# Patient Record
Sex: Female | Born: 1950 | Race: White | Hispanic: No | Marital: Single | Smoking: Former smoker
Health system: Southern US, Community
[De-identification: ages and names within clinical notes are randomized; demographics above are authoritative.]

## PROBLEM LIST (undated history)

## (undated) DIAGNOSIS — R112 Nausea with vomiting, unspecified: Secondary | ICD-10-CM

## (undated) DIAGNOSIS — K219 Gastro-esophageal reflux disease without esophagitis: Secondary | ICD-10-CM

## (undated) DIAGNOSIS — I671 Cerebral aneurysm, nonruptured: Secondary | ICD-10-CM

## (undated) DIAGNOSIS — M310 Hypersensitivity angiitis: Secondary | ICD-10-CM

## (undated) DIAGNOSIS — G510 Bell's palsy: Secondary | ICD-10-CM

## (undated) DIAGNOSIS — N186 End stage renal disease: Secondary | ICD-10-CM

## (undated) DIAGNOSIS — I10 Essential (primary) hypertension: Secondary | ICD-10-CM

## (undated) DIAGNOSIS — E039 Hypothyroidism, unspecified: Secondary | ICD-10-CM

## (undated) DIAGNOSIS — E079 Disorder of thyroid, unspecified: Secondary | ICD-10-CM

## (undated) DIAGNOSIS — M1990 Unspecified osteoarthritis, unspecified site: Secondary | ICD-10-CM

## (undated) DIAGNOSIS — S52502A Unspecified fracture of the lower end of left radius, initial encounter for closed fracture: Secondary | ICD-10-CM

## (undated) DIAGNOSIS — Z9889 Other specified postprocedural states: Secondary | ICD-10-CM

## (undated) HISTORY — PX: EYE SURGERY: SHX253

## (undated) HISTORY — PX: FOOT FRACTURE SURGERY: SHX645

## (undated) HISTORY — PX: BILATERAL CARPAL TUNNEL RELEASE: SHX6508

## (undated) HISTORY — PX: WISDOM TOOTH EXTRACTION: SHX21

## (undated) HISTORY — PX: TONSILLECTOMY: SUR1361

## (undated) HISTORY — DX: Essential (primary) hypertension: I10

## (undated) HISTORY — PX: BUNIONECTOMY: SHX129

## (undated) HISTORY — DX: Disorder of thyroid, unspecified: E07.9

## (undated) HISTORY — PX: BREAST SURGERY: SHX581

## (undated) HISTORY — PX: THYROIDECTOMY: SHX17

## (undated) NOTE — *Deleted (*Deleted)
  NEUROSURGERY PROGRESS NOTE   Patient is currently in HD. MRI/MRA reviewed with Dr Kathyrn Sheriff. MRI without evidence of intracranial hemorrhage. 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 Given lack of changes on MRA, do not believe her unruptured aneurysms

---

## 1997-09-10 ENCOUNTER — Ambulatory Visit (HOSPITAL_COMMUNITY): Admission: RE | Admit: 1997-09-10 | Discharge: 1997-09-10 | Payer: Self-pay | Admitting: *Deleted

## 2002-03-12 ENCOUNTER — Ambulatory Visit (HOSPITAL_BASED_OUTPATIENT_CLINIC_OR_DEPARTMENT_OTHER): Admission: RE | Admit: 2002-03-12 | Discharge: 2002-03-12 | Payer: Self-pay | Admitting: *Deleted

## 2003-03-08 ENCOUNTER — Other Ambulatory Visit: Admission: RE | Admit: 2003-03-08 | Discharge: 2003-03-08 | Payer: Self-pay | Admitting: Geriatric Medicine

## 2003-03-11 ENCOUNTER — Encounter: Admission: RE | Admit: 2003-03-11 | Discharge: 2003-03-11 | Payer: Self-pay | Admitting: Otolaryngology

## 2005-05-26 ENCOUNTER — Other Ambulatory Visit: Admission: RE | Admit: 2005-05-26 | Discharge: 2005-05-26 | Payer: Self-pay | Admitting: Geriatric Medicine

## 2006-02-10 ENCOUNTER — Encounter: Admission: RE | Admit: 2006-02-10 | Discharge: 2006-02-10 | Payer: Self-pay | Admitting: Geriatric Medicine

## 2009-06-26 ENCOUNTER — Other Ambulatory Visit: Admission: RE | Admit: 2009-06-26 | Discharge: 2009-06-26 | Payer: Self-pay | Admitting: Geriatric Medicine

## 2010-01-24 ENCOUNTER — Encounter: Payer: Self-pay | Admitting: Otolaryngology

## 2010-05-20 ENCOUNTER — Other Ambulatory Visit: Payer: Self-pay | Admitting: Geriatric Medicine

## 2010-05-20 ENCOUNTER — Ambulatory Visit
Admission: RE | Admit: 2010-05-20 | Discharge: 2010-05-20 | Disposition: A | Discharge status: Home / Self Care | Payer: BC Managed Care – PPO | Source: Ambulatory Visit | Attending: Geriatric Medicine | Admitting: Geriatric Medicine

## 2010-05-20 DIAGNOSIS — M549 Dorsalgia, unspecified: Secondary | ICD-10-CM

## 2010-05-22 NOTE — Op Note (Signed)
   NAME:  Cristina Wilson, Cristina Wilson                        ACCOUNT NO.:  [id number]   MEDICAL RECORD NO.:  [id number]                   PATIENT TYPE:  AMB   LOCATION:  [place]                                  FACILITY:  [facility]   PHYSICIAN:  Marcello Moores B. March Rummage, M.D.               DATE OF BIRTH:  05/14/1950   DATE OF PROCEDURE:  03/12/2002  DATE OF DISCHARGE:                                 OPERATIVE REPORT   PREOPERATIVE DIAGNOSIS:  Skin lesion, right upper arm.   POSTOPERATIVE DIAGNOSIS:  Skin lesion, right upper arm.   PROCEDURE:  Excision of skin lesion, right upper arm.   SURGEON:  Marcello Moores B. March Rummage, M.D.   ANESTHESIA:  1% Xylocaine local.   PROCEDURE IN DETAIL:  The patient was taken to the minor surgery room where  the right upper arm was prepped and draped in a sterile field.  The patient  had a skin lesion which was nonhealing and seems to be enlarging.  An  elliptical incision was outlined with a skin marker.  Then the 1% Xylocaine  was used to anesthetize the area.  The elliptical incision was made going  down through the full thickness of the skin and removing the underlying fat.  The defect was 1.2 x 2.2 cm.  The wound was then closed with interrupted  sutures of 3-0 Vicryl as subcuticular sutures, and the skin was closed with  interrupted vertical mattress sutures of 4-0 nylon.  A dry sterile dressing  was applied.  The patient seemed to tolerate the procedure well.  The  specimen was sent for routine pathologic results.                                               Thomas B. March Rummage, M.D.    TBP/MEDQ  D:  03/12/2002  T:  03/12/2002  Job:  [id number]

## 2011-08-08 ENCOUNTER — Ambulatory Visit (INDEPENDENT_AMBULATORY_CARE_PROVIDER_SITE_OTHER): Payer: BC Managed Care – PPO | Admitting: Family Medicine

## 2011-08-08 ENCOUNTER — Ambulatory Visit: Payer: BC Managed Care – PPO

## 2011-08-08 VITALS — BP 130/72 | HR 66 | Temp 97.4°F | Resp 12 | Ht 64.25 in | Wt 177.0 lb

## 2011-08-08 DIAGNOSIS — M79609 Pain in unspecified limb: Secondary | ICD-10-CM

## 2011-08-08 DIAGNOSIS — M79673 Pain in unspecified foot: Secondary | ICD-10-CM

## 2011-08-08 NOTE — Progress Notes (Signed)
[facility] [address] [place] [address] [phone]  Date:  08/08/2011   Name:  Cristina Wilson   DOB:  08-29-1950   MRN:  [id number]  PCP:  Mathews Argyle, MD    Chief Complaint: Foot Pain   History of Present Illness:  Cristina Wilson is a 61 y.o. very pleasant female patient who presents with the following:  She was going down some stairs and missed a step- this occurred about 10 days ago.  She rolled over the toes of her right foot, and it turned black and blue and swelled up. She was not able to bear weight at all for a couple of days. She applied ice and took aleve.   She still has pain, and wanted to be sure there is no break.  She did try a post- op shoe that she had at home- it felt good to her foot. She did have bunion surgery several years ago.   Otherwise she is not hurt and has not other complaints today  There is no problem list on file for this patient.   No past medical history on file.  No past surgical history on file.  History  Substance Use Topics  . Smoking status: Former Smoker -- .5 years    Types: Cigarettes    Quit date: 08/08/1991  . Smokeless tobacco: Never Used  . Alcohol Use: Not on file    No family history on file.  Allergies  Allergen Reactions  . Sulfa Antibiotics Rash    Medication list has been reviewed and updated.  Current Outpatient Prescriptions on File Prior to Visit  Medication Sig Dispense Refill  . hydrochlorothiazide (MICROZIDE) 12.5 MG capsule Take 12.5 mg by mouth daily.      Marland Kitchen levothyroxine (SYNTHROID, LEVOTHROID) 112 MCG tablet Take 112 mcg by mouth daily.      Marland Kitchen lisinopril (PRINIVIL,ZESTRIL) 5 MG tablet Take 5 mg by mouth daily.        Review of Systems:  As per HPI- otherwise negative.   Physical Examination: Filed Vitals:   08/08/11 1049  BP: 130/72  Pulse: 66  Temp: 97.4 F (36.3 C)  Resp: 12   Filed Vitals:   08/08/11 1049  Height: 5' 4.25" (1.632 m)  Weight:  177 lb (80.287 kg)   Body mass index is 30.15 kg/(m^2). Ideal Body Weight: Weight in (lb) to have BMI = 25: 146.5    GEN: WDWN, NAD, Non-toxic, Alert & Oriented x 3 HEENT: Atraumatic, Normocephalic.  Ears and Nose: No external deformity. EXTR: No clubbing/cyanosis/edema NEURO: slight limp PSYCH: Normally interactive. Conversant. Not depressed or anxious appearing.  Calm demeanor.  Right foot and ankle:  There is tenderness at distal 4th and 3rd MT near her toes, and inferior to her lateral malleolus.  No bruise or swelling, no heat or redness.    [facility] reading (PRIMARY) by  Dr. Lorelei Pont. Right foot:  Evidence of old internal fixation- otherwise normal Right ankle: negative for fracture  RIGHT ANKLE - COMPLETE 3+ VIEW  Comparison: No priors.  Findings: Three views of the right ankle demonstrate no acute fracture, subluxation, dislocation, joint or soft tissue abnormality. Orthopedic fixation hardware in the distal first and fifth metatarsals are incidentally noted.  IMPRESSION: 1. No acute radiographic abnormality of the right ankle to account for the patient's symptoms.  RIGHT FOOT COMPLETE - 3+ VIEW  Comparison: No priors.  Findings: Postoperative changes of ORIF for noted in the distal first and fifth  metatarsals. No acute displaced fracture, subluxation, dislocation, joint or soft tissue abnormality is appreciated. No lucency around the K-wire in the distal first metatarsal. There is a small amount of lucency adjacent to the head of the screw in the fifth metatarsal.  IMPRESSION: 1. No definite acute radiographic abnormality of the right foot to account for the patient's symptoms. 2. However, there is a small amount of lucency around the head of the screw in the distal fifth metatarsal. Correlation with any prior outside radiographs is recommended. If the patient has point tenderness in this region, this could conceivably represent evidence of infection. Clinical  correlation is recommended.  Assessment and Plan: 1. Pain in foot  DG Ankle Complete Right, DG Foot Complete Right   No apparent fracture- Wear post- op shoe for comfort.  Let me know if not better within a week or so- Sooner if worse.   Received over- read report and called her to discuss item #2 in foot report above.  Her bunion surgery was done by her podiatrist at the [facility].  It is unlikely that she has any acute infection as her symptoms are explained by her fall.  However, I did ask her to have her podiatrist compare her new x-rays with their old films- I do not have access to her old films.  Make her a CD of her films and a print- out of her reports.  She will pick these up and have her podiatrist compare her x-rays this week,      Horice Carrero, MD

## 2012-12-08 ENCOUNTER — Ambulatory Visit: Payer: BC Managed Care – PPO

## 2012-12-26 ENCOUNTER — Ambulatory Visit (INDEPENDENT_AMBULATORY_CARE_PROVIDER_SITE_OTHER): Payer: BC Managed Care – PPO

## 2012-12-26 VITALS — BP 102/61 | HR 59 | Resp 16 | Ht 64.0 in | Wt 180.0 lb

## 2012-12-26 DIAGNOSIS — M79609 Pain in unspecified limb: Secondary | ICD-10-CM

## 2012-12-26 DIAGNOSIS — M79673 Pain in unspecified foot: Secondary | ICD-10-CM

## 2012-12-26 DIAGNOSIS — M722 Plantar fascial fibromatosis: Secondary | ICD-10-CM

## 2012-12-26 MED ORDER — MELOXICAM 15 MG PO TABS: 15.0000 mg | ORAL_TABLET | Freq: Every day | ORAL | Status: DC

## 2012-12-26 NOTE — Patient Instructions (Signed)

## 2012-12-26 NOTE — Progress Notes (Signed)
   Subjective:    Patient ID: Cristina Wilson, female    DOB: Oct 12, 1950, 62 y.o.   MRN: [id number]  "My heels have been bothering me since July, more so the right one than the left."  Foot Pain This is a new (Heel Pain B/L Painful) problem. Episode onset:  July. The problem occurs intermittently. Progression since onset: eased up a little bit. Associated symptoms include myalgias. The symptoms are aggravated by walking (barefoot). She has tried heat, ice and NSAIDs (TENS Unit, stopped walking, Naprosyn, massage therapy,) for the symptoms. The treatment provided mild relief.      Review of Systems  Musculoskeletal: Positive for gait problem and myalgias.  All other systems reviewed and are negative.       Objective:   Physical Exam Neurovascular status is intact. Pedal pulses palpable. Epicritic and proprioceptive sensations intact and symmetric bilateral. Orthopedic biomechanical exam reveals pain on palpation Magan plantar fascia medial trochanter tubercle right heel more significant than left. There is pain on for step in the morning or getting up and after a period of rest. Cannot walk barefoot. Patient currently wears and Finn comfort insoles and uses [employer]. She has had custom orthoses in the past has been doing physical therapy ice stretching massage therapy and prior treatments with temporary relief. Dermatologically findings unremarkable x-rays reveal old fixations from bunion and hammertoe repair right foot and  Jones fracture repair left foot.       Assessment & Plan:  Assessment this time is recalcitrant or recurrent plantar fasciitis/heel spur syndrome right foot more so than left. Plantar fascial strapping applied to both feet at this time also prescription for Mobic 15 mg daily as alternative to naproxen. Patient will followup in 2-3 weeks for reevaluation maintain orthotics recommended crocs for around the house also maintain ice pack as needed may be  candidate for prescription orthoses in the future. Followup visit patient is to bring her orthotics in for evaluation to  Harriet Masson DP

## 2013-01-16 ENCOUNTER — Ambulatory Visit: Payer: BC Managed Care – PPO

## 2013-12-10 ENCOUNTER — Other Ambulatory Visit: Payer: Self-pay | Admitting: Orthopedic Surgery

## 2013-12-19 ENCOUNTER — Other Ambulatory Visit: Payer: Self-pay | Admitting: Dermatology

## 2014-01-10 ENCOUNTER — Encounter: Payer: Self-pay | Admitting: Podiatry

## 2014-01-10 ENCOUNTER — Ambulatory Visit (INDEPENDENT_AMBULATORY_CARE_PROVIDER_SITE_OTHER): Payer: BC Managed Care – PPO | Admitting: Podiatry

## 2014-01-10 VITALS — BP 155/86 | HR 63 | Resp 16

## 2014-01-10 DIAGNOSIS — M722 Plantar fascial fibromatosis: Secondary | ICD-10-CM

## 2014-01-10 MED ORDER — TRIAMCINOLONE ACETONIDE 10 MG/ML IJ SUSP
10.0000 mg | Freq: Once | INTRAMUSCULAR | Status: AC
  Administered 2014-01-10: 10 mg

## 2014-01-11 NOTE — Progress Notes (Signed)
Subjective:     Patient ID: Cristina Wilson, female   DOB: March 31, 1950, 64 y.o.   MRN: [id number]  HPI patient states she continues to have pain in both heels with the left one bothering her more and it has been going on for a while   Review of Systems     Objective:   Physical Exam  Vascular status intact with muscle strength adequate range of motion within normal limits with discomfort in the plantar heels which is quite sore at the medial band insertion left over right    Assessment:     Plantar fasciitis left over right heel    Plan:     Advised on physical therapy supportive shoe gear and today injected the plantar heel bilateral 3 mg Kenalog 5 mg Xylocaine and applied fascially brace to the left heel with instructions is on usage

## 2014-01-17 ENCOUNTER — Ambulatory Visit (INDEPENDENT_AMBULATORY_CARE_PROVIDER_SITE_OTHER): Payer: BC Managed Care – PPO | Admitting: Podiatry

## 2014-01-17 ENCOUNTER — Encounter: Payer: Self-pay | Admitting: Podiatry

## 2014-01-17 VITALS — BP 119/66 | HR 61 | Resp 16

## 2014-01-17 DIAGNOSIS — M722 Plantar fascial fibromatosis: Secondary | ICD-10-CM

## 2014-01-17 NOTE — Progress Notes (Signed)
Subjective:     Patient ID: Cristina Wilson, female   DOB: April 01, 1950, 64 y.o.   MRN: [id number]  HPI patient states that my heels are feeling much better with the injections but I'm still having pain right over left and it has been going on a long time   Review of Systems     Objective:   Physical Exam Neurovascular status intact with muscle strength adequate and range of motion within normal limits. Continues to have moderate discomfort when the plantar heel right is pressed with mild discomfort on the left and does have mechanical dysfunction function with moderate depression of the arch upon weightbearing    Assessment:     Chronic plantar fasciitis still noted with depression of the arch is part of the pathological process    Plan:     Advised on physical therapy and at this time casted and scanned for custom orthotics to reduce the long-term stress on the heels. Discussed shoe gear modifications and reappoint when orthotics are returned

## 2014-02-11 ENCOUNTER — Other Ambulatory Visit: Payer: BC Managed Care – PPO

## 2014-02-14 ENCOUNTER — Ambulatory Visit (INDEPENDENT_AMBULATORY_CARE_PROVIDER_SITE_OTHER): Payer: BC Managed Care – PPO | Admitting: *Deleted

## 2014-02-14 DIAGNOSIS — M722 Plantar fascial fibromatosis: Secondary | ICD-10-CM

## 2014-02-14 NOTE — Patient Instructions (Signed)

## 2014-02-14 NOTE — Progress Notes (Signed)
PICKING UP MY INSERTS

## 2014-04-05 ENCOUNTER — Other Ambulatory Visit (HOSPITAL_COMMUNITY)
Admission: RE | Admit: 2014-04-05 | Discharge: 2014-04-05 | Disposition: A | Discharge status: Home / Self Care | Payer: BC Managed Care – PPO | Source: Ambulatory Visit | Attending: Geriatric Medicine | Admitting: Geriatric Medicine

## 2014-04-05 ENCOUNTER — Other Ambulatory Visit: Payer: Self-pay | Admitting: Geriatric Medicine

## 2014-04-05 DIAGNOSIS — Z1151 Encounter for screening for human papillomavirus (HPV): Secondary | ICD-10-CM | POA: Diagnosis present

## 2014-04-05 DIAGNOSIS — Z01419 Encounter for gynecological examination (general) (routine) without abnormal findings: Secondary | ICD-10-CM | POA: Diagnosis not present

## 2014-04-09 LAB — CYTOLOGY - PAP

## 2014-08-05 DIAGNOSIS — I671 Cerebral aneurysm, nonruptured: Secondary | ICD-10-CM

## 2014-08-05 HISTORY — DX: Cerebral aneurysm, nonruptured: I67.1

## 2014-08-07 ENCOUNTER — Encounter (HOSPITAL_COMMUNITY): Payer: BC Managed Care – PPO

## 2014-08-07 ENCOUNTER — Other Ambulatory Visit (HOSPITAL_COMMUNITY): Payer: Self-pay | Admitting: Geriatric Medicine

## 2014-08-07 ENCOUNTER — Other Ambulatory Visit (HOSPITAL_COMMUNITY): Payer: Self-pay | Admitting: Interventional Radiology

## 2014-08-07 DIAGNOSIS — R51 Headache: Principal | ICD-10-CM

## 2014-08-07 DIAGNOSIS — I671 Cerebral aneurysm, nonruptured: Secondary | ICD-10-CM

## 2014-08-07 DIAGNOSIS — R519 Headache, unspecified: Secondary | ICD-10-CM

## 2014-08-07 DIAGNOSIS — I639 Cerebral infarction, unspecified: Secondary | ICD-10-CM

## 2014-08-09 ENCOUNTER — Ambulatory Visit (HOSPITAL_COMMUNITY)
Admission: RE | Admit: 2014-08-09 | Discharge: 2014-08-09 | Disposition: A | Discharge status: Home / Self Care | Payer: BC Managed Care – PPO | Source: Ambulatory Visit | Attending: Interventional Radiology | Admitting: Interventional Radiology

## 2014-08-09 DIAGNOSIS — I639 Cerebral infarction, unspecified: Secondary | ICD-10-CM

## 2014-08-09 DIAGNOSIS — I671 Cerebral aneurysm, nonruptured: Secondary | ICD-10-CM

## 2014-08-12 ENCOUNTER — Other Ambulatory Visit (HOSPITAL_COMMUNITY): Payer: Self-pay | Admitting: Interventional Radiology

## 2014-08-12 ENCOUNTER — Telehealth (HOSPITAL_COMMUNITY): Payer: Self-pay | Admitting: Interventional Radiology

## 2014-08-12 DIAGNOSIS — I671 Cerebral aneurysm, nonruptured: Secondary | ICD-10-CM

## 2014-08-12 NOTE — Telephone Encounter (Signed)
Spoke to pt this morning concerning her prescription for her Plavix 75mg  given to her by Dr. Estanislado Pandy at her consult on Friday 08/09/14. She stated that he did NOT write the medication name on the prescription and the pharmacy was trying to reach our office to have this corrected. The patient was crying and yelling hysterically over this. I spoke with her at great length and assured her that we would contact her pharmacy and fix this error ASAP. The pharmacy was called and I spoke directly to the pharmacist and asked him what the problem was with the patient's prescription. He stated that the prescription that was brought in did not have a medication name on it at all but only said take PO b.i.d. That too was incorrect information. I explained to him that the patient's dictation from her consultation visit and the doctor's hand written notes from her visit states that she is to start Plavix 75mg  1 tablet daily x7 days prior to her scheduled procedure. I asked the pharmacist to fax me a copy of the written script so that I could speak to Deveshwar about it, which he did fax over. I called that patient back after talking w/ the pharmacist and let her know that her medication confusion had been cleared up and she would be able to pick her medication up today. She stated understanding and agreement with this. She will come back in for another consultation (per her request) on 09/02/14. JM

## 2014-08-22 ENCOUNTER — Ambulatory Visit: Payer: Self-pay

## 2014-08-22 ENCOUNTER — Ambulatory Visit (INDEPENDENT_AMBULATORY_CARE_PROVIDER_SITE_OTHER): Payer: BC Managed Care – PPO | Admitting: Podiatry

## 2014-08-22 ENCOUNTER — Encounter: Payer: Self-pay | Admitting: Podiatry

## 2014-08-22 VITALS — BP 124/67 | HR 58 | Resp 16

## 2014-08-22 DIAGNOSIS — M79671 Pain in right foot: Secondary | ICD-10-CM | POA: Diagnosis not present

## 2014-08-22 DIAGNOSIS — M722 Plantar fascial fibromatosis: Secondary | ICD-10-CM

## 2014-08-22 DIAGNOSIS — Z0189 Encounter for other specified special examinations: Secondary | ICD-10-CM

## 2014-08-22 NOTE — Progress Notes (Signed)
Subjective:     Patient ID: Cristina Wilson, female   DOB: 26-Jul-1950, 64 y.o.   MRN: [id number]  HPI patient states that I have had a reoccurrence of my heel pain and I'm getting some numbness on my foot the top right over left. 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 Ellene Route to discuss that is due to have it done at the radiologist office   Review of Systems     Objective:   Physical Exam Neurovascular status intact with no change in health history and noted to have discomfort in the plantar aspect of the right over left heel with inflammation and mild numbness into the arch right over left and dorsum of the foot right over left with no specific pattern    Assessment:     Reoccurrence of significant plantar fascial symptomatology right along with possible issues with her back which may be causing numbing-like sensations    Plan:     X-rays of both feet were reviewed discussing the symptoms she is experiencing and I went ahead and discussed long-term shockwave for the right heel which I think would be of benefit. Patient wants to do this but needs to hold off and I dispensed a air fracture walker to use now and also to use for after shockwave. Gave instructions on how to use it and also discussed that she talk to Dr. Ellene Route concerning her numbness

## 2014-09-02 ENCOUNTER — Telehealth (HOSPITAL_COMMUNITY): Payer: Self-pay | Admitting: Interventional Radiology

## 2014-09-02 ENCOUNTER — Other Ambulatory Visit (HOSPITAL_COMMUNITY): Payer: BC Managed Care – PPO

## 2014-09-02 NOTE — Telephone Encounter (Signed)
Called pt, left VM asking her to call me back. I told her that I would like to discuss, if she did not mind, the reason she called and canceled her upcoming intervention. Told her that we certainly hated that she was feeling the way she stated that she was feeling. JM

## 2014-09-11 ENCOUNTER — Ambulatory Visit (HOSPITAL_COMMUNITY): Admission: RE | Admit: 2014-09-11 | Payer: BC Managed Care – PPO | Source: Ambulatory Visit

## 2014-09-13 ENCOUNTER — Encounter: Payer: Self-pay | Admitting: Podiatry

## 2014-09-13 ENCOUNTER — Ambulatory Visit (INDEPENDENT_AMBULATORY_CARE_PROVIDER_SITE_OTHER): Payer: BC Managed Care – PPO | Admitting: Podiatry

## 2014-09-13 VITALS — BP 124/70 | HR 60 | Resp 16

## 2014-09-13 DIAGNOSIS — M79671 Pain in right foot: Secondary | ICD-10-CM

## 2014-09-13 DIAGNOSIS — M722 Plantar fascial fibromatosis: Secondary | ICD-10-CM

## 2014-09-16 NOTE — Progress Notes (Signed)
Subjective:     Patient ID: Cristina Wilson, female   DOB: 05/07/1950, 64 y.o.   MRN: [id number]  HPI patient presents stating I'm still having this problem with my heel on my right foot and it's been bothering me now for a long time and does not seem to respond to the injections   Review of Systems     Objective:   Physical Exam Neurovascular status intact muscle strength adequate range of motion within normal limits and patient noted to have continued discomfort plantar fascia right at the insertional point tendon into the calcaneus with fluid buildup still noted    Assessment:     Chronic plantar fasciitis right    Plan:     Discussed condition and recommended at this time utilization of shockwave. Reviewed shockwave therapy and discussed the positive element of it and patient wants to have this type of procedure done and she was initiated today at 3000 shocks 17 frequency 3.2 on intensity. Reappoint one week

## 2014-09-20 ENCOUNTER — Encounter: Payer: Self-pay | Admitting: Podiatry

## 2014-09-20 ENCOUNTER — Ambulatory Visit (INDEPENDENT_AMBULATORY_CARE_PROVIDER_SITE_OTHER): Payer: BC Managed Care – PPO | Admitting: Podiatry

## 2014-09-20 DIAGNOSIS — M722 Plantar fascial fibromatosis: Secondary | ICD-10-CM

## 2014-09-20 DIAGNOSIS — M79673 Pain in unspecified foot: Secondary | ICD-10-CM

## 2014-09-22 NOTE — Progress Notes (Signed)
Subjective:     Patient ID: Cristina Wilson, female   DOB: 25-Nov-1950, 64 y.o.   MRN: [id number]  HPI patient states my right heel is still sore   Review of Systems     Objective:   Physical Exam Neurovascular status unchanged with pain in the right heel moderately better than previous    Assessment:     Plantar fasciitis slightly improved present    Plan:     Shockwave administered approximate 3.8 on intensity 3000 shocks 17 frequency

## 2014-09-27 ENCOUNTER — Ambulatory Visit (INDEPENDENT_AMBULATORY_CARE_PROVIDER_SITE_OTHER): Payer: BC Managed Care – PPO | Admitting: Podiatry

## 2014-09-27 ENCOUNTER — Encounter: Payer: Self-pay | Admitting: Podiatry

## 2014-09-27 DIAGNOSIS — M722 Plantar fascial fibromatosis: Secondary | ICD-10-CM

## 2014-09-29 NOTE — Progress Notes (Signed)
Subjective:     Patient ID: Cristina Wilson, female   DOB: [birth year], 64 y.o.   MRN: [id number]  HPI patient presents with pain in the right heel still present with patient being frustrated at this time   Review of Systems     Objective:   Physical Exam Neurovascular status intact with continued discomfort plantar fascial right    Assessment:     Resistant plantar fasciitis right    Plan:     Advised on physical therapy for this and shockwave administered 3000 shocks 4.2 intensity 17 frequency

## 2014-10-02 ENCOUNTER — Ambulatory Visit: Payer: BC Managed Care – PPO | Admitting: Podiatry

## 2014-10-02 ENCOUNTER — Telehealth: Payer: Self-pay | Admitting: *Deleted

## 2014-10-02 NOTE — Telephone Encounter (Addendum)
Pt called yesterday complaining of sudden sharp pain in the heel of the foot being treated by the EPAT process, pt states the pain was not there before, pt asks is if the pain is normal.  Dr. Mellody Drown orders called to pt, explained it may be a flare up and the ice and boot should help.  Pt states understanding and will see [place] in November.

## 2014-10-02 NOTE — Telephone Encounter (Signed)
Hard to say. Should use ice and try to wear boot as much as possible

## 2014-10-30 ENCOUNTER — Other Ambulatory Visit: Payer: Self-pay

## 2014-10-30 ENCOUNTER — Encounter: Payer: Self-pay | Admitting: Vascular Surgery

## 2014-10-30 ENCOUNTER — Encounter (HOSPITAL_COMMUNITY): Payer: Self-pay | Admitting: *Deleted

## 2014-10-30 ENCOUNTER — Ambulatory Visit (INDEPENDENT_AMBULATORY_CARE_PROVIDER_SITE_OTHER): Payer: BC Managed Care – PPO | Admitting: Vascular Surgery

## 2014-10-30 VITALS — BP 136/85 | HR 69 | Temp 97.4°F | Resp 14 | Ht 64.0 in | Wt 177.0 lb

## 2014-10-30 DIAGNOSIS — H5711 Ocular pain, right eye: Secondary | ICD-10-CM | POA: Diagnosis not present

## 2014-10-30 DIAGNOSIS — G4489 Other headache syndrome: Secondary | ICD-10-CM | POA: Diagnosis not present

## 2014-10-30 DIAGNOSIS — R519 Headache, unspecified: Secondary | ICD-10-CM | POA: Insufficient documentation

## 2014-10-30 DIAGNOSIS — R51 Headache: Secondary | ICD-10-CM

## 2014-10-30 DIAGNOSIS — H5710 Ocular pain, unspecified eye: Secondary | ICD-10-CM | POA: Insufficient documentation

## 2014-10-30 HISTORY — DX: Headache, unspecified: R51.9

## 2014-10-30 MED ORDER — CEFUROXIME SODIUM 1.5 G IJ SOLR
1.5000 g | INTRAMUSCULAR | Status: AC
  Administered 2014-10-31: 1.5 g via INTRAVENOUS
  Filled 2014-10-30: qty 1.5

## 2014-10-30 MED ORDER — CHLORHEXIDINE GLUCONATE CLOTH 2 % EX PADS: 6.0000 | MEDICATED_PAD | Freq: Once | CUTANEOUS | Status: DC

## 2014-10-30 MED ORDER — SODIUM CHLORIDE 0.9 % IV SOLN: INTRAVENOUS | Status: DC

## 2014-10-30 NOTE — Progress Notes (Signed)
Pt denies cardiac history, chest pain or sob. Dr. Lajean Manes is her PCP.  Pt states she does get very nauseated with vomiting after surgeries.

## 2014-10-30 NOTE — Progress Notes (Signed)
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 ([birth year]) female who presents with chief complaint: right occipital headache with pain R eye.  Patient has had onset of R posterior headache with dizziness and Bell's palsy since August.  She notes some dizziness and R eye blurriness with eyelid "spasming" related to these headaches.  She was recently seen by Opthalmology which started her on steroid with thought of temporal arteritis.  She is referred her for a temporal artery biopsy.  Past Medical History  Diagnosis Date  . Hypertension   . Thyroid disease   Intracranial aneurysms  Past Surgical History  Procedure Laterality Date  . Thyroidectomy    . Wisdom tooth extraction      Social History   Social History  . Marital Status: Single    Spouse Name: N/A  . Number of Children: N/A  . Years of Education: N/A   Occupational History  . Not on file.   Social History Main Topics  . Smoking status: Former Smoker -- .5 years    Types: Cigarettes    Quit date: 08/07/1989  . Smokeless tobacco: Never Used  . Alcohol Use: 0.0 oz/week    0 Standard drinks or equivalent per week     Comment: a glass a night, some nights more  . Drug Use: No  . Sexual Activity: Not on file   Other Topics Concern  . Not on file   Social History Narrative    Family History  Problem Relation Age of Onset  . COPD Mother   . Kidney disease Mother   . Heart disease Mother   . Cancer Father     Current Outpatient Prescriptions  Medication Sig Dispense Refill  . aspirin 81 MG tablet Take 81 mg by mouth daily.    . hydrochlorothiazide (MICROZIDE) 12.5 MG capsule Take 12.5 mg by mouth daily.    Marland Kitchen levothyroxine (SYNTHROID, LEVOTHROID) 112 MCG tablet Take 112 mcg by mouth daily.    Marland Kitchen lisinopril  (PRINIVIL,ZESTRIL) 5 MG tablet Take 5 mg by mouth daily.    Marland Kitchen loratadine (CLARITIN) 10 MG tablet Take 10 mg by mouth daily.    . traZODone (DESYREL) 50 MG tablet Take 50 mg by mouth at bedtime.    . clopidogrel (PLAVIX) 75 MG tablet     . famotidine (PEPCID) 20 MG tablet     . levothyroxine (SYNTHROID, LEVOTHROID) 125 MCG tablet     . predniSONE (DELTASONE) 20 MG tablet     . RESTASIS 0.05 % ophthalmic emulsion      No current facility-administered medications for this visit.     Allergies  Allergen Reactions  . Sulfa Antibiotics Rash     REVIEW OF SYSTEMS:  (Positives checked otherwise negative)  CARDIOVASCULAR:   '[ ]'  chest pain,  '[ ]'  chest pressure,  '[ ]'  palpitations,  '[ ]'  shortness of breath when laying flat,  '[ ]'  shortness of breath with exertion,   '[ ]'  pain in feet when walking,  '[ ]'  pain in feet when laying flat, '[ ]'  history of blood clot in veins (DVT),  '[ ]'  history of phlebitis,  '[ ]'  swelling in legs,  '[ ]'  varicose veins  PULMONARY:   '[ ]'  productive cough,  '[ ]'  asthma,  '[ ]'  wheezing  NEUROLOGIC:   '[ ]'  weakness in arms or legs,  '[ ]'  numbness in arms or legs,  '[ ]'  difficulty speaking or slurred speech,  '[ ]'  temporary loss of vision in one eye,  '[x]'  dizziness '[x]'  R eye pain '[x]'  Headache  HEMATOLOGIC:   '[ ]'  bleeding problems,  '[ ]'  problems with blood clotting too easily  MUSCULOSKEL:   '[ ]'  joint pain, '[ ]'  joint swelling  GASTROINTEST:   '[ ]'  vomiting blood,  '[ ]'  blood in stool     GENITOURINARY:   '[ ]'  burning with urination,  '[ ]'  blood in urine  PSYCHIATRIC:   '[ ]'  history of major depression  INTEGUMENTARY:   '[ ]'  rashes,  '[ ]'  ulcers  CONSTITUTIONAL:   '[ ]'  fever,  '[ ]'  chills   Physical Examination  Filed Vitals:   10/30/14 0911 10/30/14 0912  BP: 129/76 136/85  Pulse: 69 69  Temp: 97.4 F (36.3 C)   Resp: 14   Height: '5\' 4"'  (1.626 m)   Weight: 177 lb (80.287 kg)   SpO2: 100%    Body mass index is 30.37 kg/(m^2).  General: A&O  x 3, WDWN  Head: [place]/AT, mild TTP R posterior temporal, no prominent temporal pulse  Ear/Nose/Throat: Hearing grossly intact, nares w/o erythema or drainage, oropharynx w/o Erythema/Exudate, Mallampati score: 4  Eyes: PERRLA, EOMI  Neck: Supple, no nuchal rigidity, no palpable LAD  Pulmonary: Sym exp, good air movt, CTAB, no rales, rhonchi, & wheezing  Cardiac: RRR, Nl S1, S2, no Murmurs, rubs or gallops  Vascular: Vessel Right Left  Radial Palpable Palpable  Brachial Palpable Palpable   Gastrointestinal: soft, NTND, no G/R, no HSM, no masses, no CVAT B  Musculoskeletal: M/S 5/5 throughout , Extremities without ischemic changes   Neurologic: CN 2-12 intact except mild R mouth angle droop, Pain and light touch intact in extremities, Motor exam as listed above  Psychiatric: Judgment intact, Mood & affect appropriate for pt's clinical situation, anxious  Outside Studies/Documentation 3 pages of outside documents were reviewed including: outside labs and Opth note.  Labs ESR 30 CRP 3.0 CBC WNL except PLT 475   Medical Decision Making  Cristina Wilson is a 64 y.o. female who presents with: Headache, R eye pain.   I think her sx are atypical for temporal arteritis but her Opthalmologist was concerned enough to start her on steroids.  Her labs are also non-diagnostic as usual.  Will arrange for temporal artery biopsy as requested by Opthalmology though my pre-test suspicion is low.  A positive biopsy will greatly impact the intensity of steroid therapy, so I think the test is still indicated.  The patient is aware that risk include but are not limited to: bleeding, infection, and nerve damage.    Dr. Kellie Simmering will do the procedure for her tomorrow.  Thank you for allowing [place] to participate in this patient's care.   Adele Barthel, MD [facility] Office: [phone] Pager: [phone]  10/30/2014, 10:59 AM

## 2014-10-31 ENCOUNTER — Ambulatory Visit (HOSPITAL_COMMUNITY)
Admission: RE | Admit: 2014-10-31 | Discharge: 2014-10-31 | Disposition: A | Discharge status: Home / Self Care | Payer: BC Managed Care – PPO | Source: Ambulatory Visit | Attending: Vascular Surgery | Admitting: Vascular Surgery

## 2014-10-31 ENCOUNTER — Encounter (HOSPITAL_COMMUNITY)
Admission: RE | Disposition: A | Discharge status: Home / Self Care | Payer: Self-pay | Source: Ambulatory Visit | Attending: Vascular Surgery

## 2014-10-31 ENCOUNTER — Ambulatory Visit (HOSPITAL_COMMUNITY): Payer: BC Managed Care – PPO | Admitting: Certified Registered"

## 2014-10-31 ENCOUNTER — Encounter (HOSPITAL_COMMUNITY): Payer: Self-pay | Admitting: *Deleted

## 2014-10-31 DIAGNOSIS — Z79899 Other long term (current) drug therapy: Secondary | ICD-10-CM | POA: Insufficient documentation

## 2014-10-31 DIAGNOSIS — I739 Peripheral vascular disease, unspecified: Secondary | ICD-10-CM | POA: Insufficient documentation

## 2014-10-31 DIAGNOSIS — E039 Hypothyroidism, unspecified: Secondary | ICD-10-CM | POA: Insufficient documentation

## 2014-10-31 DIAGNOSIS — Z7982 Long term (current) use of aspirin: Secondary | ICD-10-CM | POA: Insufficient documentation

## 2014-10-31 DIAGNOSIS — I10 Essential (primary) hypertension: Secondary | ICD-10-CM | POA: Insufficient documentation

## 2014-10-31 DIAGNOSIS — K219 Gastro-esophageal reflux disease without esophagitis: Secondary | ICD-10-CM | POA: Insufficient documentation

## 2014-10-31 DIAGNOSIS — Z87891 Personal history of nicotine dependence: Secondary | ICD-10-CM | POA: Insufficient documentation

## 2014-10-31 DIAGNOSIS — Z419 Encounter for procedure for purposes other than remedying health state, unspecified: Secondary | ICD-10-CM

## 2014-10-31 DIAGNOSIS — R51 Headache: Secondary | ICD-10-CM | POA: Diagnosis not present

## 2014-10-31 HISTORY — DX: Gastro-esophageal reflux disease without esophagitis: K21.9

## 2014-10-31 HISTORY — DX: Nausea with vomiting, unspecified: R11.2

## 2014-10-31 HISTORY — DX: Bell's palsy: G51.0

## 2014-10-31 HISTORY — PX: ARTERY BIOPSY: SHX891

## 2014-10-31 HISTORY — DX: Cerebral aneurysm, nonruptured: I67.1

## 2014-10-31 HISTORY — DX: Other specified postprocedural states: Z98.890

## 2014-10-31 LAB — POCT I-STAT 4, (NA,K, GLUC, HGB,HCT)
Glucose, Bld: 110 mg/dL — ABNORMAL HIGH (ref 65–99)
HCT: 40 % (ref 36.0–46.0)
Hemoglobin: 13.6 g/dL (ref 12.0–15.0)
Potassium: 3.3 mmol/L — ABNORMAL LOW (ref 3.5–5.1)
Sodium: 139 mmol/L (ref 135–145)

## 2014-10-31 SURGERY — BIOPSY TEMPORAL ARTERY
Anesthesia: Monitor Anesthesia Care | Site: Head | Laterality: Right

## 2014-10-31 MED ORDER — SUCCINYLCHOLINE CHLORIDE 20 MG/ML IJ SOLN
INTRAMUSCULAR | Status: AC
  Filled 2014-10-31: qty 1

## 2014-10-31 MED ORDER — ACETAMINOPHEN 160 MG/5ML PO SOLN
325.0000 mg | ORAL | Status: DC | PRN
  Filled 2014-10-31: qty 20.3

## 2014-10-31 MED ORDER — PROPOFOL 10 MG/ML IV BOLUS
INTRAVENOUS | Status: AC
  Filled 2014-10-31: qty 20

## 2014-10-31 MED ORDER — LIDOCAINE HCL (CARDIAC) 20 MG/ML IV SOLN
INTRAVENOUS | Status: AC
  Filled 2014-10-31: qty 5

## 2014-10-31 MED ORDER — LIDOCAINE HCL (PF) 1 % IJ SOLN
INTRAMUSCULAR | Status: DC | PRN
  Administered 2014-10-31: 3 mL

## 2014-10-31 MED ORDER — ACETAMINOPHEN 325 MG PO TABS: 325.0000 mg | ORAL_TABLET | ORAL | Status: DC | PRN

## 2014-10-31 MED ORDER — PHENYLEPHRINE HCL 10 MG/ML IJ SOLN
INTRAMUSCULAR | Status: AC
  Filled 2014-10-31: qty 1

## 2014-10-31 MED ORDER — DEXAMETHASONE SODIUM PHOSPHATE 10 MG/ML IJ SOLN
INTRAMUSCULAR | Status: AC
  Filled 2014-10-31: qty 1

## 2014-10-31 MED ORDER — KETAMINE HCL 10 MG/ML IJ SOLN
INTRAMUSCULAR | Status: DC | PRN
  Administered 2014-10-31 (×2): 25 mg via INTRAVENOUS

## 2014-10-31 MED ORDER — OXYCODONE-ACETAMINOPHEN 5-325 MG PO TABS: 1.0000 | ORAL_TABLET | Freq: Four times a day (QID) | ORAL | Status: DC | PRN

## 2014-10-31 MED ORDER — ONDANSETRON HCL 4 MG/2ML IJ SOLN
INTRAMUSCULAR | Status: AC
  Administered 2014-10-31: 4 mg via INTRAVENOUS
  Filled 2014-10-31: qty 4

## 2014-10-31 MED ORDER — KETAMINE HCL 100 MG/ML IJ SOLN
INTRAMUSCULAR | Status: AC
  Filled 2014-10-31: qty 1

## 2014-10-31 MED ORDER — FENTANYL CITRATE (PF) 100 MCG/2ML IJ SOLN: 25.0000 ug | INTRAMUSCULAR | Status: DC | PRN

## 2014-10-31 MED ORDER — 0.9 % SODIUM CHLORIDE (POUR BTL) OPTIME
TOPICAL | Status: DC | PRN
Start: 2014-10-31 — End: 2014-10-31
  Administered 2014-10-31: 1000 mL

## 2014-10-31 MED ORDER — LIDOCAINE HCL (CARDIAC) 20 MG/ML IV SOLN
INTRAVENOUS | Status: DC | PRN
  Administered 2014-10-31: 50 mg via INTRAVENOUS

## 2014-10-31 MED ORDER — LACTATED RINGERS IV SOLN
INTRAVENOUS | Status: DC | PRN
  Administered 2014-10-31: 07:00:00 via INTRAVENOUS

## 2014-10-31 MED ORDER — DEXAMETHASONE SODIUM PHOSPHATE 4 MG/ML IJ SOLN
INTRAMUSCULAR | Status: DC | PRN
  Administered 2014-10-31: 10 mg via INTRAVENOUS

## 2014-10-31 MED ORDER — PROPOFOL 10 MG/ML IV BOLUS
INTRAVENOUS | Status: DC | PRN
  Administered 2014-10-31: 20 mg via INTRAVENOUS
  Administered 2014-10-31: 30 mg via INTRAVENOUS
  Administered 2014-10-31: 50 mg via INTRAVENOUS

## 2014-10-31 MED ORDER — LIDOCAINE HCL (PF) 1 % IJ SOLN
INTRAMUSCULAR | Status: AC
  Filled 2014-10-31: qty 30

## 2014-10-31 MED ORDER — LIDOCAINE HCL (CARDIAC) 20 MG/ML IV SOLN: INTRAVENOUS | Status: DC | PRN

## 2014-10-31 MED ORDER — MIDAZOLAM HCL 2 MG/2ML IJ SOLN
INTRAMUSCULAR | Status: AC
  Filled 2014-10-31: qty 4

## 2014-10-31 SURGICAL SUPPLY — 27 items
CANISTER SUCTION 2500CC (MISCELLANEOUS) ×2 IMPLANT
CONT SPEC 4OZ CLIKSEAL STRL BL (MISCELLANEOUS) ×2 IMPLANT
COTTONBALL LRG STERILE PKG (GAUZE/BANDAGES/DRESSINGS) ×2 IMPLANT
DECANTER SPIKE VIAL GLASS SM (MISCELLANEOUS) ×2 IMPLANT
DRAPE LAPAROTOMY T 102X78X121 (DRAPES) ×2 IMPLANT
ELECT REM PT RETURN 9FT ADLT (ELECTROSURGICAL) ×2
ELECTRODE REM PT RTRN 9FT ADLT (ELECTROSURGICAL) ×1 IMPLANT
GEL ULTRASOUND 20GR AQUASONIC (MISCELLANEOUS) ×2 IMPLANT
GLOVE SS BIOGEL STRL SZ 7 (GLOVE) ×1 IMPLANT
GLOVE SUPERSENSE BIOGEL SZ 7 (GLOVE) ×1
GOWN STRL REUS W/ TWL LRG LVL3 (GOWN DISPOSABLE) ×3 IMPLANT
GOWN STRL REUS W/TWL LRG LVL3 (GOWN DISPOSABLE) ×3
KIT BASIN OR (CUSTOM PROCEDURE TRAY) ×2 IMPLANT
KIT ROOM TURNOVER OR (KITS) ×2 IMPLANT
LIQUID BAND (GAUZE/BANDAGES/DRESSINGS) ×2 IMPLANT
NEEDLE HYPO 25GX1X1/2 BEV (NEEDLE) ×2 IMPLANT
NS IRRIG 1000ML POUR BTL (IV SOLUTION) ×2 IMPLANT
PACK GENERAL/GYN (CUSTOM PROCEDURE TRAY) ×2 IMPLANT
PAD ARMBOARD 7.5X6 YLW CONV (MISCELLANEOUS) ×4 IMPLANT
SPONGE LAP 4X18 X RAY DECT (DISPOSABLE) ×2 IMPLANT
SUCTION FRAZIER TIP 10 FR DISP (SUCTIONS) ×2 IMPLANT
SUT PROLENE 6 0 BV (SUTURE) IMPLANT
SUT SILK 3 0 (SUTURE) ×1
SUT SILK 3-0 18XBRD TIE 12 (SUTURE) ×1 IMPLANT
SUT VICRYL 4-0 PS2 18IN ABS (SUTURE) ×2 IMPLANT
SYR CONTROL 10ML LL (SYRINGE) ×2 IMPLANT
WATER STERILE IRR 1000ML POUR (IV SOLUTION) ×2 IMPLANT

## 2014-10-31 NOTE — Anesthesia Preprocedure Evaluation (Addendum)
Anesthesia Evaluation  Patient identified by MRN, date of birth, ID band  Reviewed: Allergy & Precautions, NPO status , Patient's Chart, lab work & pertinent test results  History of Anesthesia Complications (+) PONV and history of anesthetic complications  Airway Mallampati: II  TM Distance: >3 FB Neck ROM: Full    Dental  (+) Teeth Intact   Pulmonary neg shortness of breath, neg sleep apnea, neg COPD, neg recent URI, former smoker,    breath sounds clear to auscultation       Cardiovascular hypertension, Pt. on medications (-) angina+ Peripheral Vascular Disease  (-) Past MI and (-) CHF  Rhythm:Regular     Neuro/Psych  Headaches, neg Seizures  Neuromuscular disease negative psych ROS   GI/Hepatic Neg liver ROS, GERD  Medicated and Controlled,  Endo/Other  Hypothyroidism   Renal/GU negative Renal ROS     Musculoskeletal   Abdominal   Peds  Hematology negative hematology ROS (+)   Anesthesia Other Findings   Reproductive/Obstetrics                            Anesthesia Physical Anesthesia Plan  ASA: II  Anesthesia Plan: MAC   Post-op Pain Management:    Induction: Intravenous  Airway Management Planned: Nasal Cannula  Additional Equipment: None  Intra-op Plan:   Post-operative Plan:   Informed Consent: I have reviewed the patients History and Physical, chart, labs and discussed the procedure including the risks, benefits and alternatives for the proposed anesthesia with the patient or authorized representative who has indicated his/her understanding and acceptance.   Dental advisory given  Plan Discussed with: CRNA and Surgeon  Anesthesia Plan Comments:         Anesthesia Quick Evaluation

## 2014-10-31 NOTE — Anesthesia Postprocedure Evaluation (Signed)
  Anesthesia Post-op Note  Patient: Cristina Wilson  Procedure(s) Performed: Procedure(s): BIOPSY TEMPORAL ARTERY-RIGHT (Right)  Patient Location: PACU  Anesthesia Type:MAC  Level of Consciousness: awake  Airway and Oxygen Therapy: Patient Spontanous Breathing  Post-op Pain: none  Post-op Assessment: Post-op Vital signs reviewed, Patient's Cardiovascular Status Stable, Respiratory Function Stable, Patent Airway, No signs of Nausea or vomiting and Pain level controlled              Post-op Vital Signs: Reviewed and stable  Last Vitals:  Filed Vitals:   10/31/14 0834  BP:   Pulse: 71  Temp: 36.6 C  Resp: 15    Complications: No apparent anesthesia complications

## 2014-10-31 NOTE — Transfer of Care (Signed)
Immediate Anesthesia Transfer of Care Note  Patient: Cristina Wilson  Procedure(s) Performed: Procedure(s): BIOPSY TEMPORAL ARTERY-RIGHT (Right)  Patient Location: PACU  Anesthesia Type:MAC  Level of Consciousness: awake, alert , oriented and patient cooperative  Airway & Oxygen Therapy: Patient Spontanous Breathing  Post-op Assessment: Report given to RN, Post -op Vital signs reviewed and stable and Patient moving all extremities  Post vital signs: Reviewed and stable  Last Vitals:  Filed Vitals:   10/31/14 0551  BP: 118/71  Pulse: 66  Temp: 35.9 C  Resp: 16    Complications: No apparent anesthesia complications

## 2014-10-31 NOTE — Op Note (Signed)
OPERATIVE REPORT  Date of Surgery: 10/31/2014  Surgeon: Tinnie Gens, MD  Assistant: Nurse  Pre-op Diagnosis: Headaches R51-rule out temporal arteritis  Post-op Diagnosis: Headaches R51  Procedure: Procedure(s): BIOPSY TEMPORAL ARTERY-RIGHT  Anesthesia: Mac  EBL: Minimal  Complications: None  Procedure Details: The patient was taken operating room placed in supine position at which time the right temporal area was exposed to his prepped with Betadine scrub and solution draped in routine sterile manner. The superficial temporal artery was then localized with the Doppler after infiltration with 1% Xylocaine With Epinephrine short longitudinal incision was made anterior to the ear and carried down subcutaneous tissue. The superficial temporal artery was a small vessel. It was identified and a 1 cm segment removed ligated proximally and distally with 4-0 silk tie. It did not appear thickened grossly. There was no Doppler flow following removal of this segment. Adequate hemostasis was achieved wound closed in layers with Vicryl in subcuticular fashion with Dermabond patient taken to recovery in satisfactory condition   Tinnie Gens, MD 10/31/2014 8:30 AM

## 2014-11-01 ENCOUNTER — Encounter (HOSPITAL_COMMUNITY): Payer: Self-pay | Admitting: Vascular Surgery

## 2014-11-01 ENCOUNTER — Encounter: Payer: Self-pay | Admitting: Optometry

## 2014-11-11 ENCOUNTER — Ambulatory Visit (INDEPENDENT_AMBULATORY_CARE_PROVIDER_SITE_OTHER): Payer: BC Managed Care – PPO | Admitting: Podiatry

## 2014-11-11 ENCOUNTER — Ambulatory Visit: Payer: BC Managed Care – PPO | Admitting: Podiatry

## 2014-11-11 DIAGNOSIS — M722 Plantar fascial fibromatosis: Secondary | ICD-10-CM | POA: Diagnosis not present

## 2014-11-12 NOTE — Progress Notes (Signed)
Subjective:     Patient ID: Cristina Wilson, female   DOB: [birth year], 64 y.o.   MRN: [id number]  HPI patient states I'm still having some pain and I like to do more of the shockwaves if it continues   Review of Systems     Objective:   Physical Exam Neurovascular status intact with discomfort in the plantar heel right that's moderate in intensity and improved from previous visit    Assessment:     Plantar fasciitis present but improved    Plan:     Shockwave administered 4.4 intensity 17 frequency 3000 shocks tolerated well and reevaluate in a several months when she returns from the beach

## 2015-01-05 DIAGNOSIS — G510 Bell's palsy: Secondary | ICD-10-CM

## 2015-01-05 HISTORY — DX: Bell's palsy: G51.0

## 2015-01-13 ENCOUNTER — Ambulatory Visit: Payer: BC Managed Care – PPO | Admitting: Podiatry

## 2015-01-20 ENCOUNTER — Encounter: Payer: Self-pay | Admitting: Podiatry

## 2015-01-20 ENCOUNTER — Ambulatory Visit (INDEPENDENT_AMBULATORY_CARE_PROVIDER_SITE_OTHER): Payer: BC Managed Care – PPO | Admitting: Podiatry

## 2015-01-20 VITALS — BP 108/63 | HR 66 | Resp 16

## 2015-01-20 DIAGNOSIS — M722 Plantar fascial fibromatosis: Secondary | ICD-10-CM

## 2015-01-20 NOTE — Progress Notes (Signed)
Subjective:     Patient ID: Cristina Wilson, female   DOB: [birth year], 65 y.o.   MRN: [id number]  HPI patient states doing well with mild discomfort   Review of Systems     Objective:   Physical Exam Neurovascular status intact with significant diminishment of discomfort    Assessment:     Plantar fasciitis improved    Plan:     Do not recommend aggressive treatment now but do recommend supportive shoes stretching exercises and ice therapy. Reappoint as needed

## 2015-01-20 NOTE — Addendum Note (Signed)
Addended by: Wallene Huh on: 01/20/2015 01:17 PM   Modules accepted: Level of Service

## 2015-01-23 ENCOUNTER — Other Ambulatory Visit: Payer: Self-pay | Admitting: Orthopedic Surgery

## 2015-01-23 DIAGNOSIS — M25522 Pain in left elbow: Secondary | ICD-10-CM

## 2015-01-29 ENCOUNTER — Other Ambulatory Visit: Payer: BC Managed Care – PPO

## 2015-02-03 ENCOUNTER — Ambulatory Visit
Admission: RE | Admit: 2015-02-03 | Discharge: 2015-02-03 | Disposition: A | Discharge status: Home / Self Care | Payer: BC Managed Care – PPO | Source: Ambulatory Visit | Attending: Orthopedic Surgery | Admitting: Orthopedic Surgery

## 2015-02-03 ENCOUNTER — Other Ambulatory Visit: Payer: Self-pay | Admitting: Neurological Surgery

## 2015-02-03 DIAGNOSIS — M5416 Radiculopathy, lumbar region: Secondary | ICD-10-CM

## 2015-02-03 DIAGNOSIS — M25522 Pain in left elbow: Secondary | ICD-10-CM

## 2015-02-08 ENCOUNTER — Ambulatory Visit
Admission: RE | Admit: 2015-02-08 | Discharge: 2015-02-08 | Disposition: A | Discharge status: Home / Self Care | Payer: BC Managed Care – PPO | Source: Ambulatory Visit | Attending: Neurological Surgery | Admitting: Neurological Surgery

## 2015-02-08 DIAGNOSIS — M5416 Radiculopathy, lumbar region: Secondary | ICD-10-CM

## 2015-05-23 ENCOUNTER — Other Ambulatory Visit: Payer: Self-pay | Admitting: Neurological Surgery

## 2015-05-23 DIAGNOSIS — M4714 Other spondylosis with myelopathy, thoracic region: Secondary | ICD-10-CM

## 2015-05-26 ENCOUNTER — Ambulatory Visit
Admission: RE | Admit: 2015-05-26 | Discharge: 2015-05-26 | Disposition: A | Discharge status: Home / Self Care | Payer: BC Managed Care – PPO | Source: Ambulatory Visit | Attending: Neurological Surgery | Admitting: Neurological Surgery

## 2015-05-26 DIAGNOSIS — M4714 Other spondylosis with myelopathy, thoracic region: Secondary | ICD-10-CM

## 2015-08-06 ENCOUNTER — Other Ambulatory Visit: Payer: Self-pay | Admitting: Geriatric Medicine

## 2015-08-06 ENCOUNTER — Ambulatory Visit
Admission: RE | Admit: 2015-08-06 | Discharge: 2015-08-06 | Disposition: A | Discharge status: Home / Self Care | Payer: BC Managed Care – PPO | Source: Ambulatory Visit | Attending: Geriatric Medicine | Admitting: Geriatric Medicine

## 2015-08-06 DIAGNOSIS — M25551 Pain in right hip: Secondary | ICD-10-CM

## 2015-08-08 ENCOUNTER — Other Ambulatory Visit: Payer: Self-pay | Admitting: Neurosurgery

## 2015-08-08 DIAGNOSIS — I671 Cerebral aneurysm, nonruptured: Secondary | ICD-10-CM

## 2015-08-17 ENCOUNTER — Ambulatory Visit
Admission: RE | Admit: 2015-08-17 | Discharge: 2015-08-17 | Disposition: A | Discharge status: Home / Self Care | Payer: BC Managed Care – PPO | Source: Ambulatory Visit | Attending: Neurosurgery | Admitting: Neurosurgery

## 2015-08-17 DIAGNOSIS — I671 Cerebral aneurysm, nonruptured: Secondary | ICD-10-CM

## 2015-09-29 ENCOUNTER — Other Ambulatory Visit: Payer: Self-pay | Admitting: Geriatric Medicine

## 2015-09-29 DIAGNOSIS — R1031 Right lower quadrant pain: Secondary | ICD-10-CM

## 2015-10-02 ENCOUNTER — Ambulatory Visit
Admission: RE | Admit: 2015-10-02 | Discharge: 2015-10-02 | Disposition: A | Discharge status: Home / Self Care | Payer: BC Managed Care – PPO | Source: Ambulatory Visit | Attending: Geriatric Medicine | Admitting: Geriatric Medicine

## 2015-10-02 DIAGNOSIS — R1031 Right lower quadrant pain: Secondary | ICD-10-CM

## 2015-10-02 MED ORDER — IOPAMIDOL (ISOVUE-300) INJECTION 61%
100.0000 mL | Freq: Once | INTRAVENOUS | Status: AC | PRN
  Administered 2015-10-02: 100 mL via INTRAVENOUS

## 2015-10-03 ENCOUNTER — Other Ambulatory Visit: Payer: Self-pay | Admitting: Geriatric Medicine

## 2015-10-03 DIAGNOSIS — K8020 Calculus of gallbladder without cholecystitis without obstruction: Secondary | ICD-10-CM

## 2015-10-07 ENCOUNTER — Ambulatory Visit
Admission: RE | Admit: 2015-10-07 | Discharge: 2015-10-07 | Disposition: A | Discharge status: Home / Self Care | Payer: BC Managed Care – PPO | Source: Ambulatory Visit | Attending: Geriatric Medicine | Admitting: Geriatric Medicine

## 2015-10-07 DIAGNOSIS — K8020 Calculus of gallbladder without cholecystitis without obstruction: Secondary | ICD-10-CM

## 2015-10-13 ENCOUNTER — Other Ambulatory Visit: Payer: BC Managed Care – PPO

## 2016-01-29 ENCOUNTER — Other Ambulatory Visit: Payer: Self-pay | Admitting: Orthopedic Surgery

## 2016-01-29 DIAGNOSIS — M533 Sacrococcygeal disorders, not elsewhere classified: Secondary | ICD-10-CM

## 2016-02-04 ENCOUNTER — Other Ambulatory Visit: Payer: Self-pay | Admitting: Specialist

## 2016-02-04 ENCOUNTER — Ambulatory Visit
Admission: RE | Admit: 2016-02-04 | Discharge: 2016-02-04 | Disposition: A | Discharge status: Home / Self Care | Payer: BC Managed Care – PPO | Source: Ambulatory Visit | Attending: Orthopedic Surgery | Admitting: Orthopedic Surgery

## 2016-02-04 DIAGNOSIS — M533 Sacrococcygeal disorders, not elsewhere classified: Secondary | ICD-10-CM

## 2016-02-04 DIAGNOSIS — M25551 Pain in right hip: Secondary | ICD-10-CM

## 2016-02-04 NOTE — Discharge Instructions (Signed)
Joint Injection Discharge Instructions  1. After your joint injection, use ice to the affected area for the next 24 hours as a temporary increase in pain is not uncommon for a day or two after your procedure.  2. Resume all medications unless otherwise instructed.  3. Follow up with the ordering physician for post care.  4. If you have any of the following please call [phone]:      Temperature greater than 101     Pain, redness or swelling at the injection site

## 2016-02-12 ENCOUNTER — Ambulatory Visit
Admission: RE | Admit: 2016-02-12 | Discharge: 2016-02-12 | Disposition: A | Discharge status: Home / Self Care | Payer: BC Managed Care – PPO | Source: Ambulatory Visit | Attending: Specialist | Admitting: Specialist

## 2016-02-12 DIAGNOSIS — M25551 Pain in right hip: Secondary | ICD-10-CM

## 2016-05-25 DIAGNOSIS — M533 Sacrococcygeal disorders, not elsewhere classified: Secondary | ICD-10-CM

## 2016-05-25 HISTORY — DX: Sacrococcygeal disorders, not elsewhere classified: M53.3

## 2016-06-04 ENCOUNTER — Encounter: Payer: BC Managed Care – PPO | Admitting: Neurology

## 2016-08-17 ENCOUNTER — Other Ambulatory Visit: Payer: Self-pay | Admitting: Neurosurgery

## 2016-08-17 DIAGNOSIS — I671 Cerebral aneurysm, nonruptured: Secondary | ICD-10-CM

## 2016-08-18 ENCOUNTER — Ambulatory Visit
Admission: RE | Admit: 2016-08-18 | Discharge: 2016-08-18 | Disposition: A | Discharge status: Home / Self Care | Payer: BC Managed Care – PPO | Source: Ambulatory Visit | Attending: Neurosurgery | Admitting: Neurosurgery

## 2016-08-18 DIAGNOSIS — I671 Cerebral aneurysm, nonruptured: Secondary | ICD-10-CM

## 2016-09-14 ENCOUNTER — Ambulatory Visit
Admission: RE | Admit: 2016-09-14 | Discharge: 2016-09-14 | Disposition: A | Discharge status: Home / Self Care | Payer: BC Managed Care – PPO | Source: Ambulatory Visit | Attending: Geriatric Medicine | Admitting: Geriatric Medicine

## 2016-09-14 ENCOUNTER — Other Ambulatory Visit: Payer: Self-pay | Admitting: Geriatric Medicine

## 2016-09-14 DIAGNOSIS — R0789 Other chest pain: Secondary | ICD-10-CM

## 2016-09-29 ENCOUNTER — Other Ambulatory Visit: Payer: Self-pay | Admitting: Geriatric Medicine

## 2016-09-29 DIAGNOSIS — K824 Cholesterolosis of gallbladder: Secondary | ICD-10-CM

## 2016-09-30 ENCOUNTER — Other Ambulatory Visit: Payer: Self-pay | Admitting: Geriatric Medicine

## 2016-09-30 DIAGNOSIS — R911 Solitary pulmonary nodule: Secondary | ICD-10-CM

## 2016-10-01 ENCOUNTER — Ambulatory Visit
Admission: RE | Admit: 2016-10-01 | Discharge: 2016-10-01 | Disposition: A | Discharge status: Home / Self Care | Payer: BC Managed Care – PPO | Source: Ambulatory Visit | Attending: Geriatric Medicine | Admitting: Geriatric Medicine

## 2016-10-01 ENCOUNTER — Other Ambulatory Visit: Payer: BC Managed Care – PPO

## 2016-10-01 DIAGNOSIS — R911 Solitary pulmonary nodule: Secondary | ICD-10-CM

## 2016-10-07 ENCOUNTER — Ambulatory Visit
Admission: RE | Admit: 2016-10-07 | Discharge: 2016-10-07 | Disposition: A | Discharge status: Home / Self Care | Payer: BC Managed Care – PPO | Source: Ambulatory Visit | Attending: Geriatric Medicine | Admitting: Geriatric Medicine

## 2016-10-07 DIAGNOSIS — K824 Cholesterolosis of gallbladder: Secondary | ICD-10-CM

## 2016-10-08 ENCOUNTER — Ambulatory Visit: Payer: Self-pay | Admitting: Surgery

## 2016-10-08 NOTE — H&P (Signed)
History of Present Illness Cristina Wilson. Cristina Shropshire MD; 10/08/2016 12:23 PM) The patient is a 66 year old female who presents for evaluation of gall stones. Referred by Dr. Lajean Manes  This is a 66 year old female who initially presented to Dr. Felipa Eth in September 2017 with some right upper quadrant abdominal pain. At that time he ordered a CT scan of the abdomen and pelvis that raised the suspicion of some possible gallbladder polyps versus stones. Ultrasound showed for small gallbladder wall polyps the largest measuring 5 mm. She had a one-year follow-up ultrasound yesterday that showed that these polyps were larger with the largest now measuring 7 mm. Liver function tests are normal. The patient's right upper quadrant abdominal pain has resolved. Last year she did not really experience any nausea vomiting or diarrhea with her abdominal pain. However, due to the size of the polyps and the fact that they are enlarging, she is now referred for cholecystectomy for diagnosis.  CLINICAL DATA: Follow-up gallbladder polyp  EXAM: ULTRASOUND ABDOMEN LIMITED RIGHT UPPER QUADRANT  COMPARISON: Ultrasound 10/07/2015, CT 10/02/2015  FINDINGS: Gallbladder:  Re- demonstrated multiple, at least 4 non mobile nonshadowing foci in the gallbladder, measuring up to 7 mm. No wall thickening. Negative sonographic Murphy.  Common bile duct:  Diameter: Normal at 4.3 mm  Liver:  Cyst in the left lobe measuring 1.4 x 1.4 x 1.5 cm. Tiny cyst in the right lobe measuring 7 x 4 x 5 mm. Within normal limits in parenchymal echogenicity. Portal vein is patent on color Doppler imaging with normal direction of blood flow towards the liver.  IMPRESSION: 1. Re- demonstrated multiple non mobile, nonshadowing foci in the gallbladder suspected to represent small polyps, measuring up to 7 mm. 2. Small cysts in the liver   Electronically Signed By: Donavan Foil M.D. On: 10/07/2016 14:02  CLINICAL DATA:  Stones versus polyp by CT within the gallbladder  EXAM: US ABDOMEN LIMITED - RIGHT UPPER QUADRANT  COMPARISON: 10/02/2015  FINDINGS: Gallbladder:  Four non mobile polyps are noted along the gallbladder wall, the largest 5 mm. No wall thickening. No visible stones.  Common bile duct:  Diameter: Normal caliber, 3 mm  Liver:  1.5 cm simple appearing cyst in the dome of the liver. No biliary ductal dilatation. Normal echotexture.  IMPRESSION: Four small gallbladder wall polyps, the largest 5 mm. No stones.   Electronically Signed By: Rolm Baptise M.D. On: 10/07/2015 12:33  CLINICAL DATA: Intermittent right abdominal pain for 4 weeks. Constipation.  EXAM: CT ABDOMEN AND PELVIS WITH CONTRAST  TECHNIQUE: Multidetector CT imaging of the abdomen and pelvis was performed using the standard protocol following bolus administration of intravenous contrast.  CONTRAST: 190mL ISOVUE-300 IOPAMIDOL (ISOVUE-300) INJECTION 61%  COMPARISON: 02/10/2006  FINDINGS: Lower chest: Within the right middle lobe, there is a partially imaged nodule measuring 4 mm. No suspicious pulmonary nodules, pleural effusions, or infiltrates are identified. Heart size is normal. No imaged pericardial effusion or significant coronary artery calcifications.  Hepatobiliary: Small low-attenuation lesions are identified within the liver which are consistent with. Many of these are stable compared with the prior study. No suspicious liver lesions are identified. Non dependent nodules are identified in the gallbladder, consistent with stones or small polyps. Consider further evaluation with ultrasound.  Pancreas: Normal in appearance.  Spleen: Normal in appearance. Normal adrenal glands.  Adrenals/Urinary Tract: Parapelvic cysts are noted in the kidneys bilaterally. No hydronephrosis or suspicious renal mass.  Stomach/Bowel: Stomach is normal in appearance. Small bowel loops appear normal.  The  appendix is well seen and has a normal appearance. Colonic loops are normal in appearance. No evidence for bowel obstruction or abscess.  Vascular/Lymphatic: No retroperitoneal or mesenteric adenopathy. Minimal atherosclerotic calcification of the abdominal aorta.  Reproductive: Uterus is present. No adnexal mass identified. No free pelvic fluid.  Other: None  Musculoskeletal: Remote superior endplate fracture of L3. No suspicious lytic or blastic lesions are identified.  IMPRESSION: 1. Suspect small polyps within the gallbladder. Less likely, these could be small stones. Consider further evaluation with ultrasound. 2. 4 mm nodule within the right middle lobe. No follow-up needed if patient is low-risk. Non-contrast chest CT can be considered in 12 months if patient is high-risk. This recommendation follows the consensus statement: Guidelines for Management of Incidental Pulmonary Nodules Detected on CT Images: From the Fleischner Society 2017; Radiology 2017; 284:228-243. 3. Renal cysts. 4. Normal appendix. 5. Remote superior endplate fracture of L3. 6. Aortic atherosclerosis.   Electronically Signed By: Nolon Nations M.D. On: 10/02/2015 15:33    Past Surgical History Malachy Moan, RMA; 10/08/2016 11:26 AM) Breast Biopsy Left. Foot Surgery Left. Tonsillectomy  Diagnostic Studies History Malachy Moan, [place]; 10/08/2016 11:26 AM) Colonoscopy 5-10 years ago Mammogram within last year Pap Smear 1-5 years ago  Allergies Malachy Moan, RMA; 10/08/2016 11:31 AM) Allergies Reconciled Sulfa Antibiotics  Medication History Malachy Moan, RMA; 10/08/2016 11:29 AM) Celecoxib (200MG  Capsule, Oral) Active. Cyclobenzaprine HCl (5MG  Tablet, Oral) Active. Levothyroxine Sodium (125MCG Tablet, Oral) Active. Lisinopril (5MG  Tablet, Oral) Active. TraZODone HCl (50MG  Tablet, Oral) Active. Famotidine (20MG  Tablet, Oral)  Active. HydroCHLOROthiazide (12.5MG  Capsule, Oral) Active. Restasis (0.05% Emulsion, Ophthalmic) Active. Medications Reconciled  Social History Malachy Moan, [place]; 10/08/2016 11:26 AM) Alcohol use Occasional alcohol use. Caffeine use Coffee. No drug use Tobacco use Former smoker.  Family History Malachy Moan, [place]; 10/08/2016 11:26 AM) Alcohol Abuse Mother. Heart Disease Mother. Kidney Disease Mother.  Pregnancy / Birth History Malachy Moan, [place]; 10/08/2016 11:26 AM) Age at menarche 36 years. Age of menopause <46 Gravida 0 Para 0  Other Problems Malachy Moan, [place]; 10/08/2016 11:26 AM) Back Pain High blood pressure Lump In Breast     Review of Systems Malachy Moan RMA; 10/08/2016 11:26 AM) General Not Present- Appetite Loss, Chills, Fatigue, Fever, Night Sweats, Weight Gain and Weight Loss. Skin Not Present- Change in Wart/Mole, Dryness, Hives, Jaundice, New Lesions, Non-Healing Wounds, Rash and Ulcer. HEENT Not Present- Earache, Hearing Loss, Hoarseness, Nose Bleed, Oral Ulcers, Ringing in the Ears, Seasonal Allergies, Sinus Pain, Sore Throat, Visual Disturbances, Wears glasses/contact lenses and Yellow Eyes. Respiratory Not Present- Bloody sputum, Chronic Cough, Difficulty Breathing, Snoring and Wheezing. Breast Not Present- Breast Mass, Breast Pain, Nipple Discharge and Skin Changes. Gastrointestinal Present- Constipation. Not Present- Abdominal Pain, Bloating, Bloody Stool, Change in Bowel Habits, Chronic diarrhea, Difficulty Swallowing, Excessive gas, Gets full quickly at meals, Hemorrhoids, Indigestion, Nausea, Rectal Pain and Vomiting. Female Genitourinary Not Present- Frequency, Nocturia, Painful Urination, Pelvic Pain and Urgency. Musculoskeletal Present- Back Pain. Not Present- Joint Pain, Joint Stiffness, Muscle Pain, Muscle Weakness and Swelling of Extremities. Neurological Not Present- Decreased Memory, Fainting, Headaches,  Numbness, Seizures, Tingling, Tremor, Trouble walking and Weakness. Psychiatric Not Present- Anxiety, Bipolar, Change in Sleep Pattern, Depression, Fearful and Frequent crying. Endocrine Not Present- Cold Intolerance, Excessive Hunger, Hair Changes, Heat Intolerance, Hot flashes and New Diabetes. Hematology Not Present- Blood Thinners, Easy Bruising, Excessive bleeding, Gland problems, HIV and Persistent Infections.  Vitals Malachy Moan RMA; 10/08/2016 11:29 AM) 10/08/2016 11:29 AM Weight: 157 lb Height: 65in Body Surface  Area: 1.78 m Body Mass Index: 26.13 kg/m  Temp.: 98.35F  Pulse: 64 (Regular)  BP: 135/82 (Sitting, Left Arm, Standard)      Physical Exam Rodman Key K. Simon Llamas MD; 10/08/2016 12:23 PM)  The physical exam findings are as follows: Note:WDWN in NAD The patient has chronic low back pain and therefore does not like to sit down. Eyes: Pupils equal, round; sclera anicteric HENT: Oral mucosa moist; good dentition Neck: No masses palpated, no thyromegaly Lungs: CTA bilaterally; normal respiratory effort CV: Regular rate and rhythm; no murmurs; extremities well-perfused with no edema Abd: +bowel sounds, soft, non-tender, no palpable organomegaly; no palpable hernias Skin: Warm, dry; no sign of jaundice Psychiatric - alert and oriented x 4; calm mood and affect    Assessment & Plan Rodman Key K. Kharson Rasmusson MD; 10/08/2016 12:06 PM)  GALLBLADDER POLYP (K82.4)  Current Plans Schedule for Surgery - Laparoscopic cholecystectomy with intraoperative cholangiogram. The surgical procedure has been discussed with the patient. Potential risks, benefits, alternative treatments, and expected outcomes have been explained. All of the patient's questions at this time have been answered. The likelihood of reaching the patient's treatment goal is good. The patient understand the proposed surgical procedure and wishes to proceed. Pt Education - Pamphlet Given - Laparoscopic  Gallbladder Surgery: discussed with patient and provided information.  Cristina Wilson. Georgette Dover, MD, [facility]  General/ Trauma Surgery  10/08/2016 12:24 PM

## 2016-10-11 ENCOUNTER — Encounter (HOSPITAL_COMMUNITY): Payer: Self-pay | Admitting: *Deleted

## 2016-10-12 ENCOUNTER — Encounter (HOSPITAL_COMMUNITY)
Admission: RE | Admit: 2016-10-12 | Discharge: 2016-10-12 | Disposition: A | Discharge status: Home / Self Care | Payer: BC Managed Care – PPO | Source: Ambulatory Visit | Attending: Surgery | Admitting: Surgery

## 2016-10-12 ENCOUNTER — Encounter (HOSPITAL_COMMUNITY): Payer: Self-pay

## 2016-10-12 DIAGNOSIS — E039 Hypothyroidism, unspecified: Secondary | ICD-10-CM | POA: Diagnosis not present

## 2016-10-12 DIAGNOSIS — Z841 Family history of disorders of kidney and ureter: Secondary | ICD-10-CM | POA: Diagnosis not present

## 2016-10-12 DIAGNOSIS — K811 Chronic cholecystitis: Secondary | ICD-10-CM | POA: Diagnosis not present

## 2016-10-12 DIAGNOSIS — Z811 Family history of alcohol abuse and dependence: Secondary | ICD-10-CM | POA: Diagnosis not present

## 2016-10-12 DIAGNOSIS — Z8249 Family history of ischemic heart disease and other diseases of the circulatory system: Secondary | ICD-10-CM | POA: Diagnosis not present

## 2016-10-12 DIAGNOSIS — Z79899 Other long term (current) drug therapy: Secondary | ICD-10-CM | POA: Diagnosis not present

## 2016-10-12 DIAGNOSIS — Z882 Allergy status to sulfonamides status: Secondary | ICD-10-CM | POA: Diagnosis not present

## 2016-10-12 DIAGNOSIS — K8020 Calculus of gallbladder without cholecystitis without obstruction: Secondary | ICD-10-CM | POA: Diagnosis present

## 2016-10-12 DIAGNOSIS — I10 Essential (primary) hypertension: Secondary | ICD-10-CM | POA: Diagnosis not present

## 2016-10-12 DIAGNOSIS — M549 Dorsalgia, unspecified: Secondary | ICD-10-CM | POA: Diagnosis not present

## 2016-10-12 DIAGNOSIS — Z87891 Personal history of nicotine dependence: Secondary | ICD-10-CM | POA: Diagnosis not present

## 2016-10-12 HISTORY — DX: Unspecified osteoarthritis, unspecified site: M19.90

## 2016-10-12 LAB — BASIC METABOLIC PANEL
Anion gap: 10 (ref 5–15)
BUN: 13 mg/dL (ref 6–20)
CO2: 25 mmol/L (ref 22–32)
Calcium: 9.2 mg/dL (ref 8.9–10.3)
Chloride: 102 mmol/L (ref 101–111)
Creatinine, Ser: 0.62 mg/dL (ref 0.44–1.00)
GFR calc Af Amer: >60 mL/min (ref >60)
GFR calc non Af Amer: >60 mL/min (ref >60)
Glucose, Bld: 102 mg/dL — ABNORMAL HIGH (ref 65–99)
Potassium: 4.4 mmol/L (ref 3.5–5.1)
Sodium: 137 mmol/L (ref 135–145)

## 2016-10-12 LAB — CBC
HCT: 44.8 % (ref 36.0–46.0)
Hemoglobin: 14.6 g/dL (ref 12.0–15.0)
MCH: 27.2 pg (ref 26.0–34.0)
MCHC: 32.6 g/dL (ref 30.0–36.0)
MCV: 83.4 fL (ref 78.0–100.0)
Platelets: 354 10*3/uL (ref 150–400)
RBC: 5.37 MIL/uL — ABNORMAL HIGH (ref 3.87–5.11)
RDW: 12.6 % (ref 11.5–15.5)
WBC: 6.6 10*3/uL (ref 4.0–10.5)

## 2016-10-12 MED ORDER — CEFAZOLIN SODIUM-DEXTROSE 2-4 GM/100ML-% IV SOLN
2.0000 g | INTRAVENOUS | Status: AC
  Administered 2016-10-13: 2 g via INTRAVENOUS
  Filled 2016-10-12: qty 100

## 2016-10-12 NOTE — Progress Notes (Signed)
PCP  Dr. Lajean Manes   Never seen cardiologist  Denies ever having any cardiac testing done

## 2016-10-12 NOTE — Pre-Procedure Instructions (Signed)
Cristina Wilson  10/12/2016      [facility] [address] [place] [address] [place] [place] [address] Phone: [phone] Fax: [phone]    Your procedure is scheduled on 10-13-2016  Wednesday .  Report to [facility] Admitting at 12:30 PM  Call this number if you have problems the morning of surgery:  [phone]   Remember:  Do not eat food or drink liquids after midnight.   Take these medicines the morning of surgery with A SIP OF WATER levothyroxine(Synthroid),  STOP ASPIRIN,ANTIINFLAMATORIES (IBUPROFEN,ALEVE,MOTRIN,ADVIL,GOODY'S POWDERS),HERBAL SUPPLEMENTS,FISH OIL,AND VITAMINS 5-7 DAYS PRIOR TO SURGERY   Do not wear jewelry, make-up or nail polish.  Do not wear lotions, powders, or perfumes, or deoderant.  Do not shave 48 hours prior to surgery.  Men may shave face and neck.   Do not bring valuables to the hospital.  [facility] is not responsible for any belongings or valuables.  Contacts, dentures or bridgework may not be worn into surgery.  Leave your suitcase in the car.  After surgery it may be brought to your room.  For patients admitted to the hospital, discharge time will be determined by your treatment team.  Patients discharged the day of surgery will not be allowed to drive home.    Special Instructions: [place] - Preparing for Surgery  Before surgery, you can play an important role.  Because skin is not sterile, your skin needs to be as free of germs as possible.  You can reduce the number of germs on you skin by washing with CHG (chlorahexidine gluconate) soap before surgery.  CHG is an antiseptic cleaner which kills germs and bonds with the skin to continue killing germs even after washing.  Please DO NOT use if you have an allergy to CHG or antibacterial soaps.  If your skin becomes reddened/irritated stop using the CHG and inform your nurse when you arrive at Short Stay.  Do not shave  (including legs and underarms) for at least 48 hours prior to the first CHG shower.  You may shave your face.  Please follow these instructions carefully:   1.  Shower with CHG Soap the night before surgery and the   morning of Surgery.  2.  If you choose to wash your hair, wash your hair first as usual with your normal shampoo.  3.  After you shampoo, rinse your hair and body thoroughly to remove the  Shampoo.  4.  Use CHG as you would any other liquid soap.  You can apply chg directly  to the skin and wash gently with scrungie or a clean washcloth.  5.  Apply the CHG Soap to your body ONLY FROM THE NECK DOWN.   Do not use on open wounds or open sores.  Avoid contact with your eyes,  ears, mouth and genitals (private parts).  Wash genitals (private parts) with your normal soap.  6.  Wash thoroughly, paying special attention to the area where your surgery will be performed.  7.  Thoroughly rinse your body with warm water from the neck down.  8.  DO NOT shower/wash with your normal soap after using and rinsing o  the CHG Soap.  9.  Pat yourself dry with a clean towel.            10.  Wear clean pajamas.            11.  Place clean sheets on your bed the night  of your first shower and do not sleep with pets.  Day of Surgery  Do not apply any lotions/deodorants the morning of surgery.  Please wear clean clothes to the hospital/surgery center.   Please read over the following fact sheets that you were given. Coughing and Deep Breathing and Surgical Site Infection Prevention

## 2016-10-13 ENCOUNTER — Ambulatory Visit (HOSPITAL_COMMUNITY): Payer: BC Managed Care – PPO

## 2016-10-13 ENCOUNTER — Ambulatory Visit (HOSPITAL_COMMUNITY): Payer: BC Managed Care – PPO | Admitting: Anesthesiology

## 2016-10-13 ENCOUNTER — Ambulatory Visit (HOSPITAL_COMMUNITY)
Admission: RE | Admit: 2016-10-13 | Discharge: 2016-10-13 | Disposition: A | Discharge status: Home / Self Care | Payer: BC Managed Care – PPO | Source: Ambulatory Visit | Attending: Surgery | Admitting: Surgery

## 2016-10-13 ENCOUNTER — Encounter (HOSPITAL_COMMUNITY): Payer: Self-pay | Admitting: Urology

## 2016-10-13 ENCOUNTER — Encounter (HOSPITAL_COMMUNITY)
Admission: RE | Disposition: A | Discharge status: Home / Self Care | Payer: Self-pay | Source: Ambulatory Visit | Attending: Surgery

## 2016-10-13 DIAGNOSIS — Z87891 Personal history of nicotine dependence: Secondary | ICD-10-CM | POA: Insufficient documentation

## 2016-10-13 DIAGNOSIS — E039 Hypothyroidism, unspecified: Secondary | ICD-10-CM | POA: Insufficient documentation

## 2016-10-13 DIAGNOSIS — K811 Chronic cholecystitis: Secondary | ICD-10-CM | POA: Diagnosis not present

## 2016-10-13 DIAGNOSIS — Z79899 Other long term (current) drug therapy: Secondary | ICD-10-CM | POA: Insufficient documentation

## 2016-10-13 DIAGNOSIS — Z8249 Family history of ischemic heart disease and other diseases of the circulatory system: Secondary | ICD-10-CM | POA: Insufficient documentation

## 2016-10-13 DIAGNOSIS — I10 Essential (primary) hypertension: Secondary | ICD-10-CM | POA: Insufficient documentation

## 2016-10-13 DIAGNOSIS — Z419 Encounter for procedure for purposes other than remedying health state, unspecified: Secondary | ICD-10-CM

## 2016-10-13 DIAGNOSIS — Z841 Family history of disorders of kidney and ureter: Secondary | ICD-10-CM | POA: Insufficient documentation

## 2016-10-13 DIAGNOSIS — Z882 Allergy status to sulfonamides status: Secondary | ICD-10-CM | POA: Insufficient documentation

## 2016-10-13 DIAGNOSIS — Z811 Family history of alcohol abuse and dependence: Secondary | ICD-10-CM | POA: Insufficient documentation

## 2016-10-13 DIAGNOSIS — M549 Dorsalgia, unspecified: Secondary | ICD-10-CM | POA: Insufficient documentation

## 2016-10-13 HISTORY — PX: CHOLECYSTECTOMY: SHX55

## 2016-10-13 SURGERY — LAPAROSCOPIC CHOLECYSTECTOMY WITH INTRAOPERATIVE CHOLANGIOGRAM
Anesthesia: General

## 2016-10-13 MED ORDER — ACETAMINOPHEN 10 MG/ML IV SOLN
INTRAVENOUS | Status: DC | PRN
  Administered 2016-10-13: 1000 mg via INTRAVENOUS

## 2016-10-13 MED ORDER — KETOROLAC TROMETHAMINE 30 MG/ML IJ SOLN
30.0000 mg | Freq: Once | INTRAMUSCULAR | Status: DC | PRN
Start: 2016-10-13 — End: 2016-10-13
  Administered 2016-10-13: 30 mg via INTRAVENOUS

## 2016-10-13 MED ORDER — OXYCODONE HCL 5 MG PO TABS
ORAL_TABLET | ORAL | Status: AC
  Filled 2016-10-13: qty 1

## 2016-10-13 MED ORDER — HYDROCODONE-ACETAMINOPHEN 5-325 MG PO TABS: 1.0000 | ORAL_TABLET | ORAL | 0 refills | Status: DC | PRN

## 2016-10-13 MED ORDER — KETOROLAC TROMETHAMINE 30 MG/ML IJ SOLN
INTRAMUSCULAR | Status: AC
  Filled 2016-10-13: qty 1

## 2016-10-13 MED ORDER — FENTANYL CITRATE (PF) 100 MCG/2ML IJ SOLN
INTRAMUSCULAR | Status: AC
  Administered 2016-10-13: 50 ug via INTRAVENOUS
  Filled 2016-10-13: qty 2

## 2016-10-13 MED ORDER — EPHEDRINE 5 MG/ML INJ
INTRAVENOUS | Status: AC
  Filled 2016-10-13: qty 20

## 2016-10-13 MED ORDER — ACETAMINOPHEN 160 MG/5ML PO SOLN: 325.0000 mg | ORAL | Status: DC | PRN

## 2016-10-13 MED ORDER — OXYCODONE HCL 5 MG/5ML PO SOLN: 5.0000 mg | Freq: Once | ORAL | Status: AC | PRN

## 2016-10-13 MED ORDER — HYDROMORPHONE HCL 1 MG/ML IJ SOLN
INTRAMUSCULAR | Status: AC
  Filled 2016-10-13: qty 1

## 2016-10-13 MED ORDER — HYDROMORPHONE HCL 1 MG/ML IJ SOLN
INTRAMUSCULAR | Status: AC
  Administered 2016-10-13: 0.5 mg via INTRAVENOUS
  Filled 2016-10-13: qty 1

## 2016-10-13 MED ORDER — ACETAMINOPHEN 10 MG/ML IV SOLN
INTRAVENOUS | Status: AC
  Filled 2016-10-13: qty 100

## 2016-10-13 MED ORDER — PROPOFOL 10 MG/ML IV BOLUS
INTRAVENOUS | Status: DC | PRN
  Administered 2016-10-13: 140 mg via INTRAVENOUS

## 2016-10-13 MED ORDER — DEXAMETHASONE SODIUM PHOSPHATE 10 MG/ML IJ SOLN
INTRAMUSCULAR | Status: AC
  Filled 2016-10-13: qty 1

## 2016-10-13 MED ORDER — ONDANSETRON HCL 4 MG/2ML IJ SOLN
INTRAMUSCULAR | Status: DC | PRN
  Administered 2016-10-13: 4 mg via INTRAVENOUS

## 2016-10-13 MED ORDER — ACETAMINOPHEN 325 MG PO TABS: 325.0000 mg | ORAL_TABLET | ORAL | Status: DC | PRN

## 2016-10-13 MED ORDER — ONDANSETRON HCL 4 MG/2ML IJ SOLN: 4.0000 mg | Freq: Once | INTRAMUSCULAR | Status: DC | PRN

## 2016-10-13 MED ORDER — FENTANYL CITRATE (PF) 100 MCG/2ML IJ SOLN
INTRAMUSCULAR | Status: DC | PRN
  Administered 2016-10-13 (×2): 100 ug via INTRAVENOUS

## 2016-10-13 MED ORDER — HYDROMORPHONE HCL 1 MG/ML IJ SOLN
INTRAMUSCULAR | Status: DC | PRN
  Administered 2016-10-13: 1 mg via INTRAVENOUS

## 2016-10-13 MED ORDER — CHLORHEXIDINE GLUCONATE CLOTH 2 % EX PADS: 6.0000 | MEDICATED_PAD | Freq: Once | CUTANEOUS | Status: DC

## 2016-10-13 MED ORDER — SODIUM CHLORIDE 0.9 % IR SOLN
Status: DC | PRN
  Administered 2016-10-13: 1000 mL

## 2016-10-13 MED ORDER — SCOPOLAMINE 1 MG/3DAYS TD PT72
MEDICATED_PATCH | TRANSDERMAL | Status: DC | PRN
  Administered 2016-10-13: 1 via TRANSDERMAL

## 2016-10-13 MED ORDER — LIDOCAINE 2% (20 MG/ML) 5 ML SYRINGE
INTRAMUSCULAR | Status: AC
  Filled 2016-10-13: qty 5

## 2016-10-13 MED ORDER — FENTANYL CITRATE (PF) 100 MCG/2ML IJ SOLN
25.0000 ug | INTRAMUSCULAR | Status: DC | PRN
  Administered 2016-10-13 (×3): 50 ug via INTRAVENOUS

## 2016-10-13 MED ORDER — LIDOCAINE HCL (CARDIAC) 20 MG/ML IV SOLN
INTRAVENOUS | Status: DC | PRN
  Administered 2016-10-13: 100 mg via INTRAVENOUS

## 2016-10-13 MED ORDER — LACTATED RINGERS IV SOLN
INTRAVENOUS | Status: DC
  Administered 2016-10-13 (×3): via INTRAVENOUS

## 2016-10-13 MED ORDER — 0.9 % SODIUM CHLORIDE (POUR BTL) OPTIME
TOPICAL | Status: DC | PRN
  Administered 2016-10-13: 1000 mL

## 2016-10-13 MED ORDER — OXYCODONE HCL 5 MG PO TABS
5.0000 mg | ORAL_TABLET | Freq: Once | ORAL | Status: AC | PRN
  Administered 2016-10-13: 5 mg via ORAL

## 2016-10-13 MED ORDER — FENTANYL CITRATE (PF) 250 MCG/5ML IJ SOLN
INTRAMUSCULAR | Status: AC
  Filled 2016-10-13: qty 5

## 2016-10-13 MED ORDER — PHENYLEPHRINE 40 MCG/ML (10ML) SYRINGE FOR IV PUSH (FOR BLOOD PRESSURE SUPPORT)
PREFILLED_SYRINGE | INTRAVENOUS | Status: AC
  Filled 2016-10-13: qty 10

## 2016-10-13 MED ORDER — MEPERIDINE HCL 25 MG/ML IJ SOLN: 6.2500 mg | INTRAMUSCULAR | Status: DC | PRN

## 2016-10-13 MED ORDER — FENTANYL CITRATE (PF) 100 MCG/2ML IJ SOLN
INTRAMUSCULAR | Status: AC
  Filled 2016-10-13: qty 2

## 2016-10-13 MED ORDER — BUPIVACAINE-EPINEPHRINE (PF) 0.25% -1:200000 IJ SOLN
INTRAMUSCULAR | Status: AC
  Filled 2016-10-13: qty 30

## 2016-10-13 MED ORDER — ONDANSETRON HCL 4 MG/2ML IJ SOLN
INTRAMUSCULAR | Status: AC
  Filled 2016-10-13: qty 4

## 2016-10-13 MED ORDER — BUPIVACAINE-EPINEPHRINE 0.25% -1:200000 IJ SOLN
INTRAMUSCULAR | Status: DC | PRN
  Administered 2016-10-13: 10 mL

## 2016-10-13 MED ORDER — HYDROMORPHONE HCL 1 MG/ML IJ SOLN
0.2500 mg | INTRAMUSCULAR | Status: DC | PRN
  Administered 2016-10-13 (×2): 0.5 mg via INTRAVENOUS

## 2016-10-13 MED ORDER — SCOPOLAMINE 1 MG/3DAYS TD PT72
MEDICATED_PATCH | TRANSDERMAL | Status: AC
  Filled 2016-10-13: qty 1

## 2016-10-13 MED ORDER — MIDAZOLAM HCL 5 MG/5ML IJ SOLN
INTRAMUSCULAR | Status: DC | PRN
  Administered 2016-10-13: 2 mg via INTRAVENOUS

## 2016-10-13 MED ORDER — ROCURONIUM BROMIDE 100 MG/10ML IV SOLN
INTRAVENOUS | Status: DC | PRN
  Administered 2016-10-13: 50 mg via INTRAVENOUS

## 2016-10-13 MED ORDER — SODIUM CHLORIDE 0.9 % IV SOLN
INTRAVENOUS | Status: DC | PRN
  Administered 2016-10-13: 5 mL

## 2016-10-13 MED ORDER — DEXAMETHASONE SODIUM PHOSPHATE 10 MG/ML IJ SOLN
INTRAMUSCULAR | Status: DC | PRN
  Administered 2016-10-13: 10 mg via INTRAVENOUS

## 2016-10-13 MED ORDER — MIDAZOLAM HCL 2 MG/2ML IJ SOLN
INTRAMUSCULAR | Status: AC
  Filled 2016-10-13: qty 2

## 2016-10-13 MED ORDER — PROPOFOL 10 MG/ML IV BOLUS
INTRAVENOUS | Status: AC
  Filled 2016-10-13: qty 20

## 2016-10-13 MED ORDER — SUGAMMADEX SODIUM 200 MG/2ML IV SOLN
INTRAVENOUS | Status: AC
  Filled 2016-10-13: qty 2

## 2016-10-13 MED ORDER — ONDANSETRON HCL 4 MG/2ML IJ SOLN
INTRAMUSCULAR | Status: AC
  Filled 2016-10-13: qty 2

## 2016-10-13 MED ORDER — SUGAMMADEX SODIUM 200 MG/2ML IV SOLN
INTRAVENOUS | Status: DC | PRN
  Administered 2016-10-13: 150 mg via INTRAVENOUS

## 2016-10-13 MED ORDER — IOPAMIDOL (ISOVUE-300) INJECTION 61%
INTRAVENOUS | Status: AC
  Filled 2016-10-13: qty 50

## 2016-10-13 SURGICAL SUPPLY — 40 items
APPLIER CLIP ROT 10 11.4 M/L (STAPLE) ×2
BENZOIN TINCTURE PRP APPL 2/3 (GAUZE/BANDAGES/DRESSINGS) ×2 IMPLANT
BLADE CLIPPER SURG (BLADE) IMPLANT
CANISTER SUCT 3000ML PPV (MISCELLANEOUS) ×2 IMPLANT
CHLORAPREP W/TINT 26ML (MISCELLANEOUS) ×2 IMPLANT
CLIP APPLIE ROT 10 11.4 M/L (STAPLE) ×1 IMPLANT
COVER MAYO STAND STRL (DRAPES) ×2 IMPLANT
COVER SURGICAL LIGHT HANDLE (MISCELLANEOUS) ×2 IMPLANT
DRAPE C-ARM 42X72 X-RAY (DRAPES) ×2 IMPLANT
DRSG TEGADERM 2-3/8X2-3/4 SM (GAUZE/BANDAGES/DRESSINGS) ×6 IMPLANT
DRSG TEGADERM 4X4.75 (GAUZE/BANDAGES/DRESSINGS) ×2 IMPLANT
ELECT REM PT RETURN 9FT ADLT (ELECTROSURGICAL) ×2
ELECTRODE REM PT RTRN 9FT ADLT (ELECTROSURGICAL) ×1 IMPLANT
FILTER SMOKE EVAC LAPAROSHD (FILTER) ×2 IMPLANT
GAUZE SPONGE 2X2 8PLY STRL LF (GAUZE/BANDAGES/DRESSINGS) ×1 IMPLANT
GLOVE BIO SURGEON STRL SZ7 (GLOVE) ×2 IMPLANT
GLOVE BIOGEL PI IND STRL 7.5 (GLOVE) ×1 IMPLANT
GLOVE BIOGEL PI INDICATOR 7.5 (GLOVE) ×1
GOWN STRL REUS W/ TWL LRG LVL3 (GOWN DISPOSABLE) ×3 IMPLANT
GOWN STRL REUS W/TWL LRG LVL3 (GOWN DISPOSABLE) ×3
KIT BASIN OR (CUSTOM PROCEDURE TRAY) ×2 IMPLANT
KIT ROOM TURNOVER OR (KITS) ×2 IMPLANT
NS IRRIG 1000ML POUR BTL (IV SOLUTION) ×2 IMPLANT
PAD ARMBOARD 7.5X6 YLW CONV (MISCELLANEOUS) ×2 IMPLANT
POUCH SPECIMEN RETRIEVAL 10MM (ENDOMECHANICALS) ×2 IMPLANT
SCISSORS LAP 5X35 DISP (ENDOMECHANICALS) ×2 IMPLANT
SET CHOLANGIOGRAPH 5 50 .035 (SET/KITS/TRAYS/PACK) ×2 IMPLANT
SET IRRIG TUBING LAPAROSCOPIC (IRRIGATION / IRRIGATOR) ×2 IMPLANT
SLEEVE ENDOPATH XCEL 5M (ENDOMECHANICALS) ×2 IMPLANT
SPECIMEN JAR SMALL (MISCELLANEOUS) ×2 IMPLANT
SPONGE GAUZE 2X2 STER 10/PKG (GAUZE/BANDAGES/DRESSINGS) ×1
STRIP CLOSURE SKIN 1/2X4 (GAUZE/BANDAGES/DRESSINGS) ×2 IMPLANT
SUT MNCRL AB 4-0 PS2 18 (SUTURE) ×2 IMPLANT
TOWEL OR 17X24 6PK STRL BLUE (TOWEL DISPOSABLE) ×2 IMPLANT
TOWEL OR 17X26 10 PK STRL BLUE (TOWEL DISPOSABLE) ×2 IMPLANT
TRAY LAPAROSCOPIC MC (CUSTOM PROCEDURE TRAY) ×2 IMPLANT
TROCAR XCEL BLUNT TIP 100MML (ENDOMECHANICALS) ×2 IMPLANT
TROCAR XCEL NON-BLD 11X100MML (ENDOMECHANICALS) ×2 IMPLANT
TROCAR XCEL NON-BLD 5MMX100MML (ENDOMECHANICALS) ×2 IMPLANT
TUBING INSUFFLATION (TUBING) ×2 IMPLANT

## 2016-10-13 NOTE — H&P (View-Only) (Signed)
History of Present Illness Cristina Wilson. Cristina Eagles MD; 10/08/2016 12:23 PM) The patient is a 66 year old female who presents for evaluation of gall stones. Referred by Dr. Lajean Manes  This is a 66 year old female who initially presented to Dr. Felipa Eth in September 2017 with some right upper quadrant abdominal pain. At that time he ordered a CT scan of the abdomen and pelvis that raised the suspicion of some possible gallbladder polyps versus stones. Ultrasound showed for small gallbladder wall polyps the largest measuring 5 mm. She had a one-year follow-up ultrasound yesterday that showed that these polyps were larger with the largest now measuring 7 mm. Liver function tests are normal. The patient's right upper quadrant abdominal pain has resolved. Last year she did not really experience any nausea vomiting or diarrhea with her abdominal pain. However, due to the size of the polyps and the fact that they are enlarging, she is now referred for cholecystectomy for diagnosis.  CLINICAL DATA: Follow-up gallbladder polyp  EXAM: ULTRASOUND ABDOMEN LIMITED RIGHT UPPER QUADRANT  COMPARISON: Ultrasound 10/07/2015, CT 10/02/2015  FINDINGS: Gallbladder:  Re- demonstrated multiple, at least 4 non mobile nonshadowing foci in the gallbladder, measuring up to 7 mm. No wall thickening. Negative sonographic Murphy.  Common bile duct:  Diameter: Normal at 4.3 mm  Liver:  Cyst in the left lobe measuring 1.4 x 1.4 x 1.5 cm. Tiny cyst in the right lobe measuring 7 x 4 x 5 mm. Within normal limits in parenchymal echogenicity. Portal vein is patent on color Doppler imaging with normal direction of blood flow towards the liver.  IMPRESSION: 1. Re- demonstrated multiple non mobile, nonshadowing foci in the gallbladder suspected to represent small polyps, measuring up to 7 mm. 2. Small cysts in the liver   Electronically Signed By: Donavan Foil M.D. On: 10/07/2016 14:02  CLINICAL DATA:  Stones versus polyp by CT within the gallbladder  EXAM: US ABDOMEN LIMITED - RIGHT UPPER QUADRANT  COMPARISON: 10/02/2015  FINDINGS: Gallbladder:  Four non mobile polyps are noted along the gallbladder wall, the largest 5 mm. No wall thickening. No visible stones.  Common bile duct:  Diameter: Normal caliber, 3 mm  Liver:  1.5 cm simple appearing cyst in the dome of the liver. No biliary ductal dilatation. Normal echotexture.  IMPRESSION: Four small gallbladder wall polyps, the largest 5 mm. No stones.   Electronically Signed By: Rolm Baptise M.D. On: 10/07/2015 12:33  CLINICAL DATA: Intermittent right abdominal pain for 4 weeks. Constipation.  EXAM: CT ABDOMEN AND PELVIS WITH CONTRAST  TECHNIQUE: Multidetector CT imaging of the abdomen and pelvis was performed using the standard protocol following bolus administration of intravenous contrast.  CONTRAST: 127mL ISOVUE-300 IOPAMIDOL (ISOVUE-300) INJECTION 61%  COMPARISON: 02/10/2006  FINDINGS: Lower chest: Within the right middle lobe, there is a partially imaged nodule measuring 4 mm. No suspicious pulmonary nodules, pleural effusions, or infiltrates are identified. Heart size is normal. No imaged pericardial effusion or significant coronary artery calcifications.  Hepatobiliary: Small low-attenuation lesions are identified within the liver which are consistent with. Many of these are stable compared with the prior study. No suspicious liver lesions are identified. Non dependent nodules are identified in the gallbladder, consistent with stones or small polyps. Consider further evaluation with ultrasound.  Pancreas: Normal in appearance.  Spleen: Normal in appearance. Normal adrenal glands.  Adrenals/Urinary Tract: Parapelvic cysts are noted in the kidneys bilaterally. No hydronephrosis or suspicious renal mass.  Stomach/Bowel: Stomach is normal in appearance. Small bowel loops appear normal.  The  appendix is well seen and has a normal appearance. Colonic loops are normal in appearance. No evidence for bowel obstruction or abscess.  Vascular/Lymphatic: No retroperitoneal or mesenteric adenopathy. Minimal atherosclerotic calcification of the abdominal aorta.  Reproductive: Uterus is present. No adnexal mass identified. No free pelvic fluid.  Other: None  Musculoskeletal: Remote superior endplate fracture of L3. No suspicious lytic or blastic lesions are identified.  IMPRESSION: 1. Suspect small polyps within the gallbladder. Less likely, these could be small stones. Consider further evaluation with ultrasound. 2. 4 mm nodule within the right middle lobe. No follow-up needed if patient is low-risk. Non-contrast chest CT can be considered in 12 months if patient is high-risk. This recommendation follows the consensus statement: Guidelines for Management of Incidental Pulmonary Nodules Detected on CT Images: From the Fleischner Society 2017; Radiology 2017; 284:228-243. 3. Renal cysts. 4. Normal appendix. 5. Remote superior endplate fracture of L3. 6. Aortic atherosclerosis.   Electronically Signed By: Nolon Nations M.D. On: 10/02/2015 15:33    Past Surgical History Malachy Moan, RMA; 10/08/2016 11:26 AM) Breast Biopsy Left. Foot Surgery Left. Tonsillectomy  Diagnostic Studies History Malachy Moan, [place]; 10/08/2016 11:26 AM) Colonoscopy 5-10 years ago Mammogram within last year Pap Smear 1-5 years ago  Allergies Malachy Moan, RMA; 10/08/2016 11:31 AM) Allergies Reconciled Sulfa Antibiotics  Medication History Malachy Moan, RMA; 10/08/2016 11:29 AM) Celecoxib (200MG  Capsule, Oral) Active. Cyclobenzaprine HCl (5MG  Tablet, Oral) Active. Levothyroxine Sodium (125MCG Tablet, Oral) Active. Lisinopril (5MG  Tablet, Oral) Active. TraZODone HCl (50MG  Tablet, Oral) Active. Famotidine (20MG  Tablet, Oral)  Active. HydroCHLOROthiazide (12.5MG  Capsule, Oral) Active. Restasis (0.05% Emulsion, Ophthalmic) Active. Medications Reconciled  Social History Malachy Moan, [place]; 10/08/2016 11:26 AM) Alcohol use Occasional alcohol use. Caffeine use Coffee. No drug use Tobacco use Former smoker.  Family History Malachy Moan, [place]; 10/08/2016 11:26 AM) Alcohol Abuse Mother. Heart Disease Mother. Kidney Disease Mother.  Pregnancy / Birth History Malachy Moan, [place]; 10/08/2016 11:26 AM) Age at menarche 46 years. Age of menopause <46 Gravida 0 Para 0  Other Problems Malachy Moan, [place]; 10/08/2016 11:26 AM) Back Pain High blood pressure Lump In Breast     Review of Systems Malachy Moan RMA; 10/08/2016 11:26 AM) General Not Present- Appetite Loss, Chills, Fatigue, Fever, Night Sweats, Weight Gain and Weight Loss. Skin Not Present- Change in Wart/Mole, Dryness, Hives, Jaundice, New Lesions, Non-Healing Wounds, Rash and Ulcer. HEENT Not Present- Earache, Hearing Loss, Hoarseness, Nose Bleed, Oral Ulcers, Ringing in the Ears, Seasonal Allergies, Sinus Pain, Sore Throat, Visual Disturbances, Wears glasses/contact lenses and Yellow Eyes. Respiratory Not Present- Bloody sputum, Chronic Cough, Difficulty Breathing, Snoring and Wheezing. Breast Not Present- Breast Mass, Breast Pain, Nipple Discharge and Skin Changes. Gastrointestinal Present- Constipation. Not Present- Abdominal Pain, Bloating, Bloody Stool, Change in Bowel Habits, Chronic diarrhea, Difficulty Swallowing, Excessive gas, Gets full quickly at meals, Hemorrhoids, Indigestion, Nausea, Rectal Pain and Vomiting. Female Genitourinary Not Present- Frequency, Nocturia, Painful Urination, Pelvic Pain and Urgency. Musculoskeletal Present- Back Pain. Not Present- Joint Pain, Joint Stiffness, Muscle Pain, Muscle Weakness and Swelling of Extremities. Neurological Not Present- Decreased Memory, Fainting, Headaches,  Numbness, Seizures, Tingling, Tremor, Trouble walking and Weakness. Psychiatric Not Present- Anxiety, Bipolar, Change in Sleep Pattern, Depression, Fearful and Frequent crying. Endocrine Not Present- Cold Intolerance, Excessive Hunger, Hair Changes, Heat Intolerance, Hot flashes and New Diabetes. Hematology Not Present- Blood Thinners, Easy Bruising, Excessive bleeding, Gland problems, HIV and Persistent Infections.  Vitals Malachy Moan RMA; 10/08/2016 11:29 AM) 10/08/2016 11:29 AM Weight: 157 lb Height: 65in Body Surface  Area: 1.78 m Body Mass Index: 26.13 kg/m  Temp.: 98.43F  Pulse: 64 (Regular)  BP: 135/82 (Sitting, Left Arm, Standard)      Physical Exam Rodman Key K. Tawonna Esquer MD; 10/08/2016 12:23 PM)  The physical exam findings are as follows: Note:WDWN in NAD The patient has chronic low back pain and therefore does not like to sit down. Eyes: Pupils equal, round; sclera anicteric HENT: Oral mucosa moist; good dentition Neck: No masses palpated, no thyromegaly Lungs: CTA bilaterally; normal respiratory effort CV: Regular rate and rhythm; no murmurs; extremities well-perfused with no edema Abd: +bowel sounds, soft, non-tender, no palpable organomegaly; no palpable hernias Skin: Warm, dry; no sign of jaundice Psychiatric - alert and oriented x 4; calm mood and affect    Assessment & Plan Rodman Key K. Keevon Henney MD; 10/08/2016 12:06 PM)  GALLBLADDER POLYP (K82.4)  Current Plans Schedule for Surgery - Laparoscopic cholecystectomy with intraoperative cholangiogram. The surgical procedure has been discussed with the patient. Potential risks, benefits, alternative treatments, and expected outcomes have been explained. All of the patient's questions at this time have been answered. The likelihood of reaching the patient's treatment goal is good. The patient understand the proposed surgical procedure and wishes to proceed. Pt Education - Pamphlet Given - Laparoscopic  Gallbladder Surgery: discussed with patient and provided information.  Cristina Wilson. Georgette Dover, MD, [facility]  General/ Trauma Surgery  10/08/2016 12:24 PM

## 2016-10-13 NOTE — Op Note (Signed)
Laparoscopic Cholecystectomy with IOC Procedure Note  Indications: This is a 66 year old female who initially presented to Dr. Felipa Eth in September 2017 with some right upper quadrant abdominal pain. At that time he ordered a CT scan of the abdomen and pelvis that raised the suspicion of some possible gallbladder polyps versus stones. Ultrasound showed for small gallbladder wall polyps the largest measuring 5 mm. She had a one-year follow-up ultrasound yesterday that showed that these polyps were larger with the largest now measuring 7 mm. Liver function tests are normal. The patient's right upper quadrant abdominal pain has resolved. Last year she did not really experience any nausea vomiting or diarrhea with her abdominal pain. However, due to the size of the polyps and the fact that they are enlarging, she is now referred for cholecystectomy for diagnosis.  Pre-operative Diagnosis: Gallbladder polyps  Post-operative Diagnosis: Chronic cholecystitis  Surgeon: Dainel Arcidiacono K.   Assistants: none  Anesthesia: General endotracheal anesthesia  ASA Class: 2  Procedure Details  The patient was seen again in the Holding Room. The risks, benefits, complications, treatment options, and expected outcomes were discussed with the patient. The possibilities of reaction to medication, pulmonary aspiration, perforation of viscus, bleeding, recurrent infection, finding a normal gallbladder, the need for additional procedures, failure to diagnose a condition, the possible need to convert to an open procedure, and creating a complication requiring transfusion or operation were discussed with the patient. The likelihood of improving the patient's symptoms with return to their baseline status is good.  The patient and/or family concurred with the proposed plan, giving informed consent. The site of surgery properly noted. The patient was taken to Operating Room, identified as [name] and the  procedure verified as Laparoscopic Cholecystectomy with Intraoperative Cholangiogram. A Time Out was held and the above information confirmed.  Prior to the induction of general anesthesia, antibiotic prophylaxis was administered. General endotracheal anesthesia was then administered and tolerated well. After the induction, the abdomen was prepped with Chloraprep and draped in the sterile fashion. The patient was positioned in the supine position.  Local anesthetic agent was injected into the skin near the umbilicus and an incision made. We dissected down to the abdominal fascia with blunt dissection.  The fascia was incised vertically and we entered the peritoneal cavity bluntly.  A pursestring suture of 0-Vicryl was placed around the fascial opening.  The Hasson cannula was inserted and secured with the stay suture.  Pneumoperitoneum was then created with CO2 and tolerated well without any adverse changes in the patient's vital signs. An 11-mm port was placed in the subxiphoid position.  Two 5-mm ports were placed in the right upper quadrant. All skin incisions were infiltrated with a local anesthetic agent before making the incision and placing the trocars.   We positioned the patient in reverse Trendelenburg, tilted slightly to the patient's left.  The gallbladder was identified, the fundus grasped and retracted cephalad. There are significant omental adhesions to the surface of the gallbladder.  Adhesions were lysed bluntly and with the electrocautery where indicated, taking care not to injure any adjacent organs or viscus. The infundibulum was grasped and retracted laterally, exposing the peritoneum overlying the triangle of Calot. This was then divided and exposed in a blunt fashion. A critical view of the cystic duct and cystic artery was obtained.  The cystic duct was clearly identified and bluntly dissected circumferentially. The cystic duct was ligated with a clip distally.   An incision was made in  the cystic duct and  the [facility] cholangiogram catheter introduced. The catheter was secured using a clip. A cholangiogram was then obtained which showed good visualization of the distal and proximal biliary tree with no sign of filling defects or obstruction.  Contrast flowed easily into the duodenum. The catheter was then removed.   The cystic duct was then ligated with clips and divided. The cystic artery was identified, dissected free, ligated with clips and divided as well.   The gallbladder was dissected from the liver bed in retrograde fashion with the electrocautery. The gallbladder was removed and placed in an Endocatch sac. The liver bed was irrigated and inspected. Hemostasis was achieved with the electrocautery. Copious irrigation was utilized and was repeatedly aspirated until clear.  The gallbladder and Endocatch sac were then removed through the umbilical port site.  The pursestring suture was used to close the umbilical fascia.    We again inspected the right upper quadrant for hemostasis.  Pneumoperitoneum was released as we removed the trocars.  4-0 Monocryl was used to close the skin.   Benzoin, steri-strips, and clean dressings were applied. The patient was then extubated and brought to the recovery room in stable condition. Instrument, sponge, and needle counts were correct at closure and at the conclusion of the case.   Findings: Cholecystitis without Cholelithiasis  Estimated Blood Loss: less than 50 mL         Drains: none         Specimens: Gallbladder           Complications: None; patient tolerated the procedure well.         Disposition: PACU - hemodynamically stable.         Condition: stable  Cristina Wilson. Cristina Dover, MD, [facility]  General/ Trauma Surgery  10/13/2016 4:06 PM

## 2016-10-13 NOTE — Transfer of Care (Signed)
Immediate Anesthesia Transfer of Care Note  Patient: Cristina Wilson  Procedure(s) Performed: LAPAROSCOPIC CHOLECYSTECTOMY WITH INTRAOPERATIVE CHOLANGIOGRAM (N/A )  Patient Location: PACU  Anesthesia Type:General  Level of Consciousness: awake, alert , oriented and patient cooperative  Airway & Oxygen Therapy: Patient Spontanous Breathing  Post-op Assessment: Report given to RN, Post -op Vital signs reviewed and stable and Patient moving all extremities  Post vital signs: Reviewed and stable  Last Vitals:  Vitals:   10/13/16 1244  BP: (!) 175/65  Pulse: 69  Resp: 18  Temp: 36.9 C  SpO2: 99%    Last Pain:  Vitals:   10/13/16 1244  TempSrc: Oral  PainSc:          Complications: No apparent anesthesia complications

## 2016-10-13 NOTE — Anesthesia Procedure Notes (Signed)
Procedure Name: Intubation Date/Time: 10/13/2016 3:00 PM Performed by: Greggory Stallion, Franciscojavier Wronski L Pre-anesthesia Checklist: Patient identified, Emergency Drugs available, Suction available and Patient being monitored Patient Re-evaluated:Patient Re-evaluated prior to induction Oxygen Delivery Method: Circle System Utilized Preoxygenation: Pre-oxygenation with 100% oxygen Induction Type: IV induction Ventilation: Mask ventilation without difficulty Laryngoscope Size: Mac and 3 Grade View: Grade I Tube type: Oral Tube size: 7.0 mm Number of attempts: 1 Airway Equipment and Method: Stylet and Oral airway Placement Confirmation: ETT inserted through vocal cords under direct vision,  positive ETCO2 and breath sounds checked- equal and bilateral Secured at: 21 cm Tube secured with: Tape Dental Injury: Teeth and Oropharynx as per pre-operative assessment

## 2016-10-13 NOTE — Interval H&P Note (Signed)
History and Physical Interval Note:  10/13/2016 1:35 PM  Cristina Wilson  has presented today for surgery, with the diagnosis of GALLBLADDER POLYPS   The various methods of treatment have been discussed with the patient and family. After consideration of risks, benefits and other options for treatment, the patient has consented to  Procedure(s): LAPAROSCOPIC CHOLECYSTECTOMY WITH INTRAOPERATIVE CHOLANGIOGRAM (N/A) as a surgical intervention .  The patient's history has been reviewed, patient examined, no change in status, stable for surgery.  I have reviewed the patient's chart and labs.  Questions were answered to the patient's satisfaction.     Jarl Sellitto K.

## 2016-10-13 NOTE — Anesthesia Preprocedure Evaluation (Signed)
Anesthesia Evaluation  Patient identified by MRN, date of birth, ID band Patient awake    History of Anesthesia Complications (+) PONV and history of anesthetic complications  Airway Mallampati: I       Dental no notable dental hx. (+) Teeth Intact   Pulmonary former smoker,    Pulmonary exam normal breath sounds clear to auscultation       Cardiovascular hypertension, Pt. on medications Normal cardiovascular exam Rhythm:Regular Rate:Normal     Neuro/Psych negative psych ROS   GI/Hepatic Neg liver ROS,   Endo/Other  Hypothyroidism   Renal/GU      Musculoskeletal   Abdominal Normal abdominal exam  (+)   Peds  Hematology negative hematology ROS (+)   Anesthesia Other Findings   Reproductive/Obstetrics                             Anesthesia Physical Anesthesia Plan  ASA: II  Anesthesia Plan: General   Post-op Pain Management:    Induction: Intravenous  PONV Risk Score and Plan: 4 or greater and Ondansetron, Dexamethasone, Midazolam, Scopolamine patch - Pre-op and Propofol infusion  Airway Management Planned: Oral ETT  Additional Equipment:   Intra-op Plan:   Post-operative Plan: Extubation in OR  Informed Consent: I have reviewed the patients History and Physical, chart, labs and discussed the procedure including the risks, benefits and alternatives for the proposed anesthesia with the patient or authorized representative who has indicated his/her understanding and acceptance.   Dental advisory given  Plan Discussed with: CRNA and Surgeon  Anesthesia Plan Comments:         Anesthesia Quick Evaluation

## 2016-10-13 NOTE — Discharge Instructions (Signed)
[facility] ______[facility] °LAPAROSCOPIC SURGERY: POST OP INSTRUCTIONS °Always review your discharge instruction sheet given to you by the facility where your surgery was performed. °IF YOU HAVE DISABILITY OR FAMILY LEAVE FORMS, YOU MUST BRING THEM TO THE OFFICE FOR PROCESSING.   °DO NOT GIVE THEM TO YOUR DOCTOR. ° °1. A prescription for pain medication may be given to you upon discharge.  Take your pain medication as prescribed, if needed.  If narcotic pain medicine is not needed, then you may take acetaminophen (Tylenol) or ibuprofen (Advil) as needed. °2. Take your usually prescribed medications unless otherwise directed. °3. If you need a refill on your pain medication, please contact your pharmacy.  They will contact our office to request authorization. Prescriptions will not be filled after 5pm or on week-ends. °4. You should follow a light diet the first few days after arrival home, such as soup and crackers, etc.  Be sure to include lots of fluids daily. °5. Most patients will experience some swelling and bruising in the area of the incisions.  Ice packs will help.  Swelling and bruising can take several days to resolve.  °6. It is common to experience some constipation if taking pain medication after surgery.  Increasing fluid intake and taking a stool softener (such as Colace) will usually help or prevent this problem from occurring.  A mild laxative (Milk of Magnesia or Miralax) should be taken according to package instructions if there are no bowel movements after 48 hours. °7. Unless discharge instructions indicate otherwise, you may remove your bandages 24-48 hours after surgery, and you may shower at that time.  You may have steri-strips (small skin tapes) in place directly over the incision.  These strips should be left on the skin for 7-10 days.  If your surgeon used skin glue on the incision, you may shower in 24 hours.  The glue will flake off over the next 2-3 weeks.  Any sutures or  staples will be removed at the office during your follow-up visit. °8. ACTIVITIES:  You may resume regular (light) daily activities beginning the next day--such as daily self-care, walking, climbing stairs--gradually increasing activities as tolerated.  You may have sexual intercourse when it is comfortable.  Refrain from any heavy lifting or straining until approved by your doctor. °a. You may drive when you are no longer taking prescription pain medication, you can comfortably wear a seatbelt, and you can safely maneuver your car and apply brakes. °b. RETURN TO WORK:  __________________________________________________________ °9. You should see your doctor in the office for a follow-up appointment approximately 2-3 weeks after your surgery.  Make sure that you call for this appointment within a day or two after you arrive home to insure a convenient appointment time. °10. OTHER INSTRUCTIONS: __________________________________________________________________________________________________________________________ __________________________________________________________________________________________________________________________ °WHEN TO CALL YOUR DOCTOR: °1. Fever over 101.0 °2. Inability to urinate °3. Continued bleeding from incision. °4. Increased pain, redness, or drainage from the incision. °5. Increasing abdominal pain ° °The clinic staff is available to answer your questions during regular business hours.  Please don’t hesitate to call and ask to speak to one of the nurses for clinical concerns.  If you have a medical emergency, go to the nearest emergency room or call 911.  A surgeon from [facility] is always on call at the hospital. °[address], [address], [place], [place]  [address] ? [address] [address], [place], [place]   [address] °[phone] ? [phone] ? FAX [phone] °Web site:   [email or web address] °

## 2016-10-14 ENCOUNTER — Encounter (HOSPITAL_COMMUNITY): Payer: Self-pay | Admitting: Surgery

## 2016-10-15 NOTE — Anesthesia Postprocedure Evaluation (Signed)
Anesthesia Post Note  Patient: Cristina Wilson  Procedure(s) Performed: LAPAROSCOPIC CHOLECYSTECTOMY WITH INTRAOPERATIVE CHOLANGIOGRAM (N/A )     Patient location during evaluation: PACU Anesthesia Type: General Level of consciousness: awake and alert Pain management: pain level controlled Vital Signs Assessment: post-procedure vital signs reviewed and stable Respiratory status: spontaneous breathing, nonlabored ventilation, respiratory function stable and patient connected to nasal cannula oxygen Cardiovascular status: blood pressure returned to baseline and stable Postop Assessment: no apparent nausea or vomiting Anesthetic complications: no    Last Vitals:  Vitals:   10/13/16 1745 10/13/16 1800  BP:  (!) 171/80  Pulse: 84 83  Resp: 14 12  Temp:    SpO2: 97% 96%    Last Pain:  Vitals:   10/13/16 1730  TempSrc:   PainSc: 5                  Britainy Kozub EDWARD

## 2016-12-20 DIAGNOSIS — M545 Low back pain: Secondary | ICD-10-CM | POA: Diagnosis not present

## 2016-12-20 DIAGNOSIS — M5136 Other intervertebral disc degeneration, lumbar region: Secondary | ICD-10-CM | POA: Diagnosis not present

## 2016-12-20 DIAGNOSIS — M533 Sacrococcygeal disorders, not elsewhere classified: Secondary | ICD-10-CM | POA: Diagnosis not present

## 2016-12-21 DIAGNOSIS — M545 Low back pain: Secondary | ICD-10-CM | POA: Diagnosis not present

## 2016-12-21 DIAGNOSIS — M5136 Other intervertebral disc degeneration, lumbar region: Secondary | ICD-10-CM | POA: Diagnosis not present

## 2016-12-24 DIAGNOSIS — I10 Essential (primary) hypertension: Secondary | ICD-10-CM | POA: Diagnosis not present

## 2016-12-24 DIAGNOSIS — E039 Hypothyroidism, unspecified: Secondary | ICD-10-CM | POA: Diagnosis not present

## 2016-12-24 DIAGNOSIS — M5430 Sciatica, unspecified side: Secondary | ICD-10-CM | POA: Diagnosis not present

## 2016-12-24 DIAGNOSIS — M5431 Sciatica, right side: Secondary | ICD-10-CM | POA: Diagnosis not present

## 2016-12-24 DIAGNOSIS — Z79899 Other long term (current) drug therapy: Secondary | ICD-10-CM | POA: Diagnosis not present

## 2016-12-24 DIAGNOSIS — E7800 Pure hypercholesterolemia, unspecified: Secondary | ICD-10-CM | POA: Diagnosis not present

## 2017-01-06 DIAGNOSIS — Z6827 Body mass index (BMI) 27.0-27.9, adult: Secondary | ICD-10-CM | POA: Diagnosis not present

## 2017-01-06 DIAGNOSIS — M533 Sacrococcygeal disorders, not elsewhere classified: Secondary | ICD-10-CM | POA: Diagnosis not present

## 2017-01-10 ENCOUNTER — Other Ambulatory Visit: Payer: Self-pay | Admitting: Neurological Surgery

## 2017-01-10 DIAGNOSIS — M533 Sacrococcygeal disorders, not elsewhere classified: Secondary | ICD-10-CM

## 2017-01-11 DIAGNOSIS — Z1231 Encounter for screening mammogram for malignant neoplasm of breast: Secondary | ICD-10-CM | POA: Diagnosis not present

## 2017-01-17 DIAGNOSIS — F4542 Pain disorder with related psychological factors: Secondary | ICD-10-CM | POA: Diagnosis not present

## 2017-01-17 DIAGNOSIS — M5136 Other intervertebral disc degeneration, lumbar region: Secondary | ICD-10-CM | POA: Diagnosis not present

## 2017-01-17 DIAGNOSIS — R69 Illness, unspecified: Secondary | ICD-10-CM | POA: Diagnosis not present

## 2017-01-17 DIAGNOSIS — M533 Sacrococcygeal disorders, not elsewhere classified: Secondary | ICD-10-CM | POA: Diagnosis not present

## 2017-01-19 ENCOUNTER — Ambulatory Visit
Admission: RE | Admit: 2017-01-19 | Discharge: 2017-01-19 | Disposition: A | Discharge status: Home / Self Care | Payer: BC Managed Care – PPO | Source: Ambulatory Visit | Attending: Neurological Surgery | Admitting: Neurological Surgery

## 2017-01-19 DIAGNOSIS — M533 Sacrococcygeal disorders, not elsewhere classified: Secondary | ICD-10-CM | POA: Diagnosis not present

## 2017-02-02 DIAGNOSIS — M533 Sacrococcygeal disorders, not elsewhere classified: Secondary | ICD-10-CM | POA: Diagnosis not present

## 2017-02-23 DIAGNOSIS — R69 Illness, unspecified: Secondary | ICD-10-CM | POA: Diagnosis not present

## 2017-02-24 DIAGNOSIS — G894 Chronic pain syndrome: Secondary | ICD-10-CM | POA: Diagnosis not present

## 2017-03-01 DIAGNOSIS — M5136 Other intervertebral disc degeneration, lumbar region: Secondary | ICD-10-CM | POA: Diagnosis not present

## 2017-03-01 DIAGNOSIS — M419 Scoliosis, unspecified: Secondary | ICD-10-CM

## 2017-03-01 DIAGNOSIS — M51369 Other intervertebral disc degeneration, lumbar region without mention of lumbar back pain or lower extremity pain: Secondary | ICD-10-CM

## 2017-03-01 DIAGNOSIS — M4186 Other forms of scoliosis, lumbar region: Secondary | ICD-10-CM | POA: Diagnosis not present

## 2017-03-01 HISTORY — DX: Scoliosis, unspecified: M41.9

## 2017-03-01 HISTORY — DX: Other intervertebral disc degeneration, lumbar region without mention of lumbar back pain or lower extremity pain: M51.369

## 2017-03-01 HISTORY — DX: Other intervertebral disc degeneration, lumbar region: M51.36

## 2017-03-02 DIAGNOSIS — M5136 Other intervertebral disc degeneration, lumbar region: Secondary | ICD-10-CM | POA: Diagnosis not present

## 2017-03-07 DIAGNOSIS — M5136 Other intervertebral disc degeneration, lumbar region: Secondary | ICD-10-CM | POA: Diagnosis not present

## 2017-03-07 DIAGNOSIS — M48061 Spinal stenosis, lumbar region without neurogenic claudication: Secondary | ICD-10-CM | POA: Diagnosis not present

## 2017-03-11 ENCOUNTER — Other Ambulatory Visit: Payer: Self-pay | Admitting: Orthopedic Surgery

## 2017-03-11 DIAGNOSIS — M48061 Spinal stenosis, lumbar region without neurogenic claudication: Secondary | ICD-10-CM

## 2017-03-12 DIAGNOSIS — R300 Dysuria: Secondary | ICD-10-CM | POA: Diagnosis not present

## 2017-03-12 DIAGNOSIS — N390 Urinary tract infection, site not specified: Secondary | ICD-10-CM | POA: Diagnosis not present

## 2017-03-12 DIAGNOSIS — R319 Hematuria, unspecified: Secondary | ICD-10-CM | POA: Diagnosis not present

## 2017-03-15 ENCOUNTER — Other Ambulatory Visit: Payer: Self-pay | Admitting: Geriatric Medicine

## 2017-03-15 ENCOUNTER — Other Ambulatory Visit (HOSPITAL_COMMUNITY)
Admission: RE | Admit: 2017-03-15 | Discharge: 2017-03-15 | Disposition: A | Discharge status: Home / Self Care | Payer: Medicare HMO | Source: Ambulatory Visit | Attending: Geriatric Medicine | Admitting: Geriatric Medicine

## 2017-03-15 DIAGNOSIS — Z124 Encounter for screening for malignant neoplasm of cervix: Secondary | ICD-10-CM | POA: Diagnosis not present

## 2017-03-15 DIAGNOSIS — G8929 Other chronic pain: Secondary | ICD-10-CM | POA: Diagnosis not present

## 2017-03-15 DIAGNOSIS — E039 Hypothyroidism, unspecified: Secondary | ICD-10-CM | POA: Diagnosis not present

## 2017-03-15 DIAGNOSIS — Z23 Encounter for immunization: Secondary | ICD-10-CM | POA: Diagnosis not present

## 2017-03-15 DIAGNOSIS — E7800 Pure hypercholesterolemia, unspecified: Secondary | ICD-10-CM | POA: Diagnosis not present

## 2017-03-15 DIAGNOSIS — M8580 Other specified disorders of bone density and structure, unspecified site: Secondary | ICD-10-CM | POA: Diagnosis not present

## 2017-03-15 DIAGNOSIS — I700 Atherosclerosis of aorta: Secondary | ICD-10-CM | POA: Diagnosis not present

## 2017-03-15 DIAGNOSIS — Z1389 Encounter for screening for other disorder: Secondary | ICD-10-CM | POA: Diagnosis not present

## 2017-03-15 DIAGNOSIS — M5441 Lumbago with sciatica, right side: Secondary | ICD-10-CM | POA: Diagnosis not present

## 2017-03-15 DIAGNOSIS — Z0000 Encounter for general adult medical examination without abnormal findings: Secondary | ICD-10-CM | POA: Diagnosis not present

## 2017-03-15 DIAGNOSIS — Z79899 Other long term (current) drug therapy: Secondary | ICD-10-CM | POA: Diagnosis not present

## 2017-03-15 DIAGNOSIS — R69 Illness, unspecified: Secondary | ICD-10-CM | POA: Diagnosis not present

## 2017-03-15 DIAGNOSIS — I10 Essential (primary) hypertension: Secondary | ICD-10-CM | POA: Diagnosis not present

## 2017-03-17 DIAGNOSIS — S52502A Unspecified fracture of the lower end of left radius, initial encounter for closed fracture: Secondary | ICD-10-CM | POA: Diagnosis not present

## 2017-03-17 DIAGNOSIS — S52121A Displaced fracture of head of right radius, initial encounter for closed fracture: Secondary | ICD-10-CM | POA: Diagnosis not present

## 2017-03-17 DIAGNOSIS — M25532 Pain in left wrist: Secondary | ICD-10-CM | POA: Diagnosis not present

## 2017-03-17 DIAGNOSIS — S52512A Displaced fracture of left radial styloid process, initial encounter for closed fracture: Secondary | ICD-10-CM | POA: Diagnosis not present

## 2017-03-17 DIAGNOSIS — S52124A Nondisplaced fracture of head of right radius, initial encounter for closed fracture: Secondary | ICD-10-CM | POA: Diagnosis not present

## 2017-03-17 DIAGNOSIS — M25521 Pain in right elbow: Secondary | ICD-10-CM | POA: Diagnosis not present

## 2017-03-18 ENCOUNTER — Other Ambulatory Visit: Payer: Self-pay | Admitting: Orthopaedic Surgery

## 2017-03-18 DIAGNOSIS — S52124D Nondisplaced fracture of head of right radius, subsequent encounter for closed fracture with routine healing: Secondary | ICD-10-CM | POA: Diagnosis not present

## 2017-03-18 DIAGNOSIS — M25532 Pain in left wrist: Secondary | ICD-10-CM

## 2017-03-18 LAB — CYTOLOGY - PAP
Diagnosis: NEGATIVE
HPV 16/18/45 genotyping: NEGATIVE
HPV: DETECTED — AB

## 2017-03-22 ENCOUNTER — Ambulatory Visit
Admission: RE | Admit: 2017-03-22 | Discharge: 2017-03-22 | Disposition: A | Discharge status: Home / Self Care | Payer: BC Managed Care – PPO | Source: Ambulatory Visit | Attending: Orthopaedic Surgery | Admitting: Orthopaedic Surgery

## 2017-03-22 DIAGNOSIS — M25532 Pain in left wrist: Secondary | ICD-10-CM

## 2017-03-22 DIAGNOSIS — R937 Abnormal findings on diagnostic imaging of other parts of musculoskeletal system: Secondary | ICD-10-CM | POA: Diagnosis not present

## 2017-03-22 DIAGNOSIS — S52592A Other fractures of lower end of left radius, initial encounter for closed fracture: Secondary | ICD-10-CM | POA: Diagnosis not present

## 2017-03-24 DIAGNOSIS — S52124D Nondisplaced fracture of head of right radius, subsequent encounter for closed fracture with routine healing: Secondary | ICD-10-CM | POA: Diagnosis not present

## 2017-03-28 ENCOUNTER — Other Ambulatory Visit: Payer: Self-pay | Admitting: Orthopedic Surgery

## 2017-03-28 ENCOUNTER — Encounter (HOSPITAL_BASED_OUTPATIENT_CLINIC_OR_DEPARTMENT_OTHER): Payer: Self-pay | Admitting: *Deleted

## 2017-03-28 ENCOUNTER — Other Ambulatory Visit: Payer: Self-pay

## 2017-03-28 ENCOUNTER — Inpatient Hospital Stay
Admission: RE | Admit: 2017-03-28 | Discharge: 2017-03-28 | Disposition: A | Discharge status: Home / Self Care | Payer: BC Managed Care – PPO | Source: Ambulatory Visit | Attending: Orthopedic Surgery | Admitting: Orthopedic Surgery

## 2017-03-28 ENCOUNTER — Other Ambulatory Visit: Payer: BC Managed Care – PPO

## 2017-03-28 DIAGNOSIS — S52572A Other intraarticular fracture of lower end of left radius, initial encounter for closed fracture: Secondary | ICD-10-CM | POA: Diagnosis not present

## 2017-03-29 ENCOUNTER — Encounter (HOSPITAL_BASED_OUTPATIENT_CLINIC_OR_DEPARTMENT_OTHER)
Admission: RE | Disposition: A | Discharge status: Home / Self Care | Payer: Self-pay | Source: Ambulatory Visit | Attending: Orthopedic Surgery

## 2017-03-29 ENCOUNTER — Encounter (HOSPITAL_BASED_OUTPATIENT_CLINIC_OR_DEPARTMENT_OTHER): Payer: Self-pay | Admitting: Certified Registered"

## 2017-03-29 ENCOUNTER — Ambulatory Visit (HOSPITAL_BASED_OUTPATIENT_CLINIC_OR_DEPARTMENT_OTHER)
Admission: RE | Admit: 2017-03-29 | Discharge: 2017-03-29 | Disposition: A | Discharge status: Home / Self Care | Payer: Medicare HMO | Source: Ambulatory Visit | Attending: Orthopedic Surgery | Admitting: Orthopedic Surgery

## 2017-03-29 ENCOUNTER — Ambulatory Visit (HOSPITAL_BASED_OUTPATIENT_CLINIC_OR_DEPARTMENT_OTHER): Payer: Medicare HMO | Admitting: Anesthesiology

## 2017-03-29 DIAGNOSIS — Y929 Unspecified place or not applicable: Secondary | ICD-10-CM | POA: Diagnosis not present

## 2017-03-29 DIAGNOSIS — K219 Gastro-esophageal reflux disease without esophagitis: Secondary | ICD-10-CM | POA: Insufficient documentation

## 2017-03-29 DIAGNOSIS — S52572A Other intraarticular fracture of lower end of left radius, initial encounter for closed fracture: Secondary | ICD-10-CM | POA: Diagnosis not present

## 2017-03-29 DIAGNOSIS — G8918 Other acute postprocedural pain: Secondary | ICD-10-CM | POA: Diagnosis not present

## 2017-03-29 DIAGNOSIS — Z7982 Long term (current) use of aspirin: Secondary | ICD-10-CM | POA: Diagnosis not present

## 2017-03-29 DIAGNOSIS — I10 Essential (primary) hypertension: Secondary | ICD-10-CM | POA: Diagnosis not present

## 2017-03-29 DIAGNOSIS — Z87891 Personal history of nicotine dependence: Secondary | ICD-10-CM | POA: Insufficient documentation

## 2017-03-29 DIAGNOSIS — Z882 Allergy status to sulfonamides status: Secondary | ICD-10-CM | POA: Diagnosis not present

## 2017-03-29 DIAGNOSIS — Z79899 Other long term (current) drug therapy: Secondary | ICD-10-CM | POA: Insufficient documentation

## 2017-03-29 DIAGNOSIS — E039 Hypothyroidism, unspecified: Secondary | ICD-10-CM | POA: Insufficient documentation

## 2017-03-29 DIAGNOSIS — W1830XA Fall on same level, unspecified, initial encounter: Secondary | ICD-10-CM | POA: Insufficient documentation

## 2017-03-29 HISTORY — DX: Unspecified fracture of the lower end of left radius, initial encounter for closed fracture: S52.502A

## 2017-03-29 HISTORY — DX: Hypothyroidism, unspecified: E03.9

## 2017-03-29 HISTORY — PX: OPEN REDUCTION INTERNAL FIXATION (ORIF) DISTAL RADIAL FRACTURE: SHX5989

## 2017-03-29 SURGERY — OPEN REDUCTION INTERNAL FIXATION (ORIF) DISTAL RADIUS FRACTURE
Anesthesia: Regional | Site: Wrist | Laterality: Left

## 2017-03-29 MED ORDER — OXYCODONE-ACETAMINOPHEN 5-325 MG PO TABS: ORAL_TABLET | ORAL | 0 refills | Status: DC

## 2017-03-29 MED ORDER — ONDANSETRON HCL 4 MG/2ML IJ SOLN
4.0000 mg | Freq: Once | INTRAMUSCULAR | Status: AC | PRN
  Administered 2017-03-29: 4 mg via INTRAVENOUS

## 2017-03-29 MED ORDER — MIDAZOLAM HCL 2 MG/2ML IJ SOLN
INTRAMUSCULAR | Status: AC
  Filled 2017-03-29: qty 2

## 2017-03-29 MED ORDER — ONDANSETRON HCL 4 MG/2ML IJ SOLN
INTRAMUSCULAR | Status: DC | PRN
  Administered 2017-03-29: 4 mg via INTRAVENOUS

## 2017-03-29 MED ORDER — MIDAZOLAM HCL 2 MG/2ML IJ SOLN: 1.0000 mg | INTRAMUSCULAR | Status: DC | PRN

## 2017-03-29 MED ORDER — FENTANYL CITRATE (PF) 100 MCG/2ML IJ SOLN
INTRAMUSCULAR | Status: AC
  Filled 2017-03-29: qty 2

## 2017-03-29 MED ORDER — FENTANYL CITRATE (PF) 100 MCG/2ML IJ SOLN
50.0000 ug | INTRAMUSCULAR | Status: AC | PRN
  Administered 2017-03-29 (×4): 25 ug via INTRAVENOUS

## 2017-03-29 MED ORDER — ROPIVACAINE HCL 7.5 MG/ML IJ SOLN
INTRAMUSCULAR | Status: DC | PRN
  Administered 2017-03-29: 20 mL via PERINEURAL

## 2017-03-29 MED ORDER — CEFAZOLIN SODIUM-DEXTROSE 2-4 GM/100ML-% IV SOLN
INTRAVENOUS | Status: AC
  Filled 2017-03-29: qty 100

## 2017-03-29 MED ORDER — PROPOFOL 500 MG/50ML IV EMUL
INTRAVENOUS | Status: DC | PRN
  Administered 2017-03-29: 50 ug/kg/min via INTRAVENOUS

## 2017-03-29 MED ORDER — ONDANSETRON HCL 4 MG/2ML IJ SOLN
INTRAMUSCULAR | Status: AC
  Filled 2017-03-29: qty 2

## 2017-03-29 MED ORDER — NEOSTIGMINE METHYLSULFATE 5 MG/5ML IV SOSY
PREFILLED_SYRINGE | INTRAVENOUS | Status: AC
  Filled 2017-03-29: qty 5

## 2017-03-29 MED ORDER — CHLORHEXIDINE GLUCONATE 4 % EX LIQD: 60.0000 mL | Freq: Once | CUTANEOUS | Status: DC

## 2017-03-29 MED ORDER — SCOPOLAMINE 1 MG/3DAYS TD PT72: 1.0000 | MEDICATED_PATCH | Freq: Once | TRANSDERMAL | Status: DC | PRN

## 2017-03-29 MED ORDER — FENTANYL CITRATE (PF) 100 MCG/2ML IJ SOLN
25.0000 ug | INTRAMUSCULAR | Status: DC | PRN
  Administered 2017-03-29: 50 ug via INTRAVENOUS
  Administered 2017-03-29 (×2): 25 ug via INTRAVENOUS

## 2017-03-29 MED ORDER — BUPIVACAINE HCL (PF) 0.25 % IJ SOLN
INTRAMUSCULAR | Status: DC | PRN
  Administered 2017-03-29: 6 mL

## 2017-03-29 MED ORDER — LIDOCAINE HCL (CARDIAC) 20 MG/ML IV SOLN
INTRAVENOUS | Status: DC | PRN
  Administered 2017-03-29: 30 mg via INTRAVENOUS

## 2017-03-29 MED ORDER — LACTATED RINGERS IV SOLN
INTRAVENOUS | Status: DC
  Administered 2017-03-29 (×3): via INTRAVENOUS

## 2017-03-29 MED ORDER — PROPOFOL 500 MG/50ML IV EMUL
INTRAVENOUS | Status: AC
  Filled 2017-03-29: qty 50

## 2017-03-29 MED ORDER — CEFAZOLIN SODIUM-DEXTROSE 2-4 GM/100ML-% IV SOLN
2.0000 g | INTRAVENOUS | Status: AC
  Administered 2017-03-29: 2 g via INTRAVENOUS

## 2017-03-29 SURGICAL SUPPLY — 59 items
BANDAGE ACE 3X5.8 VEL STRL LF (GAUZE/BANDAGES/DRESSINGS) ×3 IMPLANT
BIT DRILL 2.0 LNG QUCK RELEASE (BIT) ×1 IMPLANT
BIT DRILL 2.8X5 QR DISP (BIT) ×3 IMPLANT
BLADE SURG 15 STRL LF DISP TIS (BLADE) ×2 IMPLANT
BLADE SURG 15 STRL SS (BLADE) ×4
BNDG ESMARK 4X9 LF (GAUZE/BANDAGES/DRESSINGS) ×3 IMPLANT
BNDG GAUZE ELAST 4 BULKY (GAUZE/BANDAGES/DRESSINGS) ×3 IMPLANT
BNDG PLASTER X FAST 3X3 WHT LF (CAST SUPPLIES) ×30 IMPLANT
BONE CHIP PRESERV 5CC PCAN5 (Bone Implant) ×3 IMPLANT
CHLORAPREP W/TINT 26ML (MISCELLANEOUS) ×3 IMPLANT
CORD BIPOLAR FORCEPS 12FT (ELECTRODE) ×3 IMPLANT
COVER BACK TABLE 60X90IN (DRAPES) ×3 IMPLANT
COVER MAYO STAND STRL (DRAPES) ×3 IMPLANT
CUFF TOURNIQUET SINGLE 18IN (TOURNIQUET CUFF) ×3 IMPLANT
CUFF TOURNIQUET SINGLE 24IN (TOURNIQUET CUFF) IMPLANT
DRAPE EXTREMITY T 121X128X90 (DRAPE) ×3 IMPLANT
DRAPE OEC MINIVIEW 54X84 (DRAPES) ×3 IMPLANT
DRAPE SURG 17X23 STRL (DRAPES) ×3 IMPLANT
DRILL 2.0 LNG QUICK RELEASE (BIT) ×3
GAUZE SPONGE 4X4 12PLY STRL (GAUZE/BANDAGES/DRESSINGS) ×3 IMPLANT
GAUZE XEROFORM 1X8 LF (GAUZE/BANDAGES/DRESSINGS) ×3 IMPLANT
GLOVE BIO SURGEON STRL SZ7.5 (GLOVE) ×3 IMPLANT
GLOVE BIOGEL PI IND STRL 7.0 (GLOVE) ×2 IMPLANT
GLOVE BIOGEL PI IND STRL 8 (GLOVE) ×1 IMPLANT
GLOVE BIOGEL PI IND STRL 8.5 (GLOVE) ×1 IMPLANT
GLOVE BIOGEL PI INDICATOR 7.0 (GLOVE) ×4
GLOVE BIOGEL PI INDICATOR 8 (GLOVE) ×2
GLOVE BIOGEL PI INDICATOR 8.5 (GLOVE) ×2
GLOVE ECLIPSE 6.5 STRL STRAW (GLOVE) ×6 IMPLANT
GLOVE SURG ORTHO 8.0 STRL STRW (GLOVE) ×3 IMPLANT
GOWN STRL REUS W/ TWL LRG LVL3 (GOWN DISPOSABLE) ×1 IMPLANT
GOWN STRL REUS W/TWL LRG LVL3 (GOWN DISPOSABLE) ×2
GOWN STRL REUS W/TWL XL LVL3 (GOWN DISPOSABLE) ×3 IMPLANT
GUIDEWIRE ORTHO 0.054X6 (WIRE) ×9 IMPLANT
NEEDLE HYPO 25X1 1.5 SAFETY (NEEDLE) IMPLANT
NS IRRIG 1000ML POUR BTL (IV SOLUTION) ×3 IMPLANT
PACK BASIN DAY SURGERY FS (CUSTOM PROCEDURE TRAY) ×3 IMPLANT
PAD CAST 3X4 CTTN HI CHSV (CAST SUPPLIES) ×1 IMPLANT
PADDING CAST COTTON 3X4 STRL (CAST SUPPLIES) ×2
PLATE ACU LOC PROX STD LEFT (Plate) ×3 IMPLANT
SCREW CORT FT 18X2.3XLCK HEX (Screw) ×2 IMPLANT
SCREW CORTICAL LOCKING 2.3X18M (Screw) ×6 IMPLANT
SCREW CORTICAL LOCKING 2.3X20M (Screw) ×2 IMPLANT
SCREW CORTICAL LOCKING 2.3X22M (Screw) ×4 IMPLANT
SCREW FX18X2.3XSMTH LCK NS CRT (Screw) ×1 IMPLANT
SCREW FX20X2.3XSMTH LCK NS CRT (Screw) ×1 IMPLANT
SCREW FX22X2.3XLCK SMTH NS CRT (Screw) ×2 IMPLANT
SCREW NLCKG 13 3.5X13 HEXA (Screw) ×1 IMPLANT
SCREW NON-LOCK 3.5X13 (Screw) ×3 IMPLANT
SCREW NONLOCK HEX 3.5X12 (Screw) ×6 IMPLANT
SLEEVE SCD COMPRESS KNEE MED (MISCELLANEOUS) ×3 IMPLANT
STOCKINETTE 4X48 STRL (DRAPES) ×3 IMPLANT
SUT ETHILON 4 0 PS 2 18 (SUTURE) ×3 IMPLANT
SUT VICRYL 4-0 PS2 18IN ABS (SUTURE) ×3 IMPLANT
SYR BULB 3OZ (MISCELLANEOUS) ×3 IMPLANT
SYR CONTROL 10ML LL (SYRINGE) IMPLANT
TOWEL OR 17X24 6PK STRL BLUE (TOWEL DISPOSABLE) ×6 IMPLANT
TOWEL OR NON WOVEN STRL DISP B (DISPOSABLE) ×3 IMPLANT
UNDERPAD 30X30 (UNDERPADS AND DIAPERS) ×3 IMPLANT

## 2017-03-29 NOTE — Progress Notes (Signed)
Assisted Dr. Turk with left, ultrasound guided, supraclavicular block. Side rails up, monitors on throughout procedure. See vital signs in flow sheet. Tolerated Procedure well. 

## 2017-03-29 NOTE — H&P (Signed)
Cristina Wilson is an 67 y.o. female.   Chief Complaint: left distal radius fracture HPI: 67 yo female states she fell from a standing height injuring left wrist on 03/17/17.  Seen by Dr. Griffin Basil and splinted.  XR/CT show comminuted intraarticular distal radius fracture.  She wishes to proceed with operative fixation.  Allergies:  Allergies  Allergen Reactions  . Sulfa Antibiotics Rash    Past Medical History:  Diagnosis Date  . Arthritis   . Bell's palsy    "mild case"  . Cerebral aneurysm 08/2014   pt. states she has 2 aneurysms  . Closed fracture of left distal radius   . GERD (gastroesophageal reflux disease)   . Hypertension   . Hypothyroidism   . PONV (postoperative nausea and vomiting)    violent vomiting  . Thyroid disease     Past Surgical History:  Procedure Laterality Date  . ARTERY BIOPSY Right 10/31/2014   Procedure: BIOPSY TEMPORAL ARTERY-RIGHT;  Surgeon: Mal Misty, MD;  Location: [place];  Service: Vascular;  Laterality: Right;  . BILATERAL CARPAL TUNNEL RELEASE    . BREAST SURGERY Left    lumpectomy  . BUNIONECTOMY Right   . CHOLECYSTECTOMY N/A 10/13/2016   Procedure: LAPAROSCOPIC CHOLECYSTECTOMY WITH INTRAOPERATIVE CHOLANGIOGRAM;  Surgeon: Donnie Mesa, MD;  Location: [place];  Service: General;  Laterality: N/A;  . EYE SURGERY     surgery for cross eye  . FOOT FRACTURE SURGERY    . THYROIDECTOMY    . TONSILLECTOMY    . WISDOM TOOTH EXTRACTION      Family History: Family History  Problem Relation Age of Onset  . COPD Mother   . Kidney disease Mother   . Heart disease Mother   . Cancer Father     Social History:   reports that she quit smoking about 27 years ago. Her smoking use included cigarettes. She quit after 0.50 years of use. She has never used smokeless tobacco. She reports that she drinks about 4.2 oz of alcohol per week. She reports that she has current or past drug history. Drug: Marijuana.  Medications: Medications Prior to  Admission  Medication Sig Dispense Refill  . aspirin 81 MG tablet Take 81 mg by mouth at bedtime.     . cholecalciferol (VITAMIN D) 1000 units tablet Take 1,000 Units by mouth daily.    Marland Kitchen levothyroxine (SYNTHROID, LEVOTHROID) 125 MCG tablet Take 125 mcg by mouth daily before breakfast.     . lisinopril (PRINIVIL,ZESTRIL) 5 MG tablet Take 5 mg by mouth at bedtime.     Marland Kitchen loratadine (CLARITIN) 10 MG tablet Take 10 mg by mouth at bedtime.     . Magnesium Oxide (MAG-OX 400 PO) Take 1 tablet by mouth daily.    . meloxicam (MOBIC) 15 MG tablet Take 15 mg by mouth daily.    . Multiple Vitamins-Minerals (MULTIVITAMIN WITH MINERALS) tablet Take 1 tablet by mouth daily.    . Omega-3 Fatty Acids (FISH OIL) 1000 MG CAPS Take 1 capsule by mouth daily.    . Turmeric 500 MG CAPS Take 1 capsule by mouth daily.      No results found for this or any previous visit (from the past 48 hour(s)).  No results found.   A comprehensive review of systems was negative.  Blood pressure 138/88, pulse 61, temperature 98 F (36.7 C), temperature source Oral, resp. rate (!) 9, height 5\' 5"  (1.651 m), weight 71.7 kg (158 lb), SpO2 100 %.  General appearance: alert, cooperative  and appears stated age Head: Normocephalic, without obvious abnormality, atraumatic Neck: supple, symmetrical, trachea midline Cardio: regular rate and rhythm Resp: clear to auscultation bilaterally Extremities: Intact sensation and capillary refill all digits.  +epl/fpl/io.  No wounds.  Pulses: 2+ and symmetric Skin: Skin color, texture, turgor normal. No rashes or lesions Neurologic: Grossly normal Incision/Wound: none  Assessment/Plan Left distal radius fracture.  Non operative and operative treatment options were discussed with the patient and patient wishes to proceed with operative treatment. Risks, benefits, and alternatives of surgery were discussed and the patient agrees with the plan of care.   Tabias Swayze R 03/29/2017, 2:35  PM

## 2017-03-29 NOTE — Transfer of Care (Signed)
Immediate Anesthesia Transfer of Care Note  Patient: Cristina Wilson  Procedure(s) Performed: OPEN REDUCTION INTERNAL FIXATION (ORIF)LEFT  DISTAL RADIAL FRACTURE (Left Wrist)  Patient Location: PACU  Anesthesia Type:GA combined with regional for post-op pain  Level of Consciousness: awake, alert , oriented and patient cooperative  Airway & Oxygen Therapy: Patient Spontanous Breathing and Patient connected to face mask oxygen  Post-op Assessment: Report given to RN and Post -op Vital signs reviewed and stable  Post vital signs: Reviewed and stable  Last Vitals:  Vitals Value Taken Time  BP    Temp    Pulse    Resp    SpO2      Last Pain:  Vitals:   03/29/17 1402  TempSrc: Oral  PainSc: 3          Complications: No apparent anesthesia complications

## 2017-03-29 NOTE — Op Note (Signed)
I assisted Surgeon(s) and Role:    * Leanora Cover, MD - Primary    Daryll Brod, MD - Assisting on the Procedure(s): OPEN REDUCTION INTERNAL FIXATION (ORIF)LEFT  DISTAL RADIAL FRACTURE on 03/29/2017.  I provided assistance on this case as follows: setup approach, debridement, reduction bone grafting stabilization, fixation, closure and application of the dressings and splint.  Electronically signed by: Wynonia Sours, MD Date: 03/29/2017 Time: 4:29 PM

## 2017-03-29 NOTE — Anesthesia Procedure Notes (Signed)
Procedure Name: LMA Insertion Date/Time: 03/29/2017 3:25 PM Performed by: Signe Colt, CRNA Pre-anesthesia Checklist: Patient identified, Emergency Drugs available, Suction available and Patient being monitored Patient Re-evaluated:Patient Re-evaluated prior to induction Oxygen Delivery Method: Circle system utilized Preoxygenation: Pre-oxygenation with 100% oxygen Induction Type: IV induction Ventilation: Mask ventilation without difficulty LMA: LMA inserted LMA Size: 4.0 Number of attempts: 1 Airway Equipment and Method: Bite block Placement Confirmation: positive ETCO2 Tube secured with: Tape Dental Injury: Teeth and Oropharynx as per pre-operative assessment

## 2017-03-29 NOTE — Anesthesia Procedure Notes (Signed)
Procedure Name: MAC Date/Time: 03/29/2017 3:17 PM Performed by: Signe Colt, CRNA Pre-anesthesia Checklist: Patient identified, Suction available, Emergency Drugs available, Patient being monitored and Timeout performed Patient Re-evaluated:Patient Re-evaluated prior to induction Oxygen Delivery Method: Simple face mask

## 2017-03-29 NOTE — Brief Op Note (Signed)
03/29/2017  4:29 PM  PATIENT:  Mayo Ao  67 y.o. female  PRE-OPERATIVE DIAGNOSIS:  LEFT DISTAL RADIUS FRACTURE  POST-OPERATIVE DIAGNOSIS:  LEFT DISTAL RADIUS FRACTURE  PROCEDURE:  Procedure(s): OPEN REDUCTION INTERNAL FIXATION (ORIF)LEFT  DISTAL RADIAL FRACTURE (Left)  SURGEON:  Surgeon(s) and Role:    * Leanora Cover, MD - Primary    * Daryll Brod, MD - Assisting  PHYSICIAN ASSISTANT:   ASSISTANTS: Daryll Brod, MD   ANESTHESIA:   regional and general  EBL:  Minimal   BLOOD ADMINISTERED:none  DRAINS: none   LOCAL MEDICATIONS USED:  MARCAINE     SPECIMEN:  No Specimen  DISPOSITION OF SPECIMEN:  N/A  COUNTS:  YES  TOURNIQUET:   Total Tourniquet Time Documented: Upper Arm (Left) - 57 minutes Total: Upper Arm (Left) - 57 minutes   DICTATION: .Note written in EPIC  PLAN OF CARE: Discharge to home after PACU  PATIENT DISPOSITION:  PACU - hemodynamically stable.

## 2017-03-29 NOTE — Anesthesia Preprocedure Evaluation (Addendum)
Anesthesia Evaluation  Patient identified by MRN, date of birth, ID band Patient awake    Reviewed: Allergy & Precautions, NPO status , Patient's Chart, lab work & pertinent test results  History of Anesthesia Complications (+) PONV and history of anesthetic complications  Airway Mallampati: II  TM Distance: >3 FB Neck ROM: Full    Dental  (+) Teeth Intact, Dental Advisory Given   Pulmonary former smoker,    Pulmonary exam normal breath sounds clear to auscultation       Cardiovascular Exercise Tolerance: Good hypertension, Normal cardiovascular exam Rhythm:Regular Rate:Normal     Neuro/Psych  Headaches, Aneurysm x2  Neuromuscular disease negative psych ROS   GI/Hepatic Neg liver ROS, GERD  ,  Endo/Other  Hypothyroidism   Renal/GU negative Renal ROS     Musculoskeletal  (+) Arthritis , Osteoarthritis,    Abdominal   Peds  Hematology negative hematology ROS (+)   Anesthesia Other Findings Day of surgery medications reviewed with the patient.  Reproductive/Obstetrics                             Anesthesia Physical Anesthesia Plan  ASA: II  Anesthesia Plan: Regional   Post-op Pain Management:    Induction:   PONV Risk Score and Plan: 3 and Treatment may vary due to age or medical condition, TIVA, Dexamethasone and Ondansetron  Airway Management Planned: LMA  Additional Equipment:   Intra-op Plan:   Post-operative Plan:   Informed Consent: I have reviewed the patients History and Physical, chart, labs and discussed the procedure including the risks, benefits and alternatives for the proposed anesthesia with the patient or authorized representative who has indicated his/her understanding and acceptance.   Dental advisory given  Plan Discussed with:   Anesthesia Plan Comments:       Anesthesia Quick Evaluation

## 2017-03-29 NOTE — Anesthesia Procedure Notes (Signed)
Anesthesia Regional Block: Supraclavicular block   Pre-Anesthetic Checklist: ,, timeout performed, Correct Patient, Correct Site, Correct Laterality, Correct Procedure, Correct Position, site marked, Risks and benefits discussed,  Surgical consent,  Pre-op evaluation,  At surgeon's request and post-op pain management  Laterality: Left  Prep: chloraprep       Needles:  Injection technique: Single-shot  Needle Type: Echogenic Needle     Needle Length: 9cm  Needle Gauge: 21     Additional Needles:   Procedures:,,,, ultrasound used (permanent image in chart),,,,  Narrative:  Start time: 03/29/2017 2:18 PM End time: 03/29/2017 2:23 PM Injection made incrementally with aspirations every 5 mL.  Performed by: Personally  Anesthesiologist: Catalina Gravel, MD  Additional Notes: No pain on injection. No increased resistance to injection. Injection made in 5cc increments.  Good needle visualization.  Patient tolerated procedure well.

## 2017-03-29 NOTE — Op Note (Signed)
03/29/2017 [facility]  Operative Note  Pre Op Diagnosis: Left comminuted intraarticular distal radius fracture  Post Op Diagnosis: Left comminuted intraarticular distal radius fracture  Procedure: ORIF Left comminuted intraarticular distal radius fracture, 4 intraarticular fragments  Surgeon: Leanora Cover, MD  Assistant: Daryll Brod, MD  Anesthesia: General and Regional  Fluids: Per anesthesia flow sheet  EBL: minimal  Complications: None  Specimen: None  Tourniquet Time:  Total Tourniquet Time Documented: Upper Arm (Left) - 57 minutes Total: Upper Arm (Left) - 57 minutes   Disposition: Stable to PACU  INDICATIONS:  Cristina Wilson is a 67 y.o. female states she fell 03/17/17 injuring left wrist.  Seen by Dr. Griffin Basil and XR and CT show comminuted intraarticular distal radius fracture. Splinted and followed up in office.  We discussed nonoperative and operative treatment options.  She wished to proceed with operative fixation.  Risks, benefits, and alternatives of surgery were discussed including the risk of blood loss; infection; damage to nerves, vessels, tendons, ligaments, bone; failure of surgery; need for additional surgery; complications with wound healing; continued pain; nonunion; malunion; stiffness.  We also discussed the possible need for bone graft and the benefits and risks including the possibility of disease transmission.  She voiced understanding of these risks and elected to proceed.   OPERATIVE COURSE:  After being identified preoperatively by myself, the patient and I agreed upon the procedure and site of procedure.  Surgical site was marked.  The risks, benefits and alternatives of the surgery were reviewed and she wished to proceed.  Surgical consent had been signed.  She was given IV Ancef as preoperative antibiotic prophylaxis.  She was transferred to the operating room and placed on the operating room table in supine position with the Left  upper extremity on an armboard. Regional anesthesia was induced by the anesthesiologist.  General anesthesia was then induced due to incomplete regional block.  The Left upper extremity was prepped and draped in normal sterile orthopedic fashion.  A surgical pause was performed between the surgeons, anesthesia and operating room staff, and all were in agreement as to the patient, procedure and site of procedure.  Tourniquet at the proximal aspect of the extremity was inflated to 250 mmHg after exsanguination of the limb with an Esmarch bandage.  Standard volar Mallie Mussel approach was used.  The bipolar electrocautery was used to obtain hemostasis.  The superficial and deep portions of the FCR tendon sheath were incised, and the FCR and FPL were swept ulnarly to protect the palmar cutaneous branch of the median nerve.  The brachioradialis was released at the radial side of the radius.  The pronator quadratus was released and elevated with the periosteal elevator.  The fracture site was identified and cleared of soft tissue interposition and hematoma.  It was reduced under direct visualization.  The central column was intact.  The fracture lines of the radial and ulnar fragments were opened.  The c arm was used to aid in reduction.  There was compaction of the cancellous bone.  Cancellous bone chips were then packed into the void.  An AcuMed volar distal radial locking plate was selected.  It was secured to the bone with the guidepins.  C-arm was used in AP and lateral projections to ensure appropriate reduction and position of the hardware and adjustments made as necessary.  Standard AO drilling and measuring technique was used.  A single screw was placed in the slotted hole in the shaft of the plate.  The  distal holes were filled with locking pegs with the exception of the styloid holes, which were filled with locking screws.  The remaining holes in the shaft of the plate were filled with nonlocking screws.  Good  purchase was obtained.  C-arm was used in AP, lateral and oblique projections to ensure appropriate reduction and position of hardware, which was the case.  There was no intra-articular penetration of hardware.  The wound was copiously irrigated with sterile saline.  Pronator quadratus was repaired back over top of the plate using 4-0 Vicryl suture.  Vicryl suture was placed in the subcutaneous tissues in an inverted interrupted fashion and the skin was closed with 4-0 nylon in a horizontal mattress fashion.  There was good pronation and supination of the wrist without crepitance.  The wound was then dressed with sterile Xeroform, 4x4s, and wrapped with a Kerlix bandage.  A volar splint was placed and wrapped with Kerlix and Ace bandage.  Tourniquet was deflated at 56 minutes.  Fingertips were pink with brisk capillary refill after deflation of the tourniquet.  Operative drapes were broken down.  The patient was awoken from anesthesia safely.  She was transferred back to the stretcher and taken to the PACU in stable condition.  I will see her back in the office in one week for postoperative followup.  I will give her a prescription for percocet 5/325 1-2 tabs PO q6 hours prn pain, dispense #30.    Tennis Must, MD Electronically signed, 03/29/17

## 2017-03-29 NOTE — Discharge Instructions (Addendum)

## 2017-03-30 ENCOUNTER — Encounter (HOSPITAL_BASED_OUTPATIENT_CLINIC_OR_DEPARTMENT_OTHER): Payer: Self-pay | Admitting: Orthopedic Surgery

## 2017-03-30 NOTE — Anesthesia Postprocedure Evaluation (Signed)
Anesthesia Post Note  Patient: Taleisha Kaczynski  Procedure(s) Performed: OPEN REDUCTION INTERNAL FIXATION (ORIF)LEFT  DISTAL RADIAL FRACTURE (Left Wrist)     Patient location during evaluation: PACU Anesthesia Type: Regional and General Level of consciousness: awake and alert Pain management: pain level controlled Vital Signs Assessment: post-procedure vital signs reviewed and stable Respiratory status: spontaneous breathing, nonlabored ventilation and respiratory function stable Cardiovascular status: blood pressure returned to baseline and stable Postop Assessment: no apparent nausea or vomiting Anesthetic complications: no    Last Vitals:  Vitals:   03/29/17 1710 03/29/17 1712  BP:  (!) 147/87  Pulse:  71  Resp:  17  Temp:  36.8 C  SpO2: 96% 96%    Last Pain:  Vitals:   03/29/17 1712  TempSrc:   PainSc: 0-No pain                 Deardra Hinkley,W. EDMOND

## 2017-04-05 DIAGNOSIS — S52572A Other intraarticular fracture of lower end of left radius, initial encounter for closed fracture: Secondary | ICD-10-CM | POA: Diagnosis not present

## 2017-04-05 DIAGNOSIS — M25432 Effusion, left wrist: Secondary | ICD-10-CM | POA: Diagnosis not present

## 2017-04-05 DIAGNOSIS — M25632 Stiffness of left wrist, not elsewhere classified: Secondary | ICD-10-CM | POA: Diagnosis not present

## 2017-04-05 DIAGNOSIS — M25532 Pain in left wrist: Secondary | ICD-10-CM | POA: Diagnosis not present

## 2017-04-07 ENCOUNTER — Ambulatory Visit
Admission: RE | Admit: 2017-04-07 | Discharge: 2017-04-07 | Disposition: A | Discharge status: Home / Self Care | Payer: BC Managed Care – PPO | Source: Ambulatory Visit | Attending: Orthopedic Surgery | Admitting: Orthopedic Surgery

## 2017-04-07 ENCOUNTER — Other Ambulatory Visit: Payer: Self-pay | Admitting: Orthopedic Surgery

## 2017-04-07 DIAGNOSIS — M48061 Spinal stenosis, lumbar region without neurogenic claudication: Secondary | ICD-10-CM

## 2017-04-07 MED ORDER — IOPAMIDOL (ISOVUE-M 200) INJECTION 41%
5.5000 mL | Freq: Once | INTRAMUSCULAR | Status: AC
  Administered 2017-04-07: 5.5 mL

## 2017-04-07 MED ORDER — CEFAZOLIN SODIUM-DEXTROSE 2-4 GM/100ML-% IV SOLN
2.0000 g | INTRAVENOUS | Status: AC
  Administered 2017-04-07: 2 g via INTRAVENOUS

## 2017-04-07 MED ORDER — SODIUM CHLORIDE 0.9 % IV SOLN
Freq: Once | INTRAVENOUS | Status: AC
  Administered 2017-04-07: 08:00:00 via INTRAVENOUS

## 2017-04-07 MED ORDER — MIDAZOLAM HCL 2 MG/2ML IJ SOLN: 1.0000 mg | INTRAMUSCULAR | Status: DC | PRN

## 2017-04-07 MED ORDER — ONDANSETRON HCL 4 MG/2ML IJ SOLN: 4.0000 mg | Freq: Four times a day (QID) | INTRAMUSCULAR | Status: DC | PRN

## 2017-04-07 MED ORDER — KETOROLAC TROMETHAMINE 30 MG/ML IJ SOLN: 30.0000 mg | Freq: Once | INTRAMUSCULAR | Status: DC

## 2017-04-07 MED ORDER — FENTANYL CITRATE (PF) 100 MCG/2ML IJ SOLN: 25.0000 ug | INTRAMUSCULAR | Status: DC | PRN

## 2017-04-07 NOTE — Progress Notes (Signed)
Pt tolerated procedure well. Pt did not require sedation, only 50mg  of fentanyl for pain control. Pt currently awake, alert and speaking in complete sentences. Pt reports pain is better at this time.

## 2017-04-07 NOTE — Discharge Instructions (Signed)
Discogram Post Procedure Discharge Instructions ° °1. May resume a regular diet and any medications that you routinely take (including pain medications). °2. No driving day of procedure. °3. Upon discharge go home and rest for at least 4 hours.  May use an ice pack as needed to injection sites on back.  Ice to back 30 minutes on and 30 minutes off, all day. °4. May remove bandades later, today. °5. It is not unusual to be sore for several days after this procedure. ° ° ° °Please contact our office at [phone] for the following symptoms: ° °· Fever greater than 100 degrees °· Increased swelling, pain, or redness at injection site. ° ° °Thank you for visiting [facility]. ° ° °

## 2017-04-08 DIAGNOSIS — M545 Low back pain: Secondary | ICD-10-CM | POA: Diagnosis not present

## 2017-04-12 DIAGNOSIS — G8929 Other chronic pain: Secondary | ICD-10-CM | POA: Insufficient documentation

## 2017-04-12 DIAGNOSIS — G894 Chronic pain syndrome: Secondary | ICD-10-CM | POA: Insufficient documentation

## 2017-04-12 DIAGNOSIS — M545 Low back pain: Secondary | ICD-10-CM | POA: Diagnosis not present

## 2017-04-12 DIAGNOSIS — M5136 Other intervertebral disc degeneration, lumbar region: Secondary | ICD-10-CM | POA: Diagnosis not present

## 2017-04-12 HISTORY — DX: Other chronic pain: G89.29

## 2017-04-12 HISTORY — DX: Chronic pain syndrome: G89.4

## 2017-04-15 DIAGNOSIS — S52124D Nondisplaced fracture of head of right radius, subsequent encounter for closed fracture with routine healing: Secondary | ICD-10-CM | POA: Diagnosis not present

## 2017-05-11 DIAGNOSIS — S52572D Other intraarticular fracture of lower end of left radius, subsequent encounter for closed fracture with routine healing: Secondary | ICD-10-CM | POA: Diagnosis not present

## 2017-05-13 DIAGNOSIS — S52124D Nondisplaced fracture of head of right radius, subsequent encounter for closed fracture with routine healing: Secondary | ICD-10-CM | POA: Diagnosis not present

## 2017-05-17 DIAGNOSIS — M7071 Other bursitis of hip, right hip: Secondary | ICD-10-CM | POA: Diagnosis not present

## 2017-06-09 DIAGNOSIS — G894 Chronic pain syndrome: Secondary | ICD-10-CM | POA: Diagnosis not present

## 2017-06-09 DIAGNOSIS — M7071 Other bursitis of hip, right hip: Secondary | ICD-10-CM | POA: Diagnosis not present

## 2017-06-21 DIAGNOSIS — S52124D Nondisplaced fracture of head of right radius, subsequent encounter for closed fracture with routine healing: Secondary | ICD-10-CM | POA: Diagnosis not present

## 2017-06-24 DIAGNOSIS — S52572D Other intraarticular fracture of lower end of left radius, subsequent encounter for closed fracture with routine healing: Secondary | ICD-10-CM | POA: Diagnosis not present

## 2017-07-12 DIAGNOSIS — M7071 Other bursitis of hip, right hip: Secondary | ICD-10-CM | POA: Diagnosis not present

## 2017-07-12 DIAGNOSIS — M7061 Trochanteric bursitis, right hip: Secondary | ICD-10-CM | POA: Diagnosis not present

## 2017-07-12 DIAGNOSIS — S5001XA Contusion of right elbow, initial encounter: Secondary | ICD-10-CM | POA: Diagnosis not present

## 2017-07-13 DIAGNOSIS — H5711 Ocular pain, right eye: Secondary | ICD-10-CM | POA: Diagnosis not present

## 2017-07-13 DIAGNOSIS — S0181XA Laceration without foreign body of other part of head, initial encounter: Secondary | ICD-10-CM | POA: Diagnosis not present

## 2017-07-20 DIAGNOSIS — M79604 Pain in right leg: Secondary | ICD-10-CM | POA: Diagnosis not present

## 2017-07-20 DIAGNOSIS — R202 Paresthesia of skin: Secondary | ICD-10-CM | POA: Diagnosis not present

## 2017-07-22 DIAGNOSIS — M79604 Pain in right leg: Secondary | ICD-10-CM | POA: Diagnosis not present

## 2017-07-22 DIAGNOSIS — R202 Paresthesia of skin: Secondary | ICD-10-CM | POA: Diagnosis not present

## 2017-07-25 DIAGNOSIS — R202 Paresthesia of skin: Secondary | ICD-10-CM | POA: Diagnosis not present

## 2017-07-25 DIAGNOSIS — M79604 Pain in right leg: Secondary | ICD-10-CM | POA: Diagnosis not present

## 2017-07-27 DIAGNOSIS — R202 Paresthesia of skin: Secondary | ICD-10-CM | POA: Diagnosis not present

## 2017-07-27 DIAGNOSIS — M79604 Pain in right leg: Secondary | ICD-10-CM | POA: Diagnosis not present

## 2017-07-29 DIAGNOSIS — M79604 Pain in right leg: Secondary | ICD-10-CM | POA: Diagnosis not present

## 2017-07-29 DIAGNOSIS — R202 Paresthesia of skin: Secondary | ICD-10-CM | POA: Diagnosis not present

## 2017-08-01 DIAGNOSIS — M79604 Pain in right leg: Secondary | ICD-10-CM | POA: Diagnosis not present

## 2017-08-01 DIAGNOSIS — R202 Paresthesia of skin: Secondary | ICD-10-CM | POA: Diagnosis not present

## 2017-08-03 DIAGNOSIS — M79604 Pain in right leg: Secondary | ICD-10-CM | POA: Diagnosis not present

## 2017-08-03 DIAGNOSIS — R202 Paresthesia of skin: Secondary | ICD-10-CM | POA: Diagnosis not present

## 2017-08-05 DIAGNOSIS — M79604 Pain in right leg: Secondary | ICD-10-CM | POA: Diagnosis not present

## 2017-08-05 DIAGNOSIS — R202 Paresthesia of skin: Secondary | ICD-10-CM | POA: Diagnosis not present

## 2017-08-08 DIAGNOSIS — M79604 Pain in right leg: Secondary | ICD-10-CM | POA: Diagnosis not present

## 2017-08-08 DIAGNOSIS — R202 Paresthesia of skin: Secondary | ICD-10-CM | POA: Diagnosis not present

## 2017-08-10 DIAGNOSIS — M79604 Pain in right leg: Secondary | ICD-10-CM | POA: Diagnosis not present

## 2017-08-10 DIAGNOSIS — R202 Paresthesia of skin: Secondary | ICD-10-CM | POA: Diagnosis not present

## 2017-08-12 DIAGNOSIS — H2513 Age-related nuclear cataract, bilateral: Secondary | ICD-10-CM | POA: Diagnosis not present

## 2017-08-12 DIAGNOSIS — M79604 Pain in right leg: Secondary | ICD-10-CM | POA: Diagnosis not present

## 2017-08-12 DIAGNOSIS — R202 Paresthesia of skin: Secondary | ICD-10-CM | POA: Diagnosis not present

## 2017-08-12 DIAGNOSIS — G510 Bell's palsy: Secondary | ICD-10-CM | POA: Diagnosis not present

## 2017-08-12 DIAGNOSIS — H16223 Keratoconjunctivitis sicca, not specified as Sjogren's, bilateral: Secondary | ICD-10-CM | POA: Diagnosis not present

## 2017-08-12 DIAGNOSIS — D3131 Benign neoplasm of right choroid: Secondary | ICD-10-CM | POA: Diagnosis not present

## 2017-08-13 ENCOUNTER — Other Ambulatory Visit: Payer: Self-pay | Admitting: Neurosurgery

## 2017-08-13 DIAGNOSIS — I671 Cerebral aneurysm, nonruptured: Secondary | ICD-10-CM

## 2017-08-17 ENCOUNTER — Ambulatory Visit
Admission: RE | Admit: 2017-08-17 | Discharge: 2017-08-17 | Disposition: A | Discharge status: Home / Self Care | Payer: BC Managed Care – PPO | Source: Ambulatory Visit | Attending: Neurosurgery | Admitting: Neurosurgery

## 2017-08-17 DIAGNOSIS — I671 Cerebral aneurysm, nonruptured: Secondary | ICD-10-CM

## 2017-08-17 DIAGNOSIS — I720 Aneurysm of carotid artery: Secondary | ICD-10-CM | POA: Diagnosis not present

## 2017-08-22 DIAGNOSIS — I10 Essential (primary) hypertension: Secondary | ICD-10-CM | POA: Diagnosis not present

## 2017-08-22 DIAGNOSIS — Z6827 Body mass index (BMI) 27.0-27.9, adult: Secondary | ICD-10-CM | POA: Diagnosis not present

## 2017-08-22 DIAGNOSIS — I671 Cerebral aneurysm, nonruptured: Secondary | ICD-10-CM | POA: Diagnosis not present

## 2017-10-07 ENCOUNTER — Other Ambulatory Visit: Payer: Self-pay | Admitting: Geriatric Medicine

## 2017-10-07 DIAGNOSIS — R911 Solitary pulmonary nodule: Secondary | ICD-10-CM

## 2017-10-12 ENCOUNTER — Other Ambulatory Visit: Payer: BC Managed Care – PPO

## 2017-11-03 DIAGNOSIS — Z23 Encounter for immunization: Secondary | ICD-10-CM | POA: Diagnosis not present

## 2017-11-07 ENCOUNTER — Ambulatory Visit
Admission: RE | Admit: 2017-11-07 | Discharge: 2017-11-07 | Disposition: A | Discharge status: Home / Self Care | Payer: Medicare HMO | Source: Ambulatory Visit | Attending: Geriatric Medicine | Admitting: Geriatric Medicine

## 2017-11-07 DIAGNOSIS — R918 Other nonspecific abnormal finding of lung field: Secondary | ICD-10-CM | POA: Diagnosis not present

## 2017-11-07 DIAGNOSIS — R911 Solitary pulmonary nodule: Secondary | ICD-10-CM

## 2017-11-11 ENCOUNTER — Other Ambulatory Visit: Payer: Self-pay | Admitting: Geriatric Medicine

## 2017-11-11 DIAGNOSIS — J984 Other disorders of lung: Secondary | ICD-10-CM

## 2018-01-20 DIAGNOSIS — Z1231 Encounter for screening mammogram for malignant neoplasm of breast: Secondary | ICD-10-CM | POA: Diagnosis not present

## 2018-02-08 ENCOUNTER — Other Ambulatory Visit: Payer: Medicare HMO

## 2018-02-10 DIAGNOSIS — S52572D Other intraarticular fracture of lower end of left radius, subsequent encounter for closed fracture with routine healing: Secondary | ICD-10-CM | POA: Diagnosis not present

## 2018-02-10 DIAGNOSIS — M65312 Trigger thumb, left thumb: Secondary | ICD-10-CM

## 2018-02-10 DIAGNOSIS — M25532 Pain in left wrist: Secondary | ICD-10-CM | POA: Diagnosis not present

## 2018-02-10 DIAGNOSIS — M654 Radial styloid tenosynovitis [de Quervain]: Secondary | ICD-10-CM | POA: Diagnosis not present

## 2018-02-10 HISTORY — DX: Trigger thumb, left thumb: M65.312

## 2018-02-10 HISTORY — DX: Radial styloid tenosynovitis (de quervain): M65.4

## 2018-02-16 DIAGNOSIS — Z23 Encounter for immunization: Secondary | ICD-10-CM | POA: Diagnosis not present

## 2018-02-16 DIAGNOSIS — L710 Perioral dermatitis: Secondary | ICD-10-CM | POA: Diagnosis not present

## 2018-03-10 DIAGNOSIS — Z6828 Body mass index (BMI) 28.0-28.9, adult: Secondary | ICD-10-CM | POA: Diagnosis not present

## 2018-03-10 DIAGNOSIS — M533 Sacrococcygeal disorders, not elsewhere classified: Secondary | ICD-10-CM | POA: Diagnosis not present

## 2018-03-15 ENCOUNTER — Encounter (HOSPITAL_BASED_OUTPATIENT_CLINIC_OR_DEPARTMENT_OTHER): Payer: Self-pay

## 2018-03-15 ENCOUNTER — Other Ambulatory Visit: Payer: Self-pay | Admitting: Orthopedic Surgery

## 2018-03-15 ENCOUNTER — Other Ambulatory Visit: Payer: Self-pay

## 2018-03-15 DIAGNOSIS — M65312 Trigger thumb, left thumb: Secondary | ICD-10-CM | POA: Diagnosis not present

## 2018-03-16 NOTE — Progress Notes (Signed)
Chart reviewed with Dr Christella Hartigan. Continue with surgery as scheduled as long as pt's blood pressure is well controlled on DOS.

## 2018-03-17 DIAGNOSIS — M8589 Other specified disorders of bone density and structure, multiple sites: Secondary | ICD-10-CM | POA: Diagnosis not present

## 2018-03-23 ENCOUNTER — Ambulatory Visit (HOSPITAL_BASED_OUTPATIENT_CLINIC_OR_DEPARTMENT_OTHER): Admission: RE | Admit: 2018-03-23 | Payer: Medicare HMO | Source: Home / Self Care | Admitting: Orthopedic Surgery

## 2018-03-23 SURGERY — RELEASE, A1 PULLEY, FOR TRIGGER FINGER
Anesthesia: Choice | Laterality: Left

## 2018-03-30 DIAGNOSIS — I10 Essential (primary) hypertension: Secondary | ICD-10-CM | POA: Diagnosis not present

## 2018-03-30 DIAGNOSIS — J301 Allergic rhinitis due to pollen: Secondary | ICD-10-CM | POA: Diagnosis not present

## 2018-03-30 DIAGNOSIS — M810 Age-related osteoporosis without current pathological fracture: Secondary | ICD-10-CM | POA: Diagnosis not present

## 2018-04-13 ENCOUNTER — Ambulatory Visit: Payer: BC Managed Care – PPO | Admitting: Podiatry

## 2018-04-17 ENCOUNTER — Ambulatory Visit (INDEPENDENT_AMBULATORY_CARE_PROVIDER_SITE_OTHER): Payer: Medicare HMO | Admitting: Podiatry

## 2018-04-17 ENCOUNTER — Encounter: Payer: Self-pay | Admitting: Podiatry

## 2018-04-17 ENCOUNTER — Other Ambulatory Visit: Payer: Self-pay

## 2018-04-17 DIAGNOSIS — E785 Hyperlipidemia, unspecified: Secondary | ICD-10-CM

## 2018-04-17 DIAGNOSIS — E079 Disorder of thyroid, unspecified: Secondary | ICD-10-CM | POA: Insufficient documentation

## 2018-04-17 DIAGNOSIS — L600 Ingrowing nail: Secondary | ICD-10-CM

## 2018-04-17 DIAGNOSIS — I10 Essential (primary) hypertension: Secondary | ICD-10-CM | POA: Insufficient documentation

## 2018-04-17 HISTORY — DX: Hyperlipidemia, unspecified: E78.5

## 2018-04-17 HISTORY — DX: Disorder of thyroid, unspecified: E07.9

## 2018-04-17 NOTE — Patient Instructions (Signed)

## 2018-04-17 NOTE — Progress Notes (Signed)
Subjective:   Patient ID: Cristina Wilson, female   DOB: 68 y.o.   MRN: [id number]   HPI Patient presents with pain fill ingrown toenail deformity left that she is tried to trim and soak herself without relief.  Patient states that it is gotten worse over the last couple months and patient does not smoke and likes to be active   Review of Systems  All other systems reviewed and are negative.       Objective:  Physical Exam Vitals signs and nursing note reviewed.  Constitutional:      Appearance: She is well-developed.  Pulmonary:     Effort: Pulmonary effort is normal.  Musculoskeletal: Normal range of motion.  Skin:    General: Skin is warm.  Neurological:     Mental Status: She is alert.     Neurovascular status intact muscle strength is adequate range of motion within normal limits with patient found to have incurvated left hallux medial border that is painful when pressed with localized irritation of the tissue no active drainage or redness noted.  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  I explained procedure risk and patient wants surgery and today I infiltrated the left hallux 60 mg like Marcaine mixture sterile prep applied and using sterile instrumentation I remove the medial border and applied phenol 3 applications 30 seconds to matrix followed by alcohol prep and then applied sterile dressing with instructions to leave on 24 hours but to take it off earlier if it should start throbbing and wrote prescription for drops and encouraged to call with questions

## 2018-05-10 DIAGNOSIS — M533 Sacrococcygeal disorders, not elsewhere classified: Secondary | ICD-10-CM | POA: Diagnosis not present

## 2018-05-12 ENCOUNTER — Other Ambulatory Visit: Payer: Self-pay

## 2018-05-12 ENCOUNTER — Ambulatory Visit
Admission: RE | Admit: 2018-05-12 | Discharge: 2018-05-12 | Disposition: A | Discharge status: Home / Self Care | Payer: Medicare HMO | Source: Ambulatory Visit | Attending: Geriatric Medicine | Admitting: Geriatric Medicine

## 2018-05-12 DIAGNOSIS — J984 Other disorders of lung: Secondary | ICD-10-CM

## 2018-05-15 DIAGNOSIS — R911 Solitary pulmonary nodule: Secondary | ICD-10-CM | POA: Diagnosis not present

## 2018-05-15 DIAGNOSIS — I10 Essential (primary) hypertension: Secondary | ICD-10-CM | POA: Diagnosis not present

## 2018-05-15 DIAGNOSIS — M810 Age-related osteoporosis without current pathological fracture: Secondary | ICD-10-CM | POA: Diagnosis not present

## 2018-05-15 DIAGNOSIS — E039 Hypothyroidism, unspecified: Secondary | ICD-10-CM | POA: Diagnosis not present

## 2018-05-17 ENCOUNTER — Other Ambulatory Visit: Payer: Self-pay | Admitting: Orthopedic Surgery

## 2018-06-03 DIAGNOSIS — Z20828 Contact with and (suspected) exposure to other viral communicable diseases: Secondary | ICD-10-CM | POA: Diagnosis not present

## 2018-06-03 DIAGNOSIS — E039 Hypothyroidism, unspecified: Secondary | ICD-10-CM | POA: Diagnosis not present

## 2018-06-03 DIAGNOSIS — Z79899 Other long term (current) drug therapy: Secondary | ICD-10-CM | POA: Diagnosis not present

## 2018-06-03 DIAGNOSIS — E785 Hyperlipidemia, unspecified: Secondary | ICD-10-CM | POA: Diagnosis not present

## 2018-06-03 DIAGNOSIS — I10 Essential (primary) hypertension: Secondary | ICD-10-CM | POA: Diagnosis not present

## 2018-06-03 DIAGNOSIS — R070 Pain in throat: Secondary | ICD-10-CM | POA: Diagnosis not present

## 2018-06-03 DIAGNOSIS — R05 Cough: Secondary | ICD-10-CM | POA: Diagnosis not present

## 2018-06-03 DIAGNOSIS — R509 Fever, unspecified: Secondary | ICD-10-CM | POA: Diagnosis not present

## 2018-06-03 DIAGNOSIS — Z87891 Personal history of nicotine dependence: Secondary | ICD-10-CM | POA: Diagnosis not present

## 2018-06-03 DIAGNOSIS — Z882 Allergy status to sulfonamides status: Secondary | ICD-10-CM | POA: Diagnosis not present

## 2018-06-06 DIAGNOSIS — R109 Unspecified abdominal pain: Secondary | ICD-10-CM | POA: Insufficient documentation

## 2018-06-06 DIAGNOSIS — N136 Pyonephrosis: Secondary | ICD-10-CM | POA: Diagnosis not present

## 2018-06-06 DIAGNOSIS — R110 Nausea: Secondary | ICD-10-CM | POA: Diagnosis not present

## 2018-06-06 DIAGNOSIS — R069 Unspecified abnormalities of breathing: Secondary | ICD-10-CM | POA: Diagnosis not present

## 2018-06-06 DIAGNOSIS — N132 Hydronephrosis with renal and ureteral calculous obstruction: Secondary | ICD-10-CM | POA: Insufficient documentation

## 2018-06-06 DIAGNOSIS — R531 Weakness: Secondary | ICD-10-CM | POA: Diagnosis not present

## 2018-06-06 DIAGNOSIS — R1084 Generalized abdominal pain: Secondary | ICD-10-CM | POA: Diagnosis not present

## 2018-06-06 DIAGNOSIS — D72829 Elevated white blood cell count, unspecified: Secondary | ICD-10-CM | POA: Diagnosis not present

## 2018-06-06 DIAGNOSIS — D649 Anemia, unspecified: Secondary | ICD-10-CM | POA: Diagnosis not present

## 2018-06-06 DIAGNOSIS — M310 Hypersensitivity angiitis: Secondary | ICD-10-CM | POA: Diagnosis not present

## 2018-06-06 DIAGNOSIS — R1310 Dysphagia, unspecified: Secondary | ICD-10-CM | POA: Diagnosis not present

## 2018-06-06 DIAGNOSIS — E876 Hypokalemia: Secondary | ICD-10-CM | POA: Diagnosis not present

## 2018-06-06 DIAGNOSIS — N170 Acute kidney failure with tubular necrosis: Secondary | ICD-10-CM | POA: Diagnosis not present

## 2018-06-06 DIAGNOSIS — R918 Other nonspecific abnormal finding of lung field: Secondary | ICD-10-CM | POA: Diagnosis not present

## 2018-06-06 DIAGNOSIS — N131 Hydronephrosis with ureteral stricture, not elsewhere classified: Secondary | ICD-10-CM | POA: Insufficient documentation

## 2018-06-06 DIAGNOSIS — I509 Heart failure, unspecified: Secondary | ICD-10-CM | POA: Diagnosis not present

## 2018-06-06 DIAGNOSIS — R34 Anuria and oliguria: Secondary | ICD-10-CM | POA: Diagnosis not present

## 2018-06-06 DIAGNOSIS — E871 Hypo-osmolality and hyponatremia: Secondary | ICD-10-CM | POA: Diagnosis not present

## 2018-06-06 DIAGNOSIS — R69 Illness, unspecified: Secondary | ICD-10-CM | POA: Diagnosis not present

## 2018-06-06 DIAGNOSIS — K208 Other esophagitis: Secondary | ICD-10-CM | POA: Diagnosis not present

## 2018-06-06 DIAGNOSIS — K279 Peptic ulcer, site unspecified, unspecified as acute or chronic, without hemorrhage or perforation: Secondary | ICD-10-CM | POA: Diagnosis not present

## 2018-06-06 DIAGNOSIS — R601 Generalized edema: Secondary | ICD-10-CM | POA: Diagnosis not present

## 2018-06-06 DIAGNOSIS — J90 Pleural effusion, not elsewhere classified: Secondary | ICD-10-CM | POA: Diagnosis not present

## 2018-06-06 DIAGNOSIS — N3090 Cystitis, unspecified without hematuria: Secondary | ICD-10-CM | POA: Diagnosis not present

## 2018-06-06 DIAGNOSIS — N200 Calculus of kidney: Secondary | ICD-10-CM | POA: Diagnosis not present

## 2018-06-06 DIAGNOSIS — E039 Hypothyroidism, unspecified: Secondary | ICD-10-CM | POA: Diagnosis not present

## 2018-06-06 DIAGNOSIS — E872 Acidosis: Secondary | ICD-10-CM | POA: Diagnosis not present

## 2018-06-06 DIAGNOSIS — I959 Hypotension, unspecified: Secondary | ICD-10-CM | POA: Diagnosis not present

## 2018-06-06 DIAGNOSIS — N201 Calculus of ureter: Secondary | ICD-10-CM | POA: Diagnosis not present

## 2018-06-06 DIAGNOSIS — R9341 Abnormal radiologic findings on diagnostic imaging of renal pelvis, ureter, or bladder: Secondary | ICD-10-CM | POA: Diagnosis not present

## 2018-06-06 DIAGNOSIS — I10 Essential (primary) hypertension: Secondary | ICD-10-CM | POA: Diagnosis not present

## 2018-06-06 DIAGNOSIS — Z48816 Encounter for surgical aftercare following surgery on the genitourinary system: Secondary | ICD-10-CM | POA: Diagnosis not present

## 2018-06-06 DIAGNOSIS — K259 Gastric ulcer, unspecified as acute or chronic, without hemorrhage or perforation: Secondary | ICD-10-CM | POA: Diagnosis not present

## 2018-06-06 DIAGNOSIS — R112 Nausea with vomiting, unspecified: Secondary | ICD-10-CM | POA: Diagnosis not present

## 2018-06-06 DIAGNOSIS — Z452 Encounter for adjustment and management of vascular access device: Secondary | ICD-10-CM | POA: Diagnosis not present

## 2018-06-06 DIAGNOSIS — I213 ST elevation (STEMI) myocardial infarction of unspecified site: Secondary | ICD-10-CM | POA: Diagnosis not present

## 2018-06-06 DIAGNOSIS — K7689 Other specified diseases of liver: Secondary | ICD-10-CM | POA: Diagnosis not present

## 2018-06-06 DIAGNOSIS — R140 Abdominal distension (gaseous): Secondary | ICD-10-CM | POA: Diagnosis not present

## 2018-06-06 DIAGNOSIS — N057 Unspecified nephritic syndrome with diffuse crescentic glomerulonephritis: Secondary | ICD-10-CM | POA: Diagnosis not present

## 2018-06-06 DIAGNOSIS — N130 Hydronephrosis with ureteropelvic junction obstruction: Secondary | ICD-10-CM | POA: Diagnosis not present

## 2018-06-06 DIAGNOSIS — R52 Pain, unspecified: Secondary | ICD-10-CM | POA: Diagnosis not present

## 2018-06-06 DIAGNOSIS — R0689 Other abnormalities of breathing: Secondary | ICD-10-CM | POA: Diagnosis not present

## 2018-06-06 DIAGNOSIS — E860 Dehydration: Secondary | ICD-10-CM | POA: Diagnosis not present

## 2018-06-06 DIAGNOSIS — R0902 Hypoxemia: Secondary | ICD-10-CM | POA: Diagnosis not present

## 2018-06-06 DIAGNOSIS — I120 Hypertensive chronic kidney disease with stage 5 chronic kidney disease or end stage renal disease: Secondary | ICD-10-CM | POA: Diagnosis not present

## 2018-06-06 DIAGNOSIS — M6250 Muscle wasting and atrophy, not elsewhere classified, unspecified site: Secondary | ICD-10-CM | POA: Diagnosis not present

## 2018-06-06 DIAGNOSIS — K5790 Diverticulosis of intestine, part unspecified, without perforation or abscess without bleeding: Secondary | ICD-10-CM | POA: Diagnosis not present

## 2018-06-06 DIAGNOSIS — N390 Urinary tract infection, site not specified: Secondary | ICD-10-CM | POA: Diagnosis not present

## 2018-06-06 DIAGNOSIS — R609 Edema, unspecified: Secondary | ICD-10-CM | POA: Diagnosis not present

## 2018-06-06 DIAGNOSIS — E875 Hyperkalemia: Secondary | ICD-10-CM | POA: Diagnosis not present

## 2018-06-06 DIAGNOSIS — N1330 Unspecified hydronephrosis: Secondary | ICD-10-CM | POA: Diagnosis not present

## 2018-06-06 DIAGNOSIS — K219 Gastro-esophageal reflux disease without esophagitis: Secondary | ICD-10-CM | POA: Diagnosis not present

## 2018-06-06 DIAGNOSIS — N179 Acute kidney failure, unspecified: Secondary | ICD-10-CM | POA: Diagnosis not present

## 2018-06-06 HISTORY — DX: Unspecified abdominal pain: R10.9

## 2018-06-22 ENCOUNTER — Ambulatory Visit (HOSPITAL_BASED_OUTPATIENT_CLINIC_OR_DEPARTMENT_OTHER): Admit: 2018-06-22 | Payer: Medicare HMO | Admitting: Orthopedic Surgery

## 2018-06-22 ENCOUNTER — Encounter (HOSPITAL_BASED_OUTPATIENT_CLINIC_OR_DEPARTMENT_OTHER): Payer: Self-pay

## 2018-06-22 DIAGNOSIS — M310 Hypersensitivity angiitis: Secondary | ICD-10-CM | POA: Diagnosis not present

## 2018-06-22 DIAGNOSIS — N179 Acute kidney failure, unspecified: Secondary | ICD-10-CM | POA: Insufficient documentation

## 2018-06-22 DIAGNOSIS — I10 Essential (primary) hypertension: Secondary | ICD-10-CM | POA: Diagnosis not present

## 2018-06-22 DIAGNOSIS — R69 Illness, unspecified: Secondary | ICD-10-CM | POA: Diagnosis not present

## 2018-06-22 DIAGNOSIS — Z1159 Encounter for screening for other viral diseases: Secondary | ICD-10-CM | POA: Diagnosis not present

## 2018-06-22 DIAGNOSIS — Z87891 Personal history of nicotine dependence: Secondary | ICD-10-CM | POA: Diagnosis not present

## 2018-06-22 DIAGNOSIS — N200 Calculus of kidney: Secondary | ICD-10-CM | POA: Diagnosis not present

## 2018-06-22 DIAGNOSIS — Z6835 Body mass index (BMI) 35.0-35.9, adult: Secondary | ICD-10-CM | POA: Diagnosis not present

## 2018-06-22 DIAGNOSIS — E0590 Thyrotoxicosis, unspecified without thyrotoxic crisis or storm: Secondary | ICD-10-CM | POA: Diagnosis not present

## 2018-06-22 DIAGNOSIS — D75A Glucose-6-phosphate dehydrogenase (G6PD) deficiency without anemia: Secondary | ICD-10-CM | POA: Diagnosis not present

## 2018-06-22 DIAGNOSIS — E039 Hypothyroidism, unspecified: Secondary | ICD-10-CM | POA: Diagnosis not present

## 2018-06-22 DIAGNOSIS — D649 Anemia, unspecified: Secondary | ICD-10-CM | POA: Diagnosis not present

## 2018-06-22 DIAGNOSIS — Z992 Dependence on renal dialysis: Secondary | ICD-10-CM | POA: Diagnosis not present

## 2018-06-22 DIAGNOSIS — Z4901 Encounter for fitting and adjustment of extracorporeal dialysis catheter: Secondary | ICD-10-CM | POA: Diagnosis not present

## 2018-06-22 DIAGNOSIS — J90 Pleural effusion, not elsewhere classified: Secondary | ICD-10-CM | POA: Diagnosis not present

## 2018-06-22 DIAGNOSIS — K259 Gastric ulcer, unspecified as acute or chronic, without hemorrhage or perforation: Secondary | ICD-10-CM | POA: Diagnosis not present

## 2018-06-22 DIAGNOSIS — R918 Other nonspecific abnormal finding of lung field: Secondary | ICD-10-CM | POA: Diagnosis not present

## 2018-06-22 HISTORY — DX: Acute kidney failure, unspecified: N17.9

## 2018-06-22 HISTORY — DX: Hypersensitivity angiitis: M31.0

## 2018-06-22 SURGERY — RELEASE, A1 PULLEY, FOR TRIGGER FINGER
Anesthesia: Choice | Laterality: Left

## 2018-07-05 DIAGNOSIS — F419 Anxiety disorder, unspecified: Secondary | ICD-10-CM

## 2018-07-05 DIAGNOSIS — D688 Other specified coagulation defects: Secondary | ICD-10-CM

## 2018-07-05 DIAGNOSIS — D649 Anemia, unspecified: Secondary | ICD-10-CM

## 2018-07-05 HISTORY — DX: Other specified coagulation defects: D68.8

## 2018-07-05 HISTORY — DX: Anemia, unspecified: D64.9

## 2018-07-05 HISTORY — DX: Anxiety disorder, unspecified: F41.9

## 2018-07-06 DIAGNOSIS — R0602 Shortness of breath: Secondary | ICD-10-CM | POA: Insufficient documentation

## 2018-07-06 DIAGNOSIS — D688 Other specified coagulation defects: Secondary | ICD-10-CM | POA: Diagnosis not present

## 2018-07-06 DIAGNOSIS — Z111 Encounter for screening for respiratory tuberculosis: Secondary | ICD-10-CM | POA: Diagnosis not present

## 2018-07-06 DIAGNOSIS — D509 Iron deficiency anemia, unspecified: Secondary | ICD-10-CM | POA: Diagnosis not present

## 2018-07-06 DIAGNOSIS — N179 Acute kidney failure, unspecified: Secondary | ICD-10-CM | POA: Diagnosis not present

## 2018-07-06 HISTORY — DX: Shortness of breath: R06.02

## 2018-07-07 DIAGNOSIS — Z992 Dependence on renal dialysis: Secondary | ICD-10-CM | POA: Diagnosis not present

## 2018-07-07 DIAGNOSIS — N186 End stage renal disease: Secondary | ICD-10-CM | POA: Diagnosis not present

## 2018-07-07 DIAGNOSIS — I120 Hypertensive chronic kidney disease with stage 5 chronic kidney disease or end stage renal disease: Secondary | ICD-10-CM | POA: Diagnosis not present

## 2018-07-07 DIAGNOSIS — N08 Glomerular disorders in diseases classified elsewhere: Secondary | ICD-10-CM | POA: Diagnosis not present

## 2018-07-07 DIAGNOSIS — E039 Hypothyroidism, unspecified: Secondary | ICD-10-CM | POA: Diagnosis not present

## 2018-07-07 DIAGNOSIS — K259 Gastric ulcer, unspecified as acute or chronic, without hemorrhage or perforation: Secondary | ICD-10-CM | POA: Diagnosis not present

## 2018-07-07 DIAGNOSIS — N179 Acute kidney failure, unspecified: Secondary | ICD-10-CM | POA: Diagnosis not present

## 2018-07-07 DIAGNOSIS — R69 Illness, unspecified: Secondary | ICD-10-CM | POA: Diagnosis not present

## 2018-07-07 DIAGNOSIS — I4891 Unspecified atrial fibrillation: Secondary | ICD-10-CM | POA: Diagnosis not present

## 2018-07-10 DIAGNOSIS — Z992 Dependence on renal dialysis: Secondary | ICD-10-CM | POA: Diagnosis not present

## 2018-07-10 DIAGNOSIS — R69 Illness, unspecified: Secondary | ICD-10-CM | POA: Diagnosis not present

## 2018-07-10 DIAGNOSIS — I120 Hypertensive chronic kidney disease with stage 5 chronic kidney disease or end stage renal disease: Secondary | ICD-10-CM | POA: Diagnosis not present

## 2018-07-10 DIAGNOSIS — N08 Glomerular disorders in diseases classified elsewhere: Secondary | ICD-10-CM | POA: Diagnosis not present

## 2018-07-10 DIAGNOSIS — K259 Gastric ulcer, unspecified as acute or chronic, without hemorrhage or perforation: Secondary | ICD-10-CM | POA: Diagnosis not present

## 2018-07-10 DIAGNOSIS — N186 End stage renal disease: Secondary | ICD-10-CM | POA: Diagnosis not present

## 2018-07-10 DIAGNOSIS — E039 Hypothyroidism, unspecified: Secondary | ICD-10-CM | POA: Diagnosis not present

## 2018-07-10 DIAGNOSIS — N179 Acute kidney failure, unspecified: Secondary | ICD-10-CM | POA: Diagnosis not present

## 2018-07-10 DIAGNOSIS — I4891 Unspecified atrial fibrillation: Secondary | ICD-10-CM | POA: Diagnosis not present

## 2018-07-11 DIAGNOSIS — N178 Other acute kidney failure: Secondary | ICD-10-CM | POA: Diagnosis not present

## 2018-07-11 DIAGNOSIS — R69 Illness, unspecified: Secondary | ICD-10-CM | POA: Diagnosis not present

## 2018-07-11 DIAGNOSIS — Z23 Encounter for immunization: Secondary | ICD-10-CM | POA: Diagnosis not present

## 2018-07-11 DIAGNOSIS — I129 Hypertensive chronic kidney disease with stage 1 through stage 4 chronic kidney disease, or unspecified chronic kidney disease: Secondary | ICD-10-CM | POA: Diagnosis not present

## 2018-07-12 DIAGNOSIS — D509 Iron deficiency anemia, unspecified: Secondary | ICD-10-CM

## 2018-07-12 DIAGNOSIS — N201 Calculus of ureter: Secondary | ICD-10-CM | POA: Diagnosis not present

## 2018-07-12 HISTORY — DX: Iron deficiency anemia, unspecified: D50.9

## 2018-07-13 DIAGNOSIS — N179 Acute kidney failure, unspecified: Secondary | ICD-10-CM | POA: Diagnosis not present

## 2018-07-17 DIAGNOSIS — Z992 Dependence on renal dialysis: Secondary | ICD-10-CM | POA: Diagnosis not present

## 2018-07-17 DIAGNOSIS — N08 Glomerular disorders in diseases classified elsewhere: Secondary | ICD-10-CM | POA: Diagnosis not present

## 2018-07-17 DIAGNOSIS — R69 Illness, unspecified: Secondary | ICD-10-CM | POA: Diagnosis not present

## 2018-07-17 DIAGNOSIS — I4891 Unspecified atrial fibrillation: Secondary | ICD-10-CM | POA: Diagnosis not present

## 2018-07-17 DIAGNOSIS — K259 Gastric ulcer, unspecified as acute or chronic, without hemorrhage or perforation: Secondary | ICD-10-CM | POA: Diagnosis not present

## 2018-07-17 DIAGNOSIS — N179 Acute kidney failure, unspecified: Secondary | ICD-10-CM | POA: Diagnosis not present

## 2018-07-17 DIAGNOSIS — N186 End stage renal disease: Secondary | ICD-10-CM | POA: Diagnosis not present

## 2018-07-17 DIAGNOSIS — I120 Hypertensive chronic kidney disease with stage 5 chronic kidney disease or end stage renal disease: Secondary | ICD-10-CM | POA: Diagnosis not present

## 2018-07-17 DIAGNOSIS — E039 Hypothyroidism, unspecified: Secondary | ICD-10-CM | POA: Diagnosis not present

## 2018-07-20 ENCOUNTER — Ambulatory Visit (HOSPITAL_COMMUNITY)
Admission: RE | Admit: 2018-07-20 | Discharge: 2018-07-20 | Disposition: A | Discharge status: Home / Self Care | Payer: Medicare HMO | Source: Ambulatory Visit | Attending: Nephrology | Admitting: Nephrology

## 2018-07-20 ENCOUNTER — Other Ambulatory Visit: Payer: Self-pay

## 2018-07-20 DIAGNOSIS — D509 Iron deficiency anemia, unspecified: Secondary | ICD-10-CM | POA: Diagnosis not present

## 2018-07-20 DIAGNOSIS — D649 Anemia, unspecified: Secondary | ICD-10-CM | POA: Diagnosis not present

## 2018-07-20 DIAGNOSIS — N179 Acute kidney failure, unspecified: Secondary | ICD-10-CM | POA: Diagnosis not present

## 2018-07-20 DIAGNOSIS — D688 Other specified coagulation defects: Secondary | ICD-10-CM | POA: Diagnosis not present

## 2018-07-20 DIAGNOSIS — Z111 Encounter for screening for respiratory tuberculosis: Secondary | ICD-10-CM | POA: Diagnosis not present

## 2018-07-20 LAB — ABO/RH: ABO/RH(D): A POS

## 2018-07-20 LAB — PREPARE RBC (CROSSMATCH)

## 2018-07-20 MED ORDER — SODIUM CHLORIDE 0.9% IV SOLUTION: Freq: Once | INTRAVENOUS | Status: DC

## 2018-07-20 NOTE — Discharge Instructions (Signed)
Blood Transfusion, Adult, Care After This sheet gives you information about how to care for yourself after your procedure. Your doctor may also give you more specific instructions. If you have problems or questions, contact your doctor. Follow these instructions at home:   Take over-the-counter and prescription medicines only as told by your doctor.  Go back to your normal activities as told by your doctor.  Follow instructions from your doctor about how to take care of the area where an IV tube was put into your vein (insertion site). Make sure you: ? Wash your hands with soap and water before you change your bandage (dressing). If there is no soap and water, use hand sanitizer. ? Change your bandage as told by your doctor.  Check your IV insertion site every day for signs of infection. Check for: ? More redness, swelling, or pain. ? More fluid or blood. ? Warmth. ? Pus or a bad smell. Contact a doctor if:  You have more redness, swelling, or pain around the IV insertion site.  You have more fluid or blood coming from the IV insertion site.  Your IV insertion site feels warm to the touch.  You have pus or a bad smell coming from the IV insertion site.  Your pee (urine) turns pink, red, or brown.  You feel weak after doing your normal activities. Get help right away if:  You have signs of a serious allergic or body defense (immune) system reaction, including: ? Itchiness. ? Hives. ? Trouble breathing. ? Anxiety. ? Pain in your chest or lower back. ? Fever, flushing, and chills. ? Fast pulse. ? Rash. ? Watery poop (diarrhea). ? Throwing up (vomiting). ? Dark pee. ? Serious headache. ? Dizziness. ? Stiff neck. ? Yellow color in your face or the white parts of your eyes (jaundice). Summary  After a blood transfusion, return to your normal activities as told by your doctor.  Every day, check for signs of infection where the IV tube was put into your vein.  Some  signs of infection are warm skin, more redness and pain, more fluid or blood, and pus or a bad smell where the needle went in.  Contact your doctor if you feel weak or have any unusual symptoms. This information is not intended to replace advice given to you by your health care provider. Make sure you discuss any questions you have with your health care provider. Document Released: 01/11/2014 Document Revised: 04/27/2017 Document Reviewed: 08/15/2015 Elsevier Patient Education  2020 [employer]  

## 2018-07-20 NOTE — Progress Notes (Signed)
PATIENT CARE CENTER NOTE  Diagnosis: Anemia    Provider: Mauricia Area, MD   Procedure: 1 unit PRBC's    Note: Patient received 1 unit of blood via PIV. Tolerated well with no adverse reaction. Vital signs stable. Discharge instructions given. Patient alert, oriented and ambulatory at discharge.

## 2018-07-21 LAB — BPAM RBC
Blood Product Expiration Date: 202008102359
ISSUE DATE / TIME: 202007161012
Unit Type and Rh: 6200

## 2018-07-21 LAB — TYPE AND SCREEN
ABO/RH(D): A POS
Antibody Screen: NEGATIVE
Unit division: 0

## 2018-07-24 DIAGNOSIS — N08 Glomerular disorders in diseases classified elsewhere: Secondary | ICD-10-CM | POA: Diagnosis not present

## 2018-07-24 DIAGNOSIS — K259 Gastric ulcer, unspecified as acute or chronic, without hemorrhage or perforation: Secondary | ICD-10-CM | POA: Diagnosis not present

## 2018-07-24 DIAGNOSIS — N186 End stage renal disease: Secondary | ICD-10-CM | POA: Diagnosis not present

## 2018-07-24 DIAGNOSIS — I120 Hypertensive chronic kidney disease with stage 5 chronic kidney disease or end stage renal disease: Secondary | ICD-10-CM | POA: Diagnosis not present

## 2018-07-24 DIAGNOSIS — R69 Illness, unspecified: Secondary | ICD-10-CM | POA: Diagnosis not present

## 2018-07-24 DIAGNOSIS — I4891 Unspecified atrial fibrillation: Secondary | ICD-10-CM | POA: Diagnosis not present

## 2018-07-24 DIAGNOSIS — Z992 Dependence on renal dialysis: Secondary | ICD-10-CM | POA: Diagnosis not present

## 2018-07-24 DIAGNOSIS — E039 Hypothyroidism, unspecified: Secondary | ICD-10-CM | POA: Diagnosis not present

## 2018-07-24 DIAGNOSIS — N179 Acute kidney failure, unspecified: Secondary | ICD-10-CM | POA: Diagnosis not present

## 2018-07-26 DIAGNOSIS — N201 Calculus of ureter: Secondary | ICD-10-CM | POA: Diagnosis not present

## 2018-07-28 DIAGNOSIS — L03315 Cellulitis of perineum: Secondary | ICD-10-CM | POA: Diagnosis not present

## 2018-08-02 ENCOUNTER — Other Ambulatory Visit: Payer: Self-pay | Admitting: Neurosurgery

## 2018-08-02 DIAGNOSIS — K259 Gastric ulcer, unspecified as acute or chronic, without hemorrhage or perforation: Secondary | ICD-10-CM | POA: Diagnosis not present

## 2018-08-02 DIAGNOSIS — N08 Glomerular disorders in diseases classified elsewhere: Secondary | ICD-10-CM | POA: Diagnosis not present

## 2018-08-02 DIAGNOSIS — R69 Illness, unspecified: Secondary | ICD-10-CM | POA: Diagnosis not present

## 2018-08-02 DIAGNOSIS — N186 End stage renal disease: Secondary | ICD-10-CM | POA: Diagnosis not present

## 2018-08-02 DIAGNOSIS — I4891 Unspecified atrial fibrillation: Secondary | ICD-10-CM | POA: Diagnosis not present

## 2018-08-02 DIAGNOSIS — N179 Acute kidney failure, unspecified: Secondary | ICD-10-CM | POA: Diagnosis not present

## 2018-08-02 DIAGNOSIS — Z992 Dependence on renal dialysis: Secondary | ICD-10-CM | POA: Diagnosis not present

## 2018-08-02 DIAGNOSIS — I120 Hypertensive chronic kidney disease with stage 5 chronic kidney disease or end stage renal disease: Secondary | ICD-10-CM | POA: Diagnosis not present

## 2018-08-02 DIAGNOSIS — E039 Hypothyroidism, unspecified: Secondary | ICD-10-CM | POA: Diagnosis not present

## 2018-08-02 DIAGNOSIS — I671 Cerebral aneurysm, nonruptured: Secondary | ICD-10-CM

## 2018-08-03 DIAGNOSIS — N179 Acute kidney failure, unspecified: Secondary | ICD-10-CM | POA: Diagnosis not present

## 2018-08-04 DIAGNOSIS — L03315 Cellulitis of perineum: Secondary | ICD-10-CM | POA: Diagnosis not present

## 2018-08-05 DIAGNOSIS — N2581 Secondary hyperparathyroidism of renal origin: Secondary | ICD-10-CM | POA: Diagnosis not present

## 2018-08-05 DIAGNOSIS — Z992 Dependence on renal dialysis: Secondary | ICD-10-CM | POA: Diagnosis not present

## 2018-08-05 DIAGNOSIS — M310 Hypersensitivity angiitis: Secondary | ICD-10-CM | POA: Diagnosis not present

## 2018-08-05 DIAGNOSIS — D688 Other specified coagulation defects: Secondary | ICD-10-CM | POA: Diagnosis not present

## 2018-08-05 DIAGNOSIS — N179 Acute kidney failure, unspecified: Secondary | ICD-10-CM | POA: Diagnosis not present

## 2018-08-11 DIAGNOSIS — K259 Gastric ulcer, unspecified as acute or chronic, without hemorrhage or perforation: Secondary | ICD-10-CM | POA: Diagnosis not present

## 2018-08-11 DIAGNOSIS — N08 Glomerular disorders in diseases classified elsewhere: Secondary | ICD-10-CM | POA: Diagnosis not present

## 2018-08-11 DIAGNOSIS — R69 Illness, unspecified: Secondary | ICD-10-CM | POA: Diagnosis not present

## 2018-08-11 DIAGNOSIS — E039 Hypothyroidism, unspecified: Secondary | ICD-10-CM | POA: Diagnosis not present

## 2018-08-11 DIAGNOSIS — N186 End stage renal disease: Secondary | ICD-10-CM | POA: Diagnosis not present

## 2018-08-11 DIAGNOSIS — Z992 Dependence on renal dialysis: Secondary | ICD-10-CM | POA: Diagnosis not present

## 2018-08-11 DIAGNOSIS — I120 Hypertensive chronic kidney disease with stage 5 chronic kidney disease or end stage renal disease: Secondary | ICD-10-CM | POA: Diagnosis not present

## 2018-08-11 DIAGNOSIS — I4891 Unspecified atrial fibrillation: Secondary | ICD-10-CM | POA: Diagnosis not present

## 2018-08-11 DIAGNOSIS — N179 Acute kidney failure, unspecified: Secondary | ICD-10-CM | POA: Diagnosis not present

## 2018-08-16 DIAGNOSIS — E039 Hypothyroidism, unspecified: Secondary | ICD-10-CM | POA: Diagnosis not present

## 2018-08-16 DIAGNOSIS — I120 Hypertensive chronic kidney disease with stage 5 chronic kidney disease or end stage renal disease: Secondary | ICD-10-CM | POA: Diagnosis not present

## 2018-08-16 DIAGNOSIS — H9202 Otalgia, left ear: Secondary | ICD-10-CM | POA: Diagnosis not present

## 2018-08-16 DIAGNOSIS — K259 Gastric ulcer, unspecified as acute or chronic, without hemorrhage or perforation: Secondary | ICD-10-CM | POA: Diagnosis not present

## 2018-08-16 DIAGNOSIS — M2669 Other specified disorders of temporomandibular joint: Secondary | ICD-10-CM

## 2018-08-16 DIAGNOSIS — N179 Acute kidney failure, unspecified: Secondary | ICD-10-CM | POA: Diagnosis not present

## 2018-08-16 DIAGNOSIS — Z992 Dependence on renal dialysis: Secondary | ICD-10-CM | POA: Diagnosis not present

## 2018-08-16 DIAGNOSIS — N186 End stage renal disease: Secondary | ICD-10-CM | POA: Diagnosis not present

## 2018-08-16 DIAGNOSIS — R69 Illness, unspecified: Secondary | ICD-10-CM | POA: Diagnosis not present

## 2018-08-16 DIAGNOSIS — N08 Glomerular disorders in diseases classified elsewhere: Secondary | ICD-10-CM | POA: Diagnosis not present

## 2018-08-16 DIAGNOSIS — I4891 Unspecified atrial fibrillation: Secondary | ICD-10-CM | POA: Diagnosis not present

## 2018-08-16 HISTORY — DX: Other specified disorders of temporomandibular joint: M26.69

## 2018-08-16 HISTORY — DX: Otalgia, left ear: H92.02

## 2018-08-18 DIAGNOSIS — L03315 Cellulitis of perineum: Secondary | ICD-10-CM | POA: Diagnosis not present

## 2018-08-21 DIAGNOSIS — H2513 Age-related nuclear cataract, bilateral: Secondary | ICD-10-CM | POA: Diagnosis not present

## 2018-08-21 DIAGNOSIS — H35372 Puckering of macula, left eye: Secondary | ICD-10-CM | POA: Diagnosis not present

## 2018-08-21 DIAGNOSIS — H16223 Keratoconjunctivitis sicca, not specified as Sjogren's, bilateral: Secondary | ICD-10-CM | POA: Diagnosis not present

## 2018-08-21 DIAGNOSIS — H43811 Vitreous degeneration, right eye: Secondary | ICD-10-CM | POA: Diagnosis not present

## 2018-08-21 DIAGNOSIS — H40023 Open angle with borderline findings, high risk, bilateral: Secondary | ICD-10-CM | POA: Diagnosis not present

## 2018-08-21 DIAGNOSIS — D3131 Benign neoplasm of right choroid: Secondary | ICD-10-CM | POA: Diagnosis not present

## 2018-08-21 DIAGNOSIS — G510 Bell's palsy: Secondary | ICD-10-CM | POA: Diagnosis not present

## 2018-08-22 DIAGNOSIS — N2581 Secondary hyperparathyroidism of renal origin: Secondary | ICD-10-CM | POA: Insufficient documentation

## 2018-08-22 HISTORY — DX: Secondary hyperparathyroidism of renal origin: N25.81

## 2018-08-24 DIAGNOSIS — Z992 Dependence on renal dialysis: Secondary | ICD-10-CM | POA: Diagnosis not present

## 2018-08-24 DIAGNOSIS — N179 Acute kidney failure, unspecified: Secondary | ICD-10-CM | POA: Diagnosis not present

## 2018-08-24 DIAGNOSIS — N2581 Secondary hyperparathyroidism of renal origin: Secondary | ICD-10-CM | POA: Diagnosis not present

## 2018-08-24 DIAGNOSIS — M310 Hypersensitivity angiitis: Secondary | ICD-10-CM | POA: Diagnosis not present

## 2018-08-24 DIAGNOSIS — D688 Other specified coagulation defects: Secondary | ICD-10-CM | POA: Diagnosis not present

## 2018-08-25 DIAGNOSIS — N189 Chronic kidney disease, unspecified: Secondary | ICD-10-CM | POA: Insufficient documentation

## 2018-08-25 DIAGNOSIS — N186 End stage renal disease: Secondary | ICD-10-CM

## 2018-08-25 DIAGNOSIS — D631 Anemia in chronic kidney disease: Secondary | ICD-10-CM

## 2018-08-25 HISTORY — DX: Anemia in chronic kidney disease: D63.1

## 2018-08-25 HISTORY — DX: Chronic kidney disease, unspecified: N18.9

## 2018-08-25 HISTORY — DX: End stage renal disease: N18.6

## 2018-08-26 DIAGNOSIS — N2581 Secondary hyperparathyroidism of renal origin: Secondary | ICD-10-CM | POA: Diagnosis not present

## 2018-08-26 DIAGNOSIS — N186 End stage renal disease: Secondary | ICD-10-CM | POA: Diagnosis not present

## 2018-08-26 DIAGNOSIS — D509 Iron deficiency anemia, unspecified: Secondary | ICD-10-CM | POA: Diagnosis not present

## 2018-08-26 DIAGNOSIS — D688 Other specified coagulation defects: Secondary | ICD-10-CM | POA: Diagnosis not present

## 2018-08-26 DIAGNOSIS — Z992 Dependence on renal dialysis: Secondary | ICD-10-CM | POA: Diagnosis not present

## 2018-08-26 DIAGNOSIS — D631 Anemia in chronic kidney disease: Secondary | ICD-10-CM | POA: Diagnosis not present

## 2018-09-01 ENCOUNTER — Ambulatory Visit
Admission: RE | Admit: 2018-09-01 | Discharge: 2018-09-01 | Disposition: A | Discharge status: Home / Self Care | Payer: Medicare HMO | Source: Ambulatory Visit | Attending: Neurosurgery | Admitting: Neurosurgery

## 2018-09-01 ENCOUNTER — Other Ambulatory Visit: Payer: Self-pay

## 2018-09-01 ENCOUNTER — Other Ambulatory Visit: Payer: Medicare HMO

## 2018-09-01 DIAGNOSIS — I671 Cerebral aneurysm, nonruptured: Secondary | ICD-10-CM

## 2018-09-04 DIAGNOSIS — Z992 Dependence on renal dialysis: Secondary | ICD-10-CM | POA: Diagnosis not present

## 2018-09-04 DIAGNOSIS — N019 Rapidly progressive nephritic syndrome with unspecified morphologic changes: Secondary | ICD-10-CM | POA: Diagnosis not present

## 2018-09-04 DIAGNOSIS — N186 End stage renal disease: Secondary | ICD-10-CM | POA: Diagnosis not present

## 2018-09-05 DIAGNOSIS — M310 Hypersensitivity angiitis: Secondary | ICD-10-CM | POA: Diagnosis not present

## 2018-09-05 DIAGNOSIS — D688 Other specified coagulation defects: Secondary | ICD-10-CM | POA: Diagnosis not present

## 2018-09-05 DIAGNOSIS — N186 End stage renal disease: Secondary | ICD-10-CM | POA: Diagnosis not present

## 2018-09-05 DIAGNOSIS — D631 Anemia in chronic kidney disease: Secondary | ICD-10-CM | POA: Diagnosis not present

## 2018-09-05 DIAGNOSIS — N2581 Secondary hyperparathyroidism of renal origin: Secondary | ICD-10-CM | POA: Diagnosis not present

## 2018-09-05 DIAGNOSIS — D509 Iron deficiency anemia, unspecified: Secondary | ICD-10-CM | POA: Diagnosis not present

## 2018-09-05 DIAGNOSIS — Z23 Encounter for immunization: Secondary | ICD-10-CM | POA: Diagnosis not present

## 2018-09-05 DIAGNOSIS — Z992 Dependence on renal dialysis: Secondary | ICD-10-CM | POA: Diagnosis not present

## 2018-09-12 ENCOUNTER — Other Ambulatory Visit: Payer: Self-pay

## 2018-09-12 DIAGNOSIS — N179 Acute kidney failure, unspecified: Secondary | ICD-10-CM

## 2018-09-15 ENCOUNTER — Ambulatory Visit (INDEPENDENT_AMBULATORY_CARE_PROVIDER_SITE_OTHER): Payer: Medicare HMO | Admitting: Vascular Surgery

## 2018-09-15 ENCOUNTER — Encounter: Payer: Self-pay | Admitting: Vascular Surgery

## 2018-09-15 ENCOUNTER — Ambulatory Visit (HOSPITAL_COMMUNITY)
Admission: RE | Admit: 2018-09-15 | Discharge: 2018-09-15 | Disposition: A | Discharge status: Home / Self Care | Payer: Medicare HMO | Source: Ambulatory Visit | Attending: Vascular Surgery | Admitting: Vascular Surgery

## 2018-09-15 ENCOUNTER — Encounter: Payer: Self-pay | Admitting: *Deleted

## 2018-09-15 ENCOUNTER — Other Ambulatory Visit: Payer: Self-pay | Admitting: *Deleted

## 2018-09-15 ENCOUNTER — Ambulatory Visit (INDEPENDENT_AMBULATORY_CARE_PROVIDER_SITE_OTHER)
Admission: RE | Admit: 2018-09-15 | Discharge: 2018-09-15 | Disposition: A | Discharge status: Home / Self Care | Payer: Medicare HMO | Source: Ambulatory Visit | Attending: Vascular Surgery | Admitting: Vascular Surgery

## 2018-09-15 ENCOUNTER — Other Ambulatory Visit: Payer: Self-pay

## 2018-09-15 VITALS — BP 110/63 | HR 69 | Temp 97.7°F | Resp 20 | Ht 64.0 in | Wt 146.0 lb

## 2018-09-15 DIAGNOSIS — N186 End stage renal disease: Secondary | ICD-10-CM | POA: Diagnosis not present

## 2018-09-15 DIAGNOSIS — N179 Acute kidney failure, unspecified: Secondary | ICD-10-CM

## 2018-09-15 NOTE — Progress Notes (Signed)
Patient ID: Cristina Wilson, female   DOB: [birth year], 68 y.o.   MRN: [id number]  Reason for Consult: New Patient (Initial Visit)   Referred by Lajean Manes, MD  Subjective:     HPI:  Cristina Wilson is a 68 y.o. 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  Currently on dialysis via right IJ catheter since May.  She is now here for consideration of permanent access.  She has never had any port placement pacemaker on either side.  She is right-hand dominant.  She has had a lumpectomy in the past no axillary lymph nodes dissected.  Has had left wrist surgery no other upper extremity surgeries in the past.  She does not take blood thinners.  Past Medical History:  Diagnosis Date  . Arthritis   . Bell's palsy 2017   "mild case"  . Cerebral aneurysm 08/2014   pt. states she has 2 aneurysms  . Closed fracture of left distal radius   . GERD (gastroesophageal reflux disease)   . Hypertension   . Hypothyroidism   . PONV (postoperative nausea and vomiting)    violent vomiting  . Thyroid disease    Family History  Problem Relation Age of Onset  . COPD Mother   . Kidney disease Mother   . Heart disease Mother   . Cancer Father    Past Surgical History:  Procedure Laterality Date  . ARTERY BIOPSY Right 10/31/2014   Procedure: BIOPSY TEMPORAL ARTERY-RIGHT;  Surgeon: Mal Misty, MD;  Location: [facility];  Service: Vascular;  Laterality: Right;  . BILATERAL CARPAL TUNNEL RELEASE    . BREAST SURGERY Left    lumpectomy  . BUNIONECTOMY Right   . CHOLECYSTECTOMY N/A 10/13/2016   Procedure: LAPAROSCOPIC CHOLECYSTECTOMY WITH INTRAOPERATIVE CHOLANGIOGRAM;  Surgeon: Donnie Mesa, MD;  Location: [place];  Service: General;  Laterality: N/A;  . EYE SURGERY     surgery for cross eye  . FOOT FRACTURE SURGERY    . OPEN REDUCTION INTERNAL FIXATION (ORIF) DISTAL RADIAL FRACTURE Left 03/29/2017   Procedure: OPEN REDUCTION INTERNAL FIXATION (ORIF)LEFT   DISTAL RADIAL FRACTURE;  Surgeon: Leanora Cover, MD;  Location: [facility];  Service: Orthopedics;  Laterality: Left;  . THYROIDECTOMY    . TONSILLECTOMY    . WISDOM TOOTH EXTRACTION      Short Social History:  Social History   Tobacco Use  . Smoking status: Former Smoker    Years: 0.50    Types: Cigarettes    Quit date: 08/07/1989    Years since quitting: 29.1  . Smokeless tobacco: Never Used  Substance Use Topics  . Alcohol use: Yes    Alcohol/week: 7.0 standard drinks    Types: 7 Glasses of wine per week    Comment: a glass a night, some nights more    Allergies  Allergen Reactions  . Sulfa Antibiotics Rash    Current Outpatient Medications  Medication Sig Dispense Refill  . amLODipine (NORVASC) 10 MG tablet TAKE 1 TABLET BY MOUTH ONCE DAILY    . B Complex-C-Folic Acid (RENAL-VITE) 0.8 MG TABS TAKE 1 TABLET BY MOUTH ONCE DAILY    . carvedilol (COREG) 12.5 MG tablet Take 12.5 mg by mouth 2 (two) times daily.    . cholecalciferol (VITAMIN D) 1000 units tablet Take 1,000 Units by mouth daily.    . cyclophosphamide (CYTOXAN) 50 MG capsule TAKE 1 CAPSULE BY MOUTH ONCE DAILY    . dapsone 100 MG tablet Take 100 mg  by mouth daily.    Marland Kitchen levothyroxine (SYNTHROID, LEVOTHROID) 125 MCG tablet Take 125 mcg by mouth daily before breakfast.     . pregabalin (LYRICA) 50 MG capsule Take 50 mg by mouth 2 (two) times daily.    . sevelamer carbonate (RENVELA) 800 MG tablet Take by mouth.    . traZODone (DESYREL) 50 MG tablet TAKE 1 2 (ONE HALF) TABLET BY MOUTH ONCE DAILY AT BEDTIME     No current facility-administered medications for this visit.     Review of Systems  Constitutional:  Constitutional negative. HENT: HENT negative.  Eyes: Eyes negative.  Cardiovascular: Cardiovascular negative.  GI: Gastrointestinal negative.  Musculoskeletal: Musculoskeletal negative.  Skin: Skin negative.  Neurological: Neurological negative. Hematologic: Hematologic/lymphatic negative.   Psychiatric: Psychiatric negative.        Objective:  Objective   There were no vitals filed for this visit. There is no height or weight on file to calculate BMI.  Physical Exam Constitutional:      Appearance: Normal appearance.  HENT:     Head: Normocephalic.     Nose: Nose normal.     Mouth/Throat:     Mouth: Mucous membranes are moist.  Eyes:     Pupils: Pupils are equal, round, and reactive to light.  Neck:     Musculoskeletal: Normal range of motion and neck supple.  Cardiovascular:     Rate and Rhythm: Normal rate and regular rhythm.     Pulses:          Radial pulses are 2+ on the right side and 2+ on the left side.  Pulmonary:     Effort: Pulmonary effort is normal.  Abdominal:     General: Abdomen is flat.     Palpations: Abdomen is soft.  Musculoskeletal: Normal range of motion.        General: No swelling.  Skin:    General: Skin is warm and dry.     Capillary Refill: Capillary refill takes less than 2 seconds.  Neurological:     Mental Status: She is alert.  Psychiatric:        Mood and Affect: Mood normal.        Behavior: Behavior normal.        Thought Content: Thought content normal.        Judgment: Judgment normal.     Data: I have independently interpreted her upper extremity vein mapping which demonstrates suitable right cephalic and basilic vein and left cephalic and basilic vein for fistula creation.  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  She is right-hand dominant appears to have suitable cephalic vein on the left as well as basilic vein.  We discussed risk benefits alternatives to proceeding with dialysis access we will get her scheduled in the  very near future.     Waynetta Sandy MD [facility]

## 2018-09-18 DIAGNOSIS — H401112 Primary open-angle glaucoma, right eye, moderate stage: Secondary | ICD-10-CM | POA: Diagnosis not present

## 2018-09-18 DIAGNOSIS — H16223 Keratoconjunctivitis sicca, not specified as Sjogren's, bilateral: Secondary | ICD-10-CM | POA: Diagnosis not present

## 2018-09-18 DIAGNOSIS — H2513 Age-related nuclear cataract, bilateral: Secondary | ICD-10-CM | POA: Diagnosis not present

## 2018-09-18 DIAGNOSIS — D3131 Benign neoplasm of right choroid: Secondary | ICD-10-CM | POA: Diagnosis not present

## 2018-09-18 DIAGNOSIS — H401121 Primary open-angle glaucoma, left eye, mild stage: Secondary | ICD-10-CM | POA: Diagnosis not present

## 2018-09-18 DIAGNOSIS — H43811 Vitreous degeneration, right eye: Secondary | ICD-10-CM | POA: Diagnosis not present

## 2018-09-18 DIAGNOSIS — H35372 Puckering of macula, left eye: Secondary | ICD-10-CM | POA: Diagnosis not present

## 2018-09-18 DIAGNOSIS — G510 Bell's palsy: Secondary | ICD-10-CM | POA: Diagnosis not present

## 2018-09-23 DIAGNOSIS — Z23 Encounter for immunization: Secondary | ICD-10-CM | POA: Diagnosis not present

## 2018-09-23 DIAGNOSIS — Z992 Dependence on renal dialysis: Secondary | ICD-10-CM | POA: Diagnosis not present

## 2018-09-23 DIAGNOSIS — D688 Other specified coagulation defects: Secondary | ICD-10-CM | POA: Diagnosis not present

## 2018-09-23 DIAGNOSIS — M310 Hypersensitivity angiitis: Secondary | ICD-10-CM | POA: Diagnosis not present

## 2018-09-23 DIAGNOSIS — D509 Iron deficiency anemia, unspecified: Secondary | ICD-10-CM | POA: Diagnosis not present

## 2018-09-23 DIAGNOSIS — N186 End stage renal disease: Secondary | ICD-10-CM | POA: Diagnosis not present

## 2018-09-23 DIAGNOSIS — D631 Anemia in chronic kidney disease: Secondary | ICD-10-CM | POA: Diagnosis not present

## 2018-09-23 DIAGNOSIS — N2581 Secondary hyperparathyroidism of renal origin: Secondary | ICD-10-CM | POA: Diagnosis not present

## 2018-09-26 DIAGNOSIS — Z23 Encounter for immunization: Secondary | ICD-10-CM | POA: Insufficient documentation

## 2018-10-04 DIAGNOSIS — N186 End stage renal disease: Secondary | ICD-10-CM | POA: Diagnosis not present

## 2018-10-04 DIAGNOSIS — Z992 Dependence on renal dialysis: Secondary | ICD-10-CM | POA: Diagnosis not present

## 2018-10-04 DIAGNOSIS — N019 Rapidly progressive nephritic syndrome with unspecified morphologic changes: Secondary | ICD-10-CM | POA: Diagnosis not present

## 2018-10-05 DIAGNOSIS — D631 Anemia in chronic kidney disease: Secondary | ICD-10-CM | POA: Diagnosis not present

## 2018-10-05 DIAGNOSIS — D688 Other specified coagulation defects: Secondary | ICD-10-CM | POA: Diagnosis not present

## 2018-10-05 DIAGNOSIS — N2581 Secondary hyperparathyroidism of renal origin: Secondary | ICD-10-CM | POA: Diagnosis not present

## 2018-10-05 DIAGNOSIS — Z992 Dependence on renal dialysis: Secondary | ICD-10-CM | POA: Diagnosis not present

## 2018-10-05 DIAGNOSIS — N186 End stage renal disease: Secondary | ICD-10-CM | POA: Diagnosis not present

## 2018-10-05 DIAGNOSIS — D509 Iron deficiency anemia, unspecified: Secondary | ICD-10-CM | POA: Diagnosis not present

## 2018-10-06 ENCOUNTER — Other Ambulatory Visit: Payer: Self-pay | Admitting: Geriatric Medicine

## 2018-10-06 DIAGNOSIS — N186 End stage renal disease: Secondary | ICD-10-CM | POA: Diagnosis not present

## 2018-10-06 DIAGNOSIS — R1313 Dysphagia, pharyngeal phase: Secondary | ICD-10-CM

## 2018-10-06 DIAGNOSIS — I129 Hypertensive chronic kidney disease with stage 1 through stage 4 chronic kidney disease, or unspecified chronic kidney disease: Secondary | ICD-10-CM | POA: Diagnosis not present

## 2018-10-09 ENCOUNTER — Encounter (HOSPITAL_COMMUNITY): Payer: Self-pay | Admitting: *Deleted

## 2018-10-09 ENCOUNTER — Other Ambulatory Visit (HOSPITAL_COMMUNITY)
Admission: RE | Admit: 2018-10-09 | Discharge: 2018-10-09 | Disposition: A | Discharge status: Home / Self Care | Payer: Medicare HMO | Source: Ambulatory Visit | Attending: Vascular Surgery | Admitting: Vascular Surgery

## 2018-10-09 DIAGNOSIS — Z20828 Contact with and (suspected) exposure to other viral communicable diseases: Secondary | ICD-10-CM | POA: Diagnosis not present

## 2018-10-09 DIAGNOSIS — Z01818 Encounter for other preprocedural examination: Secondary | ICD-10-CM | POA: Diagnosis not present

## 2018-10-09 LAB — SARS CORONAVIRUS 2 (TAT 6-24 HRS): SARS Coronavirus 2: NEGATIVE

## 2018-10-09 NOTE — Progress Notes (Signed)
Denies chest pain, shob, or cardiology visit. Educated on Environmental manager. Reports severe n/v with anesthesia.

## 2018-10-11 ENCOUNTER — Other Ambulatory Visit: Payer: Self-pay

## 2018-10-11 ENCOUNTER — Encounter (HOSPITAL_COMMUNITY)
Admission: RE | Disposition: A | Discharge status: Home / Self Care | Payer: Self-pay | Source: Home / Self Care | Attending: Vascular Surgery

## 2018-10-11 ENCOUNTER — Other Ambulatory Visit: Payer: Medicare HMO

## 2018-10-11 ENCOUNTER — Ambulatory Visit (HOSPITAL_COMMUNITY)
Admission: RE | Admit: 2018-10-11 | Discharge: 2018-10-11 | Disposition: A | Discharge status: Home / Self Care | Payer: Medicare HMO | Attending: Vascular Surgery | Admitting: Vascular Surgery

## 2018-10-11 ENCOUNTER — Ambulatory Visit (HOSPITAL_COMMUNITY): Payer: Medicare HMO | Admitting: Anesthesiology

## 2018-10-11 ENCOUNTER — Encounter (HOSPITAL_COMMUNITY): Payer: Self-pay | Admitting: Surgery

## 2018-10-11 DIAGNOSIS — I671 Cerebral aneurysm, nonruptured: Secondary | ICD-10-CM | POA: Diagnosis not present

## 2018-10-11 DIAGNOSIS — I120 Hypertensive chronic kidney disease with stage 5 chronic kidney disease or end stage renal disease: Secondary | ICD-10-CM | POA: Insufficient documentation

## 2018-10-11 DIAGNOSIS — Z87891 Personal history of nicotine dependence: Secondary | ICD-10-CM | POA: Diagnosis not present

## 2018-10-11 DIAGNOSIS — Z841 Family history of disorders of kidney and ureter: Secondary | ICD-10-CM | POA: Diagnosis not present

## 2018-10-11 DIAGNOSIS — N186 End stage renal disease: Secondary | ICD-10-CM | POA: Diagnosis not present

## 2018-10-11 DIAGNOSIS — Z79890 Hormone replacement therapy (postmenopausal): Secondary | ICD-10-CM | POA: Insufficient documentation

## 2018-10-11 DIAGNOSIS — Z992 Dependence on renal dialysis: Secondary | ICD-10-CM | POA: Diagnosis not present

## 2018-10-11 DIAGNOSIS — E039 Hypothyroidism, unspecified: Secondary | ICD-10-CM | POA: Diagnosis not present

## 2018-10-11 DIAGNOSIS — Z79899 Other long term (current) drug therapy: Secondary | ICD-10-CM | POA: Diagnosis not present

## 2018-10-11 HISTORY — DX: End stage renal disease: N18.6

## 2018-10-11 HISTORY — PX: AV FISTULA PLACEMENT: SHX1204

## 2018-10-11 HISTORY — DX: Hypersensitivity angiitis: M31.0

## 2018-10-11 LAB — POCT I-STAT, CHEM 8
BUN: 30 mg/dL — ABNORMAL HIGH (ref 8–23)
Calcium, Ion: 0.93 mmol/L — ABNORMAL LOW (ref 1.15–1.40)
Chloride: 100 mmol/L (ref 98–111)
Creatinine, Ser: 6.9 mg/dL — ABNORMAL HIGH (ref 0.44–1.00)
Glucose, Bld: 89 mg/dL (ref 70–99)
HCT: 33 % — ABNORMAL LOW (ref 36.0–46.0)
Hemoglobin: 11.2 g/dL — ABNORMAL LOW (ref 12.0–15.0)
Potassium: 3.5 mmol/L (ref 3.5–5.1)
Sodium: 142 mmol/L (ref 135–145)
TCO2: 26 mmol/L (ref 22–32)

## 2018-10-11 SURGERY — ARTERIOVENOUS (AV) FISTULA CREATION
Anesthesia: Monitor Anesthesia Care | Site: Arm Lower | Laterality: Left

## 2018-10-11 MED ORDER — SODIUM CHLORIDE 0.9 % IV SOLN
INTRAVENOUS | Status: DC
  Administered 2018-10-11: 08:00:00 via INTRAVENOUS

## 2018-10-11 MED ORDER — PROPOFOL 10 MG/ML IV BOLUS
INTRAVENOUS | Status: AC
  Filled 2018-10-11: qty 40

## 2018-10-11 MED ORDER — MIDAZOLAM HCL 2 MG/2ML IJ SOLN
INTRAMUSCULAR | Status: AC
  Filled 2018-10-11: qty 2

## 2018-10-11 MED ORDER — OXYCODONE HCL 5 MG PO TABS
ORAL_TABLET | ORAL | Status: AC
  Filled 2018-10-11: qty 1

## 2018-10-11 MED ORDER — ONDANSETRON HCL 4 MG/2ML IJ SOLN: 4.0000 mg | Freq: Once | INTRAMUSCULAR | Status: DC | PRN

## 2018-10-11 MED ORDER — SODIUM CHLORIDE 0.9 % IV SOLN
INTRAVENOUS | Status: AC
  Filled 2018-10-11: qty 1.2

## 2018-10-11 MED ORDER — CEFAZOLIN SODIUM-DEXTROSE 2-4 GM/100ML-% IV SOLN
INTRAVENOUS | Status: AC
  Filled 2018-10-11: qty 100

## 2018-10-11 MED ORDER — SODIUM CHLORIDE 0.9 % IV SOLN
INTRAVENOUS | Status: DC | PRN
  Administered 2018-10-11: 500 mL

## 2018-10-11 MED ORDER — LIDOCAINE-EPINEPHRINE (PF) 1 %-1:200000 IJ SOLN
INTRAMUSCULAR | Status: AC
  Filled 2018-10-11: qty 30

## 2018-10-11 MED ORDER — OXYCODONE HCL 5 MG/5ML PO SOLN: 5.0000 mg | Freq: Once | ORAL | Status: DC | PRN

## 2018-10-11 MED ORDER — PROPOFOL 1000 MG/100ML IV EMUL
INTRAVENOUS | Status: AC
  Filled 2018-10-11: qty 200

## 2018-10-11 MED ORDER — OXYCODONE HCL 5 MG PO TABS: 5.0000 mg | ORAL_TABLET | Freq: Once | ORAL | Status: DC | PRN

## 2018-10-11 MED ORDER — CEFAZOLIN SODIUM-DEXTROSE 2-4 GM/100ML-% IV SOLN
2.0000 g | INTRAVENOUS | Status: AC
  Administered 2018-10-11: 2 g via INTRAVENOUS

## 2018-10-11 MED ORDER — FENTANYL CITRATE (PF) 100 MCG/2ML IJ SOLN
INTRAMUSCULAR | Status: DC | PRN
  Administered 2018-10-11 (×2): 50 ug via INTRAVENOUS

## 2018-10-11 MED ORDER — MIDAZOLAM HCL 5 MG/5ML IJ SOLN
INTRAMUSCULAR | Status: DC | PRN
  Administered 2018-10-11 (×2): 1 mg via INTRAVENOUS

## 2018-10-11 MED ORDER — FENTANYL CITRATE (PF) 100 MCG/2ML IJ SOLN: 25.0000 ug | INTRAMUSCULAR | Status: DC | PRN

## 2018-10-11 MED ORDER — LIDOCAINE-EPINEPHRINE (PF) 1 %-1:200000 IJ SOLN
INTRAMUSCULAR | Status: DC | PRN
  Administered 2018-10-11: 10 mL via INTRADERMAL

## 2018-10-11 MED ORDER — OXYCODONE HCL 5 MG/5ML PO SOLN: 5.0000 mg | Freq: Once | ORAL | Status: AC | PRN

## 2018-10-11 MED ORDER — FENTANYL CITRATE (PF) 250 MCG/5ML IJ SOLN
INTRAMUSCULAR | Status: AC
  Filled 2018-10-11: qty 5

## 2018-10-11 MED ORDER — 0.9 % SODIUM CHLORIDE (POUR BTL) OPTIME
TOPICAL | Status: DC | PRN
  Administered 2018-10-11: 09:00:00 1000 mL

## 2018-10-11 MED ORDER — OXYCODONE HCL 5 MG PO TABS: 5.0000 mg | ORAL_TABLET | Freq: Three times a day (TID) | ORAL | 0 refills | Status: DC | PRN

## 2018-10-11 MED ORDER — OXYCODONE HCL 5 MG PO TABS
5.0000 mg | ORAL_TABLET | Freq: Once | ORAL | Status: AC | PRN
  Administered 2018-10-11: 5 mg via ORAL

## 2018-10-11 MED ORDER — PHENYLEPHRINE HCL-NACL 10-0.9 MG/250ML-% IV SOLN
INTRAVENOUS | Status: AC
  Filled 2018-10-11: qty 500

## 2018-10-11 MED ORDER — LIDOCAINE HCL (PF) 1 % IJ SOLN
INTRAMUSCULAR | Status: AC
  Filled 2018-10-11: qty 30

## 2018-10-11 MED ORDER — PROPOFOL 500 MG/50ML IV EMUL
INTRAVENOUS | Status: DC | PRN
  Administered 2018-10-11: 35 ug/kg/min via INTRAVENOUS

## 2018-10-11 SURGICAL SUPPLY — 28 items
ARMBAND PINK RESTRICT EXTREMIT (MISCELLANEOUS) ×2 IMPLANT
CANISTER SUCT 3000ML PPV (MISCELLANEOUS) ×2 IMPLANT
CLIP VESOCCLUDE MED 6/CT (CLIP) ×2 IMPLANT
CLIP VESOCCLUDE SM WIDE 6/CT (CLIP) ×2 IMPLANT
COVER PROBE W GEL 5X96 (DRAPES) IMPLANT
COVER WAND RF STERILE (DRAPES) IMPLANT
DERMABOND ADVANCED (GAUZE/BANDAGES/DRESSINGS) ×1
DERMABOND ADVANCED .7 DNX12 (GAUZE/BANDAGES/DRESSINGS) ×1 IMPLANT
ELECT REM PT RETURN 9FT ADLT (ELECTROSURGICAL) ×2
ELECTRODE REM PT RTRN 9FT ADLT (ELECTROSURGICAL) ×1 IMPLANT
GLOVE BIO SURGEON STRL SZ7.5 (GLOVE) ×2 IMPLANT
GOWN STRL REUS W/ TWL LRG LVL3 (GOWN DISPOSABLE) ×2 IMPLANT
GOWN STRL REUS W/ TWL XL LVL3 (GOWN DISPOSABLE) ×1 IMPLANT
GOWN STRL REUS W/TWL LRG LVL3 (GOWN DISPOSABLE) ×2
GOWN STRL REUS W/TWL XL LVL3 (GOWN DISPOSABLE) ×1
INSERT FOGARTY SM (MISCELLANEOUS) IMPLANT
KIT BASIN OR (CUSTOM PROCEDURE TRAY) ×2 IMPLANT
KIT TURNOVER KIT B (KITS) ×2 IMPLANT
NS IRRIG 1000ML POUR BTL (IV SOLUTION) ×2 IMPLANT
PACK CV ACCESS (CUSTOM PROCEDURE TRAY) ×2 IMPLANT
PAD ARMBOARD 7.5X6 YLW CONV (MISCELLANEOUS) ×4 IMPLANT
SUT MNCRL AB 4-0 PS2 18 (SUTURE) ×2 IMPLANT
SUT PROLENE 6 0 BV (SUTURE) ×2 IMPLANT
SUT VIC AB 3-0 SH 27 (SUTURE) ×1
SUT VIC AB 3-0 SH 27X BRD (SUTURE) ×1 IMPLANT
TOWEL GREEN STERILE (TOWEL DISPOSABLE) ×2 IMPLANT
UNDERPAD 30X30 (UNDERPADS AND DIAPERS) ×2 IMPLANT
WATER STERILE IRR 1000ML POUR (IV SOLUTION) ×2 IMPLANT

## 2018-10-11 NOTE — H&P (Signed)
   History and Physical Update  The patient was interviewed and re-examined.  The patient's previous History and Physical has been reviewed and is unchanged from recent office visit. Plan for left arm avf vs graft today in OR.   Cristina Wilson C. Donzetta Matters, MD [facility] [place] Office: [phone] Pager: [phone]   10/11/2018, 8:18 AM

## 2018-10-11 NOTE — Anesthesia Postprocedure Evaluation (Signed)
Anesthesia Post Note  Patient: Cristina Wilson  Procedure(s) Performed: left arm ARTERIOVENOUS (AV) FISTULA  creation (Left Arm Lower)     Patient location during evaluation: PACU Anesthesia Type: MAC Level of consciousness: awake and alert Pain management: pain level controlled Vital Signs Assessment: post-procedure vital signs reviewed and stable Respiratory status: spontaneous breathing, nonlabored ventilation, respiratory function stable and patient connected to nasal cannula oxygen Cardiovascular status: stable and blood pressure returned to baseline Postop Assessment: no apparent nausea or vomiting Anesthetic complications: no    Last Vitals:  Vitals:   10/11/18 0930 10/11/18 0945  BP: 123/76 (!) 108/59  Pulse: 69 65  Resp: (!) 21 13  Temp: 36.8 C 36.9 C  SpO2: 100% 96%    Last Pain:  Vitals:   10/11/18 0930  TempSrc:   PainSc: 0-No pain                 Aubry Rankin COKER

## 2018-10-11 NOTE — Transfer of Care (Signed)
Immediate Anesthesia Transfer of Care Note  Patient: [name]  Procedure(s) Performed: left arm ARTERIOVENOUS (AV) FISTULA  creation (Left Arm Lower)  Patient Location: PACU  Anesthesia Type:MAC  Level of Consciousness: awake, alert , oriented and sedated  Airway & Oxygen Therapy: Patient Spontanous Breathing and Patient connected to nasal cannula oxygen  Post-op Assessment: Report given to RN, Post -op Vital signs reviewed and stable and Patient moving all extremities  Post vital signs: Reviewed and stable  Last Vitals:  Vitals Value Taken Time  BP 123/76 10/11/18 0930  Temp    Pulse 69 10/11/18 0930  Resp 21 10/11/18 0930  SpO2 99 % 10/11/18 0930  Vitals shown include unvalidated device data.  Last Pain:  Vitals:   10/11/18 0641  TempSrc:   PainSc: 0-No pain      Patients Stated Pain Goal: 2 (61/95/09 3267)  Complications: No apparent anesthesia complications

## 2018-10-11 NOTE — Anesthesia Preprocedure Evaluation (Signed)
Anesthesia Evaluation  Patient identified by MRN, date of birth, ID band Patient awake    Reviewed: Allergy & Precautions, NPO status , Patient's Chart, lab work & pertinent test results  Airway Mallampati: II  TM Distance: >3 FB Neck ROM: Full    Dental  (+) Teeth Intact, Dental Advisory Given   Pulmonary former smoker,    breath sounds clear to auscultation       Cardiovascular hypertension,  Rhythm:Regular Rate:Normal     Neuro/Psych    GI/Hepatic   Endo/Other    Renal/GU      Musculoskeletal   Abdominal   Peds  Hematology   Anesthesia Other Findings   Reproductive/Obstetrics                             Anesthesia Physical Anesthesia Plan  ASA: III  Anesthesia Plan: General   Post-op Pain Management:    Induction: Intravenous  PONV Risk Score and Plan: Ondansetron and Dexamethasone  Airway Management Planned: LMA  Additional Equipment:   Intra-op Plan:   Post-operative Plan:   Informed Consent: I have reviewed the patients History and Physical, chart, labs and discussed the procedure including the risks, benefits and alternatives for the proposed anesthesia with the patient or authorized representative who has indicated his/her understanding and acceptance.   Dental advisory given  Plan Discussed with: CRNA and Anesthesiologist  Anesthesia Plan Comments:         Anesthesia Quick Evaluation  

## 2018-10-11 NOTE — Op Note (Signed)
    Patient name: Cristina Wilson MRN: [id number] DOB: January 01, 1951 Sex: female  10/11/2018 Pre-operative Diagnosis: End-stage renal disease Post-operative diagnosis:  Same Surgeon:  Erlene Quan C. Donzetta Matters, MD Assistant: Leontine Locket, PA Procedure Performed:   Left arm brachiocephalic AV fistula creation  Indications: 69 year old female with end-stage renal disease currently on dialysis via right IJ catheter.  She is right arm dominant.  She is indicated for left arm AV access.  Findings: There was a large identifiable cephalic vein by visualization.  By open examination this did branch Early was easily dilated to 4 mm.  Brachial artery itself was only approximately 2-1/2 mm.  At completion there is flow in the fistula although somewhat weak.  Her artery is very small she would not be a good candidate for a brachial artery based graft in the future if the fistula fails.  She does have a suitable basilic vein on the left arm as well.   Procedure:  The patient was identified in the holding area and taken to the operating room where MAC anesthesia was induced.  She was sterilely prepped draped left upper extremity usual fashion antibiotics were minister and timeout was called.  We identified the vein by palpation as well as the artery.  There is anesthetized 1% lidocaine with epinephrine transverse incision was made.  We dissected out the vein and divided branches between clips and ties.  We then dissected the deep fascia to the artery placed a vessel loop around this prior to the bifurcation.  The artery was encircled with vessel loop.  The vein was transected distally more for orientation spatulated and dilated to 4 mm serially.  The artery was clipped distally proximally over longitudinally flushed heparinized saline by directions.  The vein was sewn end-to-side with 6-0 Prolene suture.  Prior to completion anastomosis well flushing all directions.  1 completion there was palpable flow in the fistula  this was confirmed with Doppler.  Radial artery at the wrist did have monophasic flow did improve with compression of the fistula.  We thoroughly irrigated the wound with saline obtain hemostasis closed in layers Vicryl Monocryl.  Dermal was placed to level skin.  She was awakened anesthesia having tolerated procedure without immediate complication.  Counts were correct at completion.  EBL: 25 cc    Lyle Leisner C. Donzetta Matters, MD [facility] [place] Office: [phone] Pager: [phone]

## 2018-10-11 NOTE — Discharge Instructions (Signed)
° °  [facility]  Discharge Instructions  AV Fistula or Graft Surgery for Dialysis Access  Please refer to the following instructions for your post-procedure care. Your surgeon or physician assistant will discuss any changes with you.  Activity  You may drive the day following your surgery, if you are comfortable and no longer taking prescription pain medication. Resume full activity as the soreness in your incision resolves.  Bathing/Showering  You may shower after you go home. Keep your incision dry for 48 hours. Do not soak in a bathtub, hot tub, or swim until the incision heals completely. You may not shower if you have a hemodialysis catheter.  Incision Care  Clean your incision with mild soap and water after 48 hours. Pat the area dry with a clean towel. You do not need a bandage unless otherwise instructed. Do not apply any ointments or creams to your incision. You may have skin glue on your incision. Do not peel it off. It will come off on its own in about one week. Your arm may swell a bit after surgery. To reduce swelling use pillows to elevate your arm so it is above your heart. Your doctor will tell you if you need to lightly wrap your arm with an ACE bandage.  Diet  Resume your normal diet. There are not special food restrictions following this procedure. In order to heal from your surgery, it is CRITICAL to get adequate nutrition. Your body requires vitamins, minerals, and protein. Vegetables are the best source of vitamins and minerals. Vegetables also provide the perfect balance of protein. Processed food has little nutritional value, so try to avoid this.  Medications  Resume taking all of your medications. If your incision is causing pain, you may take over-the counter pain relievers such as acetaminophen (Tylenol). If you were prescribed a stronger pain medication, please be aware these medications can cause nausea and constipation. Prevent  nausea by taking the medication with a snack or meal. Avoid constipation by drinking plenty of fluids and eating foods with high amount of fiber, such as fruits, vegetables, and grains.  Do not take Tylenol if you are taking prescription pain medications.  Follow up Your surgeon may want to see you in the office following your access surgery. If so, this will be arranged at the time of your surgery.  Please call us immediately for any of the following conditions:  Increased pain, redness, drainage (pus) from your incision site Fever of 101 degrees or higher Severe or worsening pain at your incision site Hand pain or numbness.  Reduce your risk of vascular disease:  Stop smoking. If you would like help, call [facility] at 1-800-QUIT-NOW (678)665-0078) or [place] at [place] your cholesterol Maintain a desired weight Control your diabetes Keep your blood pressure down  Dialysis  It will take several weeks to several months for your new dialysis access to be ready for use. Your surgeon will determine when it is okay to use it. Your nephrologist will continue to direct your dialysis. You can continue to use your Permcath until your new access is ready for use.   10/11/2018 Cristina Wilson [id number] [birth year]  Surgeon(s): Waynetta Sandy, MD  Procedure(s): Creation of left brachiocephalic AV fistula  x Do not stick fistula for 12 weeks    If you have any questions, please call the office at [phone].

## 2018-10-12 ENCOUNTER — Encounter (HOSPITAL_COMMUNITY): Payer: Self-pay | Admitting: Vascular Surgery

## 2018-10-16 ENCOUNTER — Ambulatory Visit
Admission: RE | Admit: 2018-10-16 | Discharge: 2018-10-16 | Disposition: A | Discharge status: Home / Self Care | Payer: Medicare HMO | Source: Ambulatory Visit | Attending: Geriatric Medicine | Admitting: Geriatric Medicine

## 2018-10-16 DIAGNOSIS — K219 Gastro-esophageal reflux disease without esophagitis: Secondary | ICD-10-CM | POA: Diagnosis not present

## 2018-10-16 DIAGNOSIS — R1313 Dysphagia, pharyngeal phase: Secondary | ICD-10-CM

## 2018-10-16 DIAGNOSIS — K449 Diaphragmatic hernia without obstruction or gangrene: Secondary | ICD-10-CM | POA: Diagnosis not present

## 2018-10-23 ENCOUNTER — Encounter: Payer: Self-pay | Admitting: Podiatry

## 2018-10-23 ENCOUNTER — Ambulatory Visit: Payer: Medicare HMO | Admitting: Podiatry

## 2018-10-23 ENCOUNTER — Other Ambulatory Visit: Payer: Self-pay

## 2018-10-23 DIAGNOSIS — L03032 Cellulitis of left toe: Secondary | ICD-10-CM | POA: Diagnosis not present

## 2018-10-23 MED ORDER — CEPHALEXIN 500 MG PO CAPS: 500.0000 mg | ORAL_CAPSULE | Freq: Three times a day (TID) | ORAL | 1 refills | Status: DC

## 2018-10-23 NOTE — Progress Notes (Signed)
Subjective:   Patient ID: Cristina Wilson, female   DOB: 68 y.o.   MRN: [id number]   HPI Patient presents stating the side of his big toe has been bothering me and I feel like there is been some drainage and I noted some pus but it is not there currently   ROS      Objective:  Physical Exam  Neurovascular status intact with patient's left hallux healed well from ingrown toenail but there is some proximal erythema at the base which may indicate some, localized abscess infective process     Assessment:  Possibility of a localized paronychia infection left hallux     Plan:  Infiltrated 60 mg like Marcaine mixture sterile prep applied and using sterile instrumentation I remove the medial border and clean the tissue out with no active drainage that I could culture at the current time.  As precautionary measure placed on cephalexin 500 mg 3 times daily and sterile dressing was applied to the toe with instructions for soaking within 24 hours

## 2018-10-23 NOTE — Patient Instructions (Signed)

## 2018-10-24 DIAGNOSIS — D688 Other specified coagulation defects: Secondary | ICD-10-CM | POA: Diagnosis not present

## 2018-10-24 DIAGNOSIS — Z992 Dependence on renal dialysis: Secondary | ICD-10-CM | POA: Diagnosis not present

## 2018-10-24 DIAGNOSIS — D509 Iron deficiency anemia, unspecified: Secondary | ICD-10-CM | POA: Diagnosis not present

## 2018-10-24 DIAGNOSIS — N186 End stage renal disease: Secondary | ICD-10-CM | POA: Diagnosis not present

## 2018-10-24 DIAGNOSIS — D631 Anemia in chronic kidney disease: Secondary | ICD-10-CM | POA: Diagnosis not present

## 2018-10-24 DIAGNOSIS — N2581 Secondary hyperparathyroidism of renal origin: Secondary | ICD-10-CM | POA: Diagnosis not present

## 2018-11-03 DIAGNOSIS — G510 Bell's palsy: Secondary | ICD-10-CM | POA: Diagnosis not present

## 2018-11-03 DIAGNOSIS — H401112 Primary open-angle glaucoma, right eye, moderate stage: Secondary | ICD-10-CM | POA: Diagnosis not present

## 2018-11-03 DIAGNOSIS — H2513 Age-related nuclear cataract, bilateral: Secondary | ICD-10-CM | POA: Diagnosis not present

## 2018-11-03 DIAGNOSIS — H401121 Primary open-angle glaucoma, left eye, mild stage: Secondary | ICD-10-CM | POA: Diagnosis not present

## 2018-11-04 DIAGNOSIS — N186 End stage renal disease: Secondary | ICD-10-CM | POA: Diagnosis not present

## 2018-11-04 DIAGNOSIS — N019 Rapidly progressive nephritic syndrome with unspecified morphologic changes: Secondary | ICD-10-CM | POA: Diagnosis not present

## 2018-11-04 DIAGNOSIS — Z992 Dependence on renal dialysis: Secondary | ICD-10-CM | POA: Diagnosis not present

## 2018-11-07 DIAGNOSIS — N2581 Secondary hyperparathyroidism of renal origin: Secondary | ICD-10-CM | POA: Diagnosis not present

## 2018-11-07 DIAGNOSIS — N186 End stage renal disease: Secondary | ICD-10-CM | POA: Diagnosis not present

## 2018-11-07 DIAGNOSIS — D631 Anemia in chronic kidney disease: Secondary | ICD-10-CM | POA: Diagnosis not present

## 2018-11-07 DIAGNOSIS — Z992 Dependence on renal dialysis: Secondary | ICD-10-CM | POA: Diagnosis not present

## 2018-11-07 DIAGNOSIS — D509 Iron deficiency anemia, unspecified: Secondary | ICD-10-CM | POA: Diagnosis not present

## 2018-11-07 DIAGNOSIS — D688 Other specified coagulation defects: Secondary | ICD-10-CM | POA: Diagnosis not present

## 2018-11-07 DIAGNOSIS — M310 Hypersensitivity angiitis: Secondary | ICD-10-CM | POA: Diagnosis not present

## 2018-11-15 DIAGNOSIS — I700 Atherosclerosis of aorta: Secondary | ICD-10-CM | POA: Diagnosis not present

## 2018-11-15 DIAGNOSIS — E039 Hypothyroidism, unspecified: Secondary | ICD-10-CM | POA: Diagnosis not present

## 2018-11-15 DIAGNOSIS — R69 Illness, unspecified: Secondary | ICD-10-CM | POA: Diagnosis not present

## 2018-11-15 DIAGNOSIS — E7800 Pure hypercholesterolemia, unspecified: Secondary | ICD-10-CM | POA: Diagnosis not present

## 2018-11-15 DIAGNOSIS — H8113 Benign paroxysmal vertigo, bilateral: Secondary | ICD-10-CM | POA: Diagnosis not present

## 2018-11-15 DIAGNOSIS — N186 End stage renal disease: Secondary | ICD-10-CM | POA: Diagnosis not present

## 2018-11-15 DIAGNOSIS — I10 Essential (primary) hypertension: Secondary | ICD-10-CM | POA: Diagnosis not present

## 2018-11-15 DIAGNOSIS — Z0000 Encounter for general adult medical examination without abnormal findings: Secondary | ICD-10-CM | POA: Diagnosis not present

## 2018-11-15 DIAGNOSIS — I129 Hypertensive chronic kidney disease with stage 1 through stage 4 chronic kidney disease, or unspecified chronic kidney disease: Secondary | ICD-10-CM | POA: Diagnosis not present

## 2018-11-15 DIAGNOSIS — Z79899 Other long term (current) drug therapy: Secondary | ICD-10-CM | POA: Diagnosis not present

## 2018-11-21 ENCOUNTER — Other Ambulatory Visit: Payer: Self-pay

## 2018-11-21 DIAGNOSIS — N186 End stage renal disease: Secondary | ICD-10-CM

## 2018-11-24 ENCOUNTER — Other Ambulatory Visit: Payer: Self-pay

## 2018-11-24 ENCOUNTER — Ambulatory Visit (HOSPITAL_COMMUNITY)
Admission: RE | Admit: 2018-11-24 | Discharge: 2018-11-24 | Disposition: A | Discharge status: Home / Self Care | Payer: Medicare HMO | Source: Ambulatory Visit | Attending: Vascular Surgery | Admitting: Vascular Surgery

## 2018-11-24 ENCOUNTER — Ambulatory Visit (INDEPENDENT_AMBULATORY_CARE_PROVIDER_SITE_OTHER): Payer: Medicare HMO | Admitting: Physician Assistant

## 2018-11-24 VITALS — BP 156/55 | HR 58 | Temp 97.9°F | Resp 14 | Ht 64.0 in | Wt 145.0 lb

## 2018-11-24 DIAGNOSIS — N186 End stage renal disease: Secondary | ICD-10-CM

## 2018-11-24 NOTE — Progress Notes (Signed)
  POST OPERATIVE OFFICE NOTE    CC:  F/u for surgery  HPI:  This is a 68 y.o. female who is s/p Left arm brachiocephalic AV fistula creation 10/11/18 by Dr. Donzetta Matters.  She denise pain, loss of sensation or loss of motor.  Allergies  Allergen Reactions  . Sulfa Antibiotics Rash    Current Outpatient Medications  Medication Sig Dispense Refill  . acetaminophen (TYLENOL) 500 MG tablet Take 500-1,000 mg by mouth every 6 (six) hours as needed for mild pain or headache.    . B Complex-C-Folic Acid (RENAL-VITE) 0.8 MG TABS Take 1 tablet by mouth daily.     . bimatoprost (LUMIGAN) 0.03 % ophthalmic solution Place 1 drop into both eyes at bedtime.    . cephALEXin (KEFLEX) 500 MG capsule Take 1 capsule (500 mg total) by mouth 3 (three) times daily. 21 capsule 1  . cyclophosphamide (CYTOXAN) 25 MG capsule Take 25 mg by mouth daily.     . dapsone 100 MG tablet Take 100 mg by mouth daily.    Marland Kitchen levothyroxine (SYNTHROID, LEVOTHROID) 125 MCG tablet Take 125 mcg by mouth daily before breakfast.     . Multiple Vitamin (MULTIVITAMIN PO) Take 1 tablet by mouth daily. Venal multivitamin    . pregabalin (LYRICA) 25 MG capsule Take 50 mg by mouth 2 (two) times daily.     . sevelamer carbonate (RENVELA) 800 MG tablet Take 1,600 mg by mouth 3 (three) times daily with meals.     . traZODone (DESYREL) 50 MG tablet Take 50 mg by mouth at bedtime.      No current facility-administered medications for this visit.      ROS:  See HPI  Physical Exam:  +------------+----------+-------------+----------+--------+ OUTFLOW VEINPSV (cm/s)Diameter (cm)Depth (cm)Describe +------------+----------+-------------+----------+--------+ Shoulder       146        0.63        0.76            +------------+----------+-------------+----------+--------+ Prox UA        168        0.51        0.54            +------------+----------+-------------+----------+--------+ Mid UA         133        0.71        0.35             +------------+----------+-------------+----------+--------+ Dist UA        131        0.65        0.24            +------------+----------+-------------+----------+--------+ AC Fossa       346        0.63        0.21            +------------+----------+-------------+----------+--------+   Incision:  Well healed left UE incision Extremities:  Palpable thrill in fistula and distal radial pulse.  Grip 5/5.   Assessment/Plan:  This is a 68 y.o. female who is s/p:Left arm brachiocephalic AV fistula creation Well maturing Left UE AV fistula created 10/11/2018.  The fistula may be accessed as of Jan 07/2019.  F/U PRN.  Once the fistula is successfully accessed the right [facility] may be removed.       Silverio Decamp, PA-C [facility] [phone]  Clinic MD:  Donzetta Matters

## 2018-11-29 ENCOUNTER — Ambulatory Visit: Payer: Medicare HMO | Admitting: Physical Therapy

## 2018-12-04 DIAGNOSIS — N186 End stage renal disease: Secondary | ICD-10-CM | POA: Diagnosis not present

## 2018-12-04 DIAGNOSIS — Z992 Dependence on renal dialysis: Secondary | ICD-10-CM | POA: Diagnosis not present

## 2018-12-04 DIAGNOSIS — N019 Rapidly progressive nephritic syndrome with unspecified morphologic changes: Secondary | ICD-10-CM | POA: Diagnosis not present

## 2018-12-05 DIAGNOSIS — D631 Anemia in chronic kidney disease: Secondary | ICD-10-CM | POA: Diagnosis not present

## 2018-12-05 DIAGNOSIS — N2581 Secondary hyperparathyroidism of renal origin: Secondary | ICD-10-CM | POA: Diagnosis not present

## 2018-12-05 DIAGNOSIS — N186 End stage renal disease: Secondary | ICD-10-CM | POA: Diagnosis not present

## 2018-12-05 DIAGNOSIS — Z992 Dependence on renal dialysis: Secondary | ICD-10-CM | POA: Diagnosis not present

## 2018-12-05 DIAGNOSIS — D509 Iron deficiency anemia, unspecified: Secondary | ICD-10-CM | POA: Diagnosis not present

## 2018-12-05 DIAGNOSIS — M310 Hypersensitivity angiitis: Secondary | ICD-10-CM | POA: Diagnosis not present

## 2018-12-05 DIAGNOSIS — D688 Other specified coagulation defects: Secondary | ICD-10-CM | POA: Diagnosis not present

## 2018-12-21 DIAGNOSIS — L820 Inflamed seborrheic keratosis: Secondary | ICD-10-CM | POA: Diagnosis not present

## 2018-12-21 DIAGNOSIS — Z23 Encounter for immunization: Secondary | ICD-10-CM | POA: Diagnosis not present

## 2018-12-21 DIAGNOSIS — L309 Dermatitis, unspecified: Secondary | ICD-10-CM | POA: Diagnosis not present

## 2018-12-21 DIAGNOSIS — D485 Neoplasm of uncertain behavior of skin: Secondary | ICD-10-CM | POA: Diagnosis not present

## 2018-12-23 DIAGNOSIS — N2581 Secondary hyperparathyroidism of renal origin: Secondary | ICD-10-CM | POA: Diagnosis not present

## 2018-12-23 DIAGNOSIS — Z992 Dependence on renal dialysis: Secondary | ICD-10-CM | POA: Diagnosis not present

## 2018-12-23 DIAGNOSIS — D509 Iron deficiency anemia, unspecified: Secondary | ICD-10-CM | POA: Diagnosis not present

## 2018-12-23 DIAGNOSIS — D688 Other specified coagulation defects: Secondary | ICD-10-CM | POA: Diagnosis not present

## 2018-12-23 DIAGNOSIS — N186 End stage renal disease: Secondary | ICD-10-CM | POA: Diagnosis not present

## 2018-12-23 DIAGNOSIS — D631 Anemia in chronic kidney disease: Secondary | ICD-10-CM | POA: Diagnosis not present

## 2018-12-23 DIAGNOSIS — M310 Hypersensitivity angiitis: Secondary | ICD-10-CM | POA: Diagnosis not present

## 2019-01-04 DIAGNOSIS — Z992 Dependence on renal dialysis: Secondary | ICD-10-CM | POA: Diagnosis not present

## 2019-01-04 DIAGNOSIS — N186 End stage renal disease: Secondary | ICD-10-CM | POA: Diagnosis not present

## 2019-01-04 DIAGNOSIS — N019 Rapidly progressive nephritic syndrome with unspecified morphologic changes: Secondary | ICD-10-CM | POA: Diagnosis not present

## 2019-01-07 DIAGNOSIS — N2581 Secondary hyperparathyroidism of renal origin: Secondary | ICD-10-CM | POA: Diagnosis not present

## 2019-01-07 DIAGNOSIS — D631 Anemia in chronic kidney disease: Secondary | ICD-10-CM | POA: Diagnosis not present

## 2019-01-07 DIAGNOSIS — D688 Other specified coagulation defects: Secondary | ICD-10-CM | POA: Diagnosis not present

## 2019-01-07 DIAGNOSIS — D509 Iron deficiency anemia, unspecified: Secondary | ICD-10-CM | POA: Diagnosis not present

## 2019-01-07 DIAGNOSIS — M310 Hypersensitivity angiitis: Secondary | ICD-10-CM | POA: Diagnosis not present

## 2019-01-07 DIAGNOSIS — E7849 Other hyperlipidemia: Secondary | ICD-10-CM | POA: Diagnosis not present

## 2019-01-07 DIAGNOSIS — N186 End stage renal disease: Secondary | ICD-10-CM | POA: Diagnosis not present

## 2019-01-07 DIAGNOSIS — Z992 Dependence on renal dialysis: Secondary | ICD-10-CM | POA: Diagnosis not present

## 2019-01-22 ENCOUNTER — Ambulatory Visit: Payer: Self-pay

## 2019-01-22 DIAGNOSIS — Z1231 Encounter for screening mammogram for malignant neoplasm of breast: Secondary | ICD-10-CM | POA: Diagnosis not present

## 2019-01-22 NOTE — Telephone Encounter (Signed)
Left  Message for a return call back.

## 2019-01-23 DIAGNOSIS — N2581 Secondary hyperparathyroidism of renal origin: Secondary | ICD-10-CM | POA: Diagnosis not present

## 2019-01-23 DIAGNOSIS — D509 Iron deficiency anemia, unspecified: Secondary | ICD-10-CM | POA: Diagnosis not present

## 2019-01-23 DIAGNOSIS — M310 Hypersensitivity angiitis: Secondary | ICD-10-CM | POA: Diagnosis not present

## 2019-01-23 DIAGNOSIS — Z992 Dependence on renal dialysis: Secondary | ICD-10-CM | POA: Diagnosis not present

## 2019-01-23 DIAGNOSIS — N186 End stage renal disease: Secondary | ICD-10-CM | POA: Diagnosis not present

## 2019-01-23 DIAGNOSIS — D688 Other specified coagulation defects: Secondary | ICD-10-CM | POA: Diagnosis not present

## 2019-01-23 DIAGNOSIS — E7849 Other hyperlipidemia: Secondary | ICD-10-CM | POA: Diagnosis not present

## 2019-01-23 DIAGNOSIS — D631 Anemia in chronic kidney disease: Secondary | ICD-10-CM | POA: Diagnosis not present

## 2019-01-24 ENCOUNTER — Ambulatory Visit: Payer: Medicare HMO | Attending: Internal Medicine

## 2019-01-24 DIAGNOSIS — Z23 Encounter for immunization: Secondary | ICD-10-CM | POA: Insufficient documentation

## 2019-01-30 DIAGNOSIS — R922 Inconclusive mammogram: Secondary | ICD-10-CM | POA: Diagnosis not present

## 2019-02-02 DIAGNOSIS — M5416 Radiculopathy, lumbar region: Secondary | ICD-10-CM | POA: Diagnosis not present

## 2019-02-02 DIAGNOSIS — M533 Sacrococcygeal disorders, not elsewhere classified: Secondary | ICD-10-CM | POA: Diagnosis not present

## 2019-02-02 DIAGNOSIS — R030 Elevated blood-pressure reading, without diagnosis of hypertension: Secondary | ICD-10-CM | POA: Diagnosis not present

## 2019-02-04 DIAGNOSIS — N019 Rapidly progressive nephritic syndrome with unspecified morphologic changes: Secondary | ICD-10-CM | POA: Diagnosis not present

## 2019-02-04 DIAGNOSIS — Z992 Dependence on renal dialysis: Secondary | ICD-10-CM | POA: Diagnosis not present

## 2019-02-04 DIAGNOSIS — N186 End stage renal disease: Secondary | ICD-10-CM | POA: Diagnosis not present

## 2019-02-06 DIAGNOSIS — D688 Other specified coagulation defects: Secondary | ICD-10-CM | POA: Diagnosis not present

## 2019-02-06 DIAGNOSIS — Z992 Dependence on renal dialysis: Secondary | ICD-10-CM | POA: Diagnosis not present

## 2019-02-06 DIAGNOSIS — D631 Anemia in chronic kidney disease: Secondary | ICD-10-CM | POA: Diagnosis not present

## 2019-02-06 DIAGNOSIS — N186 End stage renal disease: Secondary | ICD-10-CM | POA: Diagnosis not present

## 2019-02-06 DIAGNOSIS — N2581 Secondary hyperparathyroidism of renal origin: Secondary | ICD-10-CM | POA: Diagnosis not present

## 2019-02-06 DIAGNOSIS — M310 Hypersensitivity angiitis: Secondary | ICD-10-CM | POA: Diagnosis not present

## 2019-02-06 DIAGNOSIS — D509 Iron deficiency anemia, unspecified: Secondary | ICD-10-CM | POA: Diagnosis not present

## 2019-02-09 DIAGNOSIS — T82898A Other specified complication of vascular prosthetic devices, implants and grafts, initial encounter: Secondary | ICD-10-CM | POA: Diagnosis not present

## 2019-02-09 DIAGNOSIS — Z992 Dependence on renal dialysis: Secondary | ICD-10-CM | POA: Diagnosis not present

## 2019-02-09 DIAGNOSIS — N186 End stage renal disease: Secondary | ICD-10-CM | POA: Diagnosis not present

## 2019-02-14 ENCOUNTER — Ambulatory Visit: Payer: Medicare HMO | Attending: Internal Medicine

## 2019-02-14 DIAGNOSIS — R69 Illness, unspecified: Secondary | ICD-10-CM | POA: Diagnosis not present

## 2019-02-14 DIAGNOSIS — Z23 Encounter for immunization: Secondary | ICD-10-CM | POA: Insufficient documentation

## 2019-02-14 NOTE — Progress Notes (Signed)
   Covid-19 Vaccination Clinic  Name:  Cristina Wilson    MRN: [id number] DOB: 08-05-1950  02/14/2019  Ms. Cristina Wilson was observed post Covid-19 immunization for 15 minutes without incidence. She was provided with Vaccine Information Sheet and instruction to access the V-Safe system.   Ms. Cristina Wilson was instructed to call 911 with any severe reactions post vaccine: Marland Kitchen Difficulty breathing  . Swelling of your face and throat  . A fast heartbeat  . A bad rash all over your body  . Dizziness and weakness    Immunizations Administered    Name Date Dose VIS Date Route   Pfizer COVID-19 Vaccine 02/14/2019 10:29 AM 0.3 mL 12/15/2018 Intramuscular   Manufacturer: [place]   Lot: [id number]   [place]: [address]-3

## 2019-02-20 ENCOUNTER — Emergency Department (HOSPITAL_COMMUNITY): Payer: Medicare HMO

## 2019-02-20 ENCOUNTER — Other Ambulatory Visit: Payer: Self-pay

## 2019-02-20 ENCOUNTER — Observation Stay (HOSPITAL_COMMUNITY)
Admission: EM | Admit: 2019-02-20 | Discharge: 2019-02-21 | Disposition: A | Discharge status: Home / Self Care | Payer: Medicare HMO | Attending: Internal Medicine | Admitting: Internal Medicine

## 2019-02-20 ENCOUNTER — Encounter (HOSPITAL_COMMUNITY): Payer: Self-pay | Admitting: Emergency Medicine

## 2019-02-20 DIAGNOSIS — D631 Anemia in chronic kidney disease: Secondary | ICD-10-CM | POA: Diagnosis not present

## 2019-02-20 DIAGNOSIS — M5136 Other intervertebral disc degeneration, lumbar region: Secondary | ICD-10-CM | POA: Insufficient documentation

## 2019-02-20 DIAGNOSIS — Z20822 Contact with and (suspected) exposure to covid-19: Secondary | ICD-10-CM | POA: Diagnosis not present

## 2019-02-20 DIAGNOSIS — M1990 Unspecified osteoarthritis, unspecified site: Secondary | ICD-10-CM | POA: Insufficient documentation

## 2019-02-20 DIAGNOSIS — M310 Hypersensitivity angiitis: Secondary | ICD-10-CM | POA: Diagnosis not present

## 2019-02-20 DIAGNOSIS — E039 Hypothyroidism, unspecified: Secondary | ICD-10-CM | POA: Diagnosis not present

## 2019-02-20 DIAGNOSIS — I10 Essential (primary) hypertension: Secondary | ICD-10-CM | POA: Diagnosis present

## 2019-02-20 DIAGNOSIS — N186 End stage renal disease: Secondary | ICD-10-CM | POA: Diagnosis not present

## 2019-02-20 DIAGNOSIS — I252 Old myocardial infarction: Secondary | ICD-10-CM | POA: Diagnosis not present

## 2019-02-20 DIAGNOSIS — J810 Acute pulmonary edema: Secondary | ICD-10-CM | POA: Diagnosis not present

## 2019-02-20 DIAGNOSIS — R0902 Hypoxemia: Secondary | ICD-10-CM | POA: Diagnosis not present

## 2019-02-20 DIAGNOSIS — I1311 Hypertensive heart and chronic kidney disease without heart failure, with stage 5 chronic kidney disease, or end stage renal disease: Principal | ICD-10-CM | POA: Insufficient documentation

## 2019-02-20 DIAGNOSIS — J811 Chronic pulmonary edema: Secondary | ICD-10-CM | POA: Diagnosis present

## 2019-02-20 DIAGNOSIS — M545 Low back pain: Secondary | ICD-10-CM | POA: Insufficient documentation

## 2019-02-20 DIAGNOSIS — Z79899 Other long term (current) drug therapy: Secondary | ICD-10-CM | POA: Diagnosis not present

## 2019-02-20 DIAGNOSIS — F419 Anxiety disorder, unspecified: Secondary | ICD-10-CM | POA: Diagnosis not present

## 2019-02-20 DIAGNOSIS — K219 Gastro-esophageal reflux disease without esophagitis: Secondary | ICD-10-CM | POA: Diagnosis not present

## 2019-02-20 DIAGNOSIS — R253 Fasciculation: Secondary | ICD-10-CM | POA: Diagnosis not present

## 2019-02-20 DIAGNOSIS — E785 Hyperlipidemia, unspecified: Secondary | ICD-10-CM | POA: Diagnosis not present

## 2019-02-20 DIAGNOSIS — J9600 Acute respiratory failure, unspecified whether with hypoxia or hypercapnia: Secondary | ICD-10-CM | POA: Diagnosis not present

## 2019-02-20 DIAGNOSIS — R0602 Shortness of breath: Secondary | ICD-10-CM | POA: Insufficient documentation

## 2019-02-20 DIAGNOSIS — G894 Chronic pain syndrome: Secondary | ICD-10-CM | POA: Insufficient documentation

## 2019-02-20 DIAGNOSIS — Z87891 Personal history of nicotine dependence: Secondary | ICD-10-CM | POA: Insufficient documentation

## 2019-02-20 DIAGNOSIS — N2581 Secondary hyperparathyroidism of renal origin: Secondary | ICD-10-CM | POA: Diagnosis not present

## 2019-02-20 DIAGNOSIS — G8929 Other chronic pain: Secondary | ICD-10-CM | POA: Diagnosis present

## 2019-02-20 DIAGNOSIS — R05 Cough: Secondary | ICD-10-CM | POA: Diagnosis not present

## 2019-02-20 DIAGNOSIS — Z992 Dependence on renal dialysis: Secondary | ICD-10-CM | POA: Diagnosis not present

## 2019-02-20 DIAGNOSIS — Z79890 Hormone replacement therapy (postmenopausal): Secondary | ICD-10-CM | POA: Insufficient documentation

## 2019-02-20 HISTORY — DX: Chronic pulmonary edema: J81.1

## 2019-02-20 LAB — CBC WITH DIFFERENTIAL/PLATELET
Abs Immature Granulocytes: 0.01 10*3/uL (ref 0.00–0.07)
Basophils Absolute: 0.1 10*3/uL (ref 0.0–0.1)
Basophils Relative: 1 %
Eosinophils Absolute: 0.2 10*3/uL (ref 0.0–0.5)
Eosinophils Relative: 3 %
HCT: 30.9 % — ABNORMAL LOW (ref 36.0–46.0)
Hemoglobin: 9.7 g/dL — ABNORMAL LOW (ref 12.0–15.0)
Immature Granulocytes: 0 %
Lymphocytes Relative: 12 %
Lymphs Abs: 0.9 10*3/uL (ref 0.7–4.0)
MCH: 31.5 pg (ref 26.0–34.0)
MCHC: 31.4 g/dL (ref 30.0–36.0)
MCV: 100.3 fL — ABNORMAL HIGH (ref 80.0–100.0)
Monocytes Absolute: 0.6 10*3/uL (ref 0.1–1.0)
Monocytes Relative: 8 %
Neutro Abs: 5.5 10*3/uL (ref 1.7–7.7)
Neutrophils Relative %: 76 %
Platelets: 145 10*3/uL — ABNORMAL LOW (ref 150–400)
RBC: 3.08 MIL/uL — ABNORMAL LOW (ref 3.87–5.11)
RDW: 15.9 % — ABNORMAL HIGH (ref 11.5–15.5)
WBC: 7.2 10*3/uL (ref 4.0–10.5)
nRBC: 0 % (ref 0.0–0.2)

## 2019-02-20 LAB — BASIC METABOLIC PANEL
Anion gap: 16 — ABNORMAL HIGH (ref 5–15)
BUN: 66 mg/dL — ABNORMAL HIGH (ref 8–23)
CO2: 27 mmol/L (ref 22–32)
Calcium: 8.3 mg/dL — ABNORMAL LOW (ref 8.9–10.3)
Chloride: 99 mmol/L (ref 98–111)
Creatinine, Ser: 9.44 mg/dL — ABNORMAL HIGH (ref 0.44–1.00)
GFR calc Af Amer: 4 mL/min — ABNORMAL LOW (ref >60)
GFR calc non Af Amer: 4 mL/min — ABNORMAL LOW (ref >60)
Glucose, Bld: 120 mg/dL — ABNORMAL HIGH (ref 70–99)
Potassium: 3.4 mmol/L — ABNORMAL LOW (ref 3.5–5.1)
Sodium: 142 mmol/L (ref 135–145)

## 2019-02-20 LAB — BRAIN NATRIURETIC PEPTIDE: B Natriuretic Peptide: 1154.7 pg/mL — ABNORMAL HIGH (ref 0.0–100.0)

## 2019-02-20 LAB — RESPIRATORY PANEL BY RT PCR (FLU A&B, COVID)
Influenza A by PCR: NEGATIVE
Influenza B by PCR: NEGATIVE
SARS Coronavirus 2 by RT PCR: NEGATIVE

## 2019-02-20 LAB — POC SARS CORONAVIRUS 2 AG -  ED: SARS Coronavirus 2 Ag: NEGATIVE

## 2019-02-20 LAB — HIV ANTIBODY (ROUTINE TESTING W REFLEX): HIV Screen 4th Generation wRfx: NONREACTIVE

## 2019-02-20 LAB — TSH: TSH: 3.291 u[IU]/mL (ref 0.350–4.500)

## 2019-02-20 LAB — TROPONIN I (HIGH SENSITIVITY): Troponin I (High Sensitivity): 48 ng/L — ABNORMAL HIGH (ref <18)

## 2019-02-20 MED ORDER — ACETAMINOPHEN 500 MG PO TABS: 500.0000 mg | ORAL_TABLET | Freq: Four times a day (QID) | ORAL | Status: DC | PRN

## 2019-02-20 MED ORDER — LEVOTHYROXINE SODIUM 25 MCG PO TABS
125.0000 ug | ORAL_TABLET | Freq: Every day | ORAL | Status: DC
  Administered 2019-02-20 – 2019-02-21 (×2): 125 ug via ORAL
  Filled 2019-02-20 (×3): qty 1

## 2019-02-20 MED ORDER — SODIUM CHLORIDE 0.9 % IV SOLN: 100.0000 mL | INTRAVENOUS | Status: DC | PRN

## 2019-02-20 MED ORDER — SODIUM CHLORIDE 0.9 % IV SOLN
1.0000 g | Freq: Once | INTRAVENOUS | Status: AC
  Administered 2019-02-20: 1 g via INTRAVENOUS
  Filled 2019-02-20: qty 10

## 2019-02-20 MED ORDER — HEPARIN SODIUM (PORCINE) 1000 UNIT/ML IJ SOLN
INTRAMUSCULAR | Status: AC
  Filled 2019-02-20: qty 4

## 2019-02-20 MED ORDER — DAPSONE 100 MG PO TABS
100.0000 mg | ORAL_TABLET | Freq: Every day | ORAL | Status: DC
  Administered 2019-02-20 – 2019-02-21 (×2): 100 mg via ORAL
  Filled 2019-02-20 (×2): qty 1

## 2019-02-20 MED ORDER — SEVELAMER CARBONATE 800 MG PO TABS
1600.0000 mg | ORAL_TABLET | Freq: Three times a day (TID) | ORAL | Status: DC
  Administered 2019-02-20 – 2019-02-21 (×3): 1600 mg via ORAL
  Filled 2019-02-20 (×5): qty 2

## 2019-02-20 MED ORDER — HEPARIN SODIUM (PORCINE) 1000 UNIT/ML DIALYSIS
1000.0000 [IU] | INTRAMUSCULAR | Status: DC | PRN
  Filled 2019-02-20: qty 1

## 2019-02-20 MED ORDER — SODIUM CHLORIDE 0.9% FLUSH: 3.0000 mL | INTRAVENOUS | Status: DC | PRN

## 2019-02-20 MED ORDER — HEPARIN SODIUM (PORCINE) 5000 UNIT/ML IJ SOLN
5000.0000 [IU] | Freq: Three times a day (TID) | INTRAMUSCULAR | Status: DC
  Administered 2019-02-20 – 2019-02-21 (×2): 5000 [IU] via SUBCUTANEOUS
  Filled 2019-02-20 (×2): qty 1

## 2019-02-20 MED ORDER — PENTAFLUOROPROP-TETRAFLUOROETH EX AERO
1.0000 "application " | INHALATION_SPRAY | CUTANEOUS | Status: DC | PRN
  Filled 2019-02-20: qty 116

## 2019-02-20 MED ORDER — ACETAMINOPHEN 325 MG PO TABS
650.0000 mg | ORAL_TABLET | Freq: Once | ORAL | Status: AC
  Administered 2019-02-20: 650 mg via ORAL
  Filled 2019-02-20: qty 2

## 2019-02-20 MED ORDER — CALCITRIOL 0.5 MCG PO CAPS
0.7500 ug | ORAL_CAPSULE | ORAL | Status: DC
  Filled 2019-02-20: qty 1

## 2019-02-20 MED ORDER — CYCLOPHOSPHAMIDE 25 MG PO CAPS
25.0000 mg | ORAL_CAPSULE | Freq: Every day | ORAL | Status: DC
  Administered 2019-02-21: 25 mg via ORAL
  Filled 2019-02-20 (×3): qty 1

## 2019-02-20 MED ORDER — PREGABALIN 25 MG PO CAPS: 50.0000 mg | ORAL_CAPSULE | Freq: Every day | ORAL | Status: DC

## 2019-02-20 MED ORDER — SODIUM CHLORIDE 0.9% FLUSH
3.0000 mL | Freq: Two times a day (BID) | INTRAVENOUS | Status: DC
  Administered 2019-02-20: 3 mL via INTRAVENOUS

## 2019-02-20 MED ORDER — LIDOCAINE HCL (PF) 1 % IJ SOLN: 5.0000 mL | INTRAMUSCULAR | Status: DC | PRN

## 2019-02-20 MED ORDER — RENA-VITE PO TABS
1.0000 | ORAL_TABLET | Freq: Every day | ORAL | Status: DC
  Administered 2019-02-20: 1 via ORAL
  Filled 2019-02-20 (×2): qty 1

## 2019-02-20 MED ORDER — ALTEPLASE 2 MG IJ SOLR: 2.0000 mg | Freq: Once | INTRAMUSCULAR | Status: DC | PRN

## 2019-02-20 MED ORDER — SODIUM CHLORIDE 0.9 % IV SOLN: 250.0000 mL | INTRAVENOUS | Status: DC | PRN

## 2019-02-20 MED ORDER — PREGABALIN 25 MG PO CAPS
50.0000 mg | ORAL_CAPSULE | Freq: Two times a day (BID) | ORAL | Status: DC
  Administered 2019-02-20: 50 mg via ORAL
  Filled 2019-02-20: qty 2

## 2019-02-20 MED ORDER — HEPARIN SODIUM (PORCINE) 1000 UNIT/ML DIALYSIS
2000.0000 [IU] | INTRAMUSCULAR | Status: DC | PRN
  Filled 2019-02-20: qty 2

## 2019-02-20 MED ORDER — LIDOCAINE-PRILOCAINE 2.5-2.5 % EX CREA
1.0000 "application " | TOPICAL_CREAM | CUTANEOUS | Status: DC | PRN
  Filled 2019-02-20: qty 5

## 2019-02-20 MED ORDER — TRAZODONE HCL 50 MG PO TABS
50.0000 mg | ORAL_TABLET | Freq: Every day | ORAL | Status: DC
  Administered 2019-02-20: 50 mg via ORAL
  Filled 2019-02-20: qty 1

## 2019-02-20 MED ORDER — CHLORHEXIDINE GLUCONATE CLOTH 2 % EX PADS
6.0000 | MEDICATED_PAD | Freq: Every day | CUTANEOUS | Status: DC
  Administered 2019-02-21: 6 via TOPICAL

## 2019-02-20 MED ORDER — SODIUM CHLORIDE 0.9 % IV SOLN
500.0000 mg | Freq: Once | INTRAVENOUS | Status: AC
  Administered 2019-02-20: 500 mg via INTRAVENOUS
  Filled 2019-02-20: qty 500

## 2019-02-20 NOTE — ED Notes (Signed)
Lunch Tray Ordered @ 1056. 

## 2019-02-20 NOTE — ED Triage Notes (Addendum)
Patient arrived with EMS from home reports SOB with chest congestion and dry cough onset this evening , denies fever or chills . Hypertensive at arrival BP=210/101 , hemodialysis q Tues/Thurs/Sat.

## 2019-02-20 NOTE — ED Notes (Signed)
Nephrology at bedside

## 2019-02-20 NOTE — Progress Notes (Signed)
AVF accessed with 17g needle tx ran for 15 minutes then infiltrated, blood clots noted with needle removal. Dr. Hollie Salk made aware orders to use right subclavian HD cath for remainder tx time

## 2019-02-20 NOTE — ED Provider Notes (Signed)
[facility] EMERGENCY DEPARTMENT Provider Note   CSN: [id number] Arrival date & time: 03/03/2019  0039     History Chief Complaint  Patient presents with   Shortness of Breath    Cristina Wilson is a 69 y.o. female.  The history is provided by the patient and medical records.  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  Patient states symptoms began around 7 PM tonight.  States it felt almost as if there was "congestion" in her "bronchioles" and she could not get this out.  She still feels a little bit of drainage in the back of her throat.  She has not been able to produce any phlegm.  She has not noticed any fever.  She has had both doses of Covid vaccine already (second dose 02/14/19).  States she never had any side effects from either dose of vaccine.  She has not missed any HD treatments.  States she has had some sick contacts at dialysis but unsure if any with COVID (states person beside her at last treatment was coughing horribly).  Past Medical History:  Diagnosis Date   Arthritis    Bell's palsy 2017   "mild case"   Cerebral aneurysm 08/2014   pt. states she has 2 aneurysms   Closed fracture of left distal radius    End stage renal failure on dialysis ([place])    M/W/F on [employer]   GERD (gastroesophageal reflux disease)    Goodpasture syndrome (HCC)    Hypertension    Hypothyroidism    PONV (postoperative nausea and vomiting)    violent vomiting   Thyroid disease     Patient Active Problem List   Diagnosis Date Noted   Encounter for immunization 09/26/2018   Anemia in chronic kidney disease 08/25/2018   End stage renal disease ([place]) 08/25/2018   Secondary hyperparathyroidism of renal origin ([place]) 08/22/2018   Referred otalgia of left ear 08/16/2018   Temporomandibular  jaw dysfunction 08/16/2018   Iron deficiency anemia, unspecified 07/12/2018   Shortness of breath 07/06/2018   Anemia, unspecified 07/05/2018   Anxiety 07/05/2018   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/05/2018   Acute renal failure (ARF) ([place]) 06/22/2018   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   Abdominal pain 06/06/2018   Hydronephrosis due to obstruction of ureteral orifice 06/06/2018   Disease of thyroid gland 04/17/2018   Hyperlipidemia 04/17/2018   Hypertension 04/17/2018   De Quervain's tenosynovitis 02/10/2018   Trigger finger of left thumb 02/10/2018   Chronic low back pain 04/12/2017   Chronic pain syndrome 04/12/2017   Degeneration of lumbar intervertebral disc 03/01/2017   Scoliosis of lumbar spine 03/01/2017   Sacral back pain 05/25/2016   Headache 10/30/2014   Eye pain 10/30/2014    Past Surgical History:  Procedure Laterality Date   ARTERY BIOPSY Right 10/31/2014   Procedure: BIOPSY TEMPORAL ARTERY-RIGHT;  Surgeon: Mal Misty, MD;  Location: [place];  Service: Vascular;  Laterality: Right;   AV FISTULA PLACEMENT Left 10/11/2018   Procedure: left arm ARTERIOVENOUS (AV) FISTULA  creation;  Surgeon: Waynetta Sandy, MD;  Location: [place];  Service: Vascular;  Laterality: Left;   BILATERAL CARPAL TUNNEL RELEASE     BREAST SURGERY Left    lumpectomy   BUNIONECTOMY Right    CHOLECYSTECTOMY N/A 10/13/2016   Procedure: LAPAROSCOPIC  CHOLECYSTECTOMY WITH INTRAOPERATIVE CHOLANGIOGRAM;  Surgeon: Donnie Mesa, MD;  Location: [place];  Service: General;  Laterality: N/A;   EYE SURGERY     surgery for cross eye   FOOT FRACTURE SURGERY     OPEN REDUCTION INTERNAL FIXATION (ORIF) DISTAL RADIAL FRACTURE Left 03/29/2017   Procedure: OPEN REDUCTION INTERNAL FIXATION (ORIF)LEFT  DISTAL RADIAL FRACTURE;  Surgeon: Leanora Cover, MD;  Location: [place];  Service: Orthopedics;  Laterality: Left;    THYROIDECTOMY     TONSILLECTOMY     WISDOM TOOTH EXTRACTION       OB History   No obstetric history on file.     Family History  Problem Relation Age of Onset   COPD Mother    Kidney disease Mother    Heart disease Mother    Cancer Father     Social History   Tobacco Use   Smoking status: Former Smoker    Years: 0.50    Types: Cigarettes    Quit date: 08/07/1989    Years since quitting: 29.5   Smokeless tobacco: Never Used  Substance Use Topics   Alcohol use: Yes    Alcohol/week: 7.0 standard drinks    Types: 7 Glasses of wine per week    Comment: a glass a night, some nights more   Drug use: Not Currently    Types: Marijuana    Comment: last used 1993    Home Medications Prior to Admission medications   Medication Sig Start Date End Date Taking? Authorizing Provider  acetaminophen (TYLENOL) 500 MG tablet Take 500-1,000 mg by mouth every 6 (six) hours as needed for mild pain or headache.    [provider]  B Complex-C-Folic Acid (RENAL-VITE) 0.8 MG TABS Take 1 tablet by mouth daily.  07/31/18   [provider]  bimatoprost (LUMIGAN) 0.03 % ophthalmic solution Place 1 drop into both eyes at bedtime.    [provider]  cyclophosphamide (CYTOXAN) 25 MG capsule Take 25 mg by mouth daily.  07/22/18   [provider]  dapsone 100 MG tablet Take 100 mg by mouth daily. 08/28/18   [provider]  levothyroxine (SYNTHROID, LEVOTHROID) 125 MCG tablet Take 125 mcg by mouth daily before breakfast.  10/22/14   [provider]  Multiple Vitamin (MULTIVITAMIN PO) Take 1 tablet by mouth daily. Venal multivitamin    [provider]  pregabalin (LYRICA) 25 MG capsule Take 50 mg by mouth 2 (two) times daily.     [provider]  sevelamer carbonate (RENVELA) 800 MG tablet Take 1,600 mg by mouth 3 (three) times daily with meals.  07/04/18 07/04/19  [provider]  traZODone (DESYREL) 50 MG tablet  Take 50 mg by mouth at bedtime.  08/22/18   [provider]    Allergies    Sulfa antibiotics  Review of Systems   Review of Systems  Respiratory: Positive for cough and shortness of breath.   All other systems reviewed and are negative.   Physical Exam Updated Vital Signs BP (!) 210/101 (BP Location: Right Arm)    Pulse 79    Temp 98.8 F (37.1 C) (Temporal)    Resp 18    Ht 5\' 4"  (1.626 m)    Wt 72 kg    SpO2 98%    BMI 27.25 kg/m   Physical Exam Vitals and nursing note reviewed.  Constitutional:      Appearance: She is well-developed.  HENT:     Head: Normocephalic  and atraumatic.  Eyes:     Conjunctiva/sclera: Conjunctivae normal.     Pupils: Pupils are equal, round, and reactive to light.  Cardiovascular:     Rate and Rhythm: Normal rate and regular rhythm.     Heart sounds: Normal heart sounds.  Pulmonary:     Effort: Pulmonary effort is normal.     Breath sounds: Normal breath sounds.     Comments: 4L O2 in use, sats around 92%, speaking in sentences but gets winded and stops frequently Abdominal:     General: Bowel sounds are normal.     Palpations: Abdomen is soft.     Tenderness: There is no abdominal tenderness.  Musculoskeletal:        General: Normal range of motion.     Cervical back: Normal range of motion.     Comments: Fistula LUE, surrounding bruising noted that appears old  Skin:    General: Skin is warm and dry.  Neurological:     Mental Status: She is alert and oriented to person, place, and time.     ED Results / Procedures / Treatments   Labs (all labs ordered are listed, but only abnormal results are displayed) Labs Reviewed  CBC WITH DIFFERENTIAL/PLATELET - Abnormal; Notable for the following components:      Result Value   RBC 3.08 (*)    Hemoglobin 9.7 (*)    HCT 30.9 (*)    MCV 100.3 (*)    RDW 15.9 (*)    Platelets 145 (*)    All other components within normal limits  BASIC METABOLIC PANEL - Abnormal; Notable for the  following components:   Potassium 3.4 (*)    Glucose, Bld 120 (*)    BUN 66 (*)    Creatinine, Ser 9.44 (*)    Calcium 8.3 (*)    GFR calc non Af Amer 4 (*)    GFR calc Af Amer 4 (*)    Anion gap 16 (*)    All other components within normal limits  TROPONIN I (HIGH SENSITIVITY) - Abnormal; Notable for the following components:   Troponin I (High Sensitivity) 48 (*)    All other components within normal limits  RESPIRATORY PANEL BY RT PCR (FLU A&B, COVID)  POC SARS CORONAVIRUS 2 AG -  ED    EKG EKG Interpretation  Date/Time:  Tuesday February 20 2019 00:45:28 EST Ventricular Rate:  79 PR Interval:    QRS Duration: 96 QT Interval:  433 QTC Calculation: 497 R Axis:   20 Text Interpretation: Sinus rhythm Probable left atrial enlargement LVH with secondary repolarization abnormality Anterior infarct, old No significant change since last tracing Confirmed by Pryor Curia [phone]) on 02/23/2019 1:07:37 AM   Radiology DG Chest Port 1 View  Result Date: 02/26/2019 CLINICAL DATA:  Cough and shortness of breath. EXAM: PORTABLE CHEST 1 VIEW COMPARISON:  Chest CT 05/12/2018 FINDINGS: Right-sided dialysis catheter in place. Cardiomegaly. Interstitial and airspace opacities throughout the right lung and left lung base. Possible small pleural effusions. No pneumothorax. No acute osseous abnormalities are seen. IMPRESSION: 1. Cardiomegaly. 2. Heterogeneous interstitial and airspace opacities throughout both lungs, right greater than left. Findings may represent pulmonary edema or pneumonia, or combination thereof. Electronically Signed   By: Keith Rake M.D.   On: 02/27/2019 01:29    Procedures Procedures (including critical care time)  Medications Ordered in ED Medications - No data to display  ED Course  I have reviewed the triage vital signs and the nursing  notes.  Pertinent labs & imaging results that were available during my care of the patient were reviewed by me and considered  in my medical decision making (see chart for details).    MDM Rules/Calculators/A&P  69 year old female presenting to the ED with shortness of breath, onset last evening around 7 PM.  She does report cough and congestion noted in the back of her throat.  She has had some sick contacts at dialysis, unsure if they had Covid.  She has received both doses of her Covid vaccination thus far.  She is afebrile and nontoxic in appearance.  Her sats around 92 to 93% on 4 L supplemental oxygen, generally does not require home O2.  She does have a little bit of a wet cough on exam.  Plan for screening labs, Covid screen, portable chest x-ray.  Labs as above, potassium within normal limits.  Normal white count.  Chest x-ray with interstitial and airspace opacities bilaterally, R > L..  Given her symptoms and reported recent sick contacts, may represent fluid overload with underlying infection.  Covid screen negative.  Will start on antibiotics and admit given her ongoing oxygen requirement.  She will need dialysis in the morning.  Discussed with Dr. Linda Hedges-- will admit for ongoing care, he will consult nephrology in AM to arrange dialysis.  Suggested dose of lasix 80mg  IV, however as patient is ESRD and has not made urine since May 2020, I do not think this will be beneficial.  She is stable on supplemental O2 and resting comfortably at this time.  She did request tylenol for a headache which was ordered.  Final Clinical Impression(s) / ED Diagnoses Final diagnoses:  Shortness of breath    Rx / DC Orders ED Discharge Orders    None       Larene Pickett, PA-C 02/25/2019 0600    Ward, Delice Bison, DO 02/14/2019 [phone]

## 2019-02-20 NOTE — Progress Notes (Signed)
Patient ID: Cristina Wilson, female   DOB: Feb 09, 1950, 69 y.o.   MRN: [id number] Patient was admitted early this morning for shortness of breath and chest x-ray showed probable pulmonary edema.  Nephrology was consulted.  I have reviewed patient's medical records including this morning's H&P, current vitals, labs, medications myself.  Nephrology is planning to do hemodialysis today.  Repeat a.m. labs.

## 2019-02-20 NOTE — H&P (Signed)
History and Physical    Cristina Wilson TOI:[id number] DOB: February 12, 1950 DOA: 02/22/2019  PCP: Cristina Manes, MD (Confirm with patient/family/NH records and if not entered, this has to be entered at [facility] point of entry) Patient coming from: home  I have personally briefly reviewed patient's old medical records in [place]  Chief Complaint: SOB  HPI: Cristina Wilson is a 69 y.o. female with medical history significant of goodpastures syndrome which presented with acute progressive nephritis. She has ESRD and is on HD Tu,Th and Sat. She developed increasing SOB at home for which she presented to [facility]. (For level 3, the HPI must include 4+ descriptors: Location, Quality, Severity, Duration, Timing, Context, modifying factors, associated signs/symptoms and/or status of 3+ chronic problems.)  (Please avoid self-populating past medical history here) (The initial 2-3 lines should be focused and good to copy and paste in the HPI section of the daily progress note).  ED Course: Hemodynamically stable.Afebrile, mild cough that was non-productive. She has mild hypoxemia requiring Oxygen support. Lab revealed nl CBC, glucose 120, Trop 48. CXR read out a pulmonary edema vs atypical PNA. Patient was given dose of abx, not repeated. TRH called to admit patient.  Review of Systems: As per HPI otherwise 10 point review of systems negative.  Unacceptable ROS statements: "10 systems reviewed," "Extensive" (without elaboration).  Acceptable ROS statements: "All others negative," "All others reviewed and are negative," and "All others unremarkable," with at [place] documented Can't double dip - if using for HPI can't use for ROS  Past Medical History:  Diagnosis Date  . Arthritis   . Bell's palsy 2017   "mild case"  . Cerebral aneurysm 08/2014   pt. states she has 2 aneurysms  . Closed fracture of left distal radius   . End stage renal failure on dialysis [facility])    M/W/F on [employer]  . GERD (gastroesophageal reflux disease)   . Goodpasture syndrome ([place])   . Hypertension   . Hypothyroidism   . PONV (postoperative nausea and vomiting)    violent vomiting  . Thyroid disease     Past Surgical History:  Procedure Laterality Date  . ARTERY BIOPSY Right 10/31/2014   Procedure: BIOPSY TEMPORAL ARTERY-RIGHT;  Surgeon: Mal Misty, MD;  Location: [place];  Service: Vascular;  Laterality: Right;  . AV FISTULA PLACEMENT Left 10/11/2018   Procedure: left arm ARTERIOVENOUS (AV) FISTULA  creation;  Surgeon: Waynetta Sandy, MD;  Location: [place];  Service: Vascular;  Laterality: Left;  . BILATERAL CARPAL TUNNEL RELEASE    . BREAST SURGERY Left    lumpectomy  . BUNIONECTOMY Right   . CHOLECYSTECTOMY N/A 10/13/2016   Procedure: LAPAROSCOPIC CHOLECYSTECTOMY WITH INTRAOPERATIVE CHOLANGIOGRAM;  Surgeon: Donnie Mesa, MD;  Location: [place];  Service: General;  Laterality: N/A;  . EYE SURGERY     surgery for cross eye  . FOOT FRACTURE SURGERY    . OPEN REDUCTION INTERNAL FIXATION (ORIF) DISTAL RADIAL FRACTURE Left 03/29/2017   Procedure: OPEN REDUCTION INTERNAL FIXATION (ORIF)LEFT  DISTAL RADIAL FRACTURE;  Surgeon: Leanora Cover, MD;  Location: [place];  Service: Orthopedics;  Laterality: Left;  . THYROIDECTOMY    . TONSILLECTOMY    . 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 Independent in all ADLs   reports that she quit smoking about 29 years ago. Her smoking use included cigarettes. She quit after 0.50 years of use. She  has never used smokeless tobacco. She reports current alcohol use of about 7.0 standard drinks of alcohol per week. She reports previous drug use. Drug: Marijuana.  Allergies  Allergen Reactions  . Sulfa Antibiotics Rash    Family History  Problem Relation Age of Onset  . COPD Mother   . Kidney disease Mother   . Heart disease Mother   . Cancer Father    Unacceptable:  Noncontributory, unremarkable, or negative. Acceptable: Family history reviewed and not pertinent (If you reviewed it)  Prior to Admission medications   Medication Sig Start Date End Date Taking? Authorizing Provider  acetaminophen (TYLENOL) 500 MG tablet Take 500-1,000 mg by mouth every 6 (six) hours as needed for mild pain or headache.   Yes [provider]  B Complex-C-Folic Acid (RENAL-VITE) 0.8 MG TABS Take 1 tablet by mouth daily.  07/31/18  Yes [provider]  carvedilol (COREG) 6.25 MG tablet Take 6.25 mg by mouth See admin instructions. 1 tab Tues Thurs and Sat; 1 twice daily all other days   Yes [provider]  cyclophosphamide (CYTOXAN) 25 MG capsule Take 25 mg by mouth daily.  07/22/18  Yes [provider]  dapsone 100 MG tablet Take 100 mg by mouth daily. 08/28/18  Yes [provider]  latanoprost (XALATAN) 0.005 % ophthalmic solution Place 1 drop into both eyes at bedtime.   Yes [provider]  levothyroxine (SYNTHROID, LEVOTHROID) 125 MCG tablet Take 125 mcg by mouth daily before breakfast.  10/22/14  Yes [provider]  [place] See admin instructions. Pt keeps sugar free candy or gum with her to keep her mouth from getting dry   Yes [provider]  pregabalin (LYRICA) 75 MG capsule Take 75 mg by mouth 2 (two) times daily.    Yes [provider]  sucroferric oxyhydroxide (VELPHORO) 500 MG chewable tablet Chew 1,000 mg by mouth 3 (three) times daily with meals.   Yes [provider]  traZODone (DESYREL) 50 MG tablet Take 50 mg by mouth at bedtime.  08/22/18  Yes [provider]    Physical Exam: Vitals:   02/16/2019 0400 02/06/2019 0430 02/19/2019 0500 02/09/2019 0530  BP: (!) 154/66 (!) 151/66 (!) 142/60 (!) 145/66  Pulse: 77 76 70 71  Resp: 14 18 16 18   Temp:      TempSrc:      SpO2: 95% 95% 96% 96%  Weight:      Height:        Constitutional: NAD, calm,  comfortable Vitals:   02/08/2019 0400 02/06/2019 0430 02/07/2019 0500 02/23/2019 0530  BP: (!) 154/66 (!) 151/66 (!) 142/60 (!) 145/66  Pulse: 77 76 70 71  Resp: 14 18 16 18   Temp:      TempSrc:      SpO2: 95% 95% 96% 96%  Weight:      Height:       General: WNWD woman in no distress Eyes: PERRL, lids and conjunctivae normal ENMT: Mucous membranes are moist. Posterior pharynx clear of any exudate or lesions.Normal dentition.  Neck: normal, supple, no masses, no thyromegaly Respiratory: No increased WOB. Good air movement bilaterally. Bibasilar. Crackles, no wheezing Cardiovascular: Regular rate and rhythm, no murmurs / rubs / gallops. No extremity edema. 2+ pedal pulses. No carotid bruits.  Abdomen: no tenderness, no masses palpated. No hepatosplenomegaly. Bowel sounds positive.  Musculoskeletal: no clubbing / cyanosis. No joint deformity upper and lower extremities. Good ROM, no contractures. Normal muscle tone.  Skin: no rashes, lesions, ulcers. No induration Neurologic: CN 2-12 grossly intact. Sensation intact, DTR normal. Strength 5/5 in all 4.  Psychiatric: Normal judgment and insight. Alert and oriented x 3. Normal mood.   (Anything < 9 systems with 2 bullets each down codes to level 1) (If patient refuses exam can't bill higher level) (Make sure to document decubitus ulcers present on admission -- if possible -- and whether patient has chronic indwelling catheter at time of admission)  Labs on Admission: I have personally reviewed following labs and imaging studies  CBC: Recent Labs  Lab 02/09/2019 0100  WBC 7.2  NEUTROABS 5.5  HGB 9.7*  HCT 30.9*  MCV 100.3*  PLT 458*   Basic Metabolic Panel: Recent Labs  Lab 02/09/2019 0100  NA 142  K 3.4*  CL 99  CO2 27  GLUCOSE 120*  BUN 66*  CREATININE 9.44*  CALCIUM 8.3*   GFR: Estimated Creatinine Clearance: 5.6 mL/min (A) (by C-G formula based on SCr of 9.44 mg/dL (H)). Liver Function Tests: No results for input(s): AST,  ALT, ALKPHOS, BILITOT, PROT, ALBUMIN in the last 168 hours. No results for input(s): LIPASE, AMYLASE in the last 168 hours. No results for input(s): AMMONIA in the last 168 hours. Coagulation Profile: No results for input(s): INR, PROTIME in the last 168 hours. Cardiac Enzymes: No results for input(s): CKTOTAL, CKMB, CKMBINDEX, TROPONINI in the last 168 hours. BNP (last 3 results) No results for input(s): PROBNP in the last 8760 hours. HbA1C: No results for input(s): HGBA1C in the last 72 hours. CBG: No results for input(s): GLUCAP in the last 168 hours. Lipid Profile: No results for input(s): CHOL, HDL, LDLCALC, TRIG, CHOLHDL, LDLDIRECT in the last 72 hours. Thyroid Function Tests: No results for input(s): TSH, T4TOTAL, FREET4, T3FREE, THYROIDAB in the last 72 hours. Anemia Panel: No results for input(s): VITAMINB12, FOLATE, FERRITIN, TIBC, IRON, RETICCTPCT in the last 72 hours. Urine analysis: No results found for: COLORURINE, APPEARANCEUR, [place], [place], GLUCOSEU, HGBUR, BILIRUBINUR, KETONESUR, PROTEINUR, UROBILINOGEN, NITRITE, LEUKOCYTESUR  Radiological Exams on Admission: DG Chest Port 1 View  Result Date: 03/02/2019 CLINICAL DATA:  Cough and shortness of breath. EXAM: PORTABLE CHEST 1 VIEW COMPARISON:  Chest CT 05/12/2018 FINDINGS: Right-sided dialysis catheter in place. Cardiomegaly. Interstitial and airspace opacities throughout the right lung and left lung base. Possible small pleural effusions. No pneumothorax. No acute osseous abnormalities are seen. IMPRESSION: 1. Cardiomegaly. 2. Heterogeneous interstitial and airspace opacities throughout both lungs, right greater than left. Findings may represent pulmonary edema or pneumonia, or combination thereof. Electronically Signed   By: Keith Rake M.D.   On: 02/18/2019 01:29    EKG: Independently reviewed. Sinu rhythm, LVH, old inferior infarct, no acute changes  Assessment/Plan Active Problems:   Pulmonary edema    Hypertension   End stage renal disease ([place])  (please populate well all problems here in Problem List. (For example, if patient is on BP meds at home and you resume or decide to hold them, it is a problem that needs to be her. Same for CAD, COPD, HLD and so on)   1. Pulmonary edema - SOB, crackles on exam, no fever, no sputum production. Patient denies any hemoptysis Plan HD - nephrology notified of admission and need for HD today.  2. HTN - conitnue home meds  DVT prophylaxis: pharmacy to consult (Lovenox/Heparin/SCD's/anticoagulated/None (if comfort care) Code Status: full code (Full/Partial (specify details) Family Communication: no family contact. Friend is listed - deferred calling due to hour (Specify name,  relationship. Do not write "discussed with patient". Specify tel # if discussed over the phone) Disposition Plan: home when stable (specify when and where you expect patient to be discharged) Consults called: renal - Dr. Carolin Sicks notified and called back. Will secure nephrology f/u and HD (with names) Admission status: inpatient (inpatient / obs / tele / medical floor / SDU)   Adella Hare MD [facility] Pager [phone]  If 7PM-7AM, please contact night-coverage [email or web address] Password [facility]  02/28/2019, 6:17 AM

## 2019-02-20 NOTE — Progress Notes (Addendum)
Nephrology Quick Note:  Pt has completed HD - 3L net UF, tolerated without issues. Asking about going home - apparently she has an appt with general surgery in [place] tomorrow at 11am to discuss PD cath placement and she wants to make it if possible. Discussed we would have to see if can wean down her O2. She was insistent to take cannula off completely - wants to see if can go without. Dialysis RN calling report - will have them follow pulse ox when gets to her room. Will leave it to her hospitalist for discharge plans.  Veneta Penton, PA-C [facility] Pager [phone]    Update (4:20p): O2 sats checked again -> 89% - put nasal O2 2L/min back on her.

## 2019-02-20 NOTE — Procedures (Signed)
Patient seen and examined on Hemodialysis. BP (!) 144/71   Pulse 67   Temp 98.1 F (36.7 C) (Oral)   Resp 18   Ht 5\' 4"  (1.626 m)   Wt 65.4 kg   SpO2 98%   BMI 24.75 kg/m   QB 400 mL/ min via TDC, UF goal 3.5L  Pulled clots from her AVF today so using TDC.  May need repeat OP fgram  Tolerating treatment without complaints at this time.   Madelon Lips MD 12:57 PM

## 2019-02-20 NOTE — Consult Note (Signed)
[facility] Renal Consultation Note    Indication for Consultation:  Management of ESRD/hemodialysis, anemia, hypertension/volume, and secondary hyperparathyroidism. PCP:  HPI: Cristina Wilson is a 69 y.o. female with ESRD d/t anti-GBM, HTN, and hypothyroidism who is being admitted with acute respiratory failure.   She reports that was in her usual state of health until last night when she developed shortness of breath, "crackling" throughout her chest, and cough. No fever or chills, cough is not productive, no CP, abdominal pain, N/V, or diarrhea. She does endorse "muscle shaking."   In ED, found to be hypertensive and hypoxic requiring 4L nasal O2. Initial labs show Na 142, K 3.4, BUN 66, BNP 1154, WBC 7.2, Hgb 9.7. Flu/COVID negative. CXR showed diffuse bilateral interstitial opacities - pulm edema v. infection.  Denies hemoptysis. 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 Never had pulm involvement. Did PLEX and remains on tapered dose oral cyclophosphamide. Cannot find result, but per her outpatient HD notes, she did have anti-GBM drawn 12/2018 which was "negative".   Dialyzes on TTS schedule at [facility] - last treatment 2/13 which she completed in entirety - actually left below dry weight. Using [facility] as access - has great looking LUE AVF but has had repeated infiltrations. It appears that she has been losing weight recently. She appears to be highly functional and athletic - reports that typically walks 4-7 miles daily and lifts weights.  Past Medical History:  Diagnosis Date  . Arthritis   . Bell's palsy 2017   "mild case"  . Cerebral aneurysm 08/2014   pt. states she has 2 aneurysms  . Closed fracture of left distal radius   . End stage renal failure on dialysis [facility])    M/W/F on [employer]  . GERD (gastroesophageal reflux disease)   . 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 Hypertension   . Hypothyroidism   . PONV (postoperative  nausea and vomiting)    violent vomiting  . Thyroid disease    Past Surgical History:  Procedure Laterality Date  . ARTERY BIOPSY Right 10/31/2014   Procedure: BIOPSY TEMPORAL ARTERY-RIGHT;  Surgeon: Mal Misty, MD;  Location: [place];  Service: Vascular;  Laterality: Right;  . AV FISTULA PLACEMENT Left 10/11/2018   Procedure: left arm ARTERIOVENOUS (AV) FISTULA  creation;  Surgeon: Waynetta Sandy, MD;  Location: [place];  Service: Vascular;  Laterality: Left;  . BILATERAL CARPAL TUNNEL RELEASE    . BREAST SURGERY Left    lumpectomy  . BUNIONECTOMY Right   . CHOLECYSTECTOMY N/A 10/13/2016   Procedure: LAPAROSCOPIC CHOLECYSTECTOMY WITH INTRAOPERATIVE CHOLANGIOGRAM;  Surgeon: Donnie Mesa, MD;  Location: [place];  Service: General;  Laterality: N/A;  . EYE SURGERY     surgery for cross eye  . FOOT FRACTURE SURGERY    . OPEN REDUCTION INTERNAL FIXATION (ORIF) DISTAL RADIAL FRACTURE Left 03/29/2017   Procedure: OPEN REDUCTION INTERNAL FIXATION (ORIF)LEFT  DISTAL RADIAL FRACTURE;  Surgeon: Leanora Cover, MD;  Location: [place];  Service: Orthopedics;  Laterality: Left;  . THYROIDECTOMY    . TONSILLECTOMY    . WISDOM TOOTH EXTRACTION     Family History  Problem Relation Age of Onset  . COPD Mother   . Kidney disease Mother   . Heart disease Mother   . Cancer Father    Social History:  reports that she quit smoking about 29 years ago. Her smoking use included cigarettes. She quit after 0.50 years of use.  She has never used smokeless tobacco. She reports current alcohol use of about 7.0 standard drinks of alcohol per week. She reports previous drug use. Drug: Marijuana.  ROS: As per HPI otherwise negative.  Physical Exam: Vitals:   02/21/2019 0630 02/16/2019 0700 02/22/2019 0800 02/28/2019 0900  BP: (!) 154/67 (!) 161/68 (!) 168/68 (!) 165/78  Pulse: 71 68 86 75  Resp: 18  18 19   Temp:      TempSrc:      SpO2: 99% 95% 96% 97%  Weight:      Height:          General: Well developed, thin woman, in no acute distress. Using nasal O2. Head: Normocephalic, atraumatic, sclera non-icteric, mucus membranes are moist. Neck: Supple without lymphadenopathy/masses. Lungs: Coarse air movement throughout all lung fields, no overt rales Heart: RRR with normal S1, S2. No murmurs, rubs, or gallops appreciated. Abdomen: Soft, non-tender, non-distended with normoactive bowel sounds.  Musculoskeletal:  Strength and tone appear normal for age. Lower extremities: No LE edema, no open wounds. Neuro: Alert and oriented X 3. Moves all extremities spontaneously. Psych:  Responds to questions appropriately with a normal affect. Dialysis Access: TDC in R chest, useable looking RUE AVF + thrill - scattered bruising  Allergies  Allergen Reactions  . Sulfa Antibiotics Rash   Prior to Admission medications   Medication Sig Start Date End Date Taking? Authorizing Provider  acetaminophen (TYLENOL) 500 MG tablet Take 500-1,000 mg by mouth every 6 (six) hours as needed for mild pain or headache.   Yes [provider]  B Complex-C-Folic Acid (RENAL-VITE) 0.8 MG TABS Take 1 tablet by mouth daily.  07/31/18  Yes [provider]  carvedilol (COREG) 6.25 MG tablet Take 6.25 mg by mouth See admin instructions. 1 tab Tues Thurs and Sat; 1 twice daily all other days   Yes [provider]  cyclophosphamide (CYTOXAN) 25 MG capsule Take 25 mg by mouth daily.  07/22/18  Yes [provider]  dapsone 100 MG tablet Take 100 mg by mouth daily. 08/28/18  Yes [provider]  latanoprost (XALATAN) 0.005 % ophthalmic solution Place 1 drop into both eyes at bedtime.   Yes [provider]  levothyroxine (SYNTHROID, LEVOTHROID) 125 MCG tablet Take 125 mcg by mouth daily before breakfast.  10/22/14  Yes [provider]  [place] See admin instructions. Pt keeps sugar free candy or gum with her to keep her mouth from  getting dry   Yes [provider]  pregabalin (LYRICA) 75 MG capsule Take 75 mg by mouth 2 (two) times daily.    Yes [provider]  sucroferric oxyhydroxide (VELPHORO) 500 MG chewable tablet Chew 1,000 mg by mouth 3 (three) times daily with meals.   Yes [provider]  traZODone (DESYREL) 50 MG tablet Take 50 mg by mouth at bedtime.  08/22/18  Yes [provider]   Current Facility-Administered Medications  Medication Dose Route Frequency Provider Last Rate Last Admin  . 0.9 %  sodium chloride infusion  250 mL Intravenous PRN Norins, Heinz Knuckles, MD      . 0.9 %  sodium chloride infusion  100 mL Intravenous PRN Madelon Lips, MD      . 0.9 %  sodium chloride infusion  100 mL Intravenous PRN Madelon Lips, MD      . acetaminophen (TYLENOL) tablet 500-1,000 mg  500-1,000 mg Oral Q6H PRN Norins, Heinz Knuckles, MD      . alteplase ([place]  ACTIVASE) injection 2 mg  2 mg Intracatheter Once PRN Madelon Lips, MD      . Chlorhexidine Gluconate Cloth 2 % PADS 6 each  6 each Topical Q0600 Madelon Lips, MD      . cyclophosphamide (CYTOXAN) capsule 25 mg  25 mg Oral Daily Norins, Heinz Knuckles, MD      . dapsone tablet 100 mg  100 mg Oral Daily Norins, Heinz Knuckles, MD      . heparin injection 1,000 Units  1,000 Units Dialysis PRN Madelon Lips, MD      . heparin injection 2,000 Units  2,000 Units Dialysis PRN Madelon Lips, MD      . heparin injection 5,000 Units  5,000 Units Subcutaneous Q8H Norins, Heinz Knuckles, MD      . levothyroxine (SYNTHROID) tablet 125 mcg  125 mcg Oral QAC breakfast Neena Rhymes, MD   125 mcg at 03/01/2019 0803  . lidocaine (PF) (XYLOCAINE) 1 % injection 5 mL  5 mL Intradermal PRN Madelon Lips, MD      . lidocaine-prilocaine (EMLA) cream 1 application  1 application Topical PRN Madelon Lips, MD      . multivitamin (RENA-VIT) tablet 1 tablet  1 tablet Oral QHS Norins, Heinz Knuckles, MD      . pentafluoroprop-tetrafluoroeth  (GEBAUERS) aerosol 1 application  1 application Topical PRN Madelon Lips, MD      . pregabalin (LYRICA) capsule 50 mg  50 mg Oral BID Neena Rhymes, MD   50 mg at 02/15/2019 5093  . sevelamer carbonate (RENVELA) tablet 1,600 mg  1,600 mg Oral TID WC Norins, Heinz Knuckles, MD   1,600 mg at 02/18/2019 0925  . sodium chloride flush (NS) 0.9 % injection 3 mL  3 mL Intravenous Q12H Norins, Heinz Knuckles, MD      . sodium chloride flush (NS) 0.9 % injection 3 mL  3 mL Intravenous PRN Norins, Heinz Knuckles, MD      . traZODone (DESYREL) tablet 50 mg  50 mg Oral QHS Norins, Heinz Knuckles, MD       Current Outpatient Medications  Medication Sig Dispense Refill  . acetaminophen (TYLENOL) 500 MG tablet Take 500-1,000 mg by mouth every 6 (six) hours as needed for mild pain or headache.    . B Complex-C-Folic Acid (RENAL-VITE) 0.8 MG TABS Take 1 tablet by mouth daily.     . carvedilol (COREG) 6.25 MG tablet Take 6.25 mg by mouth See admin instructions. 1 tab Tues Thurs and Sat; 1 twice daily all other days    . cyclophosphamide (CYTOXAN) 25 MG capsule Take 25 mg by mouth daily.     . dapsone 100 MG tablet Take 100 mg by mouth daily.    Marland Kitchen latanoprost (XALATAN) 0.005 % ophthalmic solution Place 1 drop into both eyes at bedtime.    Marland Kitchen levothyroxine (SYNTHROID, LEVOTHROID) 125 MCG tablet Take 125 mcg by mouth daily before breakfast.     . OVER THE COUNTER MEDICATION See admin instructions. Pt keeps sugar free candy or gum with her to keep her mouth from getting dry    . pregabalin (LYRICA) 75 MG capsule Take 75 mg by mouth 2 (two) times daily.     . sucroferric oxyhydroxide (VELPHORO) 500 MG chewable tablet Chew 1,000 mg by mouth 3 (three) times daily with meals.    . traZODone (DESYREL) 50 MG tablet Take 50 mg by mouth at bedtime.      Labs: Basic Metabolic Panel: Recent Labs  Lab 02/08/2019 0100  NA 142  K 3.4*  CL 99  CO2 27  GLUCOSE 120*  BUN 66*  CREATININE 9.44*  CALCIUM 8.3*   CBC: Recent Labs  Lab  02/10/2019 0100  WBC 7.2  NEUTROABS 5.5  HGB 9.7*  HCT 30.9*  MCV 100.3*  PLT 145*   Studies/Results: DG Chest Port 1 View  Result Date: 02/23/2019 CLINICAL DATA:  Cough and shortness of breath. EXAM: PORTABLE CHEST 1 VIEW COMPARISON:  Chest CT 05/12/2018 FINDINGS: Right-sided dialysis catheter in place. Cardiomegaly. Interstitial and airspace opacities throughout the right lung and left lung base. Possible small pleural effusions. No pneumothorax. No acute osseous abnormalities are seen. IMPRESSION: 1. Cardiomegaly. 2. Heterogeneous interstitial and airspace opacities throughout both lungs, right greater than left. Findings may represent pulmonary edema or pneumonia, or combination thereof. Electronically Signed   By: Keith Rake M.D.   On: 02/24/2019 01:29    Dialysis Orders:  TTS at [facility] 4:15hr, 400/800, EDW 65.5kg, 2K/2Ca, UFP #4, TDC + AVF, heparin 2400 units - Mircera 136mcg IV q 2 weeks (last 2/13) - Venofer 50mg  IV q week - Calcitriol 0.41mcg PO q HD - Binder: Velphoro v. Fosrenol?  Assessment/Plan: 1.  Acute Resp Failure: CXR with bilateral opacities - assuming pulm edema. Afebrile and WBC normal range, doubt pneumonia. Will plan to dialyze today with max UF to improve resp status. Suspect she has recently lost weight and needs lower EDW. 2.  ESRD: Usual TTS sched - see above, HD today and will need lower EDW on d/c. Requesting to use AVF today - will try to cannulate. 3. 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 No Hx pulm involvement nor hemoptysis. 4.  Hypertension/volume: BP high - follow with volume correction with HD. She reports Hx syncope and hypotension on HD - will monitor very closely. 5.  Anemia: Hgb 9.7 - ESA recently given at outpt clinic - follow. 6.  Metabolic bone disease: Ca ok, Phos pending. Continue home binders. 7.  Hypothyroidism 8.  Muscle twitching: ?Related to Lyrica - will  reduce dose and also check TSH.  Veneta Penton, PA-C 03/02/2019, 10:11 AM  [facility] Pager: [phone]

## 2019-02-21 ENCOUNTER — Telehealth: Payer: Self-pay

## 2019-02-21 DIAGNOSIS — J810 Acute pulmonary edema: Secondary | ICD-10-CM | POA: Diagnosis not present

## 2019-02-21 DIAGNOSIS — I1311 Hypertensive heart and chronic kidney disease without heart failure, with stage 5 chronic kidney disease, or end stage renal disease: Secondary | ICD-10-CM | POA: Diagnosis not present

## 2019-02-21 DIAGNOSIS — R0902 Hypoxemia: Secondary | ICD-10-CM | POA: Diagnosis not present

## 2019-02-21 DIAGNOSIS — I10 Essential (primary) hypertension: Secondary | ICD-10-CM

## 2019-02-21 DIAGNOSIS — N186 End stage renal disease: Secondary | ICD-10-CM | POA: Diagnosis not present

## 2019-02-21 HISTORY — DX: Hypoxemia: R09.02

## 2019-02-21 LAB — CBC WITH DIFFERENTIAL/PLATELET
Abs Immature Granulocytes: 0.02 10*3/uL (ref 0.00–0.07)
Basophils Absolute: 0.1 10*3/uL (ref 0.0–0.1)
Basophils Relative: 2 %
Eosinophils Absolute: 0.2 10*3/uL (ref 0.0–0.5)
Eosinophils Relative: 3 %
HCT: 30.8 % — ABNORMAL LOW (ref 36.0–46.0)
Hemoglobin: 9.6 g/dL — ABNORMAL LOW (ref 12.0–15.0)
Immature Granulocytes: 0 %
Lymphocytes Relative: 26 %
Lymphs Abs: 1.2 10*3/uL (ref 0.7–4.0)
MCH: 31.5 pg (ref 26.0–34.0)
MCHC: 31.2 g/dL (ref 30.0–36.0)
MCV: 101 fL — ABNORMAL HIGH (ref 80.0–100.0)
Monocytes Absolute: 0.6 10*3/uL (ref 0.1–1.0)
Monocytes Relative: 14 %
Neutro Abs: 2.4 10*3/uL (ref 1.7–7.7)
Neutrophils Relative %: 55 %
Platelets: 169 10*3/uL (ref 150–400)
RBC: 3.05 MIL/uL — ABNORMAL LOW (ref 3.87–5.11)
RDW: 15.8 % — ABNORMAL HIGH (ref 11.5–15.5)
WBC: 4.5 10*3/uL (ref 4.0–10.5)
nRBC: 0 % (ref 0.0–0.2)

## 2019-02-21 LAB — BASIC METABOLIC PANEL
Anion gap: 11 (ref 5–15)
BUN: 29 mg/dL — ABNORMAL HIGH (ref 8–23)
CO2: 29 mmol/L (ref 22–32)
Calcium: 9 mg/dL (ref 8.9–10.3)
Chloride: 103 mmol/L (ref 98–111)
Creatinine, Ser: 6.13 mg/dL — ABNORMAL HIGH (ref 0.44–1.00)
GFR calc Af Amer: 8 mL/min — ABNORMAL LOW (ref >60)
GFR calc non Af Amer: 7 mL/min — ABNORMAL LOW (ref >60)
Glucose, Bld: 99 mg/dL (ref 70–99)
Potassium: 4.3 mmol/L (ref 3.5–5.1)
Sodium: 143 mmol/L (ref 135–145)

## 2019-02-21 MED ORDER — PREGABALIN 50 MG PO CAPS: 50.0000 mg | ORAL_CAPSULE | Freq: Every day | ORAL | 0 refills | Status: DC

## 2019-02-21 NOTE — Discharge Summary (Addendum)
Physician Discharge Summary  Cristina Wilson IEP:[id number] DOB: [birth year] DOA: 02/22/2019  PCP: Lajean Manes, MD  Admit date: 02/17/2019 Discharge date: 02/21/2019  Admitted From: home Discharge disposition: home   Recommendations for Outpatient Follow-Up:   1. Follow up with general surgery in [facility] today as scheduled 2. Resume dialysis schedule noting nephrology noted may need lower EDW due to weight loss. Follow up with nephrologist for evaluation of need for repeat OP fgram.   Discharge Diagnosis:   Active Problems:   Hypertension   End stage renal disease ([place])   Pulmonary edema   Chronic low back pain    Discharge Condition: Improved.  Diet recommendation: Low sodium, heart healthy.  Carbohydrate-modified..  Wound care: None.  Code status: Full.   History of Present Illness:   Cristina Wilson is a 69 y.o. female with medical history significant of goodpastures syndrome which presented 2/16 with acute progressive nephritis. She has ESRD and is on HD Tu,Th and Sat. She developed increasing SOB at home for which she presented to [facility]. work-up in the emergency department reveals mild hypoxemia and chest x-ray with pulmonary edema.   Hospital Course by Problem:  #1.  Acute hypoxemia.  Secondary to pulmonary edema per x-ray.  Oxygen saturation level 92% on 4 L nasal cannula.  Chest x-ray consistent with pulmonary edema.  No leukocytosis, patient is afebrile hemodynamically stable and nontoxic. patient did not miss any dialysis sessions and completed each session.  Evaluated by nephrology who recommended extra dialysis session and opined perhaps her EDW needs to be changed due to her weight loss.  She underwent dialysis.  On day of discharge she is ambulating in her room oxygen saturation level is greater than 90% on room air.  #2.  Pulmonary edema.  Chest x-ray as noted above.  See #1.  Resolved at discharge status post extra dialysis  session  #3.  End-stage renal disease.  She is a Tuesday Thursday Saturday dialysis person.  She was evaluated by nephrology who opined patient likely needs a lower EDW given her recent weight loss.  Nephrology recommends she resume her regular dialysis schedule.  Of note nephrology noted clots pulled from AVF so TDC used and recommended consideration of OP fgram.  4.  Chronic back pain.  Stable at baseline.  Patient ambulating in her room with a steady gait.  #5.  Hypertension.  Initially pressure elevated.  Home meds include Coreg.  At discharge blood pressure controlled.  We will continue home meds   Medical Consultants:   [facility] nephrology   Discharge Exam:   Vitals:   02/19/2019 1736 02/23/2019 2149  BP: (!) 124/92 139/61  Pulse: 68 86  Resp: 18 16  Temp: 98.5 F (36.9 C) 98.7 F (37.1 C)  SpO2: 99% 91%   Vitals:   02/09/2019 1530 03/02/2019 1550 02/23/2019 1736 02/09/2019 2149  BP: 135/76 122/87 (!) 124/92 139/61  Pulse: 64 66 68 86  Resp:  18 18 16   Temp:  98.2 F (36.8 C) 98.5 F (36.9 C) 98.7 F (37.1 C)  TempSrc:  Oral Oral Oral  SpO2:  98% 99% 91%  Weight:  63 kg    Height:        General exam: Appears calm and comfortable.  Respiratory system: Clear to auscultation. Respiratory effort normal. Cardiovascular system: S1 & S2 heard, RRR. No JVD,  rubs, gallops or clicks. No murmurs. Gastrointestinal system: Abdomen is nondistended, soft and nontender. No organomegaly or masses felt.  Normal bowel sounds heard. Central nervous system: Alert and oriented. No focal neurological deficits. Extremities: No clubbing,  or cyanosis. No edema. Skin: No rashes, lesions or ulcers. Psychiatry: Judgement and insight appear normal. Mood & affect appropriate.    The results of significant diagnostics from this hospitalization (including imaging, microbiology, ancillary and laboratory) are listed below for reference.     Procedures and Diagnostic Studies:   DG Chest Port 1  View  Result Date: 02/16/2019 CLINICAL DATA:  Cough and shortness of breath. EXAM: PORTABLE CHEST 1 VIEW COMPARISON:  Chest CT 05/12/2018 FINDINGS: Right-sided dialysis catheter in place. Cardiomegaly. Interstitial and airspace opacities throughout the right lung and left lung base. Possible small pleural effusions. No pneumothorax. No acute osseous abnormalities are seen. IMPRESSION: 1. Cardiomegaly. 2. Heterogeneous interstitial and airspace opacities throughout both lungs, right greater than left. Findings may represent pulmonary edema or pneumonia, or combination thereof. Electronically Signed   By: Keith Rake M.D.   On: 03/03/2019 01:29     Labs:   Basic Metabolic Panel: Recent Labs  Lab 02/25/2019 0100 02/21/19 0619  NA 142 143  K 3.4* 4.3  CL 99 103  CO2 27 29  GLUCOSE 120* 99  BUN 66* 29*  CREATININE 9.44* 6.13*  CALCIUM 8.3* 9.0   GFR Estimated Creatinine Clearance: 7.7 mL/min (A) (by C-G formula based on SCr of 6.13 mg/dL (H)). Liver Function Tests: No results for input(s): AST, ALT, ALKPHOS, BILITOT, PROT, ALBUMIN in the last 168 hours. No results for input(s): LIPASE, AMYLASE in the last 168 hours. No results for input(s): AMMONIA in the last 168 hours. Coagulation profile No results for input(s): INR, PROTIME in the last 168 hours.  CBC: Recent Labs  Lab 02/26/2019 0100 02/21/19 0619  WBC 7.2 4.5  NEUTROABS 5.5 2.4  HGB 9.7* 9.6*  HCT 30.9* 30.8*  MCV 100.3* 101.0*  PLT 145* 169   Cardiac Enzymes: No results for input(s): CKTOTAL, CKMB, CKMBINDEX, TROPONINI in the last 168 hours. BNP: Invalid input(s): POCBNP CBG: No results for input(s): GLUCAP in the last 168 hours. D-Dimer No results for input(s): DDIMER in the last 72 hours. Hgb A1c No results for input(s): HGBA1C in the last 72 hours. Lipid Profile No results for input(s): CHOL, HDL, LDLCALC, TRIG, CHOLHDL, LDLDIRECT in the last 72 hours. Thyroid function studies Recent Labs     02/26/2019 0551  TSH 3.291   Anemia work up No results for input(s): VITAMINB12, FOLATE, FERRITIN, TIBC, IRON, RETICCTPCT in the last 72 hours. Microbiology Recent Results (from the past 240 hour(s))  Respiratory Panel by RT PCR (Flu A&B, Covid) - Nasopharyngeal Swab     Status: None   Collection Time: 02/28/2019  1:42 AM   Specimen: Nasopharyngeal Swab  Result Value Ref Range Status   SARS Coronavirus 2 by RT PCR NEGATIVE NEGATIVE Final    Comment: (NOTE) SARS-CoV-2 target nucleic acids are NOT DETECTED. The SARS-CoV-2 RNA is generally detectable in upper respiratoy specimens during the acute phase of infection. The lowest concentration of SARS-CoV-2 viral copies this assay can detect is 131 copies/mL. A negative result does not preclude SARS-Cov-2 infection and should not be used as the sole basis for treatment or other patient management decisions. A negative result may occur with  improper specimen collection/handling, submission of specimen other than nasopharyngeal swab, presence of viral mutation(s) within the areas targeted by this assay, and inadequate number of viral copies (<131 copies/mL). A negative result must be combined with clinical observations, patient history, and  epidemiological information. The expected result is Negative. Fact Sheet for Patients:  PinkCheek.be Fact Sheet for Healthcare Providers:  GravelBags.it This test is not yet ap proved or cleared by the [place] FDA and  has been authorized for detection and/or diagnosis of SARS-CoV-2 by FDA under an Emergency Use Authorization (EUA). This EUA will remain  in effect (meaning this test can be used) for the duration of the COVID-19 declaration under Section 564(b)(1) of the Act, 21 U.S.C. section 360bbb-3(b)(1), unless the authorization is terminated or revoked sooner.    Influenza A by PCR NEGATIVE NEGATIVE Final   Influenza B by PCR NEGATIVE  NEGATIVE Final    Comment: (NOTE) The Xpert Xpress SARS-CoV-2/FLU/RSV assay is intended as an aid in  the diagnosis of influenza from Nasopharyngeal swab specimens and  should not be used as a sole basis for treatment. Nasal washings and  aspirates are unacceptable for Xpert Xpress SARS-CoV-2/FLU/RSV  testing. Fact Sheet for Patients: PinkCheek.be Fact Sheet for Healthcare Providers: GravelBags.it This test is not yet approved or cleared by the [place] FDA and  has been authorized for detection and/or diagnosis of SARS-CoV-2 by  FDA under an Emergency Use Authorization (EUA). This EUA will remain  in effect (meaning this test can be used) for the duration of the  Covid-19 declaration under Section 564(b)(1) of the Act, 21  U.S.C. section 360bbb-3(b)(1), unless the authorization is  terminated or revoked. Performed at [facility], [place] [address], [place], [place] [address]      Discharge Instructions:   Discharge Instructions    Call MD for:  persistant dizziness or light-headedness   Complete by: As directed    Call MD for:  severe uncontrolled pain   Complete by: As directed    Call MD for:  temperature >100.4   Complete by: As directed    Diet - low sodium heart healthy   Complete by: As directed    Discharge instructions   Complete by: As directed    Follow up with surgery today as scheduled in [place]. Continue regular dialysis schedule   Increase activity slowly   Complete by: As directed      Allergies as of 02/21/2019      Reactions   Sulfa Antibiotics Rash      Medication List    TAKE these medications   acetaminophen 500 MG tablet Commonly known as: TYLENOL Take 500-1,000 mg by mouth every 6 (six) hours as needed for mild pain or headache.   carvedilol 6.25 MG tablet Commonly known as: COREG Take 6.25 mg by mouth See admin instructions. 1 tab Tues Thurs and Sat; 1 twice daily all  other days   cyclophosphamide 25 MG capsule Commonly known as: CYTOXAN Take 25 mg by mouth daily.   dapsone 100 MG tablet Take 100 mg by mouth daily.   latanoprost 0.005 % ophthalmic solution Commonly known as: XALATAN Place 1 drop into both eyes at bedtime.   levothyroxine 125 MCG tablet Commonly known as: SYNTHROID Take 125 mcg by mouth daily before breakfast.   OVER THE COUNTER MEDICATION See admin instructions. Pt keeps sugar free candy or gum with her to keep her mouth from getting dry   pregabalin 50 MG capsule Commonly known as: LYRICA Take 1 capsule (50 mg total) by mouth at bedtime. What changed:   medication strength  how much to take  when to take this   Renal-Vite 0.8 MG Tabs Take 1 tablet by mouth daily.  traZODone 50 MG tablet Commonly known as: DESYREL Take 50 mg by mouth at bedtime.   Velphoro 500 MG chewable tablet Generic drug: sucroferric oxyhydroxide Chew 1,000 mg by mouth 3 (three) times daily with meals.         Time coordinating discharge: 45 minutes  Signed:  Radene Gunning NP  [facility] 02/21/2019, 8:59 AM

## 2019-02-21 NOTE — Progress Notes (Signed)
Pt went for walk test about 25 ft and maintained oxygen above 96% on room air. Pt denies SOB, dizziness, or pain.

## 2019-02-21 NOTE — Progress Notes (Signed)
[facility] Progress Note   Subjective:  Seen in room - no further dyspnea, plan for discharge today.  Objective Vitals:   02/14/2019 1530 02/21/2019 1550 02/25/2019 1736 02/13/2019 2149  BP: 135/76 122/87 (!) 124/92 139/61  Pulse: 64 66 68 86  Resp:  18 18 16   Temp:  98.2 F (36.8 C) 98.5 F (36.9 C) 98.7 F (37.1 C)  TempSrc:  Oral Oral Oral  SpO2:  98% 99% 91%  Weight:  63 kg    Height:       Physical Exam General: Well appearing, NAD Heart: RRR Lungs: CTAB Extremities: No LE edema   Dialysis Access: [facility] + AVF  Additional Objective Labs: Basic Metabolic Panel: Recent Labs  Lab 02/25/2019 0100 02/21/19 0619  NA 142 143  K 3.4* 4.3  CL 99 103  CO2 27 29  GLUCOSE 120* 99  BUN 66* 29*  CREATININE 9.44* 6.13*  CALCIUM 8.3* 9.0   CBC: Recent Labs  Lab 03/03/2019 0100 02/21/19 0619  WBC 7.2 4.5  NEUTROABS 5.5 2.4  HGB 9.7* 9.6*  HCT 30.9* 30.8*  MCV 100.3* 101.0*  PLT 145* 169   Studies/Results: DG Chest Port 1 View  Result Date: 02/08/2019 CLINICAL DATA:  Cough and shortness of breath. EXAM: PORTABLE CHEST 1 VIEW COMPARISON:  Chest CT 05/12/2018 FINDINGS: Right-sided dialysis catheter in place. Cardiomegaly. Interstitial and airspace opacities throughout the right lung and left lung base. Possible small pleural effusions. No pneumothorax. No acute osseous abnormalities are seen. IMPRESSION: 1. Cardiomegaly. 2. Heterogeneous interstitial and airspace opacities throughout both lungs, right greater than left. Findings may represent pulmonary edema or pneumonia, or combination thereof. Electronically Signed   By: Keith Rake M.D.   On: 02/12/2019 01:29   Medications: . sodium chloride    . sodium chloride    . sodium chloride     . calcitRIOL  0.75 mcg Oral Once per day on Tue Thu Sat  . Chlorhexidine Gluconate Cloth  6 each Topical Q0600  . cyclophosphamide  25 mg Oral Daily  . dapsone  100 mg Oral Daily  . heparin injection (subcutaneous)   5,000 Units Subcutaneous Q8H  . levothyroxine  125 mcg Oral QAC breakfast  . multivitamin  1 tablet Oral QHS  . pregabalin  50 mg Oral QHS  . sevelamer carbonate  1,600 mg Oral TID WC  . sodium chloride flush  3 mL Intravenous Q12H  . traZODone  50 mg Oral QHS    Dialysis Orders: TTS at [facility] 4:15hr, 400/800, EDW 65.5kg, 2K/2Ca, UFP #4, TDC + AVF, heparin 2400 units - Mircera 174mcg IV q 2 weeks (last 2/13) - Venofer 50mg  IV q week - Calcitriol 0.63mcg PO q HD - Binder: Velphoro v. Fosrenol?  Assessment/Plan: 1.  Acute Resp Failure: CXR with bilateral opacities - assuming pulm edema. Afebrile and WBC normal range, doubt pneumonia. S/p HD 2/16 - improved, will lower EDW. 2.  ESRD: Usual TTS sched - next 2/18 as outpt. 3. 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 No Hx pulm involvement nor hemoptysis. 4.  Hypertension/volume: BP better with volume correction with HD.  5.  Anemia: Hgb 9.6 - ESA recently given at outpt clinic - follow. 6.  Metabolic bone disease: Ca ok, Phos pending. Continue home binders. 7.  Hypothyroidism 8. Muscle twitching: ?Related to Lyrica - will reduce dose to 50mg  QHS.  Veneta Penton, PA-C 02/21/2019, 9:34 AM  [facility] Pager: [phone]   

## 2019-02-21 NOTE — Plan of Care (Signed)
Plan of care was reviewed with pt at bedside. Pt education was complete. Family will pick up pt. Safety measures in place. Call bell in reach. Pt stable at this time, will continue to monitor.

## 2019-02-21 NOTE — Telephone Encounter (Signed)
Pt called and said that she is having issues with her access. She said that she is having clots with they dialyze and she has been to the hospital as well.  Left message advising her that dialysis needs to evaluate and call our office to let us know what is going on. Explained that not all issues with accesses are handled here so we need to know what we need to do and if there are any studies that need to be ordered.   Selena Swaminathan Mindi Junker

## 2019-02-21 NOTE — Care Management Obs Status (Signed)
[place] NOTIFICATION   Patient Details  Name: Cristina Wilson MRN: [id number] Date of Birth: Sep 25, 1950   Medicare Observation Status Notification Given:  Yes    Bartholomew Crews, RN 02/21/2019, 9:45 AM

## 2019-02-24 DIAGNOSIS — D509 Iron deficiency anemia, unspecified: Secondary | ICD-10-CM | POA: Diagnosis not present

## 2019-02-24 DIAGNOSIS — D688 Other specified coagulation defects: Secondary | ICD-10-CM | POA: Diagnosis not present

## 2019-02-24 DIAGNOSIS — D631 Anemia in chronic kidney disease: Secondary | ICD-10-CM | POA: Diagnosis not present

## 2019-02-24 DIAGNOSIS — N2581 Secondary hyperparathyroidism of renal origin: Secondary | ICD-10-CM | POA: Diagnosis not present

## 2019-02-24 DIAGNOSIS — N186 End stage renal disease: Secondary | ICD-10-CM | POA: Diagnosis not present

## 2019-02-24 DIAGNOSIS — Z992 Dependence on renal dialysis: Secondary | ICD-10-CM | POA: Diagnosis not present

## 2019-02-24 DIAGNOSIS — M310 Hypersensitivity angiitis: Secondary | ICD-10-CM | POA: Diagnosis not present

## 2019-02-28 DIAGNOSIS — N186 End stage renal disease: Secondary | ICD-10-CM | POA: Diagnosis not present

## 2019-03-01 ENCOUNTER — Telehealth: Payer: Self-pay | Admitting: *Deleted

## 2019-03-01 NOTE — Telephone Encounter (Signed)
Our office received a fax today from Dr. Raul Del from [facility] at [place]. The fax is both for pre op clearance as well as a referral to our office (Dr. Irish Lack) per Dr. Raul Del. I will send a message to our scheduling team to please reach out to the pt with a New Pt appt. I will give the notes that were received today to [facility], CMA in our chart prep dept.   Surgery is set for 04/03/19 with Dr. Raul Del for Laparoscopic peritoneal dialysis catheter insert and omentopexy.

## 2019-03-04 DIAGNOSIS — Z992 Dependence on renal dialysis: Secondary | ICD-10-CM | POA: Diagnosis not present

## 2019-03-04 DIAGNOSIS — N019 Rapidly progressive nephritic syndrome with unspecified morphologic changes: Secondary | ICD-10-CM | POA: Diagnosis not present

## 2019-03-04 DIAGNOSIS — N186 End stage renal disease: Secondary | ICD-10-CM | POA: Diagnosis not present

## 2019-03-05 DIAGNOSIS — 419620001 Death: Secondary | SNOMED CT | POA: Diagnosis not present

## 2019-03-05 DEATH — deceased

## 2019-03-06 DIAGNOSIS — D631 Anemia in chronic kidney disease: Secondary | ICD-10-CM | POA: Diagnosis not present

## 2019-03-06 DIAGNOSIS — N2581 Secondary hyperparathyroidism of renal origin: Secondary | ICD-10-CM | POA: Diagnosis not present

## 2019-03-06 DIAGNOSIS — M310 Hypersensitivity angiitis: Secondary | ICD-10-CM | POA: Diagnosis not present

## 2019-03-06 DIAGNOSIS — Z992 Dependence on renal dialysis: Secondary | ICD-10-CM | POA: Diagnosis not present

## 2019-03-06 DIAGNOSIS — D688 Other specified coagulation defects: Secondary | ICD-10-CM | POA: Diagnosis not present

## 2019-03-06 DIAGNOSIS — D509 Iron deficiency anemia, unspecified: Secondary | ICD-10-CM | POA: Diagnosis not present

## 2019-03-06 DIAGNOSIS — N186 End stage renal disease: Secondary | ICD-10-CM | POA: Diagnosis not present

## 2019-03-07 DIAGNOSIS — H16223 Keratoconjunctivitis sicca, not specified as Sjogren's, bilateral: Secondary | ICD-10-CM | POA: Diagnosis not present

## 2019-03-07 DIAGNOSIS — H35372 Puckering of macula, left eye: Secondary | ICD-10-CM | POA: Diagnosis not present

## 2019-03-07 DIAGNOSIS — H401121 Primary open-angle glaucoma, left eye, mild stage: Secondary | ICD-10-CM | POA: Diagnosis not present

## 2019-03-07 DIAGNOSIS — H401112 Primary open-angle glaucoma, right eye, moderate stage: Secondary | ICD-10-CM | POA: Diagnosis not present

## 2019-03-07 DIAGNOSIS — H2513 Age-related nuclear cataract, bilateral: Secondary | ICD-10-CM | POA: Diagnosis not present

## 2019-03-07 DIAGNOSIS — G510 Bell's palsy: Secondary | ICD-10-CM | POA: Diagnosis not present

## 2019-03-07 DIAGNOSIS — H43811 Vitreous degeneration, right eye: Secondary | ICD-10-CM | POA: Diagnosis not present

## 2019-03-07 DIAGNOSIS — D3131 Benign neoplasm of right choroid: Secondary | ICD-10-CM | POA: Diagnosis not present

## 2019-03-13 DIAGNOSIS — J810 Acute pulmonary edema: Secondary | ICD-10-CM | POA: Diagnosis not present

## 2019-03-13 DIAGNOSIS — I10 Essential (primary) hypertension: Secondary | ICD-10-CM | POA: Diagnosis not present

## 2019-03-13 DIAGNOSIS — N186 End stage renal disease: Secondary | ICD-10-CM | POA: Diagnosis not present

## 2019-03-19 DIAGNOSIS — M1811 Unilateral primary osteoarthritis of first carpometacarpal joint, right hand: Secondary | ICD-10-CM | POA: Diagnosis not present

## 2019-03-19 DIAGNOSIS — M65331 Trigger finger, right middle finger: Secondary | ICD-10-CM | POA: Diagnosis not present

## 2019-03-19 DIAGNOSIS — M65311 Trigger thumb, right thumb: Secondary | ICD-10-CM | POA: Diagnosis not present

## 2019-03-21 DIAGNOSIS — Z452 Encounter for adjustment and management of vascular access device: Secondary | ICD-10-CM | POA: Diagnosis not present

## 2019-03-23 DIAGNOSIS — M1811 Unilateral primary osteoarthritis of first carpometacarpal joint, right hand: Secondary | ICD-10-CM | POA: Diagnosis not present

## 2019-03-23 DIAGNOSIS — M79644 Pain in right finger(s): Secondary | ICD-10-CM | POA: Diagnosis not present

## 2019-03-26 ENCOUNTER — Encounter: Payer: Self-pay | Admitting: Interventional Cardiology

## 2019-03-26 ENCOUNTER — Ambulatory Visit (INDEPENDENT_AMBULATORY_CARE_PROVIDER_SITE_OTHER): Payer: Medicare HMO | Admitting: Interventional Cardiology

## 2019-03-26 ENCOUNTER — Other Ambulatory Visit: Payer: Self-pay

## 2019-03-26 VITALS — BP 150/80 | HR 72 | Ht 64.0 in | Wt 134.2 lb

## 2019-03-26 DIAGNOSIS — I10 Essential (primary) hypertension: Secondary | ICD-10-CM

## 2019-03-26 DIAGNOSIS — Z01810 Encounter for preprocedural cardiovascular examination: Secondary | ICD-10-CM

## 2019-03-26 DIAGNOSIS — N186 End stage renal disease: Secondary | ICD-10-CM

## 2019-03-26 NOTE — Patient Instructions (Signed)
Medication Instructions:  Your physician recommends that you continue on your current medications as directed. Please refer to the Current Medication list given to you today.  *If you need a refill on your cardiac medications before your next appointment, please call your pharmacy*   Lab Work: None ordered  If you have labs (blood work) drawn today and your tests are completely normal, you will receive your results only by: Marland Kitchen MyChart Message (if you have MyChart) OR . A paper copy in the mail If you have any lab test that is abnormal or we need to change your treatment, we will call you to review the results.   Testing/Procedures: None ordered   Follow-Up: AS NEEDED   Other Instructions

## 2019-03-26 NOTE — Progress Notes (Signed)
Cardiology Office Note   Date:  03/26/2019   ID:  Kierre, Deines [birth year], MRN [id number]  PCP:  Lajean Manes, MD    No chief complaint on file.  Preoperative eval  Wt Readings from Last 3 Encounters:  03/26/19 134 lb 3.2 oz (60.9 kg)  02/19/2019 138 lb 14.2 oz (63 kg)  11/24/18 145 lb (65.8 kg)       History of Present Illness: Cristina Wilson is a 69 y.o. female who is being seen today for the evaluation of preoperative cardiovascular eval at the request of [place], Christiane Ha, MD.  Her mother, Cristina Wilson was a patient of mine back in the late 2000s.  Brother had hypertrophic cardiomyopathy and AFib.   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  She is looking to switch from hemodialysis to peritoneal dialysis and will need abdominal surgery.  Recently, she has had issues with trigger finger and is being managed with Dr. Fredna Dow.  She continues to walk and do some weights.  She will walk for 1 hour 30 minutes without any cardiac sx.  No problems going up hills.     Past Medical History:  Diagnosis Date  . Abdominal pain 06/06/2018  . Acute renal failure (ARF) ([place]) 06/22/2018  . Anemia in chronic kidney disease 08/25/2018  . Anemia, unspecified 07/05/2018  . 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 Anxiety 07/05/2018  . Arthritis   . Bell's palsy 2017   "mild case"  . Cerebral aneurysm 08/2014   pt. states she has 2 aneurysms  . Chronic low back pain 04/12/2017  . Chronic pain syndrome 04/12/2017  . Closed fracture of left distal radius   . De Quervain's tenosynovitis 02/10/2018  . Degeneration of lumbar intervertebral disc 03/01/2017  . Disease of thyroid gland 04/17/2018  . End stage renal disease ([place]) 08/25/2018  . End stage renal failure on dialysis [facility])    M/W/F on [employer]  . GERD (gastroesophageal reflux disease)   . 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 Headache 10/30/2014  .  Hyperlipidemia 04/17/2018  . Hypertension   . Hypothyroidism   . Hypoxemia 02/21/2019  . Iron deficiency anemia, unspecified 07/12/2018  . 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 PONV (postoperative nausea and vomiting)    violent vomiting  . Pulmonary edema 02/16/2019  . Referred otalgia of left ear 08/16/2018  . Sacral back pain 05/25/2016  . Scoliosis of lumbar spine 03/01/2017  . 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 Shortness of breath 07/06/2018  . 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 Thyroid disease   . Trigger finger of left thumb 02/10/2018    Past Surgical History:  Procedure Laterality Date  . ARTERY BIOPSY Right 10/31/2014   Procedure: BIOPSY TEMPORAL ARTERY-RIGHT;  Surgeon: Mal Misty, MD;  Location: [place];  Service: Vascular;  Laterality: Right;  . AV FISTULA PLACEMENT Left 10/11/2018   Procedure: left arm ARTERIOVENOUS (AV) FISTULA  creation;  Surgeon: Waynetta Sandy, MD;  Location: [place];  Service: Vascular;  Laterality: Left;  . BILATERAL CARPAL TUNNEL RELEASE    . BREAST SURGERY Left    lumpectomy  . BUNIONECTOMY Right   . CHOLECYSTECTOMY N/A 10/13/2016   Procedure: LAPAROSCOPIC CHOLECYSTECTOMY WITH INTRAOPERATIVE CHOLANGIOGRAM;  Surgeon: Donnie Mesa, MD;  Location: [place];  Service: General;  Laterality: N/A;  . EYE SURGERY     surgery for cross eye  . FOOT FRACTURE SURGERY    .  OPEN REDUCTION INTERNAL FIXATION (ORIF) DISTAL RADIAL FRACTURE Left 03/29/2017   Procedure: OPEN REDUCTION INTERNAL FIXATION (ORIF)LEFT  DISTAL RADIAL FRACTURE;  Surgeon: Leanora Cover, MD;  Location: [place];  Service: Orthopedics;  Laterality: Left;  . THYROIDECTOMY    . TONSILLECTOMY    . WISDOM TOOTH EXTRACTION       Current Outpatient Medications  Medication Sig Dispense Refill  . acetaminophen (TYLENOL) 500 MG tablet Take 500-1,000 mg by mouth every 6 (six) hours as needed for mild pain or headache.      . B Complex-C-Folic Acid (RENAL-VITE) 0.8 MG TABS Take 1 tablet by mouth daily.     . carvedilol (COREG) 6.25 MG tablet Take 6.25 mg by mouth See admin instructions. 1 tab Tues Thurs and Sat; 1 twice daily all other days    . cyclophosphamide (CYTOXAN) 25 MG capsule Take 25 mg by mouth daily.     . dapsone 100 MG tablet Take 100 mg by mouth daily.    Marland Kitchen latanoprost (XALATAN) 0.005 % ophthalmic solution Place 1 drop into both eyes at bedtime.    Marland Kitchen levothyroxine (SYNTHROID, LEVOTHROID) 125 MCG tablet Take 125 mcg by mouth daily before breakfast.     . OVER THE COUNTER MEDICATION See admin instructions. Pt keeps sugar free candy or gum with her to keep her mouth from getting dry    . pantoprazole (PROTONIX) 40 MG tablet Take by mouth.    . pregabalin (LYRICA) 50 MG capsule Take 1 capsule (50 mg total) by mouth at bedtime. 30 capsule 0  . sucroferric oxyhydroxide (VELPHORO) 500 MG chewable tablet Chew 1,000 mg by mouth 3 (three) times daily with meals.    . traZODone (DESYREL) 50 MG tablet Take 50 mg by mouth at bedtime.      No current facility-administered medications for this visit.    Allergies:   Sulfa antibiotics    Social History:  The patient  reports that she quit smoking about 29 years ago. Her smoking use included cigarettes. She quit after 0.50 years of use. She has never used smokeless tobacco. She reports current alcohol use of about 7.0 standard drinks of alcohol per week. She reports previous drug use. Drug: Marijuana.   Family History:  The patient's family history includes COPD in her mother; Cancer in her father; Heart disease in her mother; Kidney disease in her mother.    ROS:  Please see the history of present illness.   Otherwise, review of systems are positive for need for surgery.   All other systems are reviewed and negative.    PHYSICAL EXAM: VS:  BP (!) 150/80   Pulse 72   Ht 5\' 4"  (1.626 m)   Wt 134 lb 3.2 oz (60.9 kg)   SpO2 94%   BMI 23.04 kg/m  , BMI Body  mass index is 23.04 kg/m. GEN: Well nourished, well developed, in no acute distress  HEENT: normal  Neck: no JVD, 1/6 left carotid bruit- likely related to left arm fistula, no masses Cardiac: RRR; early 2/6 systolic murmur, no rubs, or gallops,no edema  Respiratory:  clear to auscultation bilaterally, normal work of breathing GI: soft, nontender, nondistended, + BS MS: no deformity or atrophy, left arm fistula  Skin: warm and dry, no rash Neuro:  Strength and sensation are intact Psych: euthymic mood, full affect   EKG:   The ekg ordered in 02/2019 demonstrates NSR, LVH   Recent Labs: 02/22/2019: B Natriuretic Peptide 1,154.7; TSH 3.291 02/21/2019: BUN  29; Creatinine, Ser 6.13; Hemoglobin 9.6; Platelets 169; Potassium 4.3; Sodium 143   Lipid Panel No results found for: CHOL, TRIG, HDL, CHOLHDL, VLDL, LDLCALC, LDLDIRECT   Other studies Reviewed: Additional studies/ records that were reviewed today with results demonstrating: labs from PMD reviewed.   ASSESSMENT AND PLAN:  1. Preoperative cardiovascular eval: No further cardiac testing needed before surgery.  SHe is quite active with walking and weights and has no cardiac sx.  She easily completes 4 METS without sx.  Continue healthy lifestyle and RF modification.  2. ESRD: Trying for peritoneal dialysis. Diet is limited to minimize phosphorus.   3. HTN: Varying doses of Coreg. 4. Hyperlipidemia:  Discussed between Dr. Felipa Eth and Dr. Jimmy Footman per the patient.  Lipid lowering therapy was thought to not be indicated.   Current medicines are reviewed at length with the patient today.  The patient concerns regarding her medicines were addressed.  The following changes have been made:  No change  Labs/ tests ordered today include:  No orders of the defined types were placed in this encounter.   Recommend 150 minutes/week of aerobic exercise Low fat, low carb, high fiber diet recommended  Disposition:   FU as  needed   Signed, Larae Grooms, MD  03/26/2019 10:20 AM    [address], [place], [place] [place] [employer]  [address] Phone: [phone]; Fax: [phone]

## 2019-03-28 DIAGNOSIS — Z20828 Contact with and (suspected) exposure to other viral communicable diseases: Secondary | ICD-10-CM | POA: Diagnosis not present

## 2019-03-28 DIAGNOSIS — Z20822 Contact with and (suspected) exposure to covid-19: Secondary | ICD-10-CM | POA: Diagnosis not present

## 2019-03-30 DIAGNOSIS — Z01818 Encounter for other preprocedural examination: Secondary | ICD-10-CM | POA: Diagnosis not present

## 2019-03-30 DIAGNOSIS — M65311 Trigger thumb, right thumb: Secondary | ICD-10-CM | POA: Diagnosis not present

## 2019-03-30 DIAGNOSIS — M65331 Trigger finger, right middle finger: Secondary | ICD-10-CM | POA: Diagnosis not present

## 2019-03-30 DIAGNOSIS — M1811 Unilateral primary osteoarthritis of first carpometacarpal joint, right hand: Secondary | ICD-10-CM | POA: Diagnosis not present

## 2019-04-03 DIAGNOSIS — N186 End stage renal disease: Secondary | ICD-10-CM | POA: Diagnosis not present

## 2019-04-03 DIAGNOSIS — I120 Hypertensive chronic kidney disease with stage 5 chronic kidney disease or end stage renal disease: Secondary | ICD-10-CM | POA: Diagnosis not present

## 2019-04-03 DIAGNOSIS — Z992 Dependence on renal dialysis: Secondary | ICD-10-CM | POA: Diagnosis not present

## 2019-04-03 DIAGNOSIS — K660 Peritoneal adhesions (postprocedural) (postinfection): Secondary | ICD-10-CM | POA: Diagnosis not present

## 2019-04-03 DIAGNOSIS — K669 Disorder of peritoneum, unspecified: Secondary | ICD-10-CM | POA: Diagnosis not present

## 2019-04-04 DIAGNOSIS — N019 Rapidly progressive nephritic syndrome with unspecified morphologic changes: Secondary | ICD-10-CM | POA: Diagnosis not present

## 2019-04-04 DIAGNOSIS — Z992 Dependence on renal dialysis: Secondary | ICD-10-CM | POA: Diagnosis not present

## 2019-04-04 DIAGNOSIS — N186 End stage renal disease: Secondary | ICD-10-CM | POA: Diagnosis not present

## 2019-04-05 DIAGNOSIS — N2581 Secondary hyperparathyroidism of renal origin: Secondary | ICD-10-CM | POA: Diagnosis not present

## 2019-04-05 DIAGNOSIS — D688 Other specified coagulation defects: Secondary | ICD-10-CM | POA: Diagnosis not present

## 2019-04-05 DIAGNOSIS — Z992 Dependence on renal dialysis: Secondary | ICD-10-CM | POA: Diagnosis not present

## 2019-04-05 DIAGNOSIS — D509 Iron deficiency anemia, unspecified: Secondary | ICD-10-CM | POA: Diagnosis not present

## 2019-04-05 DIAGNOSIS — N186 End stage renal disease: Secondary | ICD-10-CM | POA: Diagnosis not present

## 2019-04-12 DIAGNOSIS — M5441 Lumbago with sciatica, right side: Secondary | ICD-10-CM | POA: Diagnosis not present

## 2019-04-12 DIAGNOSIS — M461 Sacroiliitis, not elsewhere classified: Secondary | ICD-10-CM | POA: Diagnosis not present

## 2019-04-12 DIAGNOSIS — G8929 Other chronic pain: Secondary | ICD-10-CM | POA: Diagnosis not present

## 2019-04-12 DIAGNOSIS — I10 Essential (primary) hypertension: Secondary | ICD-10-CM | POA: Diagnosis not present

## 2019-04-12 DIAGNOSIS — N186 End stage renal disease: Secondary | ICD-10-CM | POA: Diagnosis not present

## 2019-04-12 DIAGNOSIS — M5416 Radiculopathy, lumbar region: Secondary | ICD-10-CM | POA: Diagnosis not present

## 2019-04-16 DIAGNOSIS — N186 End stage renal disease: Secondary | ICD-10-CM | POA: Diagnosis not present

## 2019-04-16 DIAGNOSIS — R82998 Other abnormal findings in urine: Secondary | ICD-10-CM | POA: Diagnosis not present

## 2019-04-16 DIAGNOSIS — Z4932 Encounter for adequacy testing for peritoneal dialysis: Secondary | ICD-10-CM | POA: Diagnosis not present

## 2019-04-16 DIAGNOSIS — D509 Iron deficiency anemia, unspecified: Secondary | ICD-10-CM | POA: Diagnosis not present

## 2019-04-16 DIAGNOSIS — M310 Hypersensitivity angiitis: Secondary | ICD-10-CM | POA: Diagnosis not present

## 2019-04-16 DIAGNOSIS — D631 Anemia in chronic kidney disease: Secondary | ICD-10-CM | POA: Diagnosis not present

## 2019-04-16 DIAGNOSIS — Z992 Dependence on renal dialysis: Secondary | ICD-10-CM | POA: Diagnosis not present

## 2019-04-16 DIAGNOSIS — N2581 Secondary hyperparathyroidism of renal origin: Secondary | ICD-10-CM | POA: Diagnosis not present

## 2019-04-16 DIAGNOSIS — K769 Liver disease, unspecified: Secondary | ICD-10-CM | POA: Diagnosis not present

## 2019-04-16 DIAGNOSIS — N2589 Other disorders resulting from impaired renal tubular function: Secondary | ICD-10-CM | POA: Diagnosis not present

## 2019-04-27 ENCOUNTER — Emergency Department (HOSPITAL_COMMUNITY)
Admission: EM | Admit: 2019-04-27 | Discharge: 2019-04-27 | Disposition: A | Discharge status: Home / Self Care | Payer: Medicare HMO | Source: Home / Self Care | Attending: Emergency Medicine | Admitting: Emergency Medicine

## 2019-04-27 ENCOUNTER — Other Ambulatory Visit: Payer: Self-pay

## 2019-04-27 ENCOUNTER — Inpatient Hospital Stay (HOSPITAL_COMMUNITY)
Admission: EM | Admit: 2019-04-27 | Discharge: 2019-04-29 | DRG: 070 | Disposition: A | Discharge status: Home / Self Care | Payer: Medicare HMO | Source: Ambulatory Visit | Attending: Internal Medicine | Admitting: Internal Medicine

## 2019-04-27 ENCOUNTER — Encounter (HOSPITAL_COMMUNITY): Payer: Self-pay

## 2019-04-27 DIAGNOSIS — G4489 Other headache syndrome: Secondary | ICD-10-CM | POA: Diagnosis not present

## 2019-04-27 DIAGNOSIS — R4589 Other symptoms and signs involving emotional state: Secondary | ICD-10-CM

## 2019-04-27 DIAGNOSIS — R531 Weakness: Secondary | ICD-10-CM | POA: Diagnosis not present

## 2019-04-27 DIAGNOSIS — Z87891 Personal history of nicotine dependence: Secondary | ICD-10-CM

## 2019-04-27 DIAGNOSIS — K219 Gastro-esophageal reflux disease without esophagitis: Secondary | ICD-10-CM | POA: Diagnosis present

## 2019-04-27 DIAGNOSIS — G9349 Other encephalopathy: Secondary | ICD-10-CM | POA: Diagnosis not present

## 2019-04-27 DIAGNOSIS — Z841 Family history of disorders of kidney and ureter: Secondary | ICD-10-CM

## 2019-04-27 DIAGNOSIS — Z992 Dependence on renal dialysis: Secondary | ICD-10-CM

## 2019-04-27 DIAGNOSIS — N186 End stage renal disease: Secondary | ICD-10-CM | POA: Diagnosis present

## 2019-04-27 DIAGNOSIS — I953 Hypotension of hemodialysis: Secondary | ICD-10-CM | POA: Diagnosis present

## 2019-04-27 DIAGNOSIS — R0902 Hypoxemia: Secondary | ICD-10-CM | POA: Diagnosis not present

## 2019-04-27 DIAGNOSIS — Z20822 Contact with and (suspected) exposure to covid-19: Secondary | ICD-10-CM | POA: Diagnosis present

## 2019-04-27 DIAGNOSIS — G8929 Other chronic pain: Secondary | ICD-10-CM | POA: Diagnosis present

## 2019-04-27 DIAGNOSIS — K2970 Gastritis, unspecified, without bleeding: Secondary | ICD-10-CM | POA: Diagnosis present

## 2019-04-27 DIAGNOSIS — E875 Hyperkalemia: Secondary | ICD-10-CM | POA: Diagnosis not present

## 2019-04-27 DIAGNOSIS — Z825 Family history of asthma and other chronic lower respiratory diseases: Secondary | ICD-10-CM

## 2019-04-27 DIAGNOSIS — Z882 Allergy status to sulfonamides status: Secondary | ICD-10-CM

## 2019-04-27 DIAGNOSIS — M545 Low back pain: Secondary | ICD-10-CM | POA: Diagnosis present

## 2019-04-27 DIAGNOSIS — Z79890 Hormone replacement therapy (postmenopausal): Secondary | ICD-10-CM

## 2019-04-27 DIAGNOSIS — Z8669 Personal history of other diseases of the nervous system and sense organs: Secondary | ICD-10-CM

## 2019-04-27 DIAGNOSIS — N2581 Secondary hyperparathyroidism of renal origin: Secondary | ICD-10-CM | POA: Diagnosis present

## 2019-04-27 DIAGNOSIS — F419 Anxiety disorder, unspecified: Secondary | ICD-10-CM | POA: Diagnosis present

## 2019-04-27 DIAGNOSIS — M310 Hypersensitivity angiitis: Secondary | ICD-10-CM | POA: Diagnosis present

## 2019-04-27 DIAGNOSIS — R404 Transient alteration of awareness: Secondary | ICD-10-CM

## 2019-04-27 DIAGNOSIS — Z8249 Family history of ischemic heart disease and other diseases of the circulatory system: Secondary | ICD-10-CM

## 2019-04-27 DIAGNOSIS — N19 Unspecified kidney failure: Secondary | ICD-10-CM | POA: Diagnosis present

## 2019-04-27 DIAGNOSIS — I120 Hypertensive chronic kidney disease with stage 5 chronic kidney disease or end stage renal disease: Secondary | ICD-10-CM | POA: Diagnosis present

## 2019-04-27 DIAGNOSIS — F418 Other specified anxiety disorders: Secondary | ICD-10-CM

## 2019-04-27 DIAGNOSIS — G9341 Metabolic encephalopathy: Principal | ICD-10-CM | POA: Diagnosis present

## 2019-04-27 DIAGNOSIS — R69 Illness, unspecified: Secondary | ICD-10-CM | POA: Diagnosis not present

## 2019-04-27 DIAGNOSIS — D62 Acute posthemorrhagic anemia: Secondary | ICD-10-CM | POA: Diagnosis present

## 2019-04-27 DIAGNOSIS — D631 Anemia in chronic kidney disease: Secondary | ICD-10-CM | POA: Diagnosis present

## 2019-04-27 DIAGNOSIS — E890 Postprocedural hypothyroidism: Secondary | ICD-10-CM | POA: Diagnosis present

## 2019-04-27 DIAGNOSIS — R778 Other specified abnormalities of plasma proteins: Secondary | ICD-10-CM | POA: Diagnosis present

## 2019-04-27 DIAGNOSIS — R519 Headache, unspecified: Secondary | ICD-10-CM | POA: Diagnosis present

## 2019-04-27 DIAGNOSIS — I10 Essential (primary) hypertension: Secondary | ICD-10-CM | POA: Diagnosis not present

## 2019-04-27 DIAGNOSIS — K317 Polyp of stomach and duodenum: Secondary | ICD-10-CM | POA: Diagnosis present

## 2019-04-27 DIAGNOSIS — R001 Bradycardia, unspecified: Secondary | ICD-10-CM | POA: Diagnosis present

## 2019-04-27 DIAGNOSIS — I160 Hypertensive urgency: Secondary | ICD-10-CM | POA: Diagnosis present

## 2019-04-27 DIAGNOSIS — D696 Thrombocytopenia, unspecified: Secondary | ICD-10-CM | POA: Diagnosis present

## 2019-04-27 DIAGNOSIS — Z79899 Other long term (current) drug therapy: Secondary | ICD-10-CM

## 2019-04-27 LAB — BASIC METABOLIC PANEL
Anion gap: 23 — ABNORMAL HIGH (ref 5–15)
Anion gap: 22 — ABNORMAL HIGH (ref 5–15)
BUN: 162 mg/dL — ABNORMAL HIGH (ref 8–23)
BUN: 162 mg/dL — ABNORMAL HIGH (ref 8–23)
CO2: 19 mmol/L — ABNORMAL LOW (ref 22–32)
CO2: 20 mmol/L — ABNORMAL LOW (ref 22–32)
Calcium: 8.7 mg/dL — ABNORMAL LOW (ref 8.9–10.3)
Calcium: 8.6 mg/dL — ABNORMAL LOW (ref 8.9–10.3)
Chloride: 97 mmol/L — ABNORMAL LOW (ref 98–111)
Chloride: 97 mmol/L — ABNORMAL LOW (ref 98–111)
Creatinine, Ser: 21.44 mg/dL — ABNORMAL HIGH (ref 0.44–1.00)
Creatinine, Ser: 21.78 mg/dL — ABNORMAL HIGH (ref 0.44–1.00)
GFR calc Af Amer: 2 mL/min — ABNORMAL LOW (ref >60)
GFR calc Af Amer: 2 mL/min — ABNORMAL LOW (ref >60)
GFR calc non Af Amer: 1 mL/min — ABNORMAL LOW (ref >60)
GFR calc non Af Amer: 1 mL/min — ABNORMAL LOW (ref >60)
Glucose, Bld: 97 mg/dL (ref 70–99)
Glucose, Bld: 134 mg/dL — ABNORMAL HIGH (ref 70–99)
Potassium: 5.5 mmol/L — ABNORMAL HIGH (ref 3.5–5.1)
Potassium: 5.6 mmol/L — ABNORMAL HIGH (ref 3.5–5.1)
Sodium: 139 mmol/L (ref 135–145)
Sodium: 139 mmol/L (ref 135–145)

## 2019-04-27 LAB — CBC
HCT: 23 % — ABNORMAL LOW (ref 36.0–46.0)
Hemoglobin: 7.3 g/dL — ABNORMAL LOW (ref 12.0–15.0)
MCH: 32 pg (ref 26.0–34.0)
MCHC: 31.7 g/dL (ref 30.0–36.0)
MCV: 100.9 fL — ABNORMAL HIGH (ref 80.0–100.0)
Platelets: 136 10*3/uL — ABNORMAL LOW (ref 150–400)
RBC: 2.28 MIL/uL — ABNORMAL LOW (ref 3.87–5.11)
RDW: 14.7 % (ref 11.5–15.5)
WBC: 5.4 10*3/uL (ref 4.0–10.5)
nRBC: 1.1 % — ABNORMAL HIGH (ref 0.0–0.2)

## 2019-04-27 LAB — RESPIRATORY PANEL BY RT PCR (FLU A&B, COVID)
Influenza A by PCR: NEGATIVE
Influenza B by PCR: NEGATIVE
SARS Coronavirus 2 by RT PCR: NEGATIVE

## 2019-04-27 LAB — TROPONIN I (HIGH SENSITIVITY)
Troponin I (High Sensitivity): 49 ng/L — ABNORMAL HIGH (ref <18)
Troponin I (High Sensitivity): 55 ng/L — ABNORMAL HIGH (ref <18)

## 2019-04-27 LAB — POC OCCULT BLOOD, ED: Fecal Occult Bld: NEGATIVE

## 2019-04-27 LAB — CBG MONITORING, ED: Glucose-Capillary: 84 mg/dL (ref 70–99)

## 2019-04-27 MED ORDER — ACETAMINOPHEN 325 MG PO TABS
650.0000 mg | ORAL_TABLET | Freq: Four times a day (QID) | ORAL | Status: DC | PRN
  Administered 2019-04-28: 650 mg via ORAL
  Filled 2019-04-27: qty 2

## 2019-04-27 MED ORDER — SODIUM CHLORIDE 0.9% FLUSH: 3.0000 mL | Freq: Once | INTRAVENOUS | Status: DC

## 2019-04-27 MED ORDER — LIDOCAINE HCL (PF) 1 % IJ SOLN: 5.0000 mL | INTRAMUSCULAR | Status: DC | PRN

## 2019-04-27 MED ORDER — CARVEDILOL 12.5 MG PO TABS
12.5000 mg | ORAL_TABLET | Freq: Two times a day (BID) | ORAL | Status: DC
  Administered 2019-04-28: 12.5 mg via ORAL
  Filled 2019-04-27: qty 1

## 2019-04-27 MED ORDER — CARVEDILOL 12.5 MG PO TABS
12.5000 mg | ORAL_TABLET | Freq: Once | ORAL | Status: AC
  Administered 2019-04-27: 09:00:00 12.5 mg via ORAL
  Filled 2019-04-27: qty 1

## 2019-04-27 MED ORDER — LIDOCAINE-PRILOCAINE 2.5-2.5 % EX CREA
1.0000 "application " | TOPICAL_CREAM | CUTANEOUS | Status: DC | PRN
  Filled 2019-04-27: qty 5

## 2019-04-27 MED ORDER — HEPARIN SODIUM (PORCINE) 1000 UNIT/ML DIALYSIS
1000.0000 [IU] | INTRAMUSCULAR | Status: DC | PRN
  Filled 2019-04-27: qty 1

## 2019-04-27 MED ORDER — LORAZEPAM 2 MG/ML IJ SOLN
0.5000 mg | Freq: Once | INTRAMUSCULAR | Status: AC
  Administered 2019-04-27: 0.5 mg via INTRAVENOUS
  Filled 2019-04-27: qty 1

## 2019-04-27 MED ORDER — LEVALBUTEROL HCL 0.63 MG/3ML IN NEBU: 0.6300 mg | INHALATION_SOLUTION | Freq: Four times a day (QID) | RESPIRATORY_TRACT | Status: DC | PRN

## 2019-04-27 MED ORDER — SENNA 8.6 MG PO TABS
1.0000 | ORAL_TABLET | Freq: Two times a day (BID) | ORAL | Status: DC
  Administered 2019-04-28 – 2019-04-29 (×4): 8.6 mg via ORAL
  Filled 2019-04-27 (×4): qty 1

## 2019-04-27 MED ORDER — ONDANSETRON HCL 4 MG PO TABS: 4.0000 mg | ORAL_TABLET | Freq: Four times a day (QID) | ORAL | Status: DC | PRN

## 2019-04-27 MED ORDER — CYCLOPHOSPHAMIDE 25 MG PO CAPS
25.0000 mg | ORAL_CAPSULE | Freq: Every day | ORAL | Status: DC
  Administered 2019-04-29: 25 mg via ORAL
  Filled 2019-04-27 (×3): qty 1

## 2019-04-27 MED ORDER — SUCROFERRIC OXYHYDROXIDE 500 MG PO CHEW
1000.0000 mg | CHEWABLE_TABLET | Freq: Three times a day (TID) | ORAL | Status: DC
  Administered 2019-04-28 – 2019-04-29 (×2): 1000 mg via ORAL
  Filled 2019-04-27 (×6): qty 2

## 2019-04-27 MED ORDER — ONDANSETRON HCL 4 MG/2ML IJ SOLN: 4.0000 mg | Freq: Four times a day (QID) | INTRAMUSCULAR | Status: DC | PRN

## 2019-04-27 MED ORDER — SODIUM CHLORIDE 0.9 % IV SOLN: 100.0000 mL | INTRAVENOUS | Status: DC | PRN

## 2019-04-27 MED ORDER — CHLORHEXIDINE GLUCONATE CLOTH 2 % EX PADS
6.0000 | MEDICATED_PAD | Freq: Every day | CUTANEOUS | Status: DC
  Administered 2019-04-28 – 2019-04-29 (×2): 6 via TOPICAL

## 2019-04-27 MED ORDER — ACETAMINOPHEN 650 MG RE SUPP: 650.0000 mg | Freq: Four times a day (QID) | RECTAL | Status: DC | PRN

## 2019-04-27 MED ORDER — LATANOPROST 0.005 % OP SOLN
1.0000 [drp] | Freq: Every day | OPHTHALMIC | Status: DC
  Administered 2019-04-28 (×2): 1 [drp] via OPHTHALMIC
  Filled 2019-04-27 (×2): qty 2.5

## 2019-04-27 MED ORDER — SEVELAMER CARBONATE 800 MG PO TABS
2400.0000 mg | ORAL_TABLET | Freq: Three times a day (TID) | ORAL | Status: DC
  Administered 2019-04-28 – 2019-04-29 (×2): 2400 mg via ORAL
  Filled 2019-04-27 (×6): qty 3

## 2019-04-27 MED ORDER — HEPARIN SODIUM (PORCINE) 5000 UNIT/ML IJ SOLN
5000.0000 [IU] | Freq: Three times a day (TID) | INTRAMUSCULAR | Status: DC
  Administered 2019-04-28 (×2): 5000 [IU] via SUBCUTANEOUS
  Filled 2019-04-27 (×2): qty 1

## 2019-04-27 MED ORDER — LEVOTHYROXINE SODIUM 25 MCG PO TABS
137.0000 ug | ORAL_TABLET | Freq: Every day | ORAL | Status: DC
  Administered 2019-04-28 – 2019-04-29 (×2): 137 ug via ORAL
  Filled 2019-04-27 (×2): qty 1

## 2019-04-27 MED ORDER — SODIUM CHLORIDE 0.9% FLUSH
3.0000 mL | Freq: Two times a day (BID) | INTRAVENOUS | Status: DC
  Administered 2019-04-28 – 2019-04-29 (×4): 3 mL via INTRAVENOUS

## 2019-04-27 MED ORDER — LORAZEPAM 2 MG/ML IJ SOLN
0.5000 mg | Freq: Once | INTRAMUSCULAR | Status: AC
  Administered 2019-04-27: 09:00:00 0.5 mg via INTRAVENOUS
  Filled 2019-04-27: qty 1

## 2019-04-27 MED ORDER — ACETAMINOPHEN 500 MG PO TABS
1000.0000 mg | ORAL_TABLET | Freq: Once | ORAL | Status: AC
  Administered 2019-04-27: 1000 mg via ORAL
  Filled 2019-04-27: qty 2

## 2019-04-27 MED ORDER — ALTEPLASE 2 MG IJ SOLR: 2.0000 mg | Freq: Once | INTRAMUSCULAR | Status: DC | PRN

## 2019-04-27 MED ORDER — ALBUMIN HUMAN 25 % IV SOLN: 25.0000 g | Freq: Once | INTRAVENOUS | Status: DC

## 2019-04-27 MED ORDER — PENTAFLUOROPROP-TETRAFLUOROETH EX AERO
1.0000 "application " | INHALATION_SPRAY | CUTANEOUS | Status: DC | PRN
  Filled 2019-04-27: qty 116

## 2019-04-27 NOTE — H&P (Signed)
[facility] History and Physical  Cristina Wilson PJA:[id number] DOB: 05/13/1950 DOA: 04/27/2019 PCP: Lajean Manes, MD  Admitted from: Home Chief Complaint: Generalized shaking, anxiety  History of Present Illness: Cristina Wilson is a 69 y.o. female with PMH significant for Goodpasture's syndrome, ESRD on dialysis MWF, HTN, HLD, anxiety disorder, chronic anemia, chronic low back pain.  At her usual state of health, patient is physically functional and cognitively intact. Patient presented to the ED early this morning with complaint of uncontrolled shaking.   Patient states she began shaking last night as if she were cold and the shaking has been persistent since it began.  She did not sleep well at all and it worsened towards the morning, also associated with frontal headache and hence she called the EMS.  She has chronic anxiety disorder and takes medications at home.   2 weeks ago, patient transitioned from hemodialysis to peritoneal dialysis.  For last 2 weeks, she got daily peritoneal dialysis during the day 5 days a week.  The fear of infection a/w peritoneal dialysis apparently has increased patient's anxiety significantly. On 4/12, patient's PCP started her on BuSpar twice daily.  She did not like the way it made her feel loopy during the day.  So she started taking both tablets at night with her trazodone.  She stopped taking Ativan when she started buspirone.  In her first presentation in the ED, patient was hypertensive, afebrile, anxious and tremulous on exam.  She was given IV Ativan after which she came down.. Labs showed potassium level elevated to 5.5 without EKG changes. She was due for hemodialysis at 1 PM and hence was discharged to the dialysis center. Patient reports to me that at the dialysis center, she was noted to be shaking again and could not complete the treatment and hence referred back to ED. Repeat labs this afternoon shows potassium level at  5.6, troponin level elevated to 49.  Hemoglobin dropped from 9.6 earlier to 7.3. BUN/creatinine 162/21.78. ED physician called nephrologist on-call Dr. Joelyn Oms who suggested to keep the patient in the hospital.  Patient was started on dialysis tonight.   Hospitalist service was called for admission and management.  Review of Systems:  All systems were reviewed and were negative unless otherwise mentioned in the HPI   Past medical history: Past Medical History:  Diagnosis Date  . Abdominal pain 06/06/2018  . Acute renal failure (ARF) ([place]) 06/22/2018  . Anemia in chronic kidney disease 08/25/2018  . Anemia, unspecified 07/05/2018  . 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 Anxiety 07/05/2018  . Arthritis   . Bell's palsy 2017   "mild case"  . Cerebral aneurysm 08/2014   pt. states she has 2 aneurysms  . Chronic low back pain 04/12/2017  . Chronic pain syndrome 04/12/2017  . Closed fracture of left distal radius   . De Quervain's tenosynovitis 02/10/2018  . Degeneration of lumbar intervertebral disc 03/01/2017  . Disease of thyroid gland 04/17/2018  . End stage renal disease ([place]) 08/25/2018  . End stage renal failure on dialysis [facility])    M/W/F on [employer]  . GERD (gastroesophageal reflux disease)   . Goodpasture syndrome ([place])   . Headache 10/30/2014  . Hyperlipidemia 04/17/2018  . Hypertension   . Hypothyroidism   . Hypoxemia 02/21/2019  . Iron deficiency anemia, unspecified 07/12/2018  . 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 PONV (postoperative nausea and vomiting)    violent vomiting  . Pulmonary edema  02/23/2019  . Referred otalgia of left ear 08/16/2018  . Sacral back pain 05/25/2016  . Scoliosis of lumbar spine 03/01/2017  . Secondary hyperparathyroidism of renal origin ([employer]) 08/22/2018  . Shortness of breath 07/06/2018  . 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 Thyroid disease   . Trigger finger of left thumb 02/10/2018    Past  surgical history: Past Surgical History:  Procedure Laterality Date  . ARTERY BIOPSY Right 10/31/2014   Procedure: BIOPSY TEMPORAL ARTERY-RIGHT;  Surgeon: Mal Misty, MD;  Location: [place];  Service: Vascular;  Laterality: Right;  . AV FISTULA PLACEMENT Left 10/11/2018   Procedure: left arm ARTERIOVENOUS (AV) FISTULA  creation;  Surgeon: Waynetta Sandy, MD;  Location: [place];  Service: Vascular;  Laterality: Left;  . BILATERAL CARPAL TUNNEL RELEASE    . BREAST SURGERY Left    lumpectomy  . BUNIONECTOMY Right   . CHOLECYSTECTOMY N/A 10/13/2016   Procedure: LAPAROSCOPIC CHOLECYSTECTOMY WITH INTRAOPERATIVE CHOLANGIOGRAM;  Surgeon: Donnie Mesa, MD;  Location: [place];  Service: General;  Laterality: N/A;  . EYE SURGERY     surgery for cross eye  . FOOT FRACTURE SURGERY    . OPEN REDUCTION INTERNAL FIXATION (ORIF) DISTAL RADIAL FRACTURE Left 03/29/2017   Procedure: OPEN REDUCTION INTERNAL FIXATION (ORIF)LEFT  DISTAL RADIAL FRACTURE;  Surgeon: Leanora Cover, MD;  Location: [place];  Service: Orthopedics;  Laterality: Left;  . THYROIDECTOMY    . TONSILLECTOMY    . WISDOM TOOTH EXTRACTION      Social History:  reports that she quit smoking about 29 years ago. Her smoking use included cigarettes. She quit after 0.50 years of use. She has never used smokeless tobacco. She reports current alcohol use of about 7.0 standard drinks of alcohol per week. She reports previous drug use. Drug: Marijuana.  Allergies:  Allergies  Allergen Reactions  . Sulfa Antibiotics Rash    Family history:  Family History  Problem Relation Age of Onset  . COPD Mother   . Kidney disease Mother   . Heart disease Mother   . Cancer Father      Home Meds: Prior to Admission medications   Medication Sig Start Date End Date Taking? Authorizing Provider  acetaminophen (TYLENOL) 500 MG tablet Take 500-1,000 mg by mouth every 6 (six) hours as needed for mild pain or headache.    [provider]  B Complex-C-Folic Acid (RENAL-VITE) 0.8 MG TABS Take 1 tablet by mouth daily.  07/31/18   [provider]  carvedilol (COREG) 6.25 MG tablet Take 6.25 mg by mouth See admin instructions. 1 tab Tues Thurs and Sat; 1 twice daily all other days    [provider]  cyclophosphamide (CYTOXAN) 25 MG capsule Take 25 mg by mouth daily.  07/22/18   [provider]  dapsone 100 MG tablet Take 100 mg by mouth daily. 08/28/18   [provider]  latanoprost (XALATAN) 0.005 % ophthalmic solution Place 1 drop into both eyes at bedtime.    [provider]  levothyroxine (SYNTHROID, LEVOTHROID) 125 MCG tablet Take 125 mcg by mouth daily before breakfast.  10/22/14   [provider]  [place] See admin instructions. Pt keeps sugar free candy or gum with her to keep her mouth from getting dry    [provider]  pantoprazole (PROTONIX) 40 MG tablet Take by mouth. 07/06/18   [provider]  pregabalin (LYRICA) 50 MG capsule Take 1 capsule (50 mg total) by  mouth at bedtime. 02/21/19   Black, Lezlie Octave, NP  sucroferric oxyhydroxide (VELPHORO) 500 MG chewable tablet Chew 1,000 mg by mouth 3 (three) times daily with meals.    [provider]  traZODone (DESYREL) 50 MG tablet Take 50 mg by mouth at bedtime.  08/22/18   [provider]    Physical Exam: Vitals:   04/27/19 1332 04/27/19 1549  BP: (!) 173/66 (!) 194/95  Pulse: 60 74  Resp: 18 (!) 22  Temp: 97.7 F (36.5 C) 98 F (36.7 C)  TempSrc: Oral Oral  SpO2: 95% 96%  Weight: 59 kg   Height: 5\' 4"  (1.626 m)    Wt Readings from Last 3 Encounters:  04/27/19 59 kg  04/27/19 59 kg  03/26/19 60.9 kg   Body mass index is 22.31 kg/m.  General exam: Opens eyes on verbal command, seems to be moaning but not in pain.  Has periods of dysarthric voice which alternates with fluent voice frequently.  Visibly anxious Skin: No rashes, lesions or  ulcers. HEENT: Atraumatic, normocephalic, supple neck, no obvious bleeding Lungs: Clear to auscultation bilaterally CVS: Regular rate and rhythm, no murmur GI/Abd soft, nontender, nondistended, bowel sound present CNS: Alert, awake, knows that she is in the hospital, able to give history.  Visibly anxious, mild tremors only stretching out Psychiatry: Anxious Extremities: No pedal edema, no calf tenderness     Consult Orders  (From admission, onward)         Start     Ordered   04/27/19 1642  Consult to hospitalist  ALL PATIENTS BEING ADMITTED/HAVING PROCEDURES NEED COVID-19 SCREENING  Once    Comments: ALL PATIENTS BEING ADMITTED/HAVING PROCEDURES NEED COVID-19 SCREENING  Provider:  (Not yet assigned)  Question Answer Comment  Place call to: [facility]   Reason for Consult Admit      04/27/19 1641          Labs on Admission:   CBC: Recent Labs  Lab 04/27/19 1347  WBC 5.4  HGB 7.3*  HCT 23.0*  MCV 100.9*  PLT 136*    Basic Metabolic Panel: Recent Labs  Lab 04/27/19 0853 04/27/19 1347  NA 139 139  K 5.5* 5.6*  CL 97* 97*  CO2 19* 20*  GLUCOSE 97 134*  BUN 162* 162*  CREATININE 21.44* 21.78*  CALCIUM 8.7* 8.6*    Liver Function Tests: No results for input(s): AST, ALT, ALKPHOS, BILITOT, PROT, ALBUMIN in the last 168 hours. No results for input(s): LIPASE, AMYLASE in the last 168 hours. No results for input(s): AMMONIA in the last 168 hours.  Cardiac Enzymes: No results for input(s): CKTOTAL, CKMB, CKMBINDEX, TROPONINI in the last 168 hours.  BNP (last 3 results) Recent Labs    03/03/2019 0100  BNP 1,154.7*    ProBNP (last 3 results) No results for input(s): PROBNP in the last 8760 hours.  CBG: Recent Labs  Lab 04/27/19 1704  GLUCAP 84    Lipase  No results found for: LIPASE   Urinalysis No results found for: COLORURINE, APPEARANCEUR, LABSPEC, PHURINE, GLUCOSEU, HGBUR, BILIRUBINUR, KETONESUR, PROTEINUR, UROBILINOGEN, NITRITE,  LEUKOCYTESUR   Drugs of Abuse  No results found for: LABOPIA, COCAINSCRNUR, LABBENZ, AMPHETMU, THCU, LABBARB    Radiological Exams on Admission: No results found.   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 -BUN/creatinine elevated 262/22 -Nephrology called from ED. Patient is planned to be started  on dialysis tonight.   -Monitor mental status change. -Avoid other mood altering medications: BuSpar, Lyrica, trazodone.  Goodpasture's syndrome -Continue Cytoxan.  May need renal dose adjustment, defer to pharmacy  Hypertensive urgency -Patient's blood pressure seems to be elevated 482 systolic this afternoon.  It may also be contributing to hypertensive encephalopathy. -Continue Coreg.  Hydralazine IV as needed.  Anxiety disorder -Avoid other mood altering medications: BuSpar, Lyrica, trazodone.  chronic anemia, chronic low back pain.  Hypothyroidism -Continue synthroid  Mobility: Encourage ambulation Code Status:  Full code  DVT prophylaxis:  Heparin subcu Antimicrobials:  None Fluid: None Diet: Renal diet  Consultants: Nephrology Family Communication:  Spoke with patient's brother Mr. Dnyla Antonetti today. Status is: Observation  The patient remains OBS appropriate and will d/c before 2 midnights.  If patient needs more than 1 session of dialysis and needs to stay more than 2 midnights, consider switching to inpatient status tomorrow.  Dispo: The patient is from: Home              Anticipated d/c is to: Home              Anticipated d/c date is: 1 day              Patient currently is not medically stable to  d/c.   ----------------------------------------------------------------------------------------------------------------------------------------------------------- Severity of Illness: The appropriate patient status for this patient is OBSERVATION. Observation status is judged to be reasonable and necessary in order to provide the required intensity of service to ensure the patient's safety. The patient's presenting symptoms, physical exam findings, and initial radiographic and laboratory data in the context of their medical condition is felt to place them at decreased risk for further clinical deterioration. Furthermore, it is anticipated that the patient will be medically stable for discharge from the hospital within 2 midnights of admission. The following factors support the patient status of observation.   " The patient's presenting symptoms include shaking, anxiety. " The physical exam findings include uremic encephalopathy. " The initial radiographic and laboratory data are uremia.  -----------------------------------------------------------------------------------------------------  Signed, Terrilee Croak, MD [facility] Pager: [phone] (Secure Chat preferred). 04/27/2019

## 2019-04-27 NOTE — ED Provider Notes (Signed)
[facility] EMERGENCY DEPARTMENT Provider Note   CSN: [id number] Arrival date & time: 04/27/19  3704     History Chief Complaint  Patient presents with  . Headache  . Shaking    Elan Mcelvain is a 69 y.o. female.  69yo F w/ extensive PMH below including Goodpasture's, ESRD on PD, HTN who p/w shaking. Patient states that last night she began shaking as if she were cold and the shaking has been persistent since it began. She did not sleep well at all and the shaking became worse so she called EMS. She denies any recent problems with headaches but states that because she has been shaking so much she has started to get a frontal headache. She notes that she has had problems with feeling very anxious related to the new peritoneal dialysis and risks of infection. Her PCP started her on BuSpar this week. She only takes it at night because it makes her loopy during the day. She also notes that her Coreg was recently increased to 12.5 mg twice daily. She denies any cough/cold symptoms, shortness of breath, chest pain, vomiting, fevers, or sick contacts. She took Synthroid this morning but no other medications yet.  The history is provided by the patient.  Headache      Past Medical History:  Diagnosis Date  . Abdominal pain 06/06/2018  . Acute renal failure (ARF) ([place]) 06/22/2018  . Anemia in chronic kidney disease 08/25/2018  . Anemia, unspecified 07/05/2018  . 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 Anxiety 07/05/2018  . Arthritis   . Bell's palsy 2017   "mild case"  . Cerebral aneurysm 08/2014   pt. states she has 2 aneurysms  . Chronic low back pain 04/12/2017  . Chronic pain syndrome 04/12/2017  . Closed fracture of left distal radius   . De Quervain's tenosynovitis 02/10/2018  . Degeneration of lumbar intervertebral disc 03/01/2017  . Disease of thyroid gland 04/17/2018  . End stage renal disease ([place]) 08/25/2018  . End stage renal  failure on dialysis [facility])    M/W/F on [employer]  . GERD (gastroesophageal reflux disease)   . Goodpasture syndrome ([place])   . Headache 10/30/2014  . Hyperlipidemia 04/17/2018  . Hypertension   . Hypothyroidism   . Hypoxemia 02/21/2019  . Iron deficiency anemia, unspecified 07/12/2018  . 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 PONV (postoperative nausea and vomiting)    violent vomiting  . Pulmonary edema 02/18/2019  . Referred otalgia of left ear 08/16/2018  . Sacral back pain 05/25/2016  . Scoliosis of lumbar spine 03/01/2017  . Secondary hyperparathyroidism of renal origin ([place]) 08/22/2018  . Shortness of breath 07/06/2018  . 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 Thyroid disease   . Trigger finger of left thumb 02/10/2018    Patient Active Problem List   Diagnosis Date Noted  . Hypoxemia 02/21/2019  . Pulmonary edema 02/28/2019  . Encounter for immunization 09/26/2018  . Anemia in chronic kidney disease 08/25/2018  . End stage renal disease ([place]) 08/25/2018  . Secondary hyperparathyroidism of renal origin ([place]) 08/22/2018  . Referred otalgia of left ear 08/16/2018  . Iron deficiency anemia, unspecified 07/12/2018  . Shortness of breath 07/06/2018  . Anemia, unspecified 07/05/2018  . Anxiety 07/05/2018  . 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 Acute renal failure (ARF) ([place]) 06/22/2018  . 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 Abdominal pain 06/06/2018  . Hydronephrosis due to obstruction  of ureteral orifice 06/06/2018  . Disease of thyroid gland 04/17/2018  . Hyperlipidemia 04/17/2018  . Hypertension 04/17/2018  . De Quervain's tenosynovitis 02/10/2018  . Trigger finger of left thumb 02/10/2018  . Chronic low back pain 04/12/2017  . Chronic pain syndrome 04/12/2017  . Degeneration of lumbar intervertebral disc 03/01/2017  . Scoliosis of lumbar spine 03/01/2017  . Sacral back pain 05/25/2016  . Eye pain  10/30/2014    Past Surgical History:  Procedure Laterality Date  . ARTERY BIOPSY Right 10/31/2014   Procedure: BIOPSY TEMPORAL ARTERY-RIGHT;  Surgeon: Mal Misty, MD;  Location: [place];  Service: Vascular;  Laterality: Right;  . AV FISTULA PLACEMENT Left 10/11/2018   Procedure: left arm ARTERIOVENOUS (AV) FISTULA  creation;  Surgeon: Waynetta Sandy, MD;  Location: [place];  Service: Vascular;  Laterality: Left;  . BILATERAL CARPAL TUNNEL RELEASE    . BREAST SURGERY Left    lumpectomy  . BUNIONECTOMY Right   . CHOLECYSTECTOMY N/A 10/13/2016   Procedure: LAPAROSCOPIC CHOLECYSTECTOMY WITH INTRAOPERATIVE CHOLANGIOGRAM;  Surgeon: Donnie Mesa, MD;  Location: [place];  Service: General;  Laterality: N/A;  . EYE SURGERY     surgery for cross eye  . FOOT FRACTURE SURGERY    . OPEN REDUCTION INTERNAL FIXATION (ORIF) DISTAL RADIAL FRACTURE Left 03/29/2017   Procedure: OPEN REDUCTION INTERNAL FIXATION (ORIF)LEFT  DISTAL RADIAL FRACTURE;  Surgeon: Leanora Cover, MD;  Location: [place];  Service: Orthopedics;  Laterality: Left;  . THYROIDECTOMY    . TONSILLECTOMY    . WISDOM TOOTH EXTRACTION       OB History   No obstetric history on file.     Family History  Problem Relation Age of Onset  . COPD Mother   . Kidney disease Mother   . Heart disease Mother   . Cancer Father     Social History   Tobacco Use  . Smoking status: Former Smoker    Years: 0.50    Types: Cigarettes    Quit date: 08/07/1989    Years since quitting: 29.7  . Smokeless tobacco: Never Used  Substance Use Topics  . Alcohol use: Yes    Alcohol/week: 7.0 standard drinks    Types: 7 Glasses of wine per week    Comment: a glass a night, some nights more  . Drug use: Not Currently    Types: Marijuana    Comment: last used 1993    Home Medications Prior to Admission medications   Medication Sig Start Date End Date Taking? Authorizing Provider  acetaminophen (TYLENOL) 500 MG tablet  Take 500-1,000 mg by mouth every 6 (six) hours as needed for mild pain or headache.    [provider]  B Complex-C-Folic Acid (RENAL-VITE) 0.8 MG TABS Take 1 tablet by mouth daily.  07/31/18   [provider]  carvedilol (COREG) 6.25 MG tablet Take 6.25 mg by mouth See admin instructions. 1 tab Tues Thurs and Sat; 1 twice daily all other days    [provider]  cyclophosphamide (CYTOXAN) 25 MG capsule Take 25 mg by mouth daily.  07/22/18   [provider]  dapsone 100 MG tablet Take 100 mg by mouth daily. 08/28/18   [provider]  latanoprost (XALATAN) 0.005 % ophthalmic solution Place 1 drop into both eyes at bedtime.    [provider]  levothyroxine (SYNTHROID, LEVOTHROID) 125 MCG tablet Take 125 mcg by mouth daily before breakfast.  10/22/14   [provider]  OVER  THE COUNTER MEDICATION See admin instructions. Pt keeps sugar free candy or gum with her to keep her mouth from getting dry    [provider]  pantoprazole (PROTONIX) 40 MG tablet Take by mouth. 07/06/18   [provider]  pregabalin (LYRICA) 50 MG capsule Take 1 capsule (50 mg total) by mouth at bedtime. 02/21/19   Black, Lezlie Octave, NP  sucroferric oxyhydroxide (VELPHORO) 500 MG chewable tablet Chew 1,000 mg by mouth 3 (three) times daily with meals.    [provider]  traZODone (DESYREL) 50 MG tablet Take 50 mg by mouth at bedtime.  08/22/18   [provider]    Allergies    Sulfa antibiotics  Review of Systems   Review of Systems  Neurological: Positive for headaches.   All other systems reviewed and are negative except that which was mentioned in HPI  Physical Exam Updated Vital Signs BP (!) 155/103 (BP Location: Right Arm)   Pulse 66   Temp 98.7 F (37.1 C) (Oral)   Resp 15   Ht 5\' 4"  (1.626 m)   Wt 59 kg   SpO2 93%   BMI 22.31 kg/m   Physical Exam Vitals and nursing note reviewed.  Constitutional:       Appearance: She is well-developed.     Comments: Anxious, tremors  HENT:     Head: Normocephalic and atraumatic.     Mouth/Throat:     Comments: Dry mouth Eyes:     Conjunctiva/sclera: Conjunctivae normal.  Cardiovascular:     Rate and Rhythm: Normal rate and regular rhythm.     Heart sounds: Normal heart sounds. No murmur.  Pulmonary:     Effort: Pulmonary effort is normal.     Breath sounds: Normal breath sounds.  Abdominal:     General: Bowel sounds are normal. There is no distension.     Palpations: Abdomen is soft.     Tenderness: There is no abdominal tenderness.     Comments: PD catheter in place  Musculoskeletal:     Cervical back: Neck supple.  Skin:    General: Skin is warm and dry.  Neurological:     Mental Status: She is alert and oriented to person, place, and time.     Comments: Fluent speech  Psychiatric:        Mood and Affect: Mood is anxious. Affect is tearful.        Speech: Speech is rapid and pressured.        Judgment: Judgment normal.     ED Results / Procedures / Treatments   Labs (all labs ordered are listed, but only abnormal results are displayed) Labs Reviewed  BASIC METABOLIC PANEL - Abnormal; Notable for the following components:      Result Value   Potassium 5.5 (*)    Chloride 97 (*)    CO2 19 (*)    BUN 162 (*)    Creatinine, Ser 21.44 (*)    Calcium 8.7 (*)    GFR calc non Af Amer 1 (*)    GFR calc Af Amer 2 (*)    Anion gap 23 (*)    All other components within normal limits    EKG EKG Interpretation  Date/Time:  Friday April 27 2019 08:20:28 EDT Ventricular Rate:  88 PR Interval:    QRS Duration: 121 QT Interval:  384 QTC Calculation: 465 R Axis:   10 Text Interpretation: Sinus rhythm Probable left atrial enlargement Left bundle branch block some artifact  but overall similar to previous Confirmed by Theotis Burrow [phone]) on 04/27/2019 8:33:19 AM   Radiology No results found.  Procedures Procedures (including  critical care time)  Medications Ordered in ED Medications  LORazepam (ATIVAN) injection 0.5 mg (0.5 mg Intravenous Given 04/27/19 0855)  carvedilol (COREG) tablet 12.5 mg (12.5 mg Oral Given 04/27/19 0856)  acetaminophen (TYLENOL) tablet 1,000 mg (1,000 mg Oral Given 04/27/19 0856)  LORazepam (ATIVAN) injection 0.5 mg (0.5 mg Intravenous Given 04/27/19 1111)    ED Course  I have reviewed the triage vital signs and the nursing notes.  Pertinent labs that were available during my care of the patient were reviewed by me and considered in my medical decision making (see chart for details).    MDM Rules/Calculators/A&P                      Pt was very anxious and tremulous on exam, hypertensive, afebrile. Denies infectious symptoms to suggest rigors from febrile illness. Admits to a lot of anxiety in relation to new PD. Gave ativan.  Labs show K 5.5. EKG without hyperkalemic changes. She has PD session today at 1pm therefore I feel she is ok to have dialysis later and deferred hyperk treatment for now.  She continues to be tearful on exam when I discussed her work-up and I suspect a significant component of anxiety related to her new peritoneal dialysis and her concern for risk of infection.  I have encouraged her to continue to communicate with her PCP regarding her symptoms and regarding her progress with new medication. Final Clinical Impression(s) / ED Diagnoses Final diagnoses:  Anxiety about health  Hyperkalemia    Rx / DC Orders ED Discharge Orders    None       Lucresia Simic, Wenda Overland, MD 04/27/19 1142

## 2019-04-27 NOTE — ED Provider Notes (Addendum)
[facility] EMERGENCY DEPARTMENT Provider Note   CSN: [id number] Arrival date & time: 04/27/19  1323     History Chief Complaint  Patient presents with  . Weakness    Cristina Wilson is a 69 y.o. female.  69 yo F here for dialysis.  She states that she needs dialysis and she went to the dialysis center today and they sent her here to get dialysis.  She has been feeling like both of her hands are very cold.  She has been doing peritoneal dialysis.  Level 5 caveat altered mental status.  The history is provided by the patient.  Weakness Severity:  Moderate Onset quality:  Gradual Duration:  2 days Timing:  Constant Progression:  Worsening Chronicity:  New Relieved by:  Nothing Worsened by:  Nothing Ineffective treatments:  None tried Associated symptoms: no arthralgias, no chest pain, no dizziness, no dysuria, no fever, no headaches, no myalgias, no nausea, no shortness of breath, no urgency and no vomiting        Past Medical History:  Diagnosis Date  . Abdominal pain 06/06/2018  . Acute renal failure (ARF) ([place]) 06/22/2018  . Anemia in chronic kidney disease 08/25/2018  . Anemia, unspecified 07/05/2018  . 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 Anxiety 07/05/2018  . Arthritis   . Bell's palsy 2017   "mild case"  . Cerebral aneurysm 08/2014   pt. states she has 2 aneurysms  . Chronic low back pain 04/12/2017  . Chronic pain syndrome 04/12/2017  . Closed fracture of left distal radius   . De Quervain's tenosynovitis 02/10/2018  . Degeneration of lumbar intervertebral disc 03/01/2017  . Disease of thyroid gland 04/17/2018  . End stage renal disease ([place]) 08/25/2018  . End stage renal failure on dialysis [facility])    M/W/F on [employer]  . GERD (gastroesophageal reflux disease)   . 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 Headache 10/30/2014  . Hyperlipidemia 04/17/2018  . Hypertension   . Hypothyroidism   . Hypoxemia 02/21/2019  .  Iron deficiency anemia, unspecified 07/12/2018  . 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 PONV (postoperative nausea and vomiting)    violent vomiting  . Pulmonary edema 02/08/2019  . Referred otalgia of left ear 08/16/2018  . Sacral back pain 05/25/2016  . Scoliosis of lumbar spine 03/01/2017  . Secondary hyperparathyroidism of renal origin ([place]) 08/22/2018  . Shortness of breath 07/06/2018  . 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 Thyroid disease   . Trigger finger of left thumb 02/10/2018    Patient Active Problem List   Diagnosis Date Noted  . 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 Hypoxemia 02/21/2019  . Pulmonary edema 02/16/2019  . Encounter for immunization 09/26/2018  . Anemia in chronic kidney disease 08/25/2018  . End stage renal disease ([place]) 08/25/2018  . Secondary hyperparathyroidism of renal origin ([place]) 08/22/2018  . Referred otalgia of left ear 08/16/2018  . Iron deficiency anemia, unspecified 07/12/2018  . Shortness of breath 07/06/2018  . Anemia, unspecified 07/05/2018  . Anxiety 07/05/2018  . 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 Acute renal failure (ARF) ([place]) 06/22/2018  . 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 Abdominal pain 06/06/2018  . Hydronephrosis due to obstruction of ureteral orifice 06/06/2018  . Disease of thyroid gland 04/17/2018  . Hyperlipidemia 04/17/2018  . Hypertension 04/17/2018  . De Quervain's tenosynovitis 02/10/2018  . Trigger finger of left thumb 02/10/2018  . Chronic low back pain  04/12/2017  . Chronic pain syndrome 04/12/2017  . Degeneration of lumbar intervertebral disc 03/01/2017  . Scoliosis of lumbar spine 03/01/2017  . Sacral back pain 05/25/2016  . Eye pain 10/30/2014    Past Surgical History:  Procedure Laterality Date  . ARTERY BIOPSY Right 10/31/2014   Procedure: BIOPSY TEMPORAL ARTERY-RIGHT;  Surgeon: Mal Misty, MD;  Location: [facility];  Service:  Vascular;  Laterality: Right;  . AV FISTULA PLACEMENT Left 10/11/2018   Procedure: left arm ARTERIOVENOUS (AV) FISTULA  creation;  Surgeon: Waynetta Sandy, MD;  Location: [facility];  Service: Vascular;  Laterality: Left;  . BILATERAL CARPAL TUNNEL RELEASE    . BREAST SURGERY Left    lumpectomy  . BUNIONECTOMY Right   . CHOLECYSTECTOMY N/A 10/13/2016   Procedure: LAPAROSCOPIC CHOLECYSTECTOMY WITH INTRAOPERATIVE CHOLANGIOGRAM;  Surgeon: Donnie Mesa, MD;  Location: [facility];  Service: General;  Laterality: N/A;  . EYE SURGERY     surgery for cross eye  . FOOT FRACTURE SURGERY    . OPEN REDUCTION INTERNAL FIXATION (ORIF) DISTAL RADIAL FRACTURE Left 03/29/2017   Procedure: OPEN REDUCTION INTERNAL FIXATION (ORIF)LEFT  DISTAL RADIAL FRACTURE;  Surgeon: Leanora Cover, MD;  Location: [facility];  Service: Orthopedics;  Laterality: Left;  . THYROIDECTOMY    . TONSILLECTOMY    . WISDOM TOOTH EXTRACTION       OB History   No obstetric history on file.     Family History  Problem Relation Age of Onset  . COPD Mother   . Kidney disease Mother   . Heart disease Mother   . Cancer Father     Social History   Tobacco Use  . Smoking status: Former Smoker    Years: 0.50    Types: Cigarettes    Quit date: 08/07/1989    Years since quitting: 29.7  . Smokeless tobacco: Never Used  Substance Use Topics  . Alcohol use: Yes    Alcohol/week: 7.0 standard drinks    Types: 7 Glasses of wine per week    Comment: a glass a night, some nights more  . Drug use: Not Currently    Types: Marijuana    Comment: last used 1993    Home Medications Prior to Admission medications   Medication Sig Start Date End Date Taking? Authorizing Provider  acetaminophen (TYLENOL) 500 MG tablet Take 500-1,000 mg by mouth every 6 (six) hours as needed for mild pain or headache.    [provider]  B Complex-C-Folic Acid (RENAL-VITE) 0.8 MG TABS Take 1 tablet by mouth daily.  07/31/18    [provider]  carvedilol (COREG) 6.25 MG tablet Take 6.25 mg by mouth See admin instructions. 1 tab Tues Thurs and Sat; 1 twice daily all other days    [provider]  cyclophosphamide (CYTOXAN) 25 MG capsule Take 25 mg by mouth daily.  07/22/18   [provider]  dapsone 100 MG tablet Take 100 mg by mouth daily. 08/28/18   [provider]  latanoprost (XALATAN) 0.005 % ophthalmic solution Place 1 drop into both eyes at bedtime.    [provider]  levothyroxine (SYNTHROID, LEVOTHROID) 125 MCG tablet Take 125 mcg by mouth daily before breakfast.  10/22/14   [provider]  [place] See admin instructions. Pt keeps sugar free candy or gum with her to keep her mouth from getting dry    [provider]  pantoprazole (PROTONIX) 40 MG tablet Take by mouth. 07/06/18  [provider]  pregabalin (LYRICA) 50 MG capsule Take 1 capsule (50 mg total) by mouth at bedtime. 02/21/19   Black, Lezlie Octave, NP  sucroferric oxyhydroxide (VELPHORO) 500 MG chewable tablet Chew 1,000 mg by mouth 3 (three) times daily with meals.    [provider]  traZODone (DESYREL) 50 MG tablet Take 50 mg by mouth at bedtime.  08/22/18   [provider]    Allergies    Sulfa antibiotics  Review of Systems   Review of Systems  Constitutional: Negative for chills and fever.  HENT: Negative for congestion and rhinorrhea.   Eyes: Negative for redness and visual disturbance.  Respiratory: Negative for shortness of breath and wheezing.   Cardiovascular: Negative for chest pain and palpitations.  Gastrointestinal: Negative for nausea and vomiting.  Genitourinary: Negative for dysuria and urgency.  Musculoskeletal: Negative for arthralgias and myalgias.  Skin: Negative for pallor and wound.  Neurological: Positive for weakness. Negative for dizziness and headaches.    Physical Exam Updated Vital Signs BP (!) 194/95 (BP  Location: Right Arm)   Pulse 74   Temp 98 F (36.7 C) (Oral)   Resp (!) 22   Ht 5\' 4"  (1.626 m)   Wt 59 kg   SpO2 96%   BMI 22.31 kg/m   Physical Exam Vitals and nursing note reviewed.  Constitutional:      General: She is not in acute distress.    Appearance: She is well-developed. She is not diaphoretic.  HENT:     Head: Normocephalic and atraumatic.     Comments: jvd to the jaw Eyes:     Pupils: Pupils are equal, round, and reactive to light.  Cardiovascular:     Rate and Rhythm: Normal rate and regular rhythm.     Heart sounds: No murmur. No friction rub. No gallop.   Pulmonary:     Effort: Pulmonary effort is normal.     Breath sounds: No wheezing or rales.  Abdominal:     General: There is no distension.     Palpations: Abdomen is soft.     Tenderness: There is no abdominal tenderness.  Genitourinary:    Comments: Soft brown stool, no gross blood Musculoskeletal:        General: No tenderness.     Cervical back: Normal range of motion and neck supple.  Skin:    General: Skin is warm and dry.  Neurological:     Mental Status: She is alert and oriented to person, place, and time.  Psychiatric:        Behavior: Behavior normal.     ED Results / Procedures / Treatments   Labs (all labs ordered are listed, but only abnormal results are displayed) Labs Reviewed  BASIC METABOLIC PANEL - Abnormal; Notable for the following components:      Result Value   Potassium 5.6 (*)    Chloride 97 (*)    CO2 20 (*)    Glucose, Bld 134 (*)    BUN 162 (*)    Creatinine, Ser 21.78 (*)    Calcium 8.6 (*)    GFR calc non Af Amer 1 (*)    GFR calc Af Amer 2 (*)    Anion gap 22 (*)    All other components within normal limits  CBC - Abnormal; Notable for the following components:   RBC 2.28 (*)    Hemoglobin 7.3 (*)    HCT 23.0 (*)    MCV 100.9 (*)  Platelets 136 (*)    nRBC 1.1 (*)    All other components within normal limits  TROPONIN I (HIGH SENSITIVITY) -  Abnormal; Notable for the following components:   Troponin I (High Sensitivity) 49 (*)    All other components within normal limits  RESPIRATORY PANEL BY RT PCR (FLU A&B, COVID)  URINALYSIS, ROUTINE W REFLEX MICROSCOPIC  CBG MONITORING, ED  POC OCCULT BLOOD, ED  TROPONIN I (HIGH SENSITIVITY)    EKG EKG Interpretation  Date/Time:  Friday April 27 2019 13:31:45 EDT Ventricular Rate:  62 PR Interval:  188 QRS Duration: 86 QT Interval:  446 QTC Calculation: 452 R Axis:   11 Text Interpretation: Normal sinus rhythm Minimal voltage criteria for LVH, may be normal variant ( Cornell product ) Borderline ECG No significant change since last tracing Confirmed by Deno Etienne [id number]) on 04/27/2019 4:29:09 PM   Radiology No results found.  Procedures Procedures (including critical care time)  Medications Ordered in ED Medications  sodium chloride flush (NS) 0.9 % injection 3 mL (has no administration in time range)  Chlorhexidine Gluconate Cloth 2 % PADS 6 each (has no administration in time range)    ED Course  I have reviewed the triage vital signs and the nursing notes.  Pertinent labs & imaging results that were available during my care of the patient were reviewed by me and considered in my medical decision making (see chart for details).    MDM Rules/Calculators/A&P                      69 yo F with chief complaints of needing dialysis.  Patient seems to be confused and has trouble quantifying this.  She tells me that she went to her dialysis center and they sent her here to get dialysis performed.  Will discuss with nephrology.  Discussed case with Dr. Joelyn Oms.  Recommended hospitalist admission as she will likely need at least 3 days of dialysis.  CRITICAL CARE Performed by: Cecilio Asper   Total critical care time: 35 minutes  Critical care time was exclusive of separately billable procedures and treating other patients.  Critical care was necessary to treat  or prevent imminent or life-threatening deterioration.  Critical care was time spent personally by me on the following activities: development of treatment plan with patient and/or surrogate as well as nursing, discussions with consultants, evaluation of patient's response to treatment, examination of patient, obtaining history from patient or surrogate, ordering and performing treatments and interventions, ordering and review of laboratory studies, ordering and review of radiographic studies, pulse oximetry and re-evaluation of patient's condition.  The patients results and plan were reviewed and discussed.   Any x-rays performed were independently reviewed by myself.   Differential diagnosis were considered with the presenting HPI.  Medications  sodium chloride flush (NS) 0.9 % injection 3 mL (has no administration in time range)  Chlorhexidine Gluconate Cloth 2 % PADS 6 each (has no administration in time range)    Vitals:   04/27/19 1332 04/27/19 1549  BP: (!) 173/66 (!) 194/95  Pulse: 60 74  Resp: 18 (!) 22  Temp: 97.7 F (36.5 C) 98 F (36.7 C)  TempSrc: Oral Oral  SpO2: 95% 96%  Weight: 59 kg   Height: 5\' 4"  (1.626 m)     Final diagnoses:  Uremia  Transient alteration of awareness    Admission/ observation were discussed with the admitting physician, patient and/or family and they are comfortable with  the plan.    Final Clinical Impression(s) / ED Diagnoses Final diagnoses:  Uremia  Transient alteration of awareness    Rx / DC Orders ED Discharge Orders    None       Deno Etienne, DO 04/27/19 [place], [place], DO 04/27/19 1744

## 2019-04-27 NOTE — ED Triage Notes (Addendum)
Pt seen here this am due to difficulty speaking, brought in by EMS, pt given ativan and dc'd home. Went to nephrologist this afternoon for her dialysis treatment. They were unable to do a full treatment on her today, they sent her back here for elevated K+ of 5.5  Pt does dialysis 5 days a week.  Pt recently started on Buspar (04/16/2019) am and pm, pt didn't like the way it made her feel during the day pt then started taking both tablets at night with her trazodone. Reports she quit taking the ativan at night with the new medication. Pt appears intoxicated in triage. Unsure if this is from the ativan giving here today or taken too much of her other medications.  Pt states she needs a full dialysis treatment today

## 2019-04-27 NOTE — Progress Notes (Signed)
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 Labs notable for azotemia, K 5.5. She was anxious given BZD, and dc from ED to [facility] to cont PD training. In ED she had worsened AMS, and was clearly unstable, sent back to ED.  HEre, now, K is 5.6, BUN 162, HCO3 20, Hb 7.3.    She is acutely anxious, hard to redirect.  RRR, CTAB. PD exit side bandaged, not examined. Has LUE AVF +B/T, looks ok.  Nonfocal, AAO x3.    Plan for gentle HD overnight: AVF 300/600, 2K, 1-2L UF. NO heparin.  16g.  Will need a second HD tomorrow.  More long term PD/HD plan TBD.

## 2019-04-27 NOTE — Significant Event (Addendum)
Rapid Response Event Note  Overview: Called to HD d/t sudden onset unresponsiveness, HR-30s. Pt had been on HD for approximately 45 minutes prior to event.   Initial Focused Assessment: Pt laying in bed, responsive only to painful stimulation, HR-30s with a palpable pulse. Skin clammy and diaphoretic. Initially unable to pick up BP on monitor. After about a minute, pt began to track and would follow commands. BP-83/62, HR-64, RR-18. Pt then began to speak. She denies pain and is alert and oriented. She says her HR and BP has dropped on multiple occasions during HD treatments.   Interventions: Zoll pads applied HD stopped CBG-157 PCU tx after HD Plan of Care (if not transferred): HD started backed without pulling fluid. Will tx to PCU after HD. Continue to monitor pt closely. Call RRT if further assistance needed.   2258-Called d/t HR-40s and decreased LOC. Pt HD stopped again and pt back to baseline after a few minutes. [place] notified by HD RN-still okay to tx to PCU.  Event Summary:  Dr. Annie Sable) notified at 2200 [place], NP notified at [place]: 2155 Arrived: 2158 Outcome:2228  [place]

## 2019-04-27 NOTE — Discharge Instructions (Addendum)
BE SURE TO HAVE PERITONEAL DIALYSIS TODAY AT 1PM AS SCHEDULED. FOLLOW UP WITH YOUR PRIMARY CARE DOCTOR ABOUT YOUR ANXIETY.

## 2019-04-27 NOTE — ED Triage Notes (Signed)
Pt arrives via EMS with complaints of headache and uncontrolled shaking. Pt anxious during triage. Pt HD and was dialyzed yesterday and is being taught how to dialyze at home.

## 2019-04-28 ENCOUNTER — Encounter (HOSPITAL_COMMUNITY): Payer: Self-pay | Admitting: Internal Medicine

## 2019-04-28 DIAGNOSIS — N19 Unspecified kidney failure: Secondary | ICD-10-CM | POA: Diagnosis not present

## 2019-04-28 DIAGNOSIS — Z20822 Contact with and (suspected) exposure to covid-19: Secondary | ICD-10-CM | POA: Diagnosis present

## 2019-04-28 DIAGNOSIS — M545 Low back pain: Secondary | ICD-10-CM | POA: Diagnosis present

## 2019-04-28 DIAGNOSIS — R69 Illness, unspecified: Secondary | ICD-10-CM | POA: Diagnosis not present

## 2019-04-28 DIAGNOSIS — D631 Anemia in chronic kidney disease: Secondary | ICD-10-CM | POA: Diagnosis not present

## 2019-04-28 DIAGNOSIS — F419 Anxiety disorder, unspecified: Secondary | ICD-10-CM | POA: Diagnosis present

## 2019-04-28 DIAGNOSIS — E890 Postprocedural hypothyroidism: Secondary | ICD-10-CM | POA: Diagnosis present

## 2019-04-28 DIAGNOSIS — E875 Hyperkalemia: Secondary | ICD-10-CM | POA: Diagnosis present

## 2019-04-28 DIAGNOSIS — K2970 Gastritis, unspecified, without bleeding: Secondary | ICD-10-CM | POA: Diagnosis not present

## 2019-04-28 DIAGNOSIS — R778 Other specified abnormalities of plasma proteins: Secondary | ICD-10-CM | POA: Diagnosis present

## 2019-04-28 DIAGNOSIS — K219 Gastro-esophageal reflux disease without esophagitis: Secondary | ICD-10-CM | POA: Diagnosis present

## 2019-04-28 DIAGNOSIS — N2581 Secondary hyperparathyroidism of renal origin: Secondary | ICD-10-CM | POA: Diagnosis present

## 2019-04-28 DIAGNOSIS — R519 Headache, unspecified: Secondary | ICD-10-CM | POA: Diagnosis present

## 2019-04-28 DIAGNOSIS — I953 Hypotension of hemodialysis: Secondary | ICD-10-CM | POA: Diagnosis present

## 2019-04-28 DIAGNOSIS — R001 Bradycardia, unspecified: Secondary | ICD-10-CM | POA: Diagnosis present

## 2019-04-28 DIAGNOSIS — K317 Polyp of stomach and duodenum: Secondary | ICD-10-CM | POA: Diagnosis not present

## 2019-04-28 DIAGNOSIS — M310 Hypersensitivity angiitis: Secondary | ICD-10-CM | POA: Diagnosis present

## 2019-04-28 DIAGNOSIS — D62 Acute posthemorrhagic anemia: Secondary | ICD-10-CM | POA: Diagnosis present

## 2019-04-28 DIAGNOSIS — D696 Thrombocytopenia, unspecified: Secondary | ICD-10-CM | POA: Diagnosis present

## 2019-04-28 DIAGNOSIS — D649 Anemia, unspecified: Secondary | ICD-10-CM | POA: Diagnosis not present

## 2019-04-28 DIAGNOSIS — I120 Hypertensive chronic kidney disease with stage 5 chronic kidney disease or end stage renal disease: Secondary | ICD-10-CM | POA: Diagnosis not present

## 2019-04-28 DIAGNOSIS — G9349 Other encephalopathy: Secondary | ICD-10-CM | POA: Diagnosis not present

## 2019-04-28 DIAGNOSIS — I160 Hypertensive urgency: Secondary | ICD-10-CM | POA: Diagnosis present

## 2019-04-28 DIAGNOSIS — N186 End stage renal disease: Secondary | ICD-10-CM | POA: Diagnosis not present

## 2019-04-28 DIAGNOSIS — K3189 Other diseases of stomach and duodenum: Secondary | ICD-10-CM | POA: Diagnosis not present

## 2019-04-28 DIAGNOSIS — Z992 Dependence on renal dialysis: Secondary | ICD-10-CM | POA: Diagnosis not present

## 2019-04-28 DIAGNOSIS — G9341 Metabolic encephalopathy: Secondary | ICD-10-CM | POA: Diagnosis present

## 2019-04-28 LAB — CBC
HCT: 19.1 % — ABNORMAL LOW (ref 36.0–46.0)
HCT: 27.2 % — ABNORMAL LOW (ref 36.0–46.0)
Hemoglobin: 6.3 g/dL — CL (ref 12.0–15.0)
Hemoglobin: 9.1 g/dL — ABNORMAL LOW (ref 12.0–15.0)
MCH: 32 pg (ref 26.0–34.0)
MCH: 31.4 pg (ref 26.0–34.0)
MCHC: 33 g/dL (ref 30.0–36.0)
MCHC: 33.5 g/dL (ref 30.0–36.0)
MCV: 97 fL (ref 80.0–100.0)
MCV: 93.8 fL (ref 80.0–100.0)
Platelets: 117 10*3/uL — ABNORMAL LOW (ref 150–400)
Platelets: 104 10*3/uL — ABNORMAL LOW (ref 150–400)
RBC: 1.97 MIL/uL — ABNORMAL LOW (ref 3.87–5.11)
RBC: 2.9 MIL/uL — ABNORMAL LOW (ref 3.87–5.11)
RDW: 14.6 % (ref 11.5–15.5)
RDW: 14.7 % (ref 11.5–15.5)
WBC: 4 10*3/uL (ref 4.0–10.5)
WBC: 3 10*3/uL — ABNORMAL LOW (ref 4.0–10.5)
nRBC: 1.2 % — ABNORMAL HIGH (ref 0.0–0.2)
nRBC: 2.3 % — ABNORMAL HIGH (ref 0.0–0.2)

## 2019-04-28 LAB — BASIC METABOLIC PANEL
Anion gap: 16 — ABNORMAL HIGH (ref 5–15)
BUN: 110 mg/dL — ABNORMAL HIGH (ref 8–23)
CO2: 23 mmol/L (ref 22–32)
Calcium: 8 mg/dL — ABNORMAL LOW (ref 8.9–10.3)
Chloride: 99 mmol/L (ref 98–111)
Creatinine, Ser: 16.24 mg/dL — ABNORMAL HIGH (ref 0.44–1.00)
GFR calc Af Amer: 2 mL/min — ABNORMAL LOW (ref >60)
GFR calc non Af Amer: 2 mL/min — ABNORMAL LOW (ref >60)
Glucose, Bld: 89 mg/dL (ref 70–99)
Potassium: 4.6 mmol/L (ref 3.5–5.1)
Sodium: 138 mmol/L (ref 135–145)

## 2019-04-28 LAB — MRSA PCR SCREENING: MRSA by PCR: NEGATIVE

## 2019-04-28 LAB — PREPARE RBC (CROSSMATCH)

## 2019-04-28 LAB — GLUCOSE, CAPILLARY: Glucose-Capillary: 157 mg/dL — ABNORMAL HIGH (ref 70–99)

## 2019-04-28 LAB — ABO/RH: ABO/RH(D): A POS

## 2019-04-28 MED ORDER — LORAZEPAM 2 MG/ML IJ SOLN
0.5000 mg | Freq: Once | INTRAMUSCULAR | Status: AC
  Administered 2019-04-28: 02:00:00 0.5 mg via INTRAVENOUS
  Filled 2019-04-28: qty 1

## 2019-04-28 MED ORDER — PEG 3350-KCL-NA BICARB-NACL 420 G PO SOLR
4000.0000 mL | Freq: Once | ORAL | Status: DC
  Filled 2019-04-28: qty 4000

## 2019-04-28 MED ORDER — SODIUM CHLORIDE 0.9% IV SOLUTION: Freq: Once | INTRAVENOUS | Status: AC

## 2019-04-28 NOTE — Progress Notes (Signed)
Pt arrived to unit. Phlebotomist at bedside drawing labs at this time. CCMD called and telemetry box in place.  Pt is alert and oriented to self, time and situation. Bed alarm in place and call bell within reach.

## 2019-04-28 NOTE — Progress Notes (Signed)
  Nurse was discussing colon prep for EGD & colonoscopy scheduled for tomorrow.  Pt and pt's brother (at bedside) stated that they are not sure she can do it tonight and that they thought it was going to be just and EGD.  Pt refused to drink colon prep, pt was educated and verbalized undertanding.  Pt and brother stated : "she is having pain in the abdomen since yesterday"  Nurse spoke with GI MD to make aware.  Pt is okay with EGD for tomorrow.

## 2019-04-28 NOTE — Consult Note (Signed)
Referring Provider: TH Primary Care Physician:  Lajean Manes, MD Primary Gastroenterologist:  Dr. Penelope Coop  Reason for Consultation: Anemia, acute drop in hemoglobin  HPI: Cristina Wilson is a 69 y.o. female with past medical history of Goodpasture's syndrome, end-stage renal disease on dialysis, history of chronic anemia presented to the hospital with weakness.  She was found to have significant drop in hemoglobin G to 6.3 today.  GI is consulted for further evaluation.  Patient seen and examined at bedside in dialysis unit.  She denies any overt bleeding.  Complaining of mild discomfort around catheter site for peritoneal dialysis.  She denies any nausea or vomiting.  Denies any diarrhea or constipation.  Occult blood was negative yesterday.  Was seen by Dr. Penelope Coop earlier this year for screening colonoscopy but she was concerned about Covid and hence colonoscopy was not scheduled   Past Medical History:  Diagnosis Date  . Abdominal pain 06/06/2018  . Acute renal failure (ARF) ([place]) 06/22/2018  . Anemia in chronic kidney disease 08/25/2018  . Anemia, unspecified 07/05/2018  . 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 Anxiety 07/05/2018  . Arthritis   . Bell's palsy 2017   "mild case"  . Cerebral aneurysm 08/2014   pt. states she has 2 aneurysms  . Chronic low back pain 04/12/2017  . Chronic pain syndrome 04/12/2017  . Closed fracture of left distal radius   . De Quervain's tenosynovitis 02/10/2018  . Degeneration of lumbar intervertebral disc 03/01/2017  . Disease of thyroid gland 04/17/2018  . End stage renal disease ([place]) 08/25/2018  . End stage renal failure on dialysis [facility])    M/W/F on [employer]  . GERD (gastroesophageal reflux disease)   . Goodpasture syndrome ([place])   . Headache 10/30/2014  . Hyperlipidemia 04/17/2018  . Hypertension   . Hypothyroidism   . Hypoxemia 02/21/2019  . Iron deficiency anemia, unspecified 07/12/2018  . Other specified  coagulation defects ([place]) 07/05/2018  . PONV (postoperative nausea and vomiting)    violent vomiting  . Pulmonary edema 02/28/2019  . Referred otalgia of left ear 08/16/2018  . Sacral back pain 05/25/2016  . Scoliosis of lumbar spine 03/01/2017  . Secondary hyperparathyroidism of renal origin ([place]) 08/22/2018  . Shortness of breath 07/06/2018  . 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 Thyroid disease   . Trigger finger of left thumb 02/10/2018    Past Surgical History:  Procedure Laterality Date  . ARTERY BIOPSY Right 10/31/2014   Procedure: BIOPSY TEMPORAL ARTERY-RIGHT;  Surgeon: Mal Misty, MD;  Location: [place];  Service: Vascular;  Laterality: Right;  . AV FISTULA PLACEMENT Left 10/11/2018   Procedure: left arm ARTERIOVENOUS (AV) FISTULA  creation;  Surgeon: Waynetta Sandy, MD;  Location: [place];  Service: Vascular;  Laterality: Left;  . BILATERAL CARPAL TUNNEL RELEASE    . BREAST SURGERY Left    lumpectomy  . BUNIONECTOMY Right   . CHOLECYSTECTOMY N/A 10/13/2016   Procedure: LAPAROSCOPIC CHOLECYSTECTOMY WITH INTRAOPERATIVE CHOLANGIOGRAM;  Surgeon: Donnie Mesa, MD;  Location: [place];  Service: General;  Laterality: N/A;  . EYE SURGERY     surgery for cross eye  . FOOT FRACTURE SURGERY    . OPEN REDUCTION INTERNAL FIXATION (ORIF) DISTAL RADIAL FRACTURE Left 03/29/2017   Procedure: OPEN REDUCTION INTERNAL FIXATION (ORIF)LEFT  DISTAL RADIAL FRACTURE;  Surgeon: Leanora Cover, MD;  Location: [place];  Service: Orthopedics;  Laterality: Left;  . THYROIDECTOMY    . TONSILLECTOMY    .  WISDOM TOOTH EXTRACTION      Prior to Admission medications   Medication Sig Start Date End Date Taking? Authorizing Provider  acetaminophen (TYLENOL) 650 MG CR tablet Take 650 mg by mouth every 4 (four) hours as needed (headache).   Yes [provider]  B Complex-C-Folic Acid (RENAL-VITE) 0.8 MG TABS Take 1 tablet by mouth daily with breakfast.  07/31/18  Yes  [provider]  busPIRone (BUSPAR) 5 MG tablet Take 5 mg by mouth at bedtime. 04/19/19  Yes [provider]  carvedilol (COREG) 12.5 MG tablet Take 12.5 mg by mouth 2 (two) times daily. 04/25/19  Yes [provider]  cyclophosphamide (CYTOXAN) 25 MG capsule Take 25 mg by mouth daily with breakfast.  07/22/18  Yes [provider]  dapsone 100 MG tablet Take 100 mg by mouth daily with breakfast.  08/28/18  Yes [provider]  gentamicin cream (GARAMYCIN) 0.1 % Apply 1 application topically See admin instructions. Apply topically to port access daily after cleansing   Yes [provider]  latanoprost (XALATAN) 0.005 % ophthalmic solution Place 1 drop into both eyes at bedtime.   Yes [provider]  levothyroxine (SYNTHROID) 137 MCG tablet Take 137 mcg by mouth daily before breakfast.   Yes [provider]  OVER THE COUNTER MEDICATION See admin instructions. Pt keeps sugar free candy or gum with her to keep her mouth from getting dry   Yes [provider]  pregabalin (LYRICA) 25 MG capsule Take 25 mg by mouth daily with breakfast. 04/19/19  Yes [provider]  sevelamer carbonate (RENVELA) 800 MG tablet Take 800-2,400 mg by mouth See admin instructions. Take 3 tablets (2400 mg) by mouth three times daily with meals, take 1-2 tablets ([id number]) with snacks   Yes [provider]  sucroferric oxyhydroxide (VELPHORO) 500 MG chewable tablet Chew 1,000 mg by mouth 3 (three) times daily with meals.   Yes [provider]  traZODone (DESYREL) 50 MG tablet Take 50 mg by mouth at bedtime.  08/22/18  Yes [provider]  pregabalin (LYRICA) 50 MG capsule Take 1 capsule (50 mg total) by mouth at bedtime. Patient not taking: Reported on 04/27/2019 02/21/19   Radene Gunning, NP    Scheduled Meds: . sodium chloride   Intravenous Once  . sodium chloride   Intravenous Once  . Chlorhexidine Gluconate Cloth   6 each Topical Q0600  . cyclophosphamide  25 mg Oral Q breakfast  . latanoprost  1 drop Both Eyes QHS  . levothyroxine  137 mcg Oral QAC breakfast  . senna  1 tablet Oral BID  . sevelamer carbonate  2,400 mg Oral TID with meals  . sodium chloride flush  3 mL Intravenous Once  . sodium chloride flush  3 mL Intravenous Q12H  . sucroferric oxyhydroxide  1,000 mg Oral TID WC   Continuous Infusions: . sodium chloride    . sodium chloride     PRN Meds:.sodium chloride, sodium chloride, acetaminophen **OR** acetaminophen, alteplase, heparin, levalbuterol, lidocaine (PF), lidocaine-prilocaine, ondansetron **OR** ondansetron (ZOFRAN) IV, pentafluoroprop-tetrafluoroeth  Allergies as of 04/27/2019 - Review Complete 04/27/2019  Allergen Reaction Noted  . Sulfa antibiotics Rash 08/08/2011    Family History  Problem Relation Age of Onset  . COPD Mother   . Kidney disease Mother   . Heart disease Mother   . Cancer Father     Social History   Socioeconomic History  . Marital status: Single    Spouse name: Not  on file  . Number of children: Not on file  . Years of education: Not on file  . Highest education level: Not on file  Occupational History  . Not on file  Tobacco Use  . Smoking status: Former Smoker    Years: 0.50    Types: Cigarettes    Quit date: 08/07/1989    Years since quitting: 29.7  . Smokeless tobacco: Never Used  Substance and Sexual Activity  . Alcohol use: Yes    Alcohol/week: 7.0 standard drinks    Types: 7 Glasses of wine per week    Comment: a glass a night, some nights more  . Drug use: Not Currently    Types: Marijuana    Comment: last used 1993  . Sexual activity: Not on file  Other Topics Concern  . Not on file  Social History Narrative  . Not on file   Social Determinants of Health   Financial Resource Strain:   . Difficulty of Paying Living Expenses:   Food Insecurity:   . Worried About Charity fundraiser in the Last Year:   . Academic librarian in the Last Year:   Transportation Needs:   . Film/video editor (Medical):   Marland Kitchen Lack of Transportation (Non-Medical):   Physical Activity:   . Days of Exercise per Week:   . Minutes of Exercise per Session:   Stress:   . Feeling of Stress :   Social Connections:   . Frequency of Communication with Friends and Family:   . Frequency of Social Gatherings with Friends and Family:   . Attends Religious Services:   . Active Member of Clubs or Organizations:   . Attends Archivist Meetings:   Marland Kitchen Marital Status:   Intimate Partner Violence:   . Fear of Current or Ex-Partner:   . Emotionally Abused:   Marland Kitchen Physically Abused:   . Sexually Abused:     Review of Systems: Review of Systems  Constitutional: Positive for malaise/fatigue. Negative for chills and fever.  HENT: Negative for hearing loss and tinnitus.   Eyes: Negative for blurred vision and double vision.  Respiratory: Negative for cough and hemoptysis.   Cardiovascular: Negative for chest pain and palpitations.  Gastrointestinal: Negative for abdominal pain, blood in stool, constipation, diarrhea, heartburn, melena, nausea and vomiting.  Genitourinary: Negative for dysuria and urgency.  Musculoskeletal: Positive for joint pain. Negative for myalgias.  Skin: Negative for rash.  Neurological: Positive for weakness. Negative for seizures.  Endo/Heme/Allergies: Does not bruise/bleed easily.  Psychiatric/Behavioral: Negative for substance abuse. The patient is nervous/anxious.     Physical Exam: Vital signs: Vitals:   04/28/19 1215 04/28/19 1230  BP: (!) 174/85 (!) 163/82  Pulse: 77 77  Resp: 16 18  Temp: 98.3 F (36.8 C) 98 F (36.7 C)  SpO2: 96%    Last BM Date: 04/27/19 Physical Exam  Constitutional: She is oriented to person, place, and time. She appears well-developed and well-nourished. No distress.  HENT:  Head: Normocephalic and atraumatic.  Mouth/Throat: No oropharyngeal exudate.  Eyes: EOM  are normal. No scleral icterus.  Cardiovascular: Normal rate and normal heart sounds.  Pulmonary/Chest: Effort normal. No respiratory distress.  Ant exam only  Abdominal: Soft. Bowel sounds are normal. She exhibits no distension. There is no abdominal tenderness. There is no rebound and no guarding.  Musculoskeletal:        General: No edema. Normal range of motion.     Cervical back: Normal range  of motion and neck supple.  Neurological: She is alert and oriented to person, place, and time.  Skin: Skin is warm. No erythema.  Psychiatric:  Anxious and upset  Vitals reviewed.   GI:  Lab Results: Recent Labs    04/27/19 1347 04/28/19 0510  WBC 5.4 4.0  HGB 7.3* 6.3*  HCT 23.0* 19.1*  PLT 136* 117*   BMET Recent Labs    04/27/19 0853 04/27/19 1347 04/28/19 0510  NA 139 139 138  K 5.5* 5.6* 4.6  CL 97* 97* 99  CO2 19* 20* 23  GLUCOSE 97 134* 89  BUN 162* 162* 110*  CREATININE 21.44* 21.78* 16.24*  CALCIUM 8.7* 8.6* 8.0*   LFT No results for input(s): PROT, ALBUMIN, AST, ALT, ALKPHOS, BILITOT, BILIDIR, IBILI in the last 72 hours. PT/INR No results for input(s): LABPROT, INR in the last 72 hours.   Studies/Results: No results found.  Impression/Plan: -Acute on chronic anemia.  No overt bleeding.  Occult blood negative yesterday. -End-stage renal disease.  On dialysis. -History of Goodpasture syndrome. -Thrombocytopenia  Recommendations ------------------------ -Repeat CBC to confirm drop in hemoglobin.  Patient is currently getting dialysis.  She is due for her colonoscopy. -We will plan for EGD and colonoscopy for tomorrow. -Clear liquid diet today -N.p.o. past midnight -Start colon prep after dialysis  Risks (bleeding, infection, bowel perforation that could require surgery, sedation-related changes in cardiopulmonary systems), benefits (identification and possible treatment of source of symptoms, exclusion of certain causes of symptoms), and alternatives  (watchful waiting, radiographic imaging studies, empiric medical treatment)  were explained to patient in detail and patient wishes to proceed.     LOS: 0 days   Otis Brace  MD, FACP 04/28/2019, 12:55 PM  Contact #  [phone]

## 2019-04-28 NOTE — Progress Notes (Signed)
Admit: 04/27/2019 LOS: 0  4F ESRD, recent HD to PD, uremia  Subjective:  Marland Kitchen During HD overnight had IDH and bradycardia, symptomatic, first during UF, again with UF off . She tells me this happened often at outpt HD this AM . Is on carvedilol, held this AM . More calm, less anxiosu this AM . Labs partially corrected, as expected   04/23 0701 - 04/24 0700 In: -  Out: 265   Filed Weights   04/27/19 1332 04/28/19 0051  Weight: 59 kg 58.6 kg    Scheduled Meds: . sodium chloride   Intravenous Once  . Chlorhexidine Gluconate Cloth  6 each Topical Q0600  . cyclophosphamide  25 mg Oral Q breakfast  . latanoprost  1 drop Both Eyes QHS  . levothyroxine  137 mcg Oral QAC breakfast  . senna  1 tablet Oral BID  . sevelamer carbonate  2,400 mg Oral TID with meals  . sodium chloride flush  3 mL Intravenous Once  . sodium chloride flush  3 mL Intravenous Q12H  . sucroferric oxyhydroxide  1,000 mg Oral TID WC   Continuous Infusions: . sodium chloride    . sodium chloride     PRN Meds:.sodium chloride, sodium chloride, acetaminophen **OR** acetaminophen, alteplase, heparin, levalbuterol, lidocaine (PF), lidocaine-prilocaine, ondansetron **OR** ondansetron (ZOFRAN) IV, pentafluoroprop-tetrafluoroeth  Current Labs: reviewed    Physical Exam:  Blood pressure (!) 154/75, pulse 83, temperature 97.9 F (36.6 C), resp. rate 11, height '5\' 4"'  (1.626 m), weight 58.6 kg, SpO2 95 %. Less anxious, NAD RRR CTAB LUE AVF +B/T No LEE S/nt/nd  A 1. ESRD, recent HD-->PD, presented with uremia, AMS 2. AMS, improving; sig component of anxiety 3. Hypotension and bradycardia during HD 4/23; also occurs at outpt unit 4. Anemia, Hb 6.3 this AM, MCV 97 5. CKD-BMD  P . HD again today, graudal treatment, no UF, 2K bath . Transfuse Hb with HD today, 2u pRBC . Stop carvedilol . Hold buspar and pregabalin . If more issues with bradycardia on HD, consider cardiology consult . Medication  Issues; o Preferred narcotic agents for pain control are hydromorphone, fentanyl, and methadone. Morphine should not be used.  o Baclofen should be avoided o Avoid oral sodium phosphate and magnesium citrate based laxatives / bowel preps    Pearson Grippe MD 04/28/2019, 10:47 AM  Recent Labs  Lab 04/27/19 0853 04/27/19 1347 04/28/19 0510  NA 139 139 138  K 5.5* 5.6* 4.6  CL 97* 97* 99  CO2 19* 20* 23  GLUCOSE 97 134* 89  BUN 162* 162* 110*  CREATININE 21.44* 21.78* 16.24*  CALCIUM 8.7* 8.6* 8.0*   Recent Labs  Lab 04/27/19 1347 04/28/19 0510  WBC 5.4 4.0  HGB 7.3* 6.3*  HCT 23.0* 19.1*  MCV 100.9* 97.0  PLT 136* 117*

## 2019-04-28 NOTE — Anesthesia Preprocedure Evaluation (Addendum)
Anesthesia Evaluation  Patient identified by MRN, date of birth, ID band Patient awake    Reviewed: Allergy & Precautions, NPO status , Patient's Chart, lab work & pertinent test results, reviewed documented beta blocker date and time   History of Anesthesia Complications (+) PONV and history of anesthetic complications  Airway Mallampati: II  TM Distance: >3 FB Neck ROM: Full    Dental  (+) Dental Advisory Given   Pulmonary former smoker,    Pulmonary exam normal        Cardiovascular hypertension, Pt. on home beta blockers and Pt. on medications Normal cardiovascular exam     Neuro/Psych  Headaches, PSYCHIATRIC DISORDERS Anxiety  Cerebral aneurysm   Neuromuscular disease (Bell's palsy)    GI/Hepatic Neg liver ROS, GERD  Controlled,  Endo/Other  Hypothyroidism   Renal/GU ESRF and DialysisRenal disease     Musculoskeletal  (+) Arthritis ,   Abdominal   Peds  Hematology  (+) anemia ,  Thrombocytopenia    Anesthesia Other Findings Chronic pain  Reproductive/Obstetrics                            Anesthesia Physical Anesthesia Plan  ASA: III  Anesthesia Plan: MAC   Post-op Pain Management:    Induction: Intravenous  PONV Risk Score and Plan: 3 and Propofol infusion and Treatment may vary due to age or medical condition  Airway Management Planned: Nasal Cannula and Natural Airway  Additional Equipment: None  Intra-op Plan:   Post-operative Plan:   Informed Consent: I have reviewed the patients History and Physical, chart, labs and discussed the procedure including the risks, benefits and alternatives for the proposed anesthesia with the patient or authorized representative who has indicated his/her understanding and acceptance.       Plan Discussed with: CRNA and Anesthesiologist  Anesthesia Plan Comments:        Anesthesia Quick Evaluation

## 2019-04-28 NOTE — Progress Notes (Signed)
Treatment ended. Goal not met due to episodes x 2 of heart rate dropping to the 40's and bp dropping with symptoms. Alerted MD Joelyn Oms advised to d/c treatment for tonight will resume in am. Patient vitals stabilized awaiting transport to 3E30.

## 2019-04-28 NOTE — Plan of Care (Signed)
Pt is alert and oriented. Was transferred from the dialysis by hospital transportation staff. Pt has had shaking episodes and the on-call provider has ordered something  that is helping the patient. Alert and oriented X4. No respiratory distress is noted at this time. Will monitor pt closely

## 2019-04-28 NOTE — Progress Notes (Signed)
Pt is alert and oriented to person, place and time. Pt's family member is at bedside. Call bell within reach and bed alarm on.

## 2019-04-28 NOTE — Progress Notes (Signed)
PROGRESS NOTE  Cristina Wilson  DOB: 1950-09-19  PCP: Lajean Manes, MD LMB:[id number]  DOA: 04/27/2019  LOS: 0 days   Chief Complaint  Patient presents with  . Weakness   Brief narrative: Cristina Wilson is a 69 y.o. female with PMH significant for Goodpasture's syndrome, ESRD on dialysis MWF, HTN, HLD, anxiety disorder, chronic anemia, chronic low back pain.  At her usual state of health, patient is physically functional and cognitively intact. Patient presented to the ED early this morning with complaint of uncontrolled shaking.   Patient states she began shaking last night as if she were cold and the shaking has been persistent since it began.  She did not sleep well at all and it worsened towards the morning, also associated with frontal headache and hence she called the EMS.  She has chronic anxiety disorder and takes medications at home.   2 weeks ago, patient transitioned from hemodialysis to peritoneal dialysis.  For last 2 weeks, she got daily peritoneal dialysis during the day 5 days a week.  The fear of infection a/w peritoneal dialysis apparently has increased patient's anxiety significantly. On 4/12, patient's PCP started her on BuSpar twice daily.  She did not like the way it made her feel loopy during the day.  So she started taking both tablets at night with her trazodone.  She stopped taking Ativan when she started buspirone.  In her first presentation in the ED, patient was hypertensive, afebrile, anxious and tremulous on exam.  She was given IV Ativan after which she came down.. Labs showed potassium level elevated to 5.5 without EKG changes. She was due for hemodialysis at 1 PM and hence was discharged to the dialysis center. Patient reports to me that at the dialysis center, she was noted to be shaking again and could not complete the treatment and hence referred back to ED. Repeat labs this afternoon shows potassium level at 5.6, troponin level elevated  to 49.  Hemoglobin dropped from 9.6 earlier to 7.3. BUN/creatinine 162/21.78. ED physician called nephrologist on-call Dr. Joelyn Oms who suggested to keep the patient in the hospital.  Patient was started on dialysis tonight.   Patient was admitted under hospitalist service for further evaluation management.  Subjective: Patient was seen and examined this morning.  Elderly Caucasian female.  Sitting up at the edge of the bed.  Patient is more calm and less anxious this morning.  Her brother is at bedside. Events from last night noted.  During dialysis last night, patient had hypotension and bradycardia and hence dialysis was held. Coreg held this morning. Noted drop in hemoglobin to 6.3 today.  Assessment/Plan: Acute metabolic encephalopathy Uremic encephalopathy ESRD on dialysis MWF -Likely responsible for patient's symptoms of shaking, anxiety at presentation -BUN/creatinine elevated to 162/22 -Ultrafiltration tried last night, partially successful only however mental status much better this morning. -Patient will go for more ultrafiltration today. -On hold are mood altering medications: BuSpar, Lyrica, trazodone.  Goodpasture's syndrome -Continue Cytoxan.    Hypertensive urgency -Blood pressure was significantly elevated to 190s at presentation.   -On Coreg at home which was resumed.   -During dialysis, patient had hypotension last night.   -Coreg on hold this morning.  IV hydralazine as needed continue.   -If recurrent episodes of bradycardia of Coreg, patient may need cardiology consultation.  Acute on chronic anemia -Patient reports chronic anemia, baseline hemoglobin 9-10. -Hemoglobin at presentation was 7.3 yesterday, dropped to 6.3 today. -1 unit of PRBC transfusion planned with dialysis  today. -FOBT ordered.  Consultation with [facility] requested.  Anxiety disorder -Avoid other mood altering medications: BuSpar, Lyrica, trazodone.  Hypothyroidism -Continue  synthroid  Mobility: Encourage ambulation Code Status: Full code  DVT prophylaxis: Heparin subcu Antimicrobials: None Fluid: None Diet: Renal diet  Consultants: Nephrology Family Communication: Spoke with patient's brother Mr. Najwa Spillane at bedside today.  Status is: Observation  The patient will require care spanning > 2 midnights and likely needs to be moved to inpatient because: Persistent severe electrolyte disturbances, Altered mental status and Inpatient level of care appropriate due to severity of illness.  Will discuss with utilization review team.  Dispo: The patient is from: Home              Anticipated d/c is to: Home              Anticipated d/c date is: 2 days              Patient currently is not medically stable to d/c.  Antimicrobials: Anti-infectives (From admission, onward)   None        Code Status: Full Code   Diet Order            Diet renal with fluid restriction Fluid restriction: 1200 mL Fluid; Room service appropriate? Yes; Fluid consistency: Thin  Diet effective now              Infusions:  . sodium chloride    . sodium chloride      Scheduled Meds: . sodium chloride   Intravenous Once  . sodium chloride   Intravenous Once  . Chlorhexidine Gluconate Cloth  6 each Topical Q0600  . cyclophosphamide  25 mg Oral Q breakfast  . latanoprost  1 drop Both Eyes QHS  . levothyroxine  137 mcg Oral QAC breakfast  . senna  1 tablet Oral BID  . sevelamer carbonate  2,400 mg Oral TID with meals  . sodium chloride flush  3 mL Intravenous Once  . sodium chloride flush  3 mL Intravenous Q12H  . sucroferric oxyhydroxide  1,000 mg Oral TID WC    PRN meds: sodium chloride, sodium chloride, acetaminophen **OR** acetaminophen, alteplase, heparin, levalbuterol, lidocaine (PF), lidocaine-prilocaine, ondansetron **OR** ondansetron (ZOFRAN) IV, pentafluoroprop-tetrafluoroeth   Objective: Vitals:   04/28/19 0400 04/28/19 0403  BP:    Pulse:  83   Resp: 11   Temp:    SpO2: 91% 95%    Intake/Output Summary (Last 24 hours) at 04/28/2019 1116 Last data filed at 04/28/2019 1102 Gross per 24 hour  Intake 240 ml  Output 565 ml  Net -325 ml   Filed Weights   04/27/19 1332 04/28/19 0051  Weight: 59 kg 58.6 kg   Weight change:  Body mass index is 22.18 kg/m.   Physical Exam: General exam: Appears calm and comfortable.  Much improved than yesterday Skin: No rashes, lesions or ulcers. HEENT: Atraumatic, normocephalic, supple neck, no obvious bleeding Lungs: Clear to auscultation bilaterally CVS: Regular rate and rhythm, no murmur GI/Abd soft, nontender, nondistended, bowel sound present CNS: Alert, awake, oriented to time place and person, no shaking or anxiety today Psychiatry: Mood appropriate Extremities: No pedal edema, no calf tenderness  Data Review: I have personally reviewed the laboratory data and studies available.  Recent Labs  Lab 04/27/19 1347 04/28/19 0510  WBC 5.4 4.0  HGB 7.3* 6.3*  HCT 23.0* 19.1*  MCV 100.9* 97.0  PLT 136* 117*   Recent Labs  Lab 04/27/19 0853 04/27/19 1347  04/28/19 0510  NA 139 139 138  K 5.5* 5.6* 4.6  CL 97* 97* 99  CO2 19* 20* 23  GLUCOSE 97 134* 89  BUN 162* 162* 110*  CREATININE 21.44* 21.78* 16.24*  CALCIUM 8.7* 8.6* 8.0*    Signed, Terrilee Croak, MD [facility] Pager: [phone] (Secure Chat preferred). 04/28/2019

## 2019-04-28 NOTE — H&P (View-Only) (Signed)
Referring Provider: TH Primary Care Physician:  Lajean Manes, MD Primary Gastroenterologist:  Dr. Penelope Coop  Reason for Consultation: Anemia, acute drop in hemoglobin  HPI: Cristina Wilson is a 69 y.o. female with past medical history of Goodpasture's syndrome, end-stage renal disease on dialysis, history of chronic anemia presented to the hospital with weakness.  She was found to have significant drop in hemoglobin G to 6.3 today.  GI is consulted for further evaluation.  Patient seen and examined at bedside in dialysis unit.  She denies any overt bleeding.  Complaining of mild discomfort around catheter site for peritoneal dialysis.  She denies any nausea or vomiting.  Denies any diarrhea or constipation.  Occult blood was negative yesterday.  Was seen by Dr. Penelope Coop earlier this year for screening colonoscopy but she was concerned about Covid and hence colonoscopy was not scheduled   Past Medical History:  Diagnosis Date  . Abdominal pain 06/06/2018  . Acute renal failure (ARF) ([place]) 06/22/2018  . Anemia in chronic kidney disease 08/25/2018  . Anemia, unspecified 07/05/2018  . 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 Anxiety 07/05/2018  . Arthritis   . Bell's palsy 2017   "mild case"  . Cerebral aneurysm 08/2014   pt. states she has 2 aneurysms  . Chronic low back pain 04/12/2017  . Chronic pain syndrome 04/12/2017  . Closed fracture of left distal radius   . De Quervain's tenosynovitis 02/10/2018  . Degeneration of lumbar intervertebral disc 03/01/2017  . Disease of thyroid gland 04/17/2018  . End stage renal disease ([place]) 08/25/2018  . End stage renal failure on dialysis [facility])    M/W/F on [employer]  . GERD (gastroesophageal reflux disease)   . Goodpasture syndrome ([place])   . Headache 10/30/2014  . Hyperlipidemia 04/17/2018  . Hypertension   . Hypothyroidism   . Hypoxemia 02/21/2019  . Iron deficiency anemia, unspecified 07/12/2018  . Other specified  coagulation defects ([place]) 07/05/2018  . PONV (postoperative nausea and vomiting)    violent vomiting  . Pulmonary edema 02/10/2019  . Referred otalgia of left ear 08/16/2018  . Sacral back pain 05/25/2016  . Scoliosis of lumbar spine 03/01/2017  . Secondary hyperparathyroidism of renal origin ([place]) 08/22/2018  . Shortness of breath 07/06/2018  . 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 Thyroid disease   . Trigger finger of left thumb 02/10/2018    Past Surgical History:  Procedure Laterality Date  . ARTERY BIOPSY Right 10/31/2014   Procedure: BIOPSY TEMPORAL ARTERY-RIGHT;  Surgeon: Mal Misty, MD;  Location: [place] [facility];  Service: Vascular;  Laterality: Right;  . AV FISTULA PLACEMENT Left 10/11/2018   Procedure: left arm ARTERIOVENOUS (AV) FISTULA  creation;  Surgeon: Waynetta Sandy, MD;  Location: [place];  Service: Vascular;  Laterality: Left;  . BILATERAL CARPAL TUNNEL RELEASE    . BREAST SURGERY Left    lumpectomy  . BUNIONECTOMY Right   . CHOLECYSTECTOMY N/A 10/13/2016   Procedure: LAPAROSCOPIC CHOLECYSTECTOMY WITH INTRAOPERATIVE CHOLANGIOGRAM;  Surgeon: Donnie Mesa, MD;  Location: [place];  Service: General;  Laterality: N/A;  . EYE SURGERY     surgery for cross eye  . FOOT FRACTURE SURGERY    . OPEN REDUCTION INTERNAL FIXATION (ORIF) DISTAL RADIAL FRACTURE Left 03/29/2017   Procedure: OPEN REDUCTION INTERNAL FIXATION (ORIF)LEFT  DISTAL RADIAL FRACTURE;  Surgeon: Leanora Cover, MD;  Location: [place];  Service: Orthopedics;  Laterality: Left;  . THYROIDECTOMY    . TONSILLECTOMY    .  WISDOM TOOTH EXTRACTION      Prior to Admission medications   Medication Sig Start Date End Date Taking? Authorizing Provider  acetaminophen (TYLENOL) 650 MG CR tablet Take 650 mg by mouth every 4 (four) hours as needed (headache).   Yes [provider]  B Complex-C-Folic Acid (RENAL-VITE) 0.8 MG TABS Take 1 tablet by mouth daily with breakfast.  07/31/18  Yes  [provider]  busPIRone (BUSPAR) 5 MG tablet Take 5 mg by mouth at bedtime. 04/19/19  Yes [provider]  carvedilol (COREG) 12.5 MG tablet Take 12.5 mg by mouth 2 (two) times daily. 04/25/19  Yes [provider]  cyclophosphamide (CYTOXAN) 25 MG capsule Take 25 mg by mouth daily with breakfast.  07/22/18  Yes [provider]  dapsone 100 MG tablet Take 100 mg by mouth daily with breakfast.  08/28/18  Yes [provider]  gentamicin cream (GARAMYCIN) 0.1 % Apply 1 application topically See admin instructions. Apply topically to port access daily after cleansing   Yes [provider]  latanoprost (XALATAN) 0.005 % ophthalmic solution Place 1 drop into both eyes at bedtime.   Yes [provider]  levothyroxine (SYNTHROID) 137 MCG tablet Take 137 mcg by mouth daily before breakfast.   Yes [provider]  OVER THE COUNTER MEDICATION See admin instructions. Pt keeps sugar free candy or gum with her to keep her mouth from getting dry   Yes [provider]  pregabalin (LYRICA) 25 MG capsule Take 25 mg by mouth daily with breakfast. 04/19/19  Yes [provider]  sevelamer carbonate (RENVELA) 800 MG tablet Take 800-2,400 mg by mouth See admin instructions. Take 3 tablets (2400 mg) by mouth three times daily with meals, take 1-2 tablets ([phone]) with snacks   Yes [provider]  sucroferric oxyhydroxide (VELPHORO) 500 MG chewable tablet Chew 1,000 mg by mouth 3 (three) times daily with meals.   Yes [provider]  traZODone (DESYREL) 50 MG tablet Take 50 mg by mouth at bedtime.  08/22/18  Yes [provider]  pregabalin (LYRICA) 50 MG capsule Take 1 capsule (50 mg total) by mouth at bedtime. Patient not taking: Reported on 04/27/2019 02/21/19   Radene Gunning, NP    Scheduled Meds: . sodium chloride   Intravenous Once  . sodium chloride   Intravenous Once  . Chlorhexidine Gluconate Cloth   6 each Topical Q0600  . cyclophosphamide  25 mg Oral Q breakfast  . latanoprost  1 drop Both Eyes QHS  . levothyroxine  137 mcg Oral QAC breakfast  . senna  1 tablet Oral BID  . sevelamer carbonate  2,400 mg Oral TID with meals  . sodium chloride flush  3 mL Intravenous Once  . sodium chloride flush  3 mL Intravenous Q12H  . sucroferric oxyhydroxide  1,000 mg Oral TID WC   Continuous Infusions: . sodium chloride    . sodium chloride     PRN Meds:.sodium chloride, sodium chloride, acetaminophen **OR** acetaminophen, alteplase, heparin, levalbuterol, lidocaine (PF), lidocaine-prilocaine, ondansetron **OR** ondansetron (ZOFRAN) IV, pentafluoroprop-tetrafluoroeth  Allergies as of 04/27/2019 - Review Complete 04/27/2019  Allergen Reaction Noted  . Sulfa antibiotics Rash 08/08/2011    Family History  Problem Relation Age of Onset  . COPD Mother   . Kidney disease Mother   . Heart disease Mother   . Cancer Father     Social History   Socioeconomic History  . Marital status: Single    Spouse name: Not  on file  . Number of children: Not on file  . Years of education: Not on file  . Highest education level: Not on file  Occupational History  . Not on file  Tobacco Use  . Smoking status: Former Smoker    Years: 0.50    Types: Cigarettes    Quit date: 08/07/1989    Years since quitting: 29.7  . Smokeless tobacco: Never Used  Substance and Sexual Activity  . Alcohol use: Yes    Alcohol/week: 7.0 standard drinks    Types: 7 Glasses of wine per week    Comment: a glass a night, some nights more  . Drug use: Not Currently    Types: Marijuana    Comment: last used 1993  . Sexual activity: Not on file  Other Topics Concern  . Not on file  Social History Narrative  . Not on file   Social Determinants of Health   Financial Resource Strain:   . Difficulty of Paying Living Expenses:   Food Insecurity:   . Worried About Charity fundraiser in the Last Year:   . Academic librarian in the Last Year:   Transportation Needs:   . Film/video editor (Medical):   Marland Kitchen Lack of Transportation (Non-Medical):   Physical Activity:   . Days of Exercise per Week:   . Minutes of Exercise per Session:   Stress:   . Feeling of Stress :   Social Connections:   . Frequency of Communication with Friends and Family:   . Frequency of Social Gatherings with Friends and Family:   . Attends Religious Services:   . Active Member of Clubs or Organizations:   . Attends Archivist Meetings:   Marland Kitchen Marital Status:   Intimate Partner Violence:   . Fear of Current or Ex-Partner:   . Emotionally Abused:   Marland Kitchen Physically Abused:   . Sexually Abused:     Review of Systems: Review of Systems  Constitutional: Positive for malaise/fatigue. Negative for chills and fever.  HENT: Negative for hearing loss and tinnitus.   Eyes: Negative for blurred vision and double vision.  Respiratory: Negative for cough and hemoptysis.   Cardiovascular: Negative for chest pain and palpitations.  Gastrointestinal: Negative for abdominal pain, blood in stool, constipation, diarrhea, heartburn, melena, nausea and vomiting.  Genitourinary: Negative for dysuria and urgency.  Musculoskeletal: Positive for joint pain. Negative for myalgias.  Skin: Negative for rash.  Neurological: Positive for weakness. Negative for seizures.  Endo/Heme/Allergies: Does not bruise/bleed easily.  Psychiatric/Behavioral: Negative for substance abuse. The patient is nervous/anxious.     Physical Exam: Vital signs: Vitals:   04/28/19 1215 04/28/19 1230  BP: (!) 174/85 (!) 163/82  Pulse: 77 77  Resp: 16 18  Temp: 98.3 F (36.8 C) 98 F (36.7 C)  SpO2: 96%    Last BM Date: 04/27/19 Physical Exam  Constitutional: She is oriented to person, place, and time. She appears well-developed and well-nourished. No distress.  HENT:  Head: Normocephalic and atraumatic.  Mouth/Throat: No oropharyngeal exudate.  Eyes: EOM  are normal. No scleral icterus.  Cardiovascular: Normal rate and normal heart sounds.  Pulmonary/Chest: Effort normal. No respiratory distress.  Ant exam only  Abdominal: Soft. Bowel sounds are normal. She exhibits no distension. There is no abdominal tenderness. There is no rebound and no guarding.  Musculoskeletal:        General: No edema. Normal range of motion.     Cervical back: Normal range  of motion and neck supple.  Neurological: She is alert and oriented to person, place, and time.  Skin: Skin is warm. No erythema.  Psychiatric:  Anxious and upset  Vitals reviewed.   GI:  Lab Results: Recent Labs    04/27/19 1347 04/28/19 0510  WBC 5.4 4.0  HGB 7.3* 6.3*  HCT 23.0* 19.1*  PLT 136* 117*   BMET Recent Labs    04/27/19 0853 04/27/19 1347 04/28/19 0510  NA 139 139 138  K 5.5* 5.6* 4.6  CL 97* 97* 99  CO2 19* 20* 23  GLUCOSE 97 134* 89  BUN 162* 162* 110*  CREATININE 21.44* 21.78* 16.24*  CALCIUM 8.7* 8.6* 8.0*   LFT No results for input(s): PROT, ALBUMIN, AST, ALT, ALKPHOS, BILITOT, BILIDIR, IBILI in the last 72 hours. PT/INR No results for input(s): LABPROT, INR in the last 72 hours.   Studies/Results: No results found.  Impression/Plan: -Acute on chronic anemia.  No overt bleeding.  Occult blood negative yesterday. -End-stage renal disease.  On dialysis. -History of Goodpasture syndrome. -Thrombocytopenia  Recommendations ------------------------ -Repeat CBC to confirm drop in hemoglobin.  Patient is currently getting dialysis.  She is due for her colonoscopy. -We will plan for EGD and colonoscopy for tomorrow. -Clear liquid diet today -N.p.o. past midnight -Start colon prep after dialysis  Risks (bleeding, infection, bowel perforation that could require surgery, sedation-related changes in cardiopulmonary systems), benefits (identification and possible treatment of source of symptoms, exclusion of certain causes of symptoms), and alternatives  (watchful waiting, radiographic imaging studies, empiric medical treatment)  were explained to patient in detail and patient wishes to proceed.     LOS: 0 days   Otis Brace  MD, FACP 04/28/2019, 12:55 PM  Contact #  [phone]

## 2019-04-29 ENCOUNTER — Encounter (HOSPITAL_COMMUNITY)
Admission: EM | Disposition: A | Discharge status: Home / Self Care | Payer: Self-pay | Source: Home / Self Care | Attending: Internal Medicine

## 2019-04-29 ENCOUNTER — Inpatient Hospital Stay (HOSPITAL_COMMUNITY): Payer: Medicare HMO | Admitting: Anesthesiology

## 2019-04-29 HISTORY — PX: BIOPSY: SHX5522

## 2019-04-29 HISTORY — PX: ESOPHAGOGASTRODUODENOSCOPY (EGD) WITH PROPOFOL: SHX5813

## 2019-04-29 LAB — BASIC METABOLIC PANEL
Anion gap: 15 (ref 5–15)
BUN: 58 mg/dL — ABNORMAL HIGH (ref 8–23)
CO2: 26 mmol/L (ref 22–32)
Calcium: 8.5 mg/dL — ABNORMAL LOW (ref 8.9–10.3)
Chloride: 102 mmol/L (ref 98–111)
Creatinine, Ser: 10.91 mg/dL — ABNORMAL HIGH (ref 0.44–1.00)
GFR calc Af Amer: 4 mL/min — ABNORMAL LOW (ref >60)
GFR calc non Af Amer: 3 mL/min — ABNORMAL LOW (ref >60)
Glucose, Bld: 95 mg/dL (ref 70–99)
Potassium: 4.1 mmol/L (ref 3.5–5.1)
Sodium: 143 mmol/L (ref 135–145)

## 2019-04-29 LAB — BPAM RBC
Blood Product Expiration Date: 202105122359
Blood Product Expiration Date: 202105172359
ISSUE DATE / TIME: 202104241119
ISSUE DATE / TIME: 202104241119
Unit Type and Rh: 6200
Unit Type and Rh: 6200

## 2019-04-29 LAB — TYPE AND SCREEN
ABO/RH(D): A POS
Antibody Screen: NEGATIVE
Unit division: 0
Unit division: 0

## 2019-04-29 LAB — CBC
HCT: 32.2 % — ABNORMAL LOW (ref 36.0–46.0)
Hemoglobin: 10.6 g/dL — ABNORMAL LOW (ref 12.0–15.0)
MCH: 31.2 pg (ref 26.0–34.0)
MCHC: 32.9 g/dL (ref 30.0–36.0)
MCV: 94.7 fL (ref 80.0–100.0)
Platelets: 126 10*3/uL — ABNORMAL LOW (ref 150–400)
RBC: 3.4 MIL/uL — ABNORMAL LOW (ref 3.87–5.11)
RDW: 16.1 % — ABNORMAL HIGH (ref 11.5–15.5)
WBC: 4 10*3/uL (ref 4.0–10.5)
nRBC: 2.3 % — ABNORMAL HIGH (ref 0.0–0.2)

## 2019-04-29 SURGERY — ESOPHAGOGASTRODUODENOSCOPY (EGD) WITH PROPOFOL
Anesthesia: Monitor Anesthesia Care

## 2019-04-29 MED ORDER — SODIUM CHLORIDE 0.9 % IV SOLN
INTRAVENOUS | Status: AC | PRN
  Administered 2019-04-29: 500 mL via INTRAVENOUS

## 2019-04-29 MED ORDER — PROPOFOL 10 MG/ML IV BOLUS
INTRAVENOUS | Status: DC | PRN
  Administered 2019-04-29 (×3): 20 mg via INTRAVENOUS

## 2019-04-29 MED ORDER — PROPOFOL 500 MG/50ML IV EMUL
INTRAVENOUS | Status: DC | PRN
  Administered 2019-04-29: 100 ug/kg/min via INTRAVENOUS

## 2019-04-29 MED ORDER — PANTOPRAZOLE SODIUM 20 MG PO TBEC
20.0000 mg | DELAYED_RELEASE_TABLET | Freq: Every day | ORAL | Status: DC
  Administered 2019-04-29: 20 mg via ORAL
  Filled 2019-04-29 (×3): qty 1

## 2019-04-29 MED ORDER — PANTOPRAZOLE SODIUM 20 MG PO TBEC: 20.0000 mg | DELAYED_RELEASE_TABLET | Freq: Every day | ORAL | 0 refills | Status: AC

## 2019-04-29 MED ORDER — SODIUM CHLORIDE 0.9 % IV SOLN: INTRAVENOUS | Status: DC | PRN

## 2019-04-29 MED ORDER — ONDANSETRON HCL 4 MG/2ML IJ SOLN
INTRAMUSCULAR | Status: DC | PRN
  Administered 2019-04-29: 4 mg via INTRAVENOUS

## 2019-04-29 SURGICAL SUPPLY — 25 items

## 2019-04-29 NOTE — Interval H&P Note (Signed)
History and Physical Interval Note:  04/29/2019 8:13 AM  Cristina Wilson  has presented today for surgery, with the diagnosis of Anemia.  The various methods of treatment have been discussed with the patient and family. After consideration of risks, benefits and other options for treatment, the patient has consented to   esophagoduodenoscopy as a surgical intervention.  The patient's history has been reviewed, patient examined, no change in status, stable for surgery.  I have reviewed the patient's chart and labs.  Questions were answered to the patient's satisfaction.    Patient declined colonoscopy.  Refused to drink colonoscopy prep.  She had also declined colonoscopy as an outpatient.  Did not give any specific reason.    [name]

## 2019-04-29 NOTE — Discharge Summary (Signed)
Physician Discharge Summary  [name] SFK:[id number] DOB: 1950/08/25 DOA: 04/27/2019  PCP: Lajean Manes, MD  Admit date: 04/27/2019 Discharge date: 04/29/2019  Admitted From: Home Discharge disposition: Home   Code Status: Full Code  Diet Recommendation: Renal diet   Recommendations for Outpatient Follow-Up:   1. Follow-up with nephrology as outpatient  Discharge Diagnosis:   Active Problems:   Uremic encephalopathy    History of Present Illness / Brief narrative:  Cristina Koffman Stewartis a 69 y.o.femalewith PMH significant for Goodpasture's syndrome, ESRD on dialysis MWF, HTN, HLD, anxiety disorder, chronic anemia, chronic low back pain.At her usual state of health, patient is physically functional and cognitively intact. Patient presented to the ED early this morning with complaint of uncontrolled shaking.  Patient statesshe began shaking last night as if she were cold and the shaking has been persistent since it began. She did not sleep well at all and it worsened towards the morning, also associated with frontal headache and henceshe called the EMS. She has chronic anxiety disorder and takes medications at home.  2 weeks ago, patient transitioned from hemodialysis to peritoneal dialysis. For last 2 weeks, she got daily peritoneal dialysis during the day 5 days a week.  The fear ofinfection a/w peritoneal dialysisapparently has increased patient's anxiety significantly. On 4/12, patient's PCP started her on BuSpar twicedaily. She did not like the way it made her feel loopy during the day. So she started taking both tablets at night with her trazodone. She stopped taking Ativan when she started buspirone.  In her first presentation in the ED, patient was hypertensive, afebrile, anxious and tremulous on exam. She was given IV Ativan after which she came down.. Labs showed potassium level elevated to 5.5 without EKG changes. She was due for  hemodialysis at 1 PM and hence was discharged to the dialysis center. Patient reports to me that at the dialysis center, she was noted to be shaking again and could not complete the treatment and hence referred back to ED. Repeat labs this afternoon shows potassium level at 5.6, troponin level elevated to 49.Hemoglobin dropped from 9.6 earlier to 7.3. BUN/creatinine 162/21.78. ED physician called nephrologist on-call Dr. Joelyn Oms who suggested to keep the patient in the hospital. Patient was started on dialysis tonight.  Patient was admitted under hospitalist service for further evaluation management. See below for details.  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 -Nephrology consultation was obtained.  Patient underwent ultrafiltration with subsequent improvement in BUN/creatinine to 58/10.91 today. -Feeling significant improvement in mental status as well. -Is ready to go home.  I will continue to hold mood altering medications:BuSpar, Lyrica, trazodone. -Patient continues peritoneal dialysis from tomorrow  Goodpasture's syndrome -Continue Cytoxan.   Hypertensive urgency -Blood pressure was significantly elevated to 190s at presentation.   -Patient takes Coreg at home which is currently on hold because of episodes of bradycardia during ultrafiltration Per nephrology recommendation, we will hold Coreg at discharge. -Needs blood pressure medicine adjustment as outpatient with nephrology.  Acute on chronic anemia -Patient reports chronic anemia, baseline hemoglobin 9-10. -Hemoglobin at presentation was 7.3, dropped to 6.3 on 4/24. -2 units of PRBC transfusion given.  Hemoglobin level improved to 10.6 today.   -Patient underwent EGD today.  Only mild gastritis and small polyps noted. -Per GI, patient refuses  colonoscopy. -Started on Protonix daily.  Anxiety disorder -On hold are mood altering  medications:BuSpar, Lyrica, trazodone.  Hypothyroidism -Continuesynthroid  Stable to discharge to home today.  Mobility:Encourage ambulation Code Status:Full code  Subjective:  Seen and examined this morning.  Pleasant Caucasian female.  Wants to go home.  Discharge Exam:   Vitals:   04/29/19 0733 04/29/19 0843 04/29/19 0852 04/29/19 0906  BP: (!) 202/57 (!) 153/53 (!) 182/49 (!) 132/96  Pulse: (!) 58   63  Resp: 12 16 16 16   Temp: 97.9 F (36.6 C) 98.2 F (36.8 C)    TempSrc: Oral Oral    SpO2: 96% 94% 95% 98%  Weight: 59.6 kg     Height: 5\' 4"  (1.626 m)       Body mass index is 22.55 kg/m.  General exam: Appears calm and comfortable.  Skin: No rashes, lesions or ulcers. HEENT: Atraumatic, normocephalic, supple neck, no obvious bleeding Lungs: Clear to auscultation bilaterally CVS: Regular rate and rhythm, no murmur GI/Abd soft, nontender, nondistended, bowel sound present CNS: Alert, awake, oriented x3 Psychiatry: Mood appropriate Extremities: No pedal edema, no calf tenderness  Discharge Instructions:  Wound care: none.  Discharge Instructions    Diet - low sodium heart healthy   Complete by: As directed    Increase activity slowly   Complete by: As directed      Follow-up Information    [place], Christiane Ha, MD Follow up.   Specialty: Internal Medicine Contact information: [address] [place] [address] [phone]        Jettie Booze, MD .   Specialties: Cardiology, Radiology, Interventional Cardiology Contact information: [address] [place] [address] [phone]          Allergies as of 04/29/2019      Reactions   Sulfa Antibiotics Rash      Medication List    STOP taking these medications   busPIRone 5 MG tablet Commonly known as: BUSPAR   carvedilol 12.5 MG tablet Commonly known  as: COREG   pregabalin 25 MG capsule Commonly known as: LYRICA   pregabalin 50 MG capsule Commonly known as: LYRICA   traZODone 50 MG tablet Commonly known as: DESYREL     TAKE these medications   acetaminophen 650 MG CR tablet Commonly known as: TYLENOL Take 650 mg by mouth every 4 (four) hours as needed (headache).   cyclophosphamide 25 MG capsule Commonly known as: CYTOXAN Take 25 mg by mouth daily with breakfast.   dapsone 100 MG tablet Take 100 mg by mouth daily with breakfast.   gentamicin cream 0.1 % Commonly known as: GARAMYCIN Apply 1 application topically See admin instructions. Apply topically to port access daily after cleansing   latanoprost 0.005 % ophthalmic solution Commonly known as: XALATAN Place 1 drop into both eyes at bedtime.   levothyroxine 137 MCG tablet Commonly known as: SYNTHROID Take 137 mcg by mouth daily before breakfast.   OVER THE COUNTER MEDICATION See admin instructions. Pt keeps sugar free candy or gum with her to keep her mouth from getting dry   pantoprazole 20 MG tablet Commonly known as: PROTONIX Take 1 tablet (20 mg total) by mouth daily. Start taking on: April 30, 2019   Renal-Vite 0.8 MG Tabs Take 1 tablet by mouth daily with breakfast.   sevelamer carbonate 800 MG tablet Commonly known as: RENVELA Take 800-2,400 mg by mouth See admin instructions. Take 3 tablets (2400 mg) by mouth three times daily with meals, take 1-2 tablets ([phone]) with snacks   Velphoro 500 MG chewable  tablet Generic drug: sucroferric oxyhydroxide Chew 1,000 mg by mouth 3 (three) times daily with meals.       Time coordinating discharge: 35 minutes  The results of significant diagnostics from this hospitalization (including imaging, microbiology, ancillary and laboratory) are listed below for reference.    Procedures and Diagnostic Studies:   No results found.   Labs:   Basic Metabolic Panel: Recent Labs  Lab 04/27/19 0853  04/27/19 0853 04/27/19 1347 04/27/19 1347 04/28/19 0510 04/29/19 1009  NA 139  --  139  --  138 143  K 5.5*   < > 5.6*   < > 4.6 4.1  CL 97*  --  97*  --  99 102  CO2 19*  --  20*  --  23 26  GLUCOSE 97  --  134*  --  89 95  BUN 162*  --  162*  --  110* 58*  CREATININE 21.44*  --  21.78*  --  16.24* 10.91*  CALCIUM 8.7*  --  8.6*  --  8.0* 8.5*   < > = values in this interval not displayed.   GFR Estimated Creatinine Clearance: 4.3 mL/min (A) (by C-G formula based on SCr of 10.91 mg/dL (H)). Liver Function Tests: No results for input(s): AST, ALT, ALKPHOS, BILITOT, PROT, ALBUMIN in the last 168 hours. No results for input(s): LIPASE, AMYLASE in the last 168 hours. No results for input(s): AMMONIA in the last 168 hours. Coagulation profile No results for input(s): INR, PROTIME in the last 168 hours.  CBC: Recent Labs  Lab 04/27/19 1347 04/28/19 0510 04/28/19 1434 04/29/19 1009  WBC 5.4 4.0 3.0* 4.0  HGB 7.3* 6.3* 9.1* 10.6*  HCT 23.0* 19.1* 27.2* 32.2*  MCV 100.9* 97.0 93.8 94.7  PLT 136* 117* 104* 126*   Cardiac Enzymes: No results for input(s): CKTOTAL, CKMB, CKMBINDEX, TROPONINI in the last 168 hours. BNP: Invalid input(s): POCBNP CBG: Recent Labs  Lab 04/27/19 1704 04/27/19 2203  GLUCAP 84 157*   D-Dimer No results for input(s): DDIMER in the last 72 hours. Hgb A1c No results for input(s): HGBA1C in the last 72 hours. Lipid Profile No results for input(s): CHOL, HDL, LDLCALC, TRIG, CHOLHDL, LDLDIRECT in the last 72 hours. Thyroid function studies No results for input(s): TSH, T4TOTAL, T3FREE, THYROIDAB in the last 72 hours.  Invalid input(s): FREET3 Anemia work up No results for input(s): VITAMINB12, FOLATE, FERRITIN, TIBC, IRON, RETICCTPCT in the last 72 hours. Microbiology Recent Results (from the past 240 hour(s))  Respiratory Panel by RT PCR (Flu A&B, Covid) - Nasopharyngeal Swab     Status: None   Collection Time: 04/27/19  5:24 PM   Specimen:  Nasopharyngeal Swab  Result Value Ref Range Status   SARS Coronavirus 2 by RT PCR NEGATIVE NEGATIVE Final    Comment: (NOTE) SARS-CoV-2 target nucleic acids are NOT DETECTED. The SARS-CoV-2 RNA is generally detectable in upper respiratoy specimens during the acute phase of infection. The lowest concentration of SARS-CoV-2 viral copies this assay can detect is 131 copies/mL. A negative result does not preclude SARS-Cov-2 infection and should not be used as the sole basis for treatment or other patient management decisions. A negative result may occur with  improper specimen collection/handling, submission of specimen other than nasopharyngeal swab, presence of viral mutation(s) within the areas targeted by this assay, and inadequate number of viral copies (<131 copies/mL). A negative result must be combined with clinical observations, patient history, and epidemiological information. The expected result is Negative.  Fact Sheet for Patients:  PinkCheek.be Fact Sheet for Healthcare Providers:  GravelBags.it This test is not yet ap proved or cleared by the [place] FDA and  has been authorized for detection and/or diagnosis of SARS-CoV-2 by FDA under an Emergency Use Authorization (EUA). This EUA will remain  in effect (meaning this test can be used) for the duration of the COVID-19 declaration under Section 564(b)(1) of the Act, 21 U.S.C. section 360bbb-3(b)(1), unless the authorization is terminated or revoked sooner.    Influenza A by PCR NEGATIVE NEGATIVE Final   Influenza B by PCR NEGATIVE NEGATIVE Final    Comment: (NOTE) The Xpert Xpress SARS-CoV-2/FLU/RSV assay is intended as an aid in  the diagnosis of influenza from Nasopharyngeal swab specimens and  should not be used as a sole basis for treatment. Nasal washings and  aspirates are unacceptable for Xpert Xpress SARS-CoV-2/FLU/RSV  testing. Fact Sheet for  Patients: PinkCheek.be Fact Sheet for Healthcare Providers: GravelBags.it This test is not yet approved or cleared by the [place] FDA and  has been authorized for detection and/or diagnosis of SARS-CoV-2 by  FDA under an Emergency Use Authorization (EUA). This EUA will remain  in effect (meaning this test can be used) for the duration of the  Covid-19 declaration under Section 564(b)(1) of the Act, 21  U.S.C. section 360bbb-3(b)(1), unless the authorization is  terminated or revoked. Performed at [facility], [place] [address] [place], [place] [address]   MRSA PCR Screening     Status: None   Collection Time: 04/28/19  1:30 AM   Specimen: Nasal Swab; Nasopharyngeal  Result Value Ref Range Status   MRSA by PCR NEGATIVE NEGATIVE Final    Comment:        The GeneXpert MRSA Assay (FDA approved for NASAL specimens only), is one component of a comprehensive MRSA colonization surveillance program. It is not intended to diagnose MRSA infection nor to guide or monitor treatment for MRSA infections. Performed at [facility], [place] [address] [place], [place] [address]     Please note: You were cared for by a hospitalist during your hospital stay. Once you are discharged, your primary care physician will handle any further medical issues. Please note that NO REFILLS for any discharge medications will be authorized once you are discharged, as it is imperative that you return to your primary care physician (or establish a relationship with a primary care physician if you do not have one) for your post hospital discharge needs so that they can reassess your need for medications and monitor your lab values.  Signed: Marlowe Aschoff Bufford Helms  [facility] 04/29/2019, 11:05 AM

## 2019-04-29 NOTE — Anesthesia Postprocedure Evaluation (Signed)
Anesthesia Post Note  Patient: Cristina Wilson  Procedure(s) Performed: ESOPHAGOGASTRODUODENOSCOPY (EGD) WITH PROPOFOL (N/A ) BIOPSY     Patient location during evaluation: PACU Anesthesia Type: MAC Level of consciousness: awake and alert Pain management: pain level controlled Vital Signs Assessment: post-procedure vital signs reviewed and stable Respiratory status: spontaneous breathing, nonlabored ventilation and respiratory function stable Cardiovascular status: stable and blood pressure returned to baseline Anesthetic complications: no    Last Vitals:  Vitals:   04/29/19 0852 04/29/19 0906  BP: (!) 182/49 (!) 132/96  Pulse:  63  Resp: 16 16  Temp:    SpO2: 95% 98%    Last Pain:  Vitals:   04/29/19 0852  TempSrc:   PainSc: 0-No pain                 Audry Pili

## 2019-04-29 NOTE — Progress Notes (Signed)
Patient off of unit for EGD.

## 2019-04-29 NOTE — Op Note (Signed)
[facility] Patient Name: Cristina Wilson Procedure Date : 04/29/2019 MRN: [id number] Attending MD: Otis Brace , MD Date of Birth: [birth year] CSN: [id number] Age: 69 Admit Type: Inpatient Procedure:                Upper GI endoscopy Indications:              Iron deficiency anemia Providers:                Otis Brace, MD, Angus Seller, Laverda Sorenson, Technician, Clearnce Sorrel, CRNA Referring MD:              Medicines:                Sedation Administered by an Anesthesia Professional Complications:            No immediate complications. Estimated Blood Loss:     Estimated blood loss was minimal. Procedure:                Pre-Anesthesia Assessment:                           - Prior to the procedure, a History and Physical                            was performed, and patient medications and                            allergies were reviewed. The patient's tolerance of                            previous anesthesia was also reviewed. The risks                            and benefits of the procedure and the sedation                            options and risks were discussed with the patient.                            All questions were answered, and informed consent                            was obtained. Prior Anticoagulants: The patient has                            taken no previous anticoagulant or antiplatelet                            agents. ASA Grade Assessment: III - A patient with                            severe systemic disease. After reviewing the risks  and benefits, the patient was deemed in                            satisfactory condition to undergo the procedure.                           After obtaining informed consent, the endoscope was                            passed under direct vision. Throughout the                            procedure, the patient's blood pressure, pulse,  and                            oxygen saturations were monitored continuously. The                            GIF-H190 ([id number]) Olympus gastroscope was                            introduced through the mouth, and advanced to the                            second part of duodenum. The upper GI endoscopy was                            accomplished without difficulty. The patient                            tolerated the procedure well. Scope In: Scope Out: Findings:      The Z-line was regular and was found 40 cm from the incisors.      Two small sessile polyps were found in the cardia. Biopsies were taken       with a cold forceps for histology.      Scattered mild inflammation was found in the prepyloric region of the       stomach. Biopsies were taken with a cold forceps for histology.      The duodenal bulb, first portion of the duodenum and second portion of       the duodenum were normal. Impression:               - Z-line regular, 40 cm from the incisors.                           - Two gastric polyps. Biopsied.                           - Gastritis. Biopsied.                           - Normal duodenal bulb, first portion of the                            duodenum and second portion of the  duodenum. Recommendation:           - Return patient to hospital ward for ongoing care.                           - Cardiac diet.                           - Continue present medications.                           - Await pathology results.                           - Return to GI office in 4 weeks. Procedure Code(s):        --- Professional ---                           [phone], Esophagogastroduodenoscopy, flexible,                            transoral; with biopsy, single or multiple Diagnosis Code(s):        --- Professional ---                           K31.7, Polyp of stomach and duodenum                           K29.70, Gastritis, unspecified, without bleeding                            D50.9, Iron deficiency anemia, unspecified CPT copyright 2019 American Medical Association. All rights reserved. The codes documented in this report are preliminary and upon coder review may  be revised to meet current compliance requirements. Otis Brace, MD Otis Brace, MD 04/29/2019 8:40:52 AM Number of Addenda: 0

## 2019-04-29 NOTE — Progress Notes (Signed)
Discharge education and medication education given to patient and brother with teach back. Education on increasing activity slowly, low sodium heart healthy diet and when to call MD given.  All questions and concerns answered. Peripheral IV and telemetry leads removed. All patient belongings given to patient, these include. Patient transported to main entrance by nurse- refused wheelchair

## 2019-04-29 NOTE — Brief Op Note (Signed)
04/27/2019 - 04/29/2019  8:41 AM  PATIENT:  Cristina Wilson  69 y.o. female  PRE-OPERATIVE DIAGNOSIS:  GI bleed, Anemia  POST-OPERATIVE DIAGNOSIS:  gastritis; no bleeding; gastric biopsies; gastric polyp biopsies  PROCEDURE:  Procedure(s): ESOPHAGOGASTRODUODENOSCOPY (EGD) WITH PROPOFOL (N/A) BIOPSY  SURGEON:  Surgeon(s) and Role:    * Pearlena Ow, MD - Primary  Findings ----------- -EGD showed mild gastritis and small polyps in the cardia.  Biopsies taken.  No active bleeding.  Recommendations -------------------------- -Advance diet -Change Protonix to p.o. once a day -Patient has declined colonoscopy. -Hemoglobin has improved with blood transfusion.  Denies overt bleeding. -No further inpatient GI work-up planned. -GI will sign off.  Follow-up with Dr. Penelope Coop if she decides to have outpatient colonoscopy done.  Otis Brace MD, [place] 04/29/2019, 8:42 AM  Contact #  [phone]

## 2019-04-29 NOTE — Progress Notes (Signed)
Patient returned to unit from EGD.

## 2019-04-29 NOTE — Transfer of Care (Signed)
Immediate Anesthesia Transfer of Care Note  Patient: Cristina Wilson  Procedure(s) Performed: ESOPHAGOGASTRODUODENOSCOPY (EGD) WITH PROPOFOL (N/A ) BIOPSY  Patient Location: Endoscopy Unit  Anesthesia Type:MAC  Level of Consciousness: awake, alert  and oriented  Airway & Oxygen Therapy: Patient Spontanous Breathing  Post-op Assessment: Report given to RN and Post -op Vital signs reviewed and stable  Post vital signs: Reviewed and stable  Last Vitals:  Vitals Value Taken Time  BP    Temp    Pulse    Resp    SpO2      Last Pain:  Vitals:   04/29/19 0733  TempSrc: Oral  PainSc: 0-No pain         Complications: No apparent anesthesia complications

## 2019-04-29 NOTE — Progress Notes (Signed)
Admit: 04/27/2019 LOS: 1  24F ESRD, recent HD to [facility], uremia  Subjective:  . HD yesterday, no report of IDH or bradycardia . 2u PRBC yesterday . EGD this AM, no obvious bleeding identified . She is more awake, alert, calm/herself this AM . She wants to go home . AM BMP pending  04/24 0701 - 04/25 0700 In: 3128 [P.O.:480; Blood:1360] Out: -300 [Urine:300]  Filed Weights   04/28/19 1403 04/29/19 0527 04/29/19 0733  Weight: 59.6 kg 59.6 kg 59.6 kg    Scheduled Meds: . Chlorhexidine Gluconate Cloth  6 each Topical Q0600  . cyclophosphamide  25 mg Oral Q breakfast  . latanoprost  1 drop Both Eyes QHS  . levothyroxine  137 mcg Oral QAC breakfast  . pantoprazole  20 mg Oral Daily  . polyethylene glycol-electrolytes  4,000 mL Oral Once  . senna  1 tablet Oral BID  . sevelamer carbonate  2,400 mg Oral TID with meals  . sodium chloride flush  3 mL Intravenous Once  . sodium chloride flush  3 mL Intravenous Q12H  . sucroferric oxyhydroxide  1,000 mg Oral TID WC   Continuous Infusions: . sodium chloride    . sodium chloride     PRN Meds:.sodium chloride, sodium chloride, acetaminophen **OR** acetaminophen, alteplase, heparin, levalbuterol, lidocaine (PF), lidocaine-prilocaine, ondansetron **OR** ondansetron (ZOFRAN) IV, pentafluoroprop-tetrafluoroeth  Current Labs: reviewed    Physical Exam:  Blood pressure (!) 132/96, pulse 63, temperature 98.2 F (36.8 C), temperature source Oral, resp. rate 16, height _0  (1.626 m), weight 59.6 kg, SpO2 98 %. Less anxious, NAD RRR CTAB LUE AVF +B/T No LEE S/nt/nd  A 1. ESRD, recent HD-->[facility], presented with uremia, AMS; improved 2. AMS, improving; sig component of anxiety; held buspar and pregabalin 3. Hypotension and bradycardia during HD 4/23; also occurs at outpt unit; stable after holding carvedilol and limiting UF 4. Anemia, Hb 6.3 4/24, MCV 97 s/p 2u RRBC 4/24; EGD 4/25 neg 5. CKD-BMD  P . BMP this AM, if labs sig improved  probalby ok for DC back to [facility] where they will closely monijtor her status . I would not cont buspar, pregabalin, or carvedilol at DC . Medication Issues; o Preferred narcotic agents for pain control are hydromorphone, fentanyl, and methadone. Morphine should not be used.  o Baclofen should be avoided o Avoid oral sodium phosphate and magnesium citrate based laxatives / bowel preps    Pearson Grippe MD 04/29/2019, 9:56 AM  Recent Labs  Lab 04/27/19 0853 04/27/19 1347 04/28/19 0510  NA 139 139 138  K 5.5* 5.6* 4.6  CL 97* 97* 99  CO2 19* 20* 23  GLUCOSE 97 134* 89  BUN 162* 162* 110*  CREATININE 21.44* 21.78* 16.24*  CALCIUM 8.7* 8.6* 8.0*   Recent Labs  Lab 04/27/19 1347 04/28/19 0510 04/28/19 1434  WBC 5.4 4.0 3.0*  HGB 7.3* 6.3* 9.1*  HCT 23.0* 19.1* 27.2*  MCV 100.9* 97.0 93.8  PLT 136* 117* 104*

## 2019-04-30 ENCOUNTER — Encounter: Payer: Self-pay | Admitting: *Deleted

## 2019-04-30 DIAGNOSIS — R109 Unspecified abdominal pain: Secondary | ICD-10-CM | POA: Diagnosis not present

## 2019-04-30 DIAGNOSIS — Z992 Dependence on renal dialysis: Secondary | ICD-10-CM | POA: Diagnosis not present

## 2019-04-30 DIAGNOSIS — N186 End stage renal disease: Secondary | ICD-10-CM | POA: Diagnosis not present

## 2019-04-30 DIAGNOSIS — N2581 Secondary hyperparathyroidism of renal origin: Secondary | ICD-10-CM | POA: Diagnosis not present

## 2019-04-30 DIAGNOSIS — I10 Essential (primary) hypertension: Secondary | ICD-10-CM | POA: Diagnosis not present

## 2019-04-30 DIAGNOSIS — D631 Anemia in chronic kidney disease: Secondary | ICD-10-CM | POA: Diagnosis not present

## 2019-05-01 ENCOUNTER — Other Ambulatory Visit: Payer: Self-pay | Admitting: Physician Assistant

## 2019-05-01 LAB — SURGICAL PATHOLOGY

## 2019-05-04 DIAGNOSIS — I129 Hypertensive chronic kidney disease with stage 1 through stage 4 chronic kidney disease, or unspecified chronic kidney disease: Secondary | ICD-10-CM | POA: Diagnosis not present

## 2019-05-04 DIAGNOSIS — Z992 Dependence on renal dialysis: Secondary | ICD-10-CM | POA: Diagnosis not present

## 2019-05-04 DIAGNOSIS — N186 End stage renal disease: Secondary | ICD-10-CM | POA: Diagnosis not present

## 2019-05-04 DIAGNOSIS — N019 Rapidly progressive nephritic syndrome with unspecified morphologic changes: Secondary | ICD-10-CM | POA: Diagnosis not present

## 2019-05-04 DIAGNOSIS — R69 Illness, unspecified: Secondary | ICD-10-CM | POA: Diagnosis not present

## 2019-05-05 DIAGNOSIS — N186 End stage renal disease: Secondary | ICD-10-CM | POA: Diagnosis not present

## 2019-05-05 DIAGNOSIS — E8770 Fluid overload, unspecified: Secondary | ICD-10-CM | POA: Diagnosis not present

## 2019-05-05 DIAGNOSIS — N2581 Secondary hyperparathyroidism of renal origin: Secondary | ICD-10-CM | POA: Diagnosis not present

## 2019-05-05 DIAGNOSIS — Z992 Dependence on renal dialysis: Secondary | ICD-10-CM | POA: Diagnosis not present

## 2019-05-08 DIAGNOSIS — D631 Anemia in chronic kidney disease: Secondary | ICD-10-CM | POA: Diagnosis not present

## 2019-05-08 DIAGNOSIS — D688 Other specified coagulation defects: Secondary | ICD-10-CM | POA: Diagnosis not present

## 2019-05-08 DIAGNOSIS — M310 Hypersensitivity angiitis: Secondary | ICD-10-CM | POA: Diagnosis not present

## 2019-05-08 DIAGNOSIS — N2581 Secondary hyperparathyroidism of renal origin: Secondary | ICD-10-CM | POA: Diagnosis not present

## 2019-05-08 DIAGNOSIS — N186 End stage renal disease: Secondary | ICD-10-CM | POA: Diagnosis not present

## 2019-05-08 DIAGNOSIS — Z992 Dependence on renal dialysis: Secondary | ICD-10-CM | POA: Diagnosis not present

## 2019-05-09 DIAGNOSIS — M461 Sacroiliitis, not elsewhere classified: Secondary | ICD-10-CM | POA: Diagnosis not present

## 2019-05-10 DIAGNOSIS — T85691A Other mechanical complication of intraperitoneal dialysis catheter, initial encounter: Secondary | ICD-10-CM | POA: Diagnosis not present

## 2019-05-10 DIAGNOSIS — M310 Hypersensitivity angiitis: Secondary | ICD-10-CM | POA: Diagnosis not present

## 2019-05-10 DIAGNOSIS — Z992 Dependence on renal dialysis: Secondary | ICD-10-CM | POA: Diagnosis not present

## 2019-05-10 DIAGNOSIS — I120 Hypertensive chronic kidney disease with stage 5 chronic kidney disease or end stage renal disease: Secondary | ICD-10-CM | POA: Diagnosis not present

## 2019-05-10 DIAGNOSIS — Z4902 Encounter for fitting and adjustment of peritoneal dialysis catheter: Secondary | ICD-10-CM | POA: Diagnosis not present

## 2019-05-10 DIAGNOSIS — N186 End stage renal disease: Secondary | ICD-10-CM | POA: Diagnosis not present

## 2019-05-18 ENCOUNTER — Other Ambulatory Visit: Payer: Self-pay | Admitting: Geriatric Medicine

## 2019-05-18 ENCOUNTER — Ambulatory Visit
Admission: RE | Admit: 2019-05-18 | Discharge: 2019-05-18 | Disposition: A | Discharge status: Home / Self Care | Payer: Medicare HMO | Source: Ambulatory Visit | Attending: Geriatric Medicine | Admitting: Geriatric Medicine

## 2019-05-18 DIAGNOSIS — M50321 Other cervical disc degeneration at C4-C5 level: Secondary | ICD-10-CM | POA: Diagnosis not present

## 2019-05-18 DIAGNOSIS — M542 Cervicalgia: Secondary | ICD-10-CM

## 2019-05-18 DIAGNOSIS — M47814 Spondylosis without myelopathy or radiculopathy, thoracic region: Secondary | ICD-10-CM | POA: Diagnosis not present

## 2019-05-18 DIAGNOSIS — M546 Pain in thoracic spine: Secondary | ICD-10-CM

## 2019-05-18 DIAGNOSIS — I700 Atherosclerosis of aorta: Secondary | ICD-10-CM | POA: Diagnosis not present

## 2019-05-18 DIAGNOSIS — Z992 Dependence on renal dialysis: Secondary | ICD-10-CM | POA: Diagnosis not present

## 2019-05-18 DIAGNOSIS — N186 End stage renal disease: Secondary | ICD-10-CM | POA: Diagnosis not present

## 2019-05-18 DIAGNOSIS — I10 Essential (primary) hypertension: Secondary | ICD-10-CM | POA: Diagnosis not present

## 2019-05-18 DIAGNOSIS — E7800 Pure hypercholesterolemia, unspecified: Secondary | ICD-10-CM | POA: Diagnosis not present

## 2019-05-23 DIAGNOSIS — M461 Sacroiliitis, not elsewhere classified: Secondary | ICD-10-CM | POA: Diagnosis not present

## 2019-06-04 DIAGNOSIS — Z992 Dependence on renal dialysis: Secondary | ICD-10-CM | POA: Diagnosis not present

## 2019-06-04 DIAGNOSIS — N019 Rapidly progressive nephritic syndrome with unspecified morphologic changes: Secondary | ICD-10-CM | POA: Diagnosis not present

## 2019-06-04 DIAGNOSIS — N186 End stage renal disease: Secondary | ICD-10-CM | POA: Diagnosis not present

## 2019-06-05 DIAGNOSIS — D688 Other specified coagulation defects: Secondary | ICD-10-CM | POA: Diagnosis not present

## 2019-06-05 DIAGNOSIS — N2581 Secondary hyperparathyroidism of renal origin: Secondary | ICD-10-CM | POA: Diagnosis not present

## 2019-06-05 DIAGNOSIS — N186 End stage renal disease: Secondary | ICD-10-CM | POA: Diagnosis not present

## 2019-06-05 DIAGNOSIS — Z992 Dependence on renal dialysis: Secondary | ICD-10-CM | POA: Diagnosis not present

## 2019-06-19 DIAGNOSIS — M461 Sacroiliitis, not elsewhere classified: Secondary | ICD-10-CM | POA: Diagnosis not present

## 2019-07-04 DIAGNOSIS — N019 Rapidly progressive nephritic syndrome with unspecified morphologic changes: Secondary | ICD-10-CM | POA: Diagnosis not present

## 2019-07-04 DIAGNOSIS — N186 End stage renal disease: Secondary | ICD-10-CM | POA: Diagnosis not present

## 2019-07-04 DIAGNOSIS — Z992 Dependence on renal dialysis: Secondary | ICD-10-CM | POA: Diagnosis not present

## 2019-07-05 DIAGNOSIS — D688 Other specified coagulation defects: Secondary | ICD-10-CM | POA: Diagnosis not present

## 2019-07-05 DIAGNOSIS — N186 End stage renal disease: Secondary | ICD-10-CM | POA: Diagnosis not present

## 2019-07-05 DIAGNOSIS — D631 Anemia in chronic kidney disease: Secondary | ICD-10-CM | POA: Diagnosis not present

## 2019-07-05 DIAGNOSIS — Z992 Dependence on renal dialysis: Secondary | ICD-10-CM | POA: Diagnosis not present

## 2019-07-05 DIAGNOSIS — M310 Hypersensitivity angiitis: Secondary | ICD-10-CM | POA: Diagnosis not present

## 2019-07-05 DIAGNOSIS — N2581 Secondary hyperparathyroidism of renal origin: Secondary | ICD-10-CM | POA: Diagnosis not present

## 2019-07-11 DIAGNOSIS — D3132 Benign neoplasm of left choroid: Secondary | ICD-10-CM | POA: Diagnosis not present

## 2019-07-11 DIAGNOSIS — H43811 Vitreous degeneration, right eye: Secondary | ICD-10-CM | POA: Diagnosis not present

## 2019-07-11 DIAGNOSIS — H16223 Keratoconjunctivitis sicca, not specified as Sjogren's, bilateral: Secondary | ICD-10-CM | POA: Diagnosis not present

## 2019-07-11 DIAGNOSIS — H401112 Primary open-angle glaucoma, right eye, moderate stage: Secondary | ICD-10-CM | POA: Diagnosis not present

## 2019-07-11 DIAGNOSIS — H401121 Primary open-angle glaucoma, left eye, mild stage: Secondary | ICD-10-CM | POA: Diagnosis not present

## 2019-07-11 DIAGNOSIS — H2513 Age-related nuclear cataract, bilateral: Secondary | ICD-10-CM | POA: Diagnosis not present

## 2019-07-11 DIAGNOSIS — G510 Bell's palsy: Secondary | ICD-10-CM | POA: Diagnosis not present

## 2019-07-11 DIAGNOSIS — H35372 Puckering of macula, left eye: Secondary | ICD-10-CM | POA: Diagnosis not present

## 2019-07-23 DIAGNOSIS — N186 End stage renal disease: Secondary | ICD-10-CM | POA: Diagnosis not present

## 2019-07-23 DIAGNOSIS — I129 Hypertensive chronic kidney disease with stage 1 through stage 4 chronic kidney disease, or unspecified chronic kidney disease: Secondary | ICD-10-CM | POA: Diagnosis not present

## 2019-07-26 DIAGNOSIS — H401112 Primary open-angle glaucoma, right eye, moderate stage: Secondary | ICD-10-CM | POA: Diagnosis not present

## 2019-07-26 DIAGNOSIS — H2511 Age-related nuclear cataract, right eye: Secondary | ICD-10-CM | POA: Diagnosis not present

## 2019-08-04 DIAGNOSIS — N186 End stage renal disease: Secondary | ICD-10-CM | POA: Diagnosis not present

## 2019-08-04 DIAGNOSIS — Z992 Dependence on renal dialysis: Secondary | ICD-10-CM | POA: Diagnosis not present

## 2019-08-04 DIAGNOSIS — N019 Rapidly progressive nephritic syndrome with unspecified morphologic changes: Secondary | ICD-10-CM | POA: Diagnosis not present

## 2019-08-05 DIAGNOSIS — H2512 Age-related nuclear cataract, left eye: Secondary | ICD-10-CM | POA: Diagnosis not present

## 2019-08-07 DIAGNOSIS — D631 Anemia in chronic kidney disease: Secondary | ICD-10-CM | POA: Diagnosis not present

## 2019-08-07 DIAGNOSIS — Z992 Dependence on renal dialysis: Secondary | ICD-10-CM | POA: Diagnosis not present

## 2019-08-07 DIAGNOSIS — N2581 Secondary hyperparathyroidism of renal origin: Secondary | ICD-10-CM | POA: Diagnosis not present

## 2019-08-07 DIAGNOSIS — D688 Other specified coagulation defects: Secondary | ICD-10-CM | POA: Diagnosis not present

## 2019-08-07 DIAGNOSIS — M310 Hypersensitivity angiitis: Secondary | ICD-10-CM | POA: Diagnosis not present

## 2019-08-07 DIAGNOSIS — N186 End stage renal disease: Secondary | ICD-10-CM | POA: Diagnosis not present

## 2019-08-09 DIAGNOSIS — H2512 Age-related nuclear cataract, left eye: Secondary | ICD-10-CM | POA: Diagnosis not present

## 2019-08-09 DIAGNOSIS — H401121 Primary open-angle glaucoma, left eye, mild stage: Secondary | ICD-10-CM | POA: Diagnosis not present

## 2019-08-13 DIAGNOSIS — M5412 Radiculopathy, cervical region: Secondary | ICD-10-CM | POA: Diagnosis not present

## 2019-08-13 DIAGNOSIS — M9902 Segmental and somatic dysfunction of thoracic region: Secondary | ICD-10-CM | POA: Diagnosis not present

## 2019-08-13 DIAGNOSIS — M5441 Lumbago with sciatica, right side: Secondary | ICD-10-CM | POA: Diagnosis not present

## 2019-08-13 DIAGNOSIS — M62830 Muscle spasm of back: Secondary | ICD-10-CM | POA: Diagnosis not present

## 2019-08-13 DIAGNOSIS — M9901 Segmental and somatic dysfunction of cervical region: Secondary | ICD-10-CM | POA: Diagnosis not present

## 2019-08-13 DIAGNOSIS — M9903 Segmental and somatic dysfunction of lumbar region: Secondary | ICD-10-CM | POA: Diagnosis not present

## 2019-08-14 DIAGNOSIS — M5412 Radiculopathy, cervical region: Secondary | ICD-10-CM | POA: Diagnosis not present

## 2019-08-14 DIAGNOSIS — M62830 Muscle spasm of back: Secondary | ICD-10-CM | POA: Diagnosis not present

## 2019-08-14 DIAGNOSIS — M9903 Segmental and somatic dysfunction of lumbar region: Secondary | ICD-10-CM | POA: Diagnosis not present

## 2019-08-14 DIAGNOSIS — M9901 Segmental and somatic dysfunction of cervical region: Secondary | ICD-10-CM | POA: Diagnosis not present

## 2019-08-14 DIAGNOSIS — M5441 Lumbago with sciatica, right side: Secondary | ICD-10-CM | POA: Diagnosis not present

## 2019-08-14 DIAGNOSIS — M9902 Segmental and somatic dysfunction of thoracic region: Secondary | ICD-10-CM | POA: Diagnosis not present

## 2019-08-17 DIAGNOSIS — I120 Hypertensive chronic kidney disease with stage 5 chronic kidney disease or end stage renal disease: Secondary | ICD-10-CM | POA: Diagnosis not present

## 2019-08-17 DIAGNOSIS — Z114 Encounter for screening for human immunodeficiency virus [HIV]: Secondary | ICD-10-CM | POA: Diagnosis not present

## 2019-08-17 DIAGNOSIS — K219 Gastro-esophageal reflux disease without esophagitis: Secondary | ICD-10-CM | POA: Diagnosis not present

## 2019-08-17 DIAGNOSIS — R69 Illness, unspecified: Secondary | ICD-10-CM | POA: Diagnosis not present

## 2019-08-17 DIAGNOSIS — Z7682 Awaiting organ transplant status: Secondary | ICD-10-CM | POA: Diagnosis not present

## 2019-08-17 DIAGNOSIS — K317 Polyp of stomach and duodenum: Secondary | ICD-10-CM | POA: Diagnosis not present

## 2019-08-17 DIAGNOSIS — M310 Hypersensitivity angiitis: Secondary | ICD-10-CM | POA: Diagnosis not present

## 2019-08-17 DIAGNOSIS — Z01818 Encounter for other preprocedural examination: Secondary | ICD-10-CM | POA: Diagnosis not present

## 2019-08-17 DIAGNOSIS — N186 End stage renal disease: Secondary | ICD-10-CM | POA: Diagnosis not present

## 2019-08-17 DIAGNOSIS — Z1159 Encounter for screening for other viral diseases: Secondary | ICD-10-CM | POA: Diagnosis not present

## 2019-08-17 DIAGNOSIS — K259 Gastric ulcer, unspecified as acute or chronic, without hemorrhage or perforation: Secondary | ICD-10-CM | POA: Diagnosis not present

## 2019-08-20 DIAGNOSIS — M9903 Segmental and somatic dysfunction of lumbar region: Secondary | ICD-10-CM | POA: Diagnosis not present

## 2019-08-20 DIAGNOSIS — M9901 Segmental and somatic dysfunction of cervical region: Secondary | ICD-10-CM | POA: Diagnosis not present

## 2019-08-20 DIAGNOSIS — M9902 Segmental and somatic dysfunction of thoracic region: Secondary | ICD-10-CM | POA: Diagnosis not present

## 2019-08-20 DIAGNOSIS — M5412 Radiculopathy, cervical region: Secondary | ICD-10-CM | POA: Diagnosis not present

## 2019-08-20 DIAGNOSIS — M5441 Lumbago with sciatica, right side: Secondary | ICD-10-CM | POA: Diagnosis not present

## 2019-08-20 DIAGNOSIS — M62830 Muscle spasm of back: Secondary | ICD-10-CM | POA: Diagnosis not present

## 2019-08-21 ENCOUNTER — Other Ambulatory Visit (HOSPITAL_COMMUNITY): Payer: Self-pay | Admitting: Geriatric Medicine

## 2019-08-21 DIAGNOSIS — M5441 Lumbago with sciatica, right side: Secondary | ICD-10-CM | POA: Diagnosis not present

## 2019-08-21 DIAGNOSIS — M9903 Segmental and somatic dysfunction of lumbar region: Secondary | ICD-10-CM | POA: Diagnosis not present

## 2019-08-21 DIAGNOSIS — M62830 Muscle spasm of back: Secondary | ICD-10-CM | POA: Diagnosis not present

## 2019-08-21 DIAGNOSIS — M9901 Segmental and somatic dysfunction of cervical region: Secondary | ICD-10-CM | POA: Diagnosis not present

## 2019-08-21 DIAGNOSIS — M5412 Radiculopathy, cervical region: Secondary | ICD-10-CM | POA: Diagnosis not present

## 2019-08-21 DIAGNOSIS — M9902 Segmental and somatic dysfunction of thoracic region: Secondary | ICD-10-CM | POA: Diagnosis not present

## 2019-08-21 DIAGNOSIS — Z01810 Encounter for preprocedural cardiovascular examination: Secondary | ICD-10-CM

## 2019-08-22 DIAGNOSIS — M9903 Segmental and somatic dysfunction of lumbar region: Secondary | ICD-10-CM | POA: Diagnosis not present

## 2019-08-22 DIAGNOSIS — M5441 Lumbago with sciatica, right side: Secondary | ICD-10-CM | POA: Diagnosis not present

## 2019-08-22 DIAGNOSIS — M9901 Segmental and somatic dysfunction of cervical region: Secondary | ICD-10-CM | POA: Diagnosis not present

## 2019-08-22 DIAGNOSIS — M62830 Muscle spasm of back: Secondary | ICD-10-CM | POA: Diagnosis not present

## 2019-08-22 DIAGNOSIS — M9902 Segmental and somatic dysfunction of thoracic region: Secondary | ICD-10-CM | POA: Diagnosis not present

## 2019-08-22 DIAGNOSIS — M5412 Radiculopathy, cervical region: Secondary | ICD-10-CM | POA: Diagnosis not present

## 2019-08-27 DIAGNOSIS — M9902 Segmental and somatic dysfunction of thoracic region: Secondary | ICD-10-CM | POA: Diagnosis not present

## 2019-08-27 DIAGNOSIS — M5412 Radiculopathy, cervical region: Secondary | ICD-10-CM | POA: Diagnosis not present

## 2019-08-27 DIAGNOSIS — M62830 Muscle spasm of back: Secondary | ICD-10-CM | POA: Diagnosis not present

## 2019-08-27 DIAGNOSIS — M9901 Segmental and somatic dysfunction of cervical region: Secondary | ICD-10-CM | POA: Diagnosis not present

## 2019-08-27 DIAGNOSIS — M9903 Segmental and somatic dysfunction of lumbar region: Secondary | ICD-10-CM | POA: Diagnosis not present

## 2019-08-27 DIAGNOSIS — M5441 Lumbago with sciatica, right side: Secondary | ICD-10-CM | POA: Diagnosis not present

## 2019-08-28 DIAGNOSIS — M5412 Radiculopathy, cervical region: Secondary | ICD-10-CM | POA: Diagnosis not present

## 2019-08-28 DIAGNOSIS — M9901 Segmental and somatic dysfunction of cervical region: Secondary | ICD-10-CM | POA: Diagnosis not present

## 2019-08-28 DIAGNOSIS — M9902 Segmental and somatic dysfunction of thoracic region: Secondary | ICD-10-CM | POA: Diagnosis not present

## 2019-08-28 DIAGNOSIS — M62830 Muscle spasm of back: Secondary | ICD-10-CM | POA: Diagnosis not present

## 2019-08-28 DIAGNOSIS — M9903 Segmental and somatic dysfunction of lumbar region: Secondary | ICD-10-CM | POA: Diagnosis not present

## 2019-08-28 DIAGNOSIS — M5441 Lumbago with sciatica, right side: Secondary | ICD-10-CM | POA: Diagnosis not present

## 2019-08-29 ENCOUNTER — Telehealth (HOSPITAL_COMMUNITY): Payer: Self-pay | Admitting: Radiology

## 2019-08-29 DIAGNOSIS — Z01818 Encounter for other preprocedural examination: Secondary | ICD-10-CM | POA: Diagnosis not present

## 2019-08-29 DIAGNOSIS — Z87891 Personal history of nicotine dependence: Secondary | ICD-10-CM | POA: Diagnosis not present

## 2019-08-29 DIAGNOSIS — I10 Essential (primary) hypertension: Secondary | ICD-10-CM | POA: Diagnosis not present

## 2019-08-29 DIAGNOSIS — N186 End stage renal disease: Secondary | ICD-10-CM | POA: Diagnosis not present

## 2019-08-29 DIAGNOSIS — M310 Hypersensitivity angiitis: Secondary | ICD-10-CM | POA: Diagnosis not present

## 2019-08-29 DIAGNOSIS — Z992 Dependence on renal dialysis: Secondary | ICD-10-CM | POA: Diagnosis not present

## 2019-08-29 DIAGNOSIS — Z7682 Awaiting organ transplant status: Secondary | ICD-10-CM | POA: Diagnosis not present

## 2019-08-29 DIAGNOSIS — I150 Renovascular hypertension: Secondary | ICD-10-CM | POA: Diagnosis not present

## 2019-08-29 DIAGNOSIS — N200 Calculus of kidney: Secondary | ICD-10-CM | POA: Diagnosis not present

## 2019-08-29 NOTE — Telephone Encounter (Signed)
Returned Cristina Wilson's call. I left a message. I have rescheduled her to Monday, August 13 at 1 pm for her echocardiogram and 2 pm for her stress echocardiogram. She will need to arrive at 12:45 for her appointments. If she arrives late she will need to reschedule. She will receive instructions from [place]. Follow those instructions not the phone tree.

## 2019-09-03 DIAGNOSIS — M62830 Muscle spasm of back: Secondary | ICD-10-CM | POA: Diagnosis not present

## 2019-09-03 DIAGNOSIS — M9902 Segmental and somatic dysfunction of thoracic region: Secondary | ICD-10-CM | POA: Diagnosis not present

## 2019-09-03 DIAGNOSIS — M5441 Lumbago with sciatica, right side: Secondary | ICD-10-CM | POA: Diagnosis not present

## 2019-09-03 DIAGNOSIS — M9901 Segmental and somatic dysfunction of cervical region: Secondary | ICD-10-CM | POA: Diagnosis not present

## 2019-09-03 DIAGNOSIS — M5412 Radiculopathy, cervical region: Secondary | ICD-10-CM | POA: Diagnosis not present

## 2019-09-03 DIAGNOSIS — M9903 Segmental and somatic dysfunction of lumbar region: Secondary | ICD-10-CM | POA: Diagnosis not present

## 2019-09-04 DIAGNOSIS — M9901 Segmental and somatic dysfunction of cervical region: Secondary | ICD-10-CM | POA: Diagnosis not present

## 2019-09-04 DIAGNOSIS — M9903 Segmental and somatic dysfunction of lumbar region: Secondary | ICD-10-CM | POA: Diagnosis not present

## 2019-09-04 DIAGNOSIS — M62830 Muscle spasm of back: Secondary | ICD-10-CM | POA: Diagnosis not present

## 2019-09-04 DIAGNOSIS — M9902 Segmental and somatic dysfunction of thoracic region: Secondary | ICD-10-CM | POA: Diagnosis not present

## 2019-09-04 DIAGNOSIS — Z992 Dependence on renal dialysis: Secondary | ICD-10-CM | POA: Diagnosis not present

## 2019-09-04 DIAGNOSIS — M5412 Radiculopathy, cervical region: Secondary | ICD-10-CM | POA: Diagnosis not present

## 2019-09-04 DIAGNOSIS — N186 End stage renal disease: Secondary | ICD-10-CM | POA: Diagnosis not present

## 2019-09-04 DIAGNOSIS — N019 Rapidly progressive nephritic syndrome with unspecified morphologic changes: Secondary | ICD-10-CM | POA: Diagnosis not present

## 2019-09-04 DIAGNOSIS — M5441 Lumbago with sciatica, right side: Secondary | ICD-10-CM | POA: Diagnosis not present

## 2019-09-05 DIAGNOSIS — M5441 Lumbago with sciatica, right side: Secondary | ICD-10-CM | POA: Diagnosis not present

## 2019-09-05 DIAGNOSIS — M9902 Segmental and somatic dysfunction of thoracic region: Secondary | ICD-10-CM | POA: Diagnosis not present

## 2019-09-05 DIAGNOSIS — M5412 Radiculopathy, cervical region: Secondary | ICD-10-CM | POA: Diagnosis not present

## 2019-09-05 DIAGNOSIS — M9903 Segmental and somatic dysfunction of lumbar region: Secondary | ICD-10-CM | POA: Diagnosis not present

## 2019-09-05 DIAGNOSIS — M9901 Segmental and somatic dysfunction of cervical region: Secondary | ICD-10-CM | POA: Diagnosis not present

## 2019-09-05 DIAGNOSIS — M62830 Muscle spasm of back: Secondary | ICD-10-CM | POA: Diagnosis not present

## 2019-09-06 DIAGNOSIS — M310 Hypersensitivity angiitis: Secondary | ICD-10-CM | POA: Diagnosis not present

## 2019-09-06 DIAGNOSIS — D509 Iron deficiency anemia, unspecified: Secondary | ICD-10-CM | POA: Diagnosis not present

## 2019-09-06 DIAGNOSIS — Z992 Dependence on renal dialysis: Secondary | ICD-10-CM | POA: Diagnosis not present

## 2019-09-06 DIAGNOSIS — D631 Anemia in chronic kidney disease: Secondary | ICD-10-CM | POA: Diagnosis not present

## 2019-09-06 DIAGNOSIS — D688 Other specified coagulation defects: Secondary | ICD-10-CM | POA: Diagnosis not present

## 2019-09-06 DIAGNOSIS — N2581 Secondary hyperparathyroidism of renal origin: Secondary | ICD-10-CM | POA: Diagnosis not present

## 2019-09-06 DIAGNOSIS — N186 End stage renal disease: Secondary | ICD-10-CM | POA: Diagnosis not present

## 2019-09-07 ENCOUNTER — Other Ambulatory Visit (HOSPITAL_COMMUNITY): Payer: Medicare HMO

## 2019-09-11 ENCOUNTER — Other Ambulatory Visit (HOSPITAL_COMMUNITY): Payer: Medicare HMO

## 2019-09-14 ENCOUNTER — Encounter (HOSPITAL_COMMUNITY): Payer: Self-pay | Admitting: *Deleted

## 2019-09-14 ENCOUNTER — Other Ambulatory Visit (HOSPITAL_COMMUNITY)
Admission: RE | Admit: 2019-09-14 | Discharge: 2019-09-14 | Disposition: A | Discharge status: Home / Self Care | Payer: Medicare HMO | Source: Ambulatory Visit | Attending: Geriatric Medicine | Admitting: Geriatric Medicine

## 2019-09-14 ENCOUNTER — Telehealth (HOSPITAL_COMMUNITY): Payer: Self-pay | Admitting: *Deleted

## 2019-09-14 DIAGNOSIS — Z961 Presence of intraocular lens: Secondary | ICD-10-CM | POA: Diagnosis not present

## 2019-09-14 DIAGNOSIS — Z20822 Contact with and (suspected) exposure to covid-19: Secondary | ICD-10-CM | POA: Insufficient documentation

## 2019-09-14 DIAGNOSIS — Z01812 Encounter for preprocedural laboratory examination: Secondary | ICD-10-CM | POA: Insufficient documentation

## 2019-09-14 LAB — SARS CORONAVIRUS 2 (TAT 6-24 HRS): SARS Coronavirus 2: NEGATIVE

## 2019-09-14 NOTE — Telephone Encounter (Signed)
Reminder letter with instructions sent via MyChart for stress echo scheduled on 09/17/19 @ 2:00pm.  Patient to be here at 1:30 pm.

## 2019-09-17 ENCOUNTER — Ambulatory Visit (HOSPITAL_BASED_OUTPATIENT_CLINIC_OR_DEPARTMENT_OTHER): Payer: Medicare HMO

## 2019-09-17 ENCOUNTER — Ambulatory Visit (HOSPITAL_COMMUNITY): Payer: Medicare HMO | Attending: Cardiology

## 2019-09-17 ENCOUNTER — Ambulatory Visit (HOSPITAL_COMMUNITY): Payer: Medicare HMO

## 2019-09-17 ENCOUNTER — Other Ambulatory Visit: Payer: Self-pay

## 2019-09-17 DIAGNOSIS — Z01810 Encounter for preprocedural cardiovascular examination: Secondary | ICD-10-CM | POA: Insufficient documentation

## 2019-09-17 LAB — ECHOCARDIOGRAM COMPLETE
Area-P 1/2: 2.75 cm2
S' Lateral: 3.3 cm

## 2019-09-18 DIAGNOSIS — N186 End stage renal disease: Secondary | ICD-10-CM | POA: Diagnosis not present

## 2019-09-18 DIAGNOSIS — R87610 Atypical squamous cells of undetermined significance on cytologic smear of cervix (ASC-US): Secondary | ICD-10-CM | POA: Diagnosis not present

## 2019-09-18 DIAGNOSIS — J3089 Other allergic rhinitis: Secondary | ICD-10-CM | POA: Diagnosis not present

## 2019-09-19 ENCOUNTER — Other Ambulatory Visit: Payer: Self-pay | Admitting: Neurosurgery

## 2019-09-19 DIAGNOSIS — M62830 Muscle spasm of back: Secondary | ICD-10-CM | POA: Diagnosis not present

## 2019-09-19 DIAGNOSIS — I671 Cerebral aneurysm, nonruptured: Secondary | ICD-10-CM

## 2019-09-19 DIAGNOSIS — M9901 Segmental and somatic dysfunction of cervical region: Secondary | ICD-10-CM | POA: Diagnosis not present

## 2019-09-19 DIAGNOSIS — M5441 Lumbago with sciatica, right side: Secondary | ICD-10-CM | POA: Diagnosis not present

## 2019-09-19 DIAGNOSIS — M5412 Radiculopathy, cervical region: Secondary | ICD-10-CM | POA: Diagnosis not present

## 2019-09-19 DIAGNOSIS — M9902 Segmental and somatic dysfunction of thoracic region: Secondary | ICD-10-CM | POA: Diagnosis not present

## 2019-09-19 DIAGNOSIS — M9903 Segmental and somatic dysfunction of lumbar region: Secondary | ICD-10-CM | POA: Diagnosis not present

## 2019-09-24 DIAGNOSIS — M9902 Segmental and somatic dysfunction of thoracic region: Secondary | ICD-10-CM | POA: Diagnosis not present

## 2019-09-24 DIAGNOSIS — M65311 Trigger thumb, right thumb: Secondary | ICD-10-CM | POA: Diagnosis not present

## 2019-09-24 DIAGNOSIS — M5441 Lumbago with sciatica, right side: Secondary | ICD-10-CM | POA: Diagnosis not present

## 2019-09-24 DIAGNOSIS — M62830 Muscle spasm of back: Secondary | ICD-10-CM | POA: Diagnosis not present

## 2019-09-24 DIAGNOSIS — M1811 Unilateral primary osteoarthritis of first carpometacarpal joint, right hand: Secondary | ICD-10-CM | POA: Diagnosis not present

## 2019-09-24 DIAGNOSIS — M9903 Segmental and somatic dysfunction of lumbar region: Secondary | ICD-10-CM | POA: Diagnosis not present

## 2019-09-24 DIAGNOSIS — M9901 Segmental and somatic dysfunction of cervical region: Secondary | ICD-10-CM | POA: Diagnosis not present

## 2019-09-24 DIAGNOSIS — M5412 Radiculopathy, cervical region: Secondary | ICD-10-CM | POA: Diagnosis not present

## 2019-09-24 DIAGNOSIS — M65331 Trigger finger, right middle finger: Secondary | ICD-10-CM | POA: Diagnosis not present

## 2019-09-26 ENCOUNTER — Inpatient Hospital Stay: Admission: RE | Admit: 2019-09-26 | Payer: Medicare HMO | Source: Ambulatory Visit

## 2019-09-26 ENCOUNTER — Other Ambulatory Visit: Payer: Self-pay | Admitting: Gastroenterology

## 2019-09-26 DIAGNOSIS — M5412 Radiculopathy, cervical region: Secondary | ICD-10-CM | POA: Diagnosis not present

## 2019-09-26 DIAGNOSIS — Z1211 Encounter for screening for malignant neoplasm of colon: Secondary | ICD-10-CM | POA: Diagnosis not present

## 2019-09-26 DIAGNOSIS — M9903 Segmental and somatic dysfunction of lumbar region: Secondary | ICD-10-CM | POA: Diagnosis not present

## 2019-09-26 DIAGNOSIS — M62830 Muscle spasm of back: Secondary | ICD-10-CM | POA: Diagnosis not present

## 2019-09-26 DIAGNOSIS — M9901 Segmental and somatic dysfunction of cervical region: Secondary | ICD-10-CM | POA: Diagnosis not present

## 2019-09-26 DIAGNOSIS — M5441 Lumbago with sciatica, right side: Secondary | ICD-10-CM | POA: Diagnosis not present

## 2019-09-26 DIAGNOSIS — R1030 Lower abdominal pain, unspecified: Secondary | ICD-10-CM | POA: Diagnosis not present

## 2019-09-26 DIAGNOSIS — M9902 Segmental and somatic dysfunction of thoracic region: Secondary | ICD-10-CM | POA: Diagnosis not present

## 2019-09-26 DIAGNOSIS — R140 Abdominal distension (gaseous): Secondary | ICD-10-CM | POA: Diagnosis not present

## 2019-10-01 ENCOUNTER — Ambulatory Visit
Admission: RE | Admit: 2019-10-01 | Discharge: 2019-10-01 | Disposition: A | Discharge status: Home / Self Care | Payer: Medicare HMO | Source: Ambulatory Visit | Attending: Neurosurgery | Admitting: Neurosurgery

## 2019-10-01 ENCOUNTER — Other Ambulatory Visit: Payer: Self-pay

## 2019-10-01 DIAGNOSIS — I671 Cerebral aneurysm, nonruptured: Secondary | ICD-10-CM | POA: Diagnosis not present

## 2019-10-01 DIAGNOSIS — Z7682 Awaiting organ transplant status: Secondary | ICD-10-CM | POA: Diagnosis not present

## 2019-10-02 ENCOUNTER — Other Ambulatory Visit (HOSPITAL_COMMUNITY): Payer: Medicare HMO

## 2019-10-03 DIAGNOSIS — M5441 Lumbago with sciatica, right side: Secondary | ICD-10-CM | POA: Diagnosis not present

## 2019-10-03 DIAGNOSIS — M9901 Segmental and somatic dysfunction of cervical region: Secondary | ICD-10-CM | POA: Diagnosis not present

## 2019-10-03 DIAGNOSIS — M62830 Muscle spasm of back: Secondary | ICD-10-CM | POA: Diagnosis not present

## 2019-10-03 DIAGNOSIS — M9902 Segmental and somatic dysfunction of thoracic region: Secondary | ICD-10-CM | POA: Diagnosis not present

## 2019-10-03 DIAGNOSIS — M9903 Segmental and somatic dysfunction of lumbar region: Secondary | ICD-10-CM | POA: Diagnosis not present

## 2019-10-03 DIAGNOSIS — M5412 Radiculopathy, cervical region: Secondary | ICD-10-CM | POA: Diagnosis not present

## 2019-10-04 DIAGNOSIS — N186 End stage renal disease: Secondary | ICD-10-CM | POA: Diagnosis not present

## 2019-10-04 DIAGNOSIS — N019 Rapidly progressive nephritic syndrome with unspecified morphologic changes: Secondary | ICD-10-CM | POA: Diagnosis not present

## 2019-10-04 DIAGNOSIS — Z992 Dependence on renal dialysis: Secondary | ICD-10-CM | POA: Diagnosis not present

## 2019-10-06 DIAGNOSIS — Z992 Dependence on renal dialysis: Secondary | ICD-10-CM | POA: Diagnosis not present

## 2019-10-06 DIAGNOSIS — N186 End stage renal disease: Secondary | ICD-10-CM | POA: Diagnosis not present

## 2019-10-06 DIAGNOSIS — Z111 Encounter for screening for respiratory tuberculosis: Secondary | ICD-10-CM | POA: Diagnosis not present

## 2019-10-06 DIAGNOSIS — N2581 Secondary hyperparathyroidism of renal origin: Secondary | ICD-10-CM | POA: Diagnosis not present

## 2019-10-06 DIAGNOSIS — M310 Hypersensitivity angiitis: Secondary | ICD-10-CM | POA: Diagnosis not present

## 2019-10-06 DIAGNOSIS — D509 Iron deficiency anemia, unspecified: Secondary | ICD-10-CM | POA: Diagnosis not present

## 2019-10-06 DIAGNOSIS — D688 Other specified coagulation defects: Secondary | ICD-10-CM | POA: Diagnosis not present

## 2019-10-06 DIAGNOSIS — R52 Pain, unspecified: Secondary | ICD-10-CM | POA: Diagnosis not present

## 2019-10-08 DIAGNOSIS — I671 Cerebral aneurysm, nonruptured: Secondary | ICD-10-CM | POA: Diagnosis not present

## 2019-10-08 DIAGNOSIS — M65331 Trigger finger, right middle finger: Secondary | ICD-10-CM | POA: Diagnosis not present

## 2019-10-08 DIAGNOSIS — M65311 Trigger thumb, right thumb: Secondary | ICD-10-CM | POA: Diagnosis not present

## 2019-10-09 DIAGNOSIS — Z111 Encounter for screening for respiratory tuberculosis: Secondary | ICD-10-CM | POA: Diagnosis not present

## 2019-10-09 DIAGNOSIS — R52 Pain, unspecified: Secondary | ICD-10-CM | POA: Diagnosis not present

## 2019-10-09 DIAGNOSIS — D509 Iron deficiency anemia, unspecified: Secondary | ICD-10-CM | POA: Diagnosis not present

## 2019-10-09 DIAGNOSIS — Z992 Dependence on renal dialysis: Secondary | ICD-10-CM | POA: Diagnosis not present

## 2019-10-09 DIAGNOSIS — M310 Hypersensitivity angiitis: Secondary | ICD-10-CM | POA: Diagnosis not present

## 2019-10-09 DIAGNOSIS — N186 End stage renal disease: Secondary | ICD-10-CM | POA: Diagnosis not present

## 2019-10-09 DIAGNOSIS — N2581 Secondary hyperparathyroidism of renal origin: Secondary | ICD-10-CM | POA: Diagnosis not present

## 2019-10-09 DIAGNOSIS — D688 Other specified coagulation defects: Secondary | ICD-10-CM | POA: Diagnosis not present

## 2019-10-11 DIAGNOSIS — D509 Iron deficiency anemia, unspecified: Secondary | ICD-10-CM | POA: Diagnosis not present

## 2019-10-11 DIAGNOSIS — Z111 Encounter for screening for respiratory tuberculosis: Secondary | ICD-10-CM | POA: Diagnosis not present

## 2019-10-11 DIAGNOSIS — Z992 Dependence on renal dialysis: Secondary | ICD-10-CM | POA: Diagnosis not present

## 2019-10-11 DIAGNOSIS — M310 Hypersensitivity angiitis: Secondary | ICD-10-CM | POA: Diagnosis not present

## 2019-10-11 DIAGNOSIS — N186 End stage renal disease: Secondary | ICD-10-CM | POA: Diagnosis not present

## 2019-10-11 DIAGNOSIS — N2581 Secondary hyperparathyroidism of renal origin: Secondary | ICD-10-CM | POA: Diagnosis not present

## 2019-10-11 DIAGNOSIS — R52 Pain, unspecified: Secondary | ICD-10-CM | POA: Diagnosis not present

## 2019-10-11 DIAGNOSIS — D688 Other specified coagulation defects: Secondary | ICD-10-CM | POA: Diagnosis not present

## 2019-10-13 DIAGNOSIS — D509 Iron deficiency anemia, unspecified: Secondary | ICD-10-CM | POA: Diagnosis not present

## 2019-10-13 DIAGNOSIS — R52 Pain, unspecified: Secondary | ICD-10-CM | POA: Diagnosis not present

## 2019-10-13 DIAGNOSIS — D688 Other specified coagulation defects: Secondary | ICD-10-CM | POA: Diagnosis not present

## 2019-10-13 DIAGNOSIS — N186 End stage renal disease: Secondary | ICD-10-CM | POA: Diagnosis not present

## 2019-10-13 DIAGNOSIS — Z992 Dependence on renal dialysis: Secondary | ICD-10-CM | POA: Diagnosis not present

## 2019-10-13 DIAGNOSIS — Z111 Encounter for screening for respiratory tuberculosis: Secondary | ICD-10-CM | POA: Diagnosis not present

## 2019-10-13 DIAGNOSIS — M310 Hypersensitivity angiitis: Secondary | ICD-10-CM | POA: Diagnosis not present

## 2019-10-13 DIAGNOSIS — N2581 Secondary hyperparathyroidism of renal origin: Secondary | ICD-10-CM | POA: Diagnosis not present

## 2019-10-16 DIAGNOSIS — M310 Hypersensitivity angiitis: Secondary | ICD-10-CM | POA: Diagnosis not present

## 2019-10-16 DIAGNOSIS — N186 End stage renal disease: Secondary | ICD-10-CM | POA: Diagnosis not present

## 2019-10-16 DIAGNOSIS — N2581 Secondary hyperparathyroidism of renal origin: Secondary | ICD-10-CM | POA: Diagnosis not present

## 2019-10-16 DIAGNOSIS — Z111 Encounter for screening for respiratory tuberculosis: Secondary | ICD-10-CM | POA: Diagnosis not present

## 2019-10-16 DIAGNOSIS — D688 Other specified coagulation defects: Secondary | ICD-10-CM | POA: Diagnosis not present

## 2019-10-16 DIAGNOSIS — D509 Iron deficiency anemia, unspecified: Secondary | ICD-10-CM | POA: Diagnosis not present

## 2019-10-16 DIAGNOSIS — Z992 Dependence on renal dialysis: Secondary | ICD-10-CM | POA: Diagnosis not present

## 2019-10-16 DIAGNOSIS — R52 Pain, unspecified: Secondary | ICD-10-CM | POA: Diagnosis not present

## 2019-10-18 DIAGNOSIS — Z992 Dependence on renal dialysis: Secondary | ICD-10-CM | POA: Diagnosis not present

## 2019-10-18 DIAGNOSIS — N2581 Secondary hyperparathyroidism of renal origin: Secondary | ICD-10-CM | POA: Diagnosis not present

## 2019-10-18 DIAGNOSIS — D688 Other specified coagulation defects: Secondary | ICD-10-CM | POA: Diagnosis not present

## 2019-10-18 DIAGNOSIS — N186 End stage renal disease: Secondary | ICD-10-CM | POA: Diagnosis not present

## 2019-10-18 DIAGNOSIS — D509 Iron deficiency anemia, unspecified: Secondary | ICD-10-CM | POA: Diagnosis not present

## 2019-10-18 DIAGNOSIS — R52 Pain, unspecified: Secondary | ICD-10-CM | POA: Diagnosis not present

## 2019-10-18 DIAGNOSIS — Z111 Encounter for screening for respiratory tuberculosis: Secondary | ICD-10-CM | POA: Diagnosis not present

## 2019-10-18 DIAGNOSIS — M310 Hypersensitivity angiitis: Secondary | ICD-10-CM | POA: Diagnosis not present

## 2019-10-20 DIAGNOSIS — R52 Pain, unspecified: Secondary | ICD-10-CM | POA: Diagnosis not present

## 2019-10-20 DIAGNOSIS — D688 Other specified coagulation defects: Secondary | ICD-10-CM | POA: Diagnosis not present

## 2019-10-20 DIAGNOSIS — N2581 Secondary hyperparathyroidism of renal origin: Secondary | ICD-10-CM | POA: Diagnosis not present

## 2019-10-20 DIAGNOSIS — D509 Iron deficiency anemia, unspecified: Secondary | ICD-10-CM | POA: Diagnosis not present

## 2019-10-20 DIAGNOSIS — Z111 Encounter for screening for respiratory tuberculosis: Secondary | ICD-10-CM | POA: Diagnosis not present

## 2019-10-20 DIAGNOSIS — M310 Hypersensitivity angiitis: Secondary | ICD-10-CM | POA: Diagnosis not present

## 2019-10-20 DIAGNOSIS — Z992 Dependence on renal dialysis: Secondary | ICD-10-CM | POA: Diagnosis not present

## 2019-10-20 DIAGNOSIS — N186 End stage renal disease: Secondary | ICD-10-CM | POA: Diagnosis not present

## 2019-10-22 DIAGNOSIS — H16223 Keratoconjunctivitis sicca, not specified as Sjogren's, bilateral: Secondary | ICD-10-CM | POA: Diagnosis not present

## 2019-10-23 ENCOUNTER — Emergency Department (HOSPITAL_COMMUNITY): Payer: Medicare HMO

## 2019-10-23 ENCOUNTER — Other Ambulatory Visit: Payer: Self-pay

## 2019-10-23 ENCOUNTER — Inpatient Hospital Stay (HOSPITAL_COMMUNITY)
Admission: EM | Admit: 2019-10-23 | Discharge: 2019-10-27 | DRG: 304 | Disposition: A | Discharge status: Home / Self Care | Payer: Medicare HMO | Attending: Internal Medicine | Admitting: Internal Medicine

## 2019-10-23 ENCOUNTER — Encounter (HOSPITAL_COMMUNITY): Payer: Self-pay | Admitting: Emergency Medicine

## 2019-10-23 DIAGNOSIS — R519 Headache, unspecified: Secondary | ICD-10-CM | POA: Diagnosis not present

## 2019-10-23 DIAGNOSIS — F419 Anxiety disorder, unspecified: Secondary | ICD-10-CM | POA: Diagnosis present

## 2019-10-23 DIAGNOSIS — M310 Hypersensitivity angiitis: Secondary | ICD-10-CM | POA: Diagnosis not present

## 2019-10-23 DIAGNOSIS — D631 Anemia in chronic kidney disease: Secondary | ICD-10-CM | POA: Diagnosis present

## 2019-10-23 DIAGNOSIS — N2581 Secondary hyperparathyroidism of renal origin: Secondary | ICD-10-CM | POA: Diagnosis present

## 2019-10-23 DIAGNOSIS — N186 End stage renal disease: Secondary | ICD-10-CM | POA: Diagnosis not present

## 2019-10-23 DIAGNOSIS — I159 Secondary hypertension, unspecified: Secondary | ICD-10-CM | POA: Diagnosis not present

## 2019-10-23 DIAGNOSIS — Z992 Dependence on renal dialysis: Secondary | ICD-10-CM

## 2019-10-23 DIAGNOSIS — D688 Other specified coagulation defects: Secondary | ICD-10-CM | POA: Diagnosis not present

## 2019-10-23 DIAGNOSIS — E890 Postprocedural hypothyroidism: Secondary | ICD-10-CM | POA: Diagnosis present

## 2019-10-23 DIAGNOSIS — D509 Iron deficiency anemia, unspecified: Secondary | ICD-10-CM | POA: Diagnosis not present

## 2019-10-23 DIAGNOSIS — Z8679 Personal history of other diseases of the circulatory system: Secondary | ICD-10-CM

## 2019-10-23 DIAGNOSIS — I169 Hypertensive crisis, unspecified: Secondary | ICD-10-CM | POA: Diagnosis present

## 2019-10-23 DIAGNOSIS — I959 Hypotension, unspecified: Secondary | ICD-10-CM | POA: Diagnosis not present

## 2019-10-23 DIAGNOSIS — G894 Chronic pain syndrome: Secondary | ICD-10-CM | POA: Diagnosis present

## 2019-10-23 DIAGNOSIS — K219 Gastro-esophageal reflux disease without esophagitis: Secondary | ICD-10-CM | POA: Diagnosis present

## 2019-10-23 DIAGNOSIS — I10 Essential (primary) hypertension: Secondary | ICD-10-CM | POA: Diagnosis not present

## 2019-10-23 DIAGNOSIS — I161 Hypertensive emergency: Principal | ICD-10-CM | POA: Diagnosis present

## 2019-10-23 DIAGNOSIS — G4452 New daily persistent headache (NDPH): Secondary | ICD-10-CM | POA: Diagnosis not present

## 2019-10-23 DIAGNOSIS — G4489 Other headache syndrome: Secondary | ICD-10-CM | POA: Diagnosis not present

## 2019-10-23 DIAGNOSIS — Z87891 Personal history of nicotine dependence: Secondary | ICD-10-CM

## 2019-10-23 DIAGNOSIS — Z79890 Hormone replacement therapy (postmenopausal): Secondary | ICD-10-CM

## 2019-10-23 DIAGNOSIS — I671 Cerebral aneurysm, nonruptured: Secondary | ICD-10-CM

## 2019-10-23 DIAGNOSIS — E8889 Other specified metabolic disorders: Secondary | ICD-10-CM | POA: Diagnosis present

## 2019-10-23 DIAGNOSIS — R52 Pain, unspecified: Secondary | ICD-10-CM | POA: Diagnosis not present

## 2019-10-23 DIAGNOSIS — Z111 Encounter for screening for respiratory tuberculosis: Secondary | ICD-10-CM | POA: Diagnosis not present

## 2019-10-23 DIAGNOSIS — Z20822 Contact with and (suspected) exposure to covid-19: Secondary | ICD-10-CM | POA: Diagnosis present

## 2019-10-23 DIAGNOSIS — E785 Hyperlipidemia, unspecified: Secondary | ICD-10-CM | POA: Diagnosis present

## 2019-10-23 DIAGNOSIS — I129 Hypertensive chronic kidney disease with stage 1 through stage 4 chronic kidney disease, or unspecified chronic kidney disease: Secondary | ICD-10-CM | POA: Diagnosis not present

## 2019-10-23 DIAGNOSIS — I120 Hypertensive chronic kidney disease with stage 5 chronic kidney disease or end stage renal disease: Secondary | ICD-10-CM | POA: Diagnosis present

## 2019-10-23 DIAGNOSIS — Z79899 Other long term (current) drug therapy: Secondary | ICD-10-CM

## 2019-10-23 LAB — COMPREHENSIVE METABOLIC PANEL
ALT: 28 U/L (ref 0–44)
AST: 25 U/L (ref 15–41)
Albumin: 4.1 g/dL (ref 3.5–5.0)
Alkaline Phosphatase: 41 U/L (ref 38–126)
Anion gap: 14 (ref 5–15)
BUN: 27 mg/dL — ABNORMAL HIGH (ref 8–23)
CO2: 30 mmol/L (ref 22–32)
Calcium: 8.6 mg/dL — ABNORMAL LOW (ref 8.9–10.3)
Chloride: 96 mmol/L — ABNORMAL LOW (ref 98–111)
Creatinine, Ser: 5.18 mg/dL — ABNORMAL HIGH (ref 0.44–1.00)
GFR, Estimated: 8 mL/min — ABNORMAL LOW (ref >60)
Glucose, Bld: 100 mg/dL — ABNORMAL HIGH (ref 70–99)
Potassium: 3.5 mmol/L (ref 3.5–5.1)
Sodium: 140 mmol/L (ref 135–145)
Total Bilirubin: 1.1 mg/dL (ref 0.3–1.2)
Total Protein: 7.1 g/dL (ref 6.5–8.1)

## 2019-10-23 LAB — DIFFERENTIAL
Abs Immature Granulocytes: 0.02 10*3/uL (ref 0.00–0.07)
Basophils Absolute: 0.1 10*3/uL (ref 0.0–0.1)
Basophils Relative: 1 %
Eosinophils Absolute: 0.1 10*3/uL (ref 0.0–0.5)
Eosinophils Relative: 3 %
Immature Granulocytes: 0 %
Lymphocytes Relative: 29 %
Lymphs Abs: 1.5 10*3/uL (ref 0.7–4.0)
Monocytes Absolute: 0.3 10*3/uL (ref 0.1–1.0)
Monocytes Relative: 6 %
Neutro Abs: 3.1 10*3/uL (ref 1.7–7.7)
Neutrophils Relative %: 61 %

## 2019-10-23 LAB — APTT: aPTT: 33 seconds (ref 24–36)

## 2019-10-23 LAB — RESPIRATORY PANEL BY RT PCR (FLU A&B, COVID)
Influenza A by PCR: NEGATIVE
Influenza B by PCR: NEGATIVE
SARS Coronavirus 2 by RT PCR: NEGATIVE

## 2019-10-23 LAB — CBC
HCT: 40.3 % (ref 36.0–46.0)
Hemoglobin: 13.2 g/dL (ref 12.0–15.0)
MCH: 31.7 pg (ref 26.0–34.0)
MCHC: 32.8 g/dL (ref 30.0–36.0)
MCV: 96.6 fL (ref 80.0–100.0)
Platelets: 167 10*3/uL (ref 150–400)
RBC: 4.17 MIL/uL (ref 3.87–5.11)
RDW: 14.6 % (ref 11.5–15.5)
WBC: 5.1 10*3/uL (ref 4.0–10.5)
nRBC: 0 % (ref 0.0–0.2)

## 2019-10-23 LAB — CBG MONITORING, ED: Glucose-Capillary: 87 mg/dL (ref 70–99)

## 2019-10-23 LAB — PROTIME-INR
INR: 0.9 (ref 0.8–1.2)
Prothrombin Time: 12.2 seconds (ref 11.4–15.2)

## 2019-10-23 LAB — MRSA PCR SCREENING: MRSA by PCR: NEGATIVE

## 2019-10-23 MED ORDER — HYDROCODONE-ACETAMINOPHEN 5-325 MG PO TABS
1.0000 | ORAL_TABLET | ORAL | Status: DC | PRN
  Administered 2019-10-25: 2 via ORAL
  Administered 2019-10-25: 1 via ORAL
  Administered 2019-10-26: 2 via ORAL
  Filled 2019-10-23: qty 2
  Filled 2019-10-23: qty 1
  Filled 2019-10-23: qty 2

## 2019-10-23 MED ORDER — TRAZODONE HCL 50 MG PO TABS
50.0000 mg | ORAL_TABLET | Freq: Every day | ORAL | Status: DC
  Administered 2019-10-23 – 2019-10-26 (×4): 50 mg via ORAL
  Filled 2019-10-23 (×4): qty 1

## 2019-10-23 MED ORDER — SODIUM CHLORIDE 0.9% FLUSH
3.0000 mL | Freq: Once | INTRAVENOUS | Status: AC
  Administered 2019-10-23: 3 mL via INTRAVENOUS

## 2019-10-23 MED ORDER — LABETALOL HCL 5 MG/ML IV SOLN
20.0000 mg | Freq: Once | INTRAVENOUS | Status: AC
  Administered 2019-10-23: 20 mg via INTRAVENOUS
  Filled 2019-10-23: qty 4

## 2019-10-23 MED ORDER — POLYETHYLENE GLYCOL 3350 17 G PO PACK: 17.0000 g | PACK | Freq: Every day | ORAL | Status: DC | PRN

## 2019-10-23 MED ORDER — METHYLPREDNISOLONE SODIUM SUCC 125 MG IJ SOLR
125.0000 mg | Freq: Once | INTRAMUSCULAR | Status: AC
  Administered 2019-10-23: 125 mg via INTRAVENOUS
  Filled 2019-10-23: qty 2

## 2019-10-23 MED ORDER — DOCUSATE SODIUM 100 MG PO CAPS
100.0000 mg | ORAL_CAPSULE | Freq: Two times a day (BID) | ORAL | Status: DC
  Administered 2019-10-23 – 2019-10-27 (×6): 100 mg via ORAL
  Filled 2019-10-23 (×8): qty 1

## 2019-10-23 MED ORDER — CARVEDILOL 6.25 MG PO TABS
6.2500 mg | ORAL_TABLET | ORAL | Status: DC
  Administered 2019-10-23 – 2019-10-25 (×2): 6.25 mg via ORAL
  Filled 2019-10-23 (×2): qty 1

## 2019-10-23 MED ORDER — CAMPHOR-MENTHOL 0.5-0.5 % EX LOTN
1.0000 "application " | TOPICAL_LOTION | Freq: Three times a day (TID) | CUTANEOUS | Status: DC | PRN
  Filled 2019-10-23: qty 222

## 2019-10-23 MED ORDER — ONDANSETRON HCL 4 MG PO TABS: 4.0000 mg | ORAL_TABLET | Freq: Four times a day (QID) | ORAL | Status: DC | PRN

## 2019-10-23 MED ORDER — MORPHINE SULFATE (PF) 2 MG/ML IV SOLN
2.0000 mg | INTRAVENOUS | Status: DC | PRN
  Administered 2019-10-26: 2 mg via INTRAVENOUS
  Filled 2019-10-23: qty 1

## 2019-10-23 MED ORDER — HYDROXYZINE HCL 25 MG PO TABS: 25.0000 mg | ORAL_TABLET | Freq: Three times a day (TID) | ORAL | Status: DC | PRN

## 2019-10-23 MED ORDER — CARVEDILOL 6.25 MG PO TABS
6.2500 mg | ORAL_TABLET | ORAL | Status: DC
  Administered 2019-10-24 – 2019-10-26 (×3): 6.25 mg via ORAL
  Filled 2019-10-23 (×3): qty 1

## 2019-10-23 MED ORDER — MECLIZINE HCL 25 MG PO TABS: 12.5000 mg | ORAL_TABLET | Freq: Three times a day (TID) | ORAL | Status: DC | PRN

## 2019-10-23 MED ORDER — SODIUM CHLORIDE 0.9% FLUSH
3.0000 mL | Freq: Two times a day (BID) | INTRAVENOUS | Status: DC
  Administered 2019-10-23 – 2019-10-26 (×7): 3 mL via INTRAVENOUS

## 2019-10-23 MED ORDER — CALCIUM CARBONATE ANTACID 1250 MG/5ML PO SUSP
500.0000 mg | Freq: Four times a day (QID) | ORAL | Status: DC | PRN
  Filled 2019-10-23: qty 5

## 2019-10-23 MED ORDER — BISACODYL 5 MG PO TBEC: 5.0000 mg | DELAYED_RELEASE_TABLET | Freq: Every day | ORAL | Status: DC | PRN

## 2019-10-23 MED ORDER — PROCHLORPERAZINE EDISYLATE 10 MG/2ML IJ SOLN
10.0000 mg | Freq: Once | INTRAMUSCULAR | Status: AC
  Administered 2019-10-23: 10 mg via INTRAVENOUS
  Filled 2019-10-23: qty 2

## 2019-10-23 MED ORDER — LATANOPROST 0.005 % OP SOLN
1.0000 [drp] | Freq: Every day | OPHTHALMIC | Status: DC
  Administered 2019-10-23: 1 [drp] via OPHTHALMIC
  Filled 2019-10-23: qty 2.5

## 2019-10-23 MED ORDER — RENA-VITE PO TABS
1.0000 | ORAL_TABLET | Freq: Every day | ORAL | Status: DC
  Administered 2019-10-24 – 2019-10-27 (×3): 1 via ORAL
  Filled 2019-10-23 (×5): qty 1

## 2019-10-23 MED ORDER — ONDANSETRON HCL 4 MG/2ML IJ SOLN
4.0000 mg | Freq: Once | INTRAMUSCULAR | Status: AC
  Administered 2019-10-23: 4 mg via INTRAVENOUS
  Filled 2019-10-23: qty 2

## 2019-10-23 MED ORDER — LORAZEPAM 0.5 MG PO TABS
0.5000 mg | ORAL_TABLET | Freq: Every day | ORAL | Status: DC
  Administered 2019-10-23 – 2019-10-26 (×4): 0.5 mg via ORAL
  Filled 2019-10-23 (×4): qty 1

## 2019-10-23 MED ORDER — NEPRO/CARBSTEADY PO LIQD
237.0000 mL | Freq: Three times a day (TID) | ORAL | Status: DC | PRN
  Filled 2019-10-23: qty 237

## 2019-10-23 MED ORDER — HYDRALAZINE HCL 20 MG/ML IJ SOLN
5.0000 mg | INTRAMUSCULAR | Status: DC | PRN
  Administered 2019-10-24 (×2): 10 mg via INTRAVENOUS
  Administered 2019-10-25 (×2): 20 mg via INTRAVENOUS
  Administered 2019-10-25: 10 mg via INTRAVENOUS
  Administered 2019-10-25 – 2019-10-26 (×2): 20 mg via INTRAVENOUS
  Administered 2019-10-27: 10 mg via INTRAVENOUS
  Filled 2019-10-23 (×8): qty 1

## 2019-10-23 MED ORDER — CARVEDILOL 12.5 MG PO TABS: 6.2500 mg | ORAL_TABLET | Freq: Two times a day (BID) | ORAL | Status: DC

## 2019-10-23 MED ORDER — ZOLPIDEM TARTRATE 5 MG PO TABS
5.0000 mg | ORAL_TABLET | Freq: Every evening | ORAL | Status: DC | PRN
  Administered 2019-10-25 (×2): 5 mg via ORAL
  Filled 2019-10-23 (×2): qty 1

## 2019-10-23 MED ORDER — TRAMADOL HCL 50 MG PO TABS: 50.0000 mg | ORAL_TABLET | Freq: Two times a day (BID) | ORAL | Status: DC | PRN

## 2019-10-23 MED ORDER — ACETAMINOPHEN 325 MG PO TABS
650.0000 mg | ORAL_TABLET | Freq: Four times a day (QID) | ORAL | Status: DC | PRN
  Administered 2019-10-24 – 2019-10-25 (×2): 650 mg via ORAL
  Filled 2019-10-23 (×2): qty 2

## 2019-10-23 MED ORDER — LEVOTHYROXINE SODIUM 25 MCG PO TABS
137.0000 ug | ORAL_TABLET | Freq: Every day | ORAL | Status: DC
  Administered 2019-10-24 – 2019-10-27 (×4): 137 ug via ORAL
  Filled 2019-10-23 (×4): qty 1

## 2019-10-23 MED ORDER — HYDRALAZINE HCL 20 MG/ML IJ SOLN
10.0000 mg | Freq: Once | INTRAMUSCULAR | Status: AC
  Administered 2019-10-23: 10 mg via INTRAVENOUS
  Filled 2019-10-23: qty 1

## 2019-10-23 MED ORDER — DOCUSATE SODIUM 283 MG RE ENEM
1.0000 | ENEMA | RECTAL | Status: DC | PRN
  Filled 2019-10-23: qty 1

## 2019-10-23 MED ORDER — CARVEDILOL 12.5 MG PO TABS
6.2500 mg | ORAL_TABLET | Freq: Once | ORAL | Status: AC
  Administered 2019-10-23: 6.25 mg via ORAL
  Filled 2019-10-23: qty 2

## 2019-10-23 MED ORDER — CLONIDINE HCL 0.1 MG PO TABS
0.1000 mg | ORAL_TABLET | Freq: Once | ORAL | Status: AC
  Administered 2019-10-23: 0.1 mg via ORAL
  Filled 2019-10-23: qty 1

## 2019-10-23 MED ORDER — SUCROFERRIC OXYHYDROXIDE 500 MG PO CHEW
1500.0000 mg | CHEWABLE_TABLET | Freq: Three times a day (TID) | ORAL | Status: DC
  Administered 2019-10-24 – 2019-10-27 (×7): 1500 mg via ORAL
  Filled 2019-10-23 (×13): qty 3

## 2019-10-23 MED ORDER — CARVEDILOL 12.5 MG PO TABS: 6.2500 mg | ORAL_TABLET | ORAL | Status: DC

## 2019-10-23 MED ORDER — FENTANYL CITRATE (PF) 100 MCG/2ML IJ SOLN
50.0000 ug | Freq: Once | INTRAMUSCULAR | Status: AC
  Administered 2019-10-23: 50 ug via INTRAVENOUS
  Filled 2019-10-23: qty 2

## 2019-10-23 MED ORDER — ONDANSETRON HCL 4 MG/2ML IJ SOLN
4.0000 mg | Freq: Four times a day (QID) | INTRAMUSCULAR | Status: DC | PRN
  Administered 2019-10-25 – 2019-10-26 (×3): 4 mg via INTRAVENOUS
  Filled 2019-10-23 (×3): qty 2

## 2019-10-23 MED ORDER — SORBITOL 70 % SOLN
30.0000 mL | Status: DC | PRN
  Filled 2019-10-23: qty 30

## 2019-10-23 MED ORDER — ACETAMINOPHEN 650 MG RE SUPP: 650.0000 mg | Freq: Four times a day (QID) | RECTAL | Status: DC | PRN

## 2019-10-23 MED ORDER — OXYCODONE-ACETAMINOPHEN 5-325 MG PO TABS
2.0000 | ORAL_TABLET | Freq: Once | ORAL | Status: AC
  Administered 2019-10-23: 2 via ORAL
  Filled 2019-10-23: qty 2

## 2019-10-23 NOTE — Consult Note (Signed)
Reason for Consult: Headache Referring Physician: Dr. 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 She presented to the ED today for a 4-5 history of severe right-sided headache. She reported the headache began gradually and progressed into the worst headache of her life. Headache was reported to affect the right occipital region. She endorses photophobia and N/V. She denies nuchal rigidity.   Past Medical History:  Diagnosis Date  . Abdominal pain 06/06/2018  . Acute renal failure (ARF) ([place]) 06/22/2018  . Anemia in chronic kidney disease 08/25/2018  . Anemia, unspecified 07/05/2018  . 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 Anxiety 07/05/2018  . Arthritis   . Bell's palsy 2017   "mild case"  . Cerebral aneurysm 08/2014   pt. states she has 2 aneurysms  . Chronic low back pain 04/12/2017  . Chronic pain syndrome 04/12/2017  . Closed fracture of left distal radius   . De Quervain's tenosynovitis 02/10/2018  . Degeneration of lumbar intervertebral disc 03/01/2017  . End stage renal failure on dialysis [facility])    M/W/F on [employer]  . GERD (gastroesophageal reflux disease)   . Goodpasture syndrome ([place])   . Headache 10/30/2014  . Hyperlipidemia 04/17/2018  . Hypertension   . Hypothyroidism   . Iron deficiency anemia, unspecified 07/12/2018  . PONV (postoperative nausea and vomiting)    violent vomiting  . Sacral back pain 05/25/2016  . Scoliosis of lumbar spine 03/01/2017  . Secondary hyperparathyroidism of renal origin ([place]) 08/22/2018  . 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 Thyroid disease   . Trigger finger of left thumb 02/10/2018    Past Surgical History:  Procedure Laterality Date  . ARTERY BIOPSY Right 10/31/2014   Procedure: BIOPSY TEMPORAL ARTERY-RIGHT;  Surgeon:  Mal Misty, MD;  Location: [facility];  Service: Vascular;  Laterality: Right;  . AV FISTULA PLACEMENT Left 10/11/2018   Procedure: left arm ARTERIOVENOUS (AV) FISTULA  creation;  Surgeon: Waynetta Sandy, MD;  Location: [facility];  Service: Vascular;  Laterality: Left;  . BILATERAL CARPAL TUNNEL RELEASE    . BIOPSY  04/29/2019   Procedure: BIOPSY;  Surgeon: Otis Brace, MD;  [facility];  Service: Gastroenterology;;  . BREAST SURGERY Left    lumpectomy  . BUNIONECTOMY Right   . CHOLECYSTECTOMY N/A 10/13/2016   Procedure: LAPAROSCOPIC CHOLECYSTECTOMY WITH INTRAOPERATIVE CHOLANGIOGRAM;  Surgeon: Donnie Mesa, MD;  Location: [facility];  Service: General;  Laterality: N/A;  . ESOPHAGOGASTRODUODENOSCOPY (EGD) WITH PROPOFOL N/A 04/29/2019   Procedure: ESOPHAGOGASTRODUODENOSCOPY (EGD) WITH PROPOFOL;  Surgeon: Otis Brace, MD;  Location: [facility];  Service: Gastroenterology;  Laterality: N/A;  . EYE SURGERY     surgery for cross eye  . FOOT FRACTURE SURGERY    . OPEN REDUCTION INTERNAL FIXATION (ORIF) DISTAL RADIAL FRACTURE Left 03/29/2017   Procedure: OPEN REDUCTION INTERNAL FIXATION (ORIF)LEFT  DISTAL RADIAL FRACTURE;  Surgeon: Leanora Cover, MD;  Location: [facility];  Service: Orthopedics;  Laterality: Left;  . THYROIDECTOMY    . TONSILLECTOMY    . WISDOM TOOTH EXTRACTION      Family History  Problem Relation Age of Onset  . COPD Mother   . Kidney disease Mother   . Heart disease Mother   . Cancer Father     Social History:  reports that she quit  smoking about 30 years ago. Her smoking use included cigarettes. She quit after 0.50 years of use. She has never used smokeless tobacco. She reports current alcohol use of about 7.0 standard drinks of alcohol per week. She reports previous drug use. Drug: Marijuana.  Allergies:  Allergies  Allergen Reactions  . Sulfa Antibiotics Rash    Medications: I have reviewed the patient's current  medications.  Results for orders placed or performed during the hospital encounter of 10/23/19 (from the past 48 hour(s))  Protime-INR     Status: None   Collection Time: 10/23/19 10:07 AM  Result Value Ref Range   Prothrombin Time 12.2 11.4 - 15.2 seconds   INR 0.9 0.8 - 1.2    Comment: (NOTE) INR goal varies based on device and disease states. Performed at [facility], [place] [address], [place], [place] [address]   APTT     Status: None   Collection Time: 10/23/19 10:07 AM  Result Value Ref Range   aPTT 33 24 - 36 seconds    Comment: Performed at [facility] [address] [place], [place] [address]  CBC     Status: None   Collection Time: 10/23/19 10:07 AM  Result Value Ref Range   WBC 5.1 4.0 - 10.5 K/uL   RBC 4.17 3.87 - 5.11 MIL/uL   Hemoglobin 13.2 12.0 - 15.0 g/dL   HCT 40.3 36 - 46 %   MCV 96.6 80.0 - 100.0 fL   MCH 31.7 26.0 - 34.0 pg   MCHC 32.8 30.0 - 36.0 g/dL   RDW 14.6 11.5 - 15.5 %   Platelets 167 150 - 400 K/uL   nRBC 0.0 0.0 - 0.2 %    Comment: Performed at [facility], [place] [address] [place], [place] [address]  Differential     Status: None   Collection Time: 10/23/19 10:07 AM  Result Value Ref Range   Neutrophils Relative % 61 %   Neutro Abs 3.1 1.7 - 7.7 K/uL   Lymphocytes Relative 29 %   Lymphs Abs 1.5 0.7 - 4.0 K/uL   Monocytes Relative 6 %   Monocytes Absolute 0.3 0.1 - 1.0 K/uL   Eosinophils Relative 3 %   Eosinophils Absolute 0.1 0.0 - 0.5 K/uL   Basophils Relative 1 %   Basophils Absolute 0.1 0.0 - 0.1 K/uL   Immature Granulocytes 0 %   Abs Immature Granulocytes 0.02 0.00 - 0.07 K/uL    Comment: Performed at [facility], [place] [address] [place], [place] [address]  Comprehensive metabolic panel     Status: Abnormal   Collection Time: 10/23/19 10:07 AM  Result Value Ref Range   Sodium 140 135 - 145 mmol/L   Potassium 3.5 3.5 - 5.1 mmol/L   Chloride 96 (L) 98 - 111 mmol/L   CO2 30 22 - 32 mmol/L   Glucose, Bld  100 (H) 70 - 99 mg/dL    Comment: Glucose reference range applies only to samples taken after fasting for at least 8 hours.   BUN 27 (H) 8 - 23 mg/dL   Creatinine, Ser 5.18 (H) 0.44 - 1.00 mg/dL   Calcium 8.6 (L) 8.9 - 10.3 mg/dL   Total Protein 7.1 6.5 - 8.1 g/dL   Albumin 4.1 3.5 - 5.0 g/dL   AST 25 15 - 41 U/L   ALT 28 0 - 44 U/L   Alkaline Phosphatase 41 38 - 126 U/L   Total Bilirubin  1.1 0.3 - 1.2 mg/dL   GFR, Estimated 8 (L) >60 mL/min   Anion gap 14 5 - 15    Comment: Performed at [facility] [address] [place], [place] [address]  CBG monitoring, ED     Status: None   Collection Time: 10/23/19 10:20 AM  Result Value Ref Range   Glucose-Capillary 87 70 - 99 mg/dL    Comment: Glucose reference range applies only to samples taken after fasting for at least 8 hours.   Comment 1 Notify RN    Comment 2 Document in Chart     CT HEAD WO CONTRAST  Result Date: 10/23/2019 CLINICAL DATA:  Headache, nausea, vomiting EXAM: CT HEAD WITHOUT CONTRAST TECHNIQUE: Contiguous axial images were obtained from the base of the skull through the vertex without intravenous contrast. COMPARISON:  None. FINDINGS: Brain: Normal anatomic configuration. No abnormal intra or extra-axial mass lesion or fluid collection. No abnormal mass effect or midline shift. No evidence of acute intracranial hemorrhage or infarct. Ventricular size is normal. Cerebellum unremarkable. Vascular: Unremarkable Skull: Intact Sinuses/Orbits: Paranasal sinuses are clear. Orbits are unremarkable. Other: Mastoid air cells and middle ear cavities are clear. IMPRESSION: Normal examination Electronically Signed   By: Fidela Salisbury MD   On: 10/23/2019 11:03    Review of Systems Blood pressure (!) 168/82, pulse 76, temperature 97.7 F (36.5 C), temperature source Oral, resp. rate 11, SpO2 98 %. Physical Exam Constitutional:      Appearance: Normal appearance. She is well-developed and normal weight.  HENT:     Head:  Normocephalic and atraumatic.  Eyes:     General: No visual field deficit.    Extraocular Movements: Extraocular movements intact.     Pupils: Pupils are equal, round, and reactive to light.  Musculoskeletal:     Cervical back: Normal range of motion and neck supple.  Neurological:     General: No focal deficit present.     Mental Status: She is alert and oriented to person, place, and time. Mental status is at baseline.     GCS: GCS eye subscore is 4. GCS verbal subscore is 5. GCS motor subscore is 6.     Cranial Nerves: Cranial nerves are intact. No cranial nerve deficit, dysarthria or facial asymmetry.     Sensory: Sensation is intact.     Motor: Motor function is intact.     Coordination: Coordination is intact.  Psychiatric:        Attention and Perception: Attention and perception normal.        Mood and Affect: Mood and affect normal.        Behavior: Behavior normal. Behavior is cooperative.        Thought Content: Thought content normal.        Cognition and Memory: Cognition and memory normal.        Judgment: Judgment normal.     Assessment/Plan: 69 y.o. 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 Her last MRA on 10/01/19 showed the aneurysms to be stable. Today she presented to the ED for a gradual onset of severe right occipital radiating headache. Upon my assessment, the patient had a SBP in the 240's and was reported to be hypertensive over the last week. She reported a severe headache that was much improved after administration of Percocet. She is neuro intact and at her baseline. She does have photophobia, N/V, and  severe headache. She could have had a sentinel hemorrhage or SAH at the onset of her headache with no evidence of hemorrhage on her current CT head. However, given the negative CT, intact neuro exam, and recent history of severe hypertension, the suspicion for a  ruptured aneurysm is low and is more likely that the patient is having a hypertensive crisis. Recommend admitting to the neuro progressive care unit for observation and aggressive blood pressure control. Treat headache as non SAH headache. Will repeat MRI/MRA. Dr. Kathyrn Sheriff to see the patient on Thursday. Maintain SBP <140.   Cristina Moeller, DNP, NP-C 10/23/2019, 4:29 PM

## 2019-10-23 NOTE — ED Provider Notes (Addendum)
[facility] EMERGENCY DEPARTMENT Provider Note   CSN: [id number] Arrival date & time: 10/23/19  4193     History Chief Complaint  Patient presents with  . Headache  . Hypertension    Cristina Wilson is a 69 y.o. female.  HPI  69 year old female history of end-stage renal disease, on dialysis, history of Goodpasture's syndrome, history of 3 aneurysms being followed, presents today with right-sided headache.  Headache began at the base of the skull on the right and radiates into the right side of her head.  This is been present for 4 to 5 days.  She has been hypertensive during this time.  She went to dialysis today.  They stopped early due to high blood pressure and sent her to her doctor who sent her on to the ED for evaluation.  She denies any fever but is having some chills which she thinks is secondary to the cool room.  She has had some nausea and vomiting.  Patient has received 3 Covid vaccinations.     Past Medical History:  Diagnosis Date  . Abdominal pain 06/06/2018  . Acute renal failure (ARF) ([place]) 06/22/2018  . Anemia in chronic kidney disease 08/25/2018  . Anemia, unspecified 07/05/2018  . 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 Anxiety 07/05/2018  . Arthritis   . Bell's palsy 2017   "mild case"  . Cerebral aneurysm 08/2014   pt. states she has 2 aneurysms  . Chronic low back pain 04/12/2017  . Chronic pain syndrome 04/12/2017  . Closed fracture of left distal radius   . De Quervain's tenosynovitis 02/10/2018  . Degeneration of lumbar intervertebral disc 03/01/2017  . Disease of thyroid gland 04/17/2018  . End stage renal disease ([place]) 08/25/2018  . End stage renal failure on dialysis [facility])    M/W/F on [employer]  . GERD (gastroesophageal reflux disease)   . Goodpasture syndrome ([place])   . Headache 10/30/2014  . Hyperlipidemia 04/17/2018  . Hypertension   . Hypothyroidism   . Hypoxemia 02/21/2019  . Iron deficiency  anemia, unspecified 07/12/2018  . 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 PONV (postoperative nausea and vomiting)    violent vomiting  . Pulmonary edema 02/27/2019  . Referred otalgia of left ear 08/16/2018  . Sacral back pain 05/25/2016  . Scoliosis of lumbar spine 03/01/2017  . Secondary hyperparathyroidism of renal origin ([place]) 08/22/2018  . Shortness of breath 07/06/2018  . 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 Thyroid disease   . Trigger finger of left thumb 02/10/2018    Patient Active Problem List   Diagnosis Date Noted  . 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 Hypoxemia 02/21/2019  . Pulmonary edema 02/06/2019  . Encounter for immunization 09/26/2018  . Anemia in chronic kidney disease 08/25/2018  . End stage renal disease ([place]) 08/25/2018  . Secondary hyperparathyroidism of renal origin ([place]) 08/22/2018  . Referred otalgia of left ear 08/16/2018  . Iron deficiency anemia, unspecified 07/12/2018  . Shortness of breath 07/06/2018  . Anemia, unspecified 07/05/2018  . Anxiety 07/05/2018  . 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 Acute renal failure (ARF) ([place]) 06/22/2018  . 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 Abdominal pain 06/06/2018  . Hydronephrosis due to obstruction of ureteral orifice 06/06/2018  . Disease of thyroid gland 04/17/2018  . Hyperlipidemia 04/17/2018  . Hypertension 04/17/2018  . De Quervain's tenosynovitis 02/10/2018  . Trigger finger of left thumb 02/10/2018  . Chronic low  back pain 04/12/2017  . Chronic pain syndrome 04/12/2017  . Degeneration of lumbar intervertebral disc 03/01/2017  . Scoliosis of lumbar spine 03/01/2017  . Sacral back pain 05/25/2016  . Eye pain 10/30/2014    Past Surgical History:  Procedure Laterality Date  . ARTERY BIOPSY Right 10/31/2014   Procedure: BIOPSY TEMPORAL ARTERY-RIGHT;  Surgeon: Mal Misty, MD;  Location: [facility];  Service: Vascular;   Laterality: Right;  . AV FISTULA PLACEMENT Left 10/11/2018   Procedure: left arm ARTERIOVENOUS (AV) FISTULA  creation;  Surgeon: Waynetta Sandy, MD;  Location: [facility];  Service: Vascular;  Laterality: Left;  . BILATERAL CARPAL TUNNEL RELEASE    . BIOPSY  04/29/2019   Procedure: BIOPSY;  Surgeon: Otis Brace, MD;  [facility];  Service: Gastroenterology;;  . BREAST SURGERY Left    lumpectomy  . BUNIONECTOMY Right   . CHOLECYSTECTOMY N/A 10/13/2016   Procedure: LAPAROSCOPIC CHOLECYSTECTOMY WITH INTRAOPERATIVE CHOLANGIOGRAM;  Surgeon: Donnie Mesa, MD;  Location: [facility];  Service: General;  Laterality: N/A;  . ESOPHAGOGASTRODUODENOSCOPY (EGD) WITH PROPOFOL N/A 04/29/2019   Procedure: ESOPHAGOGASTRODUODENOSCOPY (EGD) WITH PROPOFOL;  Surgeon: Otis Brace, MD;  Location: [facility];  Service: Gastroenterology;  Laterality: N/A;  . EYE SURGERY     surgery for cross eye  . FOOT FRACTURE SURGERY    . OPEN REDUCTION INTERNAL FIXATION (ORIF) DISTAL RADIAL FRACTURE Left 03/29/2017   Procedure: OPEN REDUCTION INTERNAL FIXATION (ORIF)LEFT  DISTAL RADIAL FRACTURE;  Surgeon: Leanora Cover, MD;  Location: [facility];  Service: Orthopedics;  Laterality: Left;  . THYROIDECTOMY    . TONSILLECTOMY    . WISDOM TOOTH EXTRACTION       OB History   No obstetric history on file.     Family History  Problem Relation Age of Onset  . COPD Mother   . Kidney disease Mother   . Heart disease Mother   . Cancer Father     Social History   Tobacco Use  . Smoking status: Former Smoker    Years: 0.50    Types: Cigarettes    Quit date: 08/07/1989    Years since quitting: 30.2  . Smokeless tobacco: Never Used  Vaping Use  . Vaping Use: Never used  Substance Use Topics  . Alcohol use: Yes    Alcohol/week: 7.0 standard drinks    Types: 7 Glasses of wine per week    Comment: a glass a night, some nights more  . Drug use: Not Currently    Types: Marijuana     Comment: last used 1993    Home Medications Prior to Admission medications   Medication Sig Start Date End Date Taking? Authorizing Provider  acetaminophen (TYLENOL) 650 MG CR tablet Take 650 mg by mouth every 4 (four) hours as needed (headache).    [provider]  B Complex-C-Folic Acid (RENAL-VITE) 0.8 MG TABS Take 1 tablet by mouth daily with breakfast.  07/31/18   [provider]  cyclophosphamide (CYTOXAN) 25 MG capsule Take 25 mg by mouth daily with breakfast.  07/22/18   [provider]  dapsone 100 MG tablet Take 100 mg by mouth daily with breakfast.  08/28/18   [provider]  gentamicin cream (GARAMYCIN) 0.1 % Apply 1 application topically See admin instructions. Apply topically to port access daily after cleansing    [provider]  latanoprost (XALATAN) 0.005 % ophthalmic solution Place 1 drop into both eyes at bedtime.    [provider]  levothyroxine (  SYNTHROID) 137 MCG tablet Take 137 mcg by mouth daily before breakfast.    [provider]  OVER THE COUNTER MEDICATION See admin instructions. Pt keeps sugar free candy or gum with her to keep her mouth from getting dry    [provider]  pantoprazole (PROTONIX) 20 MG tablet Take 1 tablet (20 mg total) by mouth daily. 04/30/19 05/30/19  Terrilee Croak, MD  sevelamer carbonate (RENVELA) 800 MG tablet Take 800-2,400 mg by mouth See admin instructions. Take 3 tablets (2400 mg) by mouth three times daily with meals, take 1-2 tablets ([phone]) with snacks    [provider]  sucroferric oxyhydroxide (VELPHORO) 500 MG chewable tablet Chew 1,000 mg by mouth 3 (three) times daily with meals.    [provider]    Allergies    Sulfa antibiotics  Review of Systems   Review of Systems  Constitutional: Negative.   HENT: Negative.   Eyes: Negative for visual disturbance.  Respiratory: Negative.   Cardiovascular: Negative.   Gastrointestinal:  Negative.   Endocrine: Negative.   Genitourinary: Negative.   Musculoskeletal: Negative.   Skin: Negative.   Allergic/Immunologic: Negative.   Neurological: Negative.   Hematological: Negative.   Psychiatric/Behavioral: Negative.   All other systems reviewed and are negative.   Physical Exam Updated Vital Signs BP (!) 221/178   Pulse 66   Temp 97.7 F (36.5 C) (Oral)   Resp 16   SpO2 100%   Physical Exam Vitals and nursing note reviewed.  Constitutional:      General: She is not in acute distress.    Appearance: She is well-developed. She is not ill-appearing.  HENT:     Head: Normocephalic.     Mouth/Throat:     Mouth: Mucous membranes are moist.  Eyes:     Extraocular Movements: Extraocular movements intact.  Cardiovascular:     Rate and Rhythm: Normal rate and regular rhythm.  Pulmonary:     Effort: Pulmonary effort is normal.  Abdominal:     Palpations: Abdomen is soft.  Musculoskeletal:        General: Normal range of motion.     Cervical back: Normal range of motion.  Skin:    General: Skin is warm.  Neurological:     Mental Status: She is alert and oriented to person, place, and time.     Cranial Nerves: No cranial nerve deficit.     Sensory: No sensory deficit.     Motor: No weakness.     Deep Tendon Reflexes: Reflexes normal.  Psychiatric:        Mood and Affect: Mood normal.     ED Results / Procedures / Treatments   Labs (all labs ordered are listed, but only abnormal results are displayed) Labs Reviewed  COMPREHENSIVE METABOLIC PANEL - Abnormal; Notable for the following components:      Result Value   Chloride 96 (*)    Glucose, Bld 100 (*)    BUN 27 (*)    Creatinine, Ser 5.18 (*)    Calcium 8.6 (*)    GFR, Estimated 8 (*)    All other components within normal limits  CBC  DIFFERENTIAL  PROTIME-INR  APTT  CBG MONITORING, ED  I-STAT CHEM 8, ED    EKG EKG Interpretation  Date/Time:  Tuesday October 23 2019 10:02:48  EDT Ventricular Rate:  65 PR Interval:    QRS Duration: 94 QT Interval:  474 QTC Calculation: 493 R Axis:   49 Text Interpretation: Sinus  rhythm Borderline prolonged QT interval Baseline wander in lead(s) V5 Confirmed by Pattricia Boss [phone]) on 10/23/2019 11:11:32 AM   Radiology CT HEAD WO CONTRAST  Result Date: 10/23/2019 CLINICAL DATA:  Headache, nausea, vomiting EXAM: CT HEAD WITHOUT CONTRAST TECHNIQUE: Contiguous axial images were obtained from the base of the skull through the vertex without intravenous contrast. COMPARISON:  None. FINDINGS: Brain: Normal anatomic configuration. No abnormal intra or extra-axial mass lesion or fluid collection. No abnormal mass effect or midline shift. No evidence of acute intracranial hemorrhage or infarct. Ventricular size is normal. Cerebellum unremarkable. Vascular: Unremarkable Skull: Intact Sinuses/Orbits: Paranasal sinuses are clear. Orbits are unremarkable. Other: Mastoid air cells and middle ear cavities are clear. IMPRESSION: Normal examination Electronically Signed   By: Fidela Salisbury MD   On: 10/23/2019 11:03    Procedures Procedures (including critical care time)  Medications Ordered in ED Medications  sodium chloride flush (NS) 0.9 % injection 3 mL (has no administration in time range)    ED Course  I have reviewed the triage vital signs and the nursing notes.  Pertinent labs & imaging results that were available during my care of the patient were reviewed by me and considered in my medical decision making (see chart for details).  Clinical Course as of Oct 23 1303  Tue Oct 23, 2019  1137 Patient given fentanyl and compazine for headache   [DR]  1303 Headache improved- plan home bp med, labetalol, and hydralazine- may d/c if sbp trending down   [DR]    Clinical Course User Index [DR] Pattricia Boss, MD   MDM Rules/Calculators/A&P                          Patient on dialysis presents with headache and  hypertension Headache present for 4 to 5 days patient noted to be normotensive during that time.  I doubt that the headache is related to the blood pressure. Patient has history of aneurysms.  CT obtained and no evidence of bleeding noted.  Patient with normal neurological exam.  Headache has been present for 4 to 5 days at this time and was gradual in onset. Discussed with Mr. Glenford Peers on for neurosurgery- will see and evaluate  Hypertension- appears secondary to pain with known underlying hypertension.  Patient treated here with home meds, labetalol, and hydralazine.  Discussed with nephrology and does not want home meds changed at this time. SBP decreased to 154- then rebounded to 197. Will give clonidine 0.1 mg 2:30 PM Patient with blood pressure decreased to 154/101. Patient may be discharged pending neurosurgery consulted recommendations  Discussed with Dr. [place] Small and dispo after above Final Clinical Impression(s) / ED Diagnoses Final diagnoses:  Secondary hypertension  Acute nonintractable headache, unspecified headache type  Aneurysm, cerebral    Rx / DC Orders ED Discharge Orders    None       Pattricia Boss, MD 10/23/19 1306    Pattricia Boss, MD 10/23/19 1506

## 2019-10-23 NOTE — ED Notes (Signed)
PT actively vomiting at this time.

## 2019-10-23 NOTE — ED Triage Notes (Signed)
Pt comes from Drs office via EMS with right side head pain from back of neck to head x 5 days. Hx of brain aneurysms. No neuro deficits, vision normal. Pt endorsees nausea and vomiting. Dialysis TTS, went today and had full treatment.   220/110 HR 60 CBG 100 spo2 100% RA

## 2019-10-23 NOTE — H&P (Signed)
History and Physical    Lounell Schumacher KDX:[id number] DOB: 1950/08/12 DOA: 10/23/2019  PCP: Lajean Manes, MD Consultants:  Fredna Dow - hand surgery; [place] - GI; nephrology Patient coming from:  Home - lives alone; NOK: Minnie, [place], [phone]  Chief Complaint: headache  HPI: Anays Detore is a 69 y.o. female with medical history significant of hypothyroidism; HTN; HLD; Goodpasture syndrome; cerebral aneurysm; ESRD on TTS HD; and chronic pain presenting with R-sided headache.  For about 4-5 days she has developed a worsening headache.  She was at HD today and her BP was 200 she went to PCP and he sent her here.  She does not check her home BP; HD does tell her that it is high sometimes.  She has not missed BP medication doses.  She does not have a h/o severe headaches.  No severe photophobia.  +N/V.  No recent missed HD or changes to HD.  No diplopia.    ED Course:  H/o multiple cerebral aneurysms, here with worst headache of her life.  Severe HTN.  Head CT negative for bleed.  Neurosurgery evaluated and does not think this is a bleeding aneurysm but recommends close monitoring and aggressive BP management.  Review of Systems: As per HPI; otherwise review of systems reviewed and negative.   Ambulatory Status:  Ambulates without assistance  COVID Vaccine Status:  Complete plus booster  Past Medical History:  Diagnosis Date  . Abdominal pain 06/06/2018  . Acute renal failure (ARF) ([place]) 06/22/2018  . Anemia in chronic kidney disease 08/25/2018  . Anemia, unspecified 07/05/2018  . 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 Anxiety 07/05/2018  . Arthritis   . Bell's palsy 2017   "mild case"  . Cerebral aneurysm 08/2014   pt. states she has 2 aneurysms  . Chronic low back pain 04/12/2017  . Chronic pain syndrome 04/12/2017  . Closed fracture of left distal radius   . De Quervain's tenosynovitis 02/10/2018  . Degeneration of lumbar  intervertebral disc 03/01/2017  . End stage renal failure on dialysis [facility])    M/W/F on [employer]  . GERD (gastroesophageal reflux disease)   . Goodpasture syndrome ([place])   . Headache 10/30/2014  . Hyperlipidemia 04/17/2018  . Hypertension   . Hypothyroidism   . Iron deficiency anemia, unspecified 07/12/2018  . PONV (postoperative nausea and vomiting)    violent vomiting  . Sacral back pain 05/25/2016  . Scoliosis of lumbar spine 03/01/2017  . Secondary hyperparathyroidism of renal origin ([place]) 08/22/2018  . 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 Thyroid disease   . Trigger finger of left thumb 02/10/2018    Past Surgical History:  Procedure Laterality Date  . ARTERY BIOPSY Right 10/31/2014   Procedure: BIOPSY TEMPORAL ARTERY-RIGHT;  Surgeon: Mal Misty, MD;  Location: [place];  Service: Vascular;  Laterality: Right;  . AV FISTULA PLACEMENT Left 10/11/2018   Procedure: left arm ARTERIOVENOUS (AV) FISTULA  creation;  Surgeon: Waynetta Sandy, MD;  Location: [place];  Service: Vascular;  Laterality: Left;  . BILATERAL CARPAL TUNNEL RELEASE    . BIOPSY  04/29/2019   Procedure: BIOPSY;  Surgeon: Otis Brace, MD;  [facility];  Service: Gastroenterology;;  . BREAST SURGERY Left    lumpectomy  . BUNIONECTOMY Right   . CHOLECYSTECTOMY N/A 10/13/2016   Procedure: LAPAROSCOPIC CHOLECYSTECTOMY WITH INTRAOPERATIVE CHOLANGIOGRAM;  Surgeon: Donnie Mesa, MD;  Location: [place];  Service: General;  Laterality: N/A;  . ESOPHAGOGASTRODUODENOSCOPY (EGD) WITH PROPOFOL  N/A 04/29/2019   Procedure: ESOPHAGOGASTRODUODENOSCOPY (EGD) WITH PROPOFOL;  Surgeon: Otis Brace, MD;  Location: [facility];  Service: Gastroenterology;  Laterality: N/A;  . EYE SURGERY     surgery for cross eye  . FOOT FRACTURE SURGERY    . OPEN REDUCTION INTERNAL FIXATION (ORIF) DISTAL RADIAL FRACTURE Left 03/29/2017   Procedure: OPEN REDUCTION INTERNAL FIXATION (ORIF)LEFT  DISTAL RADIAL  FRACTURE;  Surgeon: Leanora Cover, MD;  Location: [place];  Service: Orthopedics;  Laterality: Left;  . THYROIDECTOMY    . TONSILLECTOMY    . WISDOM TOOTH EXTRACTION      Social History   Socioeconomic History  . Marital status: Single    Spouse name: Not on file  . Number of children: Not on file  . Years of education: Not on file  . Highest education level: Not on file  Occupational History  . Not on file  Tobacco Use  . Smoking status: Former Smoker    Packs/day: 0.50    Years: 20.00    Pack years: 10.00    Types: Cigarettes    Quit date: 1993    Years since quitting: 28.8  . Smokeless tobacco: Never Used  Vaping Use  . Vaping Use: Never used  Substance and Sexual Activity  . Alcohol use: Yes    Alcohol/week: 7.0 standard drinks    Types: 7 Glasses of wine per week    Comment: a glass a night, some nights more  . Drug use: Not Currently    Types: Marijuana    Comment: last used 1993  . Sexual activity: Not on file  Other Topics Concern  . Not on file  Social History Narrative  . Not on file   Social Determinants of Health   Financial Resource Strain:   . Difficulty of Paying Living Expenses: Not on file  Food Insecurity:   . Worried About Charity fundraiser in the Last Year: Not on file  . Ran Out of Food in the Last Year: Not on file  Transportation Needs:   . Lack of Transportation (Medical): Not on file  . Lack of Transportation (Non-Medical): Not on file  Physical Activity:   . Days of Exercise per Week: Not on file  . Minutes of Exercise per Session: Not on file  Stress:   . Feeling of Stress : Not on file  Social Connections:   . Frequency of Communication with Friends and Family: Not on file  . Frequency of Social Gatherings with Friends and Family: Not on file  . Attends Religious Services: Not on file  . Active Member of Clubs or Organizations: Not on file  . Attends Archivist Meetings: Not on file  . Marital  Status: Not on file  Intimate Partner Violence:   . Fear of Current or Ex-Partner: Not on file  . Emotionally Abused: Not on file  . Physically Abused: Not on file  . Sexually Abused: Not on file    Allergies  Allergen Reactions  . Sulfa Antibiotics Rash    Family History  Problem Relation Age of Onset  . COPD Mother   . Kidney disease Mother   . Heart disease Mother   . Cancer Father     Prior to Admission medications   Medication Sig Start Date End Date Taking? Authorizing Provider  acetaminophen (TYLENOL) 650 MG CR tablet Take 650 mg by mouth every 4 (four) hours as needed (headache).   Yes [provider]  B Complex-C-Folic  Acid (RENAL-VITE) 0.8 MG TABS Take 1 tablet by mouth daily with breakfast.  07/31/18  Yes [provider]  carvedilol (COREG) 6.25 MG tablet Take 6.25 mg by mouth See admin instructions. Taking 6.25mg  bid on non- Dialysis days, and on Dialysis days only taking 1 tab (6.25mg ) at night. Dialysis on Tues, Thur, Sat. 07/20/18  Yes [provider]  latanoprost (XALATAN) 0.005 % ophthalmic solution Place 1 drop into both eyes at bedtime.   Yes [provider]  levothyroxine (SYNTHROID) 137 MCG tablet Take 137 mcg by mouth daily before breakfast.   Yes [provider]  LORazepam (ATIVAN) 0.5 MG tablet Take 0.5 mg by mouth at bedtime. 09/22/19  Yes [provider]  OVER THE COUNTER MEDICATION See admin instructions. Pt keeps sugar free candy or gum with her to keep her mouth from getting dry   Yes [provider]  pantoprazole (PROTONIX) 20 MG tablet Take 1 tablet (20 mg total) by mouth daily. Patient taking differently: Take 20 mg by mouth daily as needed for heartburn or indigestion.  04/30/19 10/23/19 Yes Dahal, Marlowe Aschoff, MD  sucroferric oxyhydroxide (VELPHORO) 500 MG chewable tablet Chew 1,500 mg by mouth 3 (three) times daily with meals.    Yes [provider]  traMADol (ULTRAM) 50 MG tablet Take  50 mg by mouth every 12 (twelve) hours as needed for moderate pain.  08/31/19  Yes [provider]  traZODone (DESYREL) 50 MG tablet Take 50 mg by mouth at bedtime.   Yes [provider]  cyclophosphamide (CYTOXAN) 25 MG capsule Take 25 mg by mouth daily with breakfast.  07/22/18   [provider]  dapsone 100 MG tablet Take 100 mg by mouth daily with breakfast.  08/28/18   [provider]  gentamicin cream (GARAMYCIN) 0.1 % Apply 1 application topically See admin instructions. Apply topically to port access daily after cleansing    [provider]  meclizine (ANTIVERT) 12.5 MG tablet Take 12.5 mg by mouth 3 (three) times daily as needed for dizziness. 09/27/19   [provider]  sevelamer carbonate (RENVELA) 800 MG tablet Take 800-2,400 mg by mouth See admin instructions. Take 3 tablets (2400 mg) by mouth three times daily with meals, take 1-2 tablets ([phone]) with snacks    [provider]    Physical Exam: Vitals:   10/23/19 1545 10/23/19 1600 10/23/19 1601 10/23/19 1615  BP: (!) 243/109 (!) 180/83  (!) 168/82  Pulse: 91 76 76 76  Resp: (!) 24 16 13 11   Temp:      TempSrc:      SpO2: 99% 99% 99% 98%     . General:  Lying in a dark room, somewhat cantankerous . Eyes:  PERRL, EOMI, normal lids, iris . ENT:  grossly normal hearing, lips & tongue, mmm . Neck:  no LAD, masses or thyromegaly . Cardiovascular:  RRR, no m/r/g. No LE edema.  Marland Kitchen Respiratory:   CTA bilaterally with no wheezes/rales/rhonchi.  Normal respiratory effort. . Abdomen:  soft, mildly diffusely TTP without guarding or rebound, ND, NABS . Skin:  no rash or induration seen on limited exam . Musculoskeletal:  grossly normal tone BUE/BLE, good ROM, no bony abnormality . Psychiatric:  grossly normal mood and affect, speech fluent and appropriate, AOx3 . Neurologic:  CN 2-12 grossly intact, moves all extremities in coordinated fashion    Radiological Exams on  Admission: CT HEAD WO CONTRAST  Result Date: 10/23/2019 CLINICAL DATA:  Headache, nausea, vomiting EXAM: CT  HEAD WITHOUT CONTRAST TECHNIQUE: Contiguous axial images were obtained from the base of the skull through the vertex without intravenous contrast. COMPARISON:  None. FINDINGS: Brain: Normal anatomic configuration. No abnormal intra or extra-axial mass lesion or fluid collection. No abnormal mass effect or midline shift. No evidence of acute intracranial hemorrhage or infarct. Ventricular size is normal. Cerebellum unremarkable. Vascular: Unremarkable Skull: Intact Sinuses/Orbits: Paranasal sinuses are clear. Orbits are unremarkable. Other: Mastoid air cells and middle ear cavities are clear. IMPRESSION: Normal examination Electronically Signed   By: Fidela Salisbury MD   On: 10/23/2019 11:03    EKG: Independently reviewed.  NSR with rate 65; nonspecific ST changes with no evidence of acute ischemia   Labs on Admission: I have personally reviewed the available labs and imaging studies at the time of the admission.  Pertinent labs:   BUN 27/Creatinine 5.18/GFR 8 Normal CBC INR 0.9   Assessment/Plan Principal Problem:   Hypertensive crisis Active Problems:   Chronic pain syndrome   Hyperlipidemia   End stage renal disease (HCC)   History of cerebral aneurysm   Hypertensive crisis -Patient presenting with severe headache and SBP >>200, concerning for hypertensive crisis -She has h/o cerebral aneurysms and rupture is a consideration (see below) -Negative head CT -The patient received Coreg and Clonidine PO and IV Labetalol x 1 and hydralazine x 2 with subsequent significant decrease in BP without apparent difficulty -Per neurosurgery, goal BP is <140 at this time -Will observe in neuroprogressive unit for now -Continue Coreg -Will add prn hydralazine  H/o cerebral aneurysm -MRI on 9/27 with 3 small ICA aneurysms at the skull base -Given known aneurysms, rupture is a distinct  concern -CT negative for acute hemorrhage -Neurosurgery is consulting -Consider angiogram for further evaluation  Chronic pain -I have reviewed this patient in the [place] Controlled Substances Reporting System.  She is receiving medications from two providers but appears to be taking them as prescribed. -She is not at particularly high risk of opioid misuse or diversion but is at increased risk of overdose. -Continue Ultram and Ativan -Add prn hydrocodone and morphine for breakthrough pain  ESRD on HD -Patient on chronic TTS HD -Nephrology prn order set utilized -She was dialyzed today, although she only finished 3 hours of the treatment due to the severity of her headache -Nephrology consulted by the ER since patient may need further HD while hospitalized -Continue Renavite and Velphoro -She is not currently taking Revela and so this was held  HLD -She does not appear to be taking medications for this issue at this time  Hypothyroidism -Normal TSH in 02/2019 -Continue Synthroid at current dose for now    Note: This patient has been tested and is pending  for the novel coronavirus COVID-19. The patient has been fully vaccinated against COVID-19.    DVT prophylaxis:  SCDs Code Status:  Full - confirmed with patient Family Communication: None present Disposition Plan:  The patient is from: home  Anticipated d/c is to: home without [facility] services   Anticipated d/c date will depend on clinical response to treatment, but possibly as early as tomorrow if she has excellent response to treatment  Patient is currently: acutely ill Consults called: Neurosurgery Admission status:  It is my clinical opinion that referral for OBSERVATION is reasonable and necessary in this patient based on the above information provided. The aforementioned taken together are felt to place the patient at high risk for further clinical deterioration. However it is anticipated that the patient may  be medically  stable for discharge from the hospital within 24 to 48 hours.    Karmen Bongo MD [facility]   How to contact the [facility] Attending or Consulting provider [place] or covering provider during after hours [place], for this patient?  1. Check the care team in [facility] and look for a) attending/consulting TR[facility] provider listed and b) the [facility] team listed 2. Log into [email or web address] and use [place]'s universal password to access. If you do not have the password, please contact the hospital operator. 3. Locate the [facility] provider you are looking for under [facility] and page to a number that you can be directly reached. 4. If you still have difficulty reaching the provider, please page the [facility] (Director on Call) for the Hospitalists listed on amion for assistance.   10/23/2019, 5:16 PM

## 2019-10-23 NOTE — ED Provider Notes (Signed)
  Provider Note MRN:  [id number]  Arrival date & time: 10/23/19    ED Course and Medical Decision Making  Assumed care from Dr. Jeanell Sparrow at shift change.  Evaluated by neurosurgery who recommends blood pressure control and medicine admission.  Reassuring neurological exam at this time, currently no indication for further imaging or neurosurgical intervention.  Neurosurgery will continue to follow, will admit to medicine.  .Critical Care Performed by: Maudie Flakes, MD Authorized by: Maudie Flakes, MD   Critical care provider statement:    Critical care time (minutes):  32   Critical care was necessary to treat or prevent imminent or life-threatening deterioration of the following conditions: hypertensive crisis.   Critical care was time spent personally by me on the following activities:  Discussions with consultants, evaluation of patient's response to treatment, examination of patient, ordering and performing treatments and interventions, ordering and review of laboratory studies, ordering and review of radiographic studies, pulse oximetry, re-evaluation of patient's condition, obtaining history from patient or surrogate and review of old charts   I assumed direction of critical care for this patient from another provider in my specialty: yes      Final Clinical Impressions(s) / ED Diagnoses     ICD-10-CM   1. Secondary hypertension  I15.9   2. Acute nonintractable headache, unspecified headache type  R51.9   3. Aneurysm, cerebral  I67.1     ED Discharge Orders    None      Discharge Instructions   None     Barth Kirks. [place]onia Small, [place] [email or web address]    Maudie Flakes, MD 10/23/19 [phone]

## 2019-10-24 ENCOUNTER — Observation Stay (HOSPITAL_COMMUNITY): Payer: Medicare HMO

## 2019-10-24 DIAGNOSIS — I6602 Occlusion and stenosis of left middle cerebral artery: Secondary | ICD-10-CM | POA: Diagnosis not present

## 2019-10-24 DIAGNOSIS — I169 Hypertensive crisis, unspecified: Secondary | ICD-10-CM | POA: Diagnosis not present

## 2019-10-24 DIAGNOSIS — I671 Cerebral aneurysm, nonruptured: Secondary | ICD-10-CM | POA: Diagnosis not present

## 2019-10-24 LAB — CBC
HCT: 36.4 % (ref 36.0–46.0)
Hemoglobin: 11.7 g/dL — ABNORMAL LOW (ref 12.0–15.0)
MCH: 31.2 pg (ref 26.0–34.0)
MCHC: 32.1 g/dL (ref 30.0–36.0)
MCV: 97.1 fL (ref 80.0–100.0)
Platelets: 154 10*3/uL (ref 150–400)
RBC: 3.75 MIL/uL — ABNORMAL LOW (ref 3.87–5.11)
RDW: 14.5 % (ref 11.5–15.5)
WBC: 4.2 10*3/uL (ref 4.0–10.5)
nRBC: 0 % (ref 0.0–0.2)

## 2019-10-24 LAB — BASIC METABOLIC PANEL
Anion gap: 12 (ref 5–15)
BUN: 40 mg/dL — ABNORMAL HIGH (ref 8–23)
CO2: 29 mmol/L (ref 22–32)
Calcium: 8.3 mg/dL — ABNORMAL LOW (ref 8.9–10.3)
Chloride: 99 mmol/L (ref 98–111)
Creatinine, Ser: 7.7 mg/dL — ABNORMAL HIGH (ref 0.44–1.00)
GFR, Estimated: 5 mL/min — ABNORMAL LOW (ref >60)
Glucose, Bld: 116 mg/dL — ABNORMAL HIGH (ref 70–99)
Potassium: 4.2 mmol/L (ref 3.5–5.1)
Sodium: 140 mmol/L (ref 135–145)

## 2019-10-24 NOTE — Progress Notes (Signed)
PROGRESS NOTE    Cristina Wilson  XTK:[id number] DOB: 05-02-1950 DOA: 10/23/2019 PCP: Lajean Manes, MD   Chief Complaint  Patient presents with  . Headache  . Hypertension   Brief Narrative: 69 y.o. female with medical history significant of hypothyroidism; HTN; HLD; Goodpasture syndrome; cerebral aneurysm; ESRD on TTS HD; and chronic pain presenting with R-sided headache.  For about 4-5 days she has developed a worsening headache.  She was at HD - her BP was 200 she went to PCP and he sent her here.  She does not check her home BP; HD does tell her that it is high sometimes.  She has not missed BP medication doses.  She does not have a h/o severe headaches.  No severe photophobia.  +N/V.  No recent missed HD or changes to HD.  No diplopia. ED Course:  H/o multiple cerebral aneurysms, here with worst headache of her life.  Severe HTN.  Head CT negative for bleed.  Neurosurgery evaluated and does not think this is a bleeding aneurysm but recommends close monitoring and aggressive BP management   Subjective: Seen this morning.  Alert awake resting in the bedside chair.  No headache. Blood pressure is stable 130s to 150s.  Overnight episode of low blood pressure. BUN/creatinine 40/7.7  Assessment & Plan:  Headache no ICH 2/2 Hypertensive crisis, SBP more than 200 on presentation.,  Seen by neurosurgery.  Advised hepatus BP controlled goal < 140-monitor closely, neurosurgery input appreciated noted plan to get MRI MRA today and Dr. Kathyrn Sheriff to evaluate patient tomorrow.  Continue to optimize blood pressure continue coreg needed nitroglycerin  History of cerebral aneurysm.  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  Followed by neurosurgery team appreciate input.see above  Chronic pain syndrome: Current global therapy very outpatient prescribers, continue Ultram, Ativan, as needed hydrocodone and  morphine for breakthrough pain.  Hyperlipidemia.  ESRD on HD  TTS metabolic bone disease/hyperlipidemia:.s/p HD 10/19.  Nephro was consulted and reviewed, continue patient's home meds. HD tomorrow.  Hypothyroidism: Cont current synthroid dose.  Normal TSH 02/2019  HLD not on meds.   Nutrition: Diet Order            Diet renal with fluid restriction Fluid restriction: 1200 mL Fluid; Room service appropriate? Yes; Fluid consistency: Thin  Diet effective now                 There is no height or weight on file to calculate BMI.  DVT prophylaxis: SCDs Start: 10/23/19 1706 Code Status:   Code Status: Full Code  Family Communication: plan of care discussed with patient at bedside.  Status is: admitted as Observation Patient remains hospitalized for ongoing management of headache blood pressure adjustment and close follow-up and monitoring by neurosurgery along with further imaging studies and further evaluation again tomorrow morning. Dispo: The patient is from: Home              Anticipated d/c is to: Home              Anticipated d/c date is: 1 day              Patient currently is not medically stable to d/c.   Consultants: Neurosurgery Procedures:see note  Culture/Microbiology No results found for: SDES, SPECREQUEST, CULT, REPTSTATUS  Other culture-see note  Medications: Scheduled Meds: . carvedilol  6.25 mg Oral 2 times per day on Sun Mon Wed  Fri   And  . carvedilol  6.25 mg Oral Once per day on Tue Thu Sat  . docusate sodium  100 mg Oral BID  . latanoprost  1 drop Both Eyes QHS  . levothyroxine  137 mcg Oral Q0600  . LORazepam  0.5 mg Oral QHS  . multivitamin  1 tablet Oral Q breakfast  . sodium chloride flush  3 mL Intravenous Q12H  . sucroferric oxyhydroxide  1,500 mg Oral TID WC  . traZODone  50 mg Oral QHS   Continuous Infusions:  Antimicrobials: Anti-infectives (From admission, onward)   None     Objective: Vitals: Today's Vitals   10/23/19 2106  10/23/19 2330 10/24/19 0415 10/24/19 0758  BP:  (!) 105/58 130/66 (!) 154/82  Pulse: 72 68  63  Resp:  15  14  Temp:  98 F (36.7 C) 98 F (36.7 C) 98 F (36.7 C)  TempSrc:  Oral Oral Oral  SpO2:  98%  100%  PainSc:       No intake or output data in the 24 hours ending 10/24/19 0806 There were no vitals filed for this visit. Weight change:   Intake/Output from previous day: No intake/output data recorded. Intake/Output this shift: No intake/output data recorded.  Examination: General exam: AAO ,NAD, weak appearing. HEENT:Oral mucosa moist, Ear/Nose WNL grossly,dentition normal. Respiratory system: bilaterally clear,no wheezing or crackles,no use of accessory muscle, non tender. Cardiovascular system: S1 & S2 +, regular, No JVD. Gastrointestinal system: Abdomen soft, NT,ND, BS+. Nervous System:Alert, awake, moving extremities and grossly nonfocal Extremities: No edema, distal peripheral pulses palpable.  Skin: No rashes,no icterus. MSK: Normal muscle bulk,tone, power  Data Reviewed: I have personally reviewed following labs and imaging studies CBC: Recent Labs  Lab 10/23/19 1007 10/24/19 0328  WBC 5.1 4.2  NEUTROABS 3.1  --   HGB 13.2 11.7*  HCT 40.3 36.4  MCV 96.6 97.1  PLT 167 294   Basic Metabolic Panel: Recent Labs  Lab 10/23/19 1007 10/24/19 0328  NA 140 140  K 3.5 4.2  CL 96* 99  CO2 30 29  GLUCOSE 100* 116*  BUN 27* 40*  CREATININE 5.18* 7.70*  CALCIUM 8.6* 8.3*   GFR: CrCl cannot be calculated (Unknown ideal weight.). Liver Function Tests: Recent Labs  Lab 10/23/19 1007  AST 25  ALT 28  ALKPHOS 41  BILITOT 1.1  PROT 7.1  ALBUMIN 4.1   No results for input(s): LIPASE, AMYLASE in the last 168 hours. No results for input(s): AMMONIA in the last 168 hours. Coagulation Profile: Recent Labs  Lab 10/23/19 1007  INR 0.9   Cardiac Enzymes: No results for input(s): CKTOTAL, CKMB, CKMBINDEX, TROPONINI in the last 168 hours. BNP (last 3  results) No results for input(s): PROBNP in the last 8760 hours. HbA1C: No results for input(s): HGBA1C in the last 72 hours. CBG: Recent Labs  Lab 10/23/19 1020  GLUCAP 87   Lipid Profile: No results for input(s): CHOL, HDL, LDLCALC, TRIG, CHOLHDL, LDLDIRECT in the last 72 hours. Thyroid Function Tests: No results for input(s): TSH, T4TOTAL, FREET4, T3FREE, THYROIDAB in the last 72 hours. Anemia Panel: No results for input(s): VITAMINB12, FOLATE, FERRITIN, TIBC, IRON, RETICCTPCT in the last 72 hours. Sepsis Labs: No results for input(s): PROCALCITON, LATICACIDVEN in the last 168 hours.  Recent Results (from the past 240 hour(s))  Respiratory Panel by RT PCR (Flu A&B, Covid) - Nasopharyngeal Swab     Status: None   Collection Time: 10/23/19  5:41 PM  Specimen: Nasopharyngeal Swab  Result Value Ref Range Status   SARS Coronavirus 2 by RT PCR NEGATIVE NEGATIVE Final    Comment: (NOTE) SARS-CoV-2 target nucleic acids are NOT DETECTED.  The SARS-CoV-2 RNA is generally detectable in upper respiratoy specimens during the acute phase of infection. The lowest concentration of SARS-CoV-2 viral copies this assay can detect is 131 copies/mL. A negative result does not preclude SARS-Cov-2 infection and should not be used as the sole basis for treatment or other patient management decisions. A negative result may occur with  improper specimen collection/handling, submission of specimen other than nasopharyngeal swab, presence of viral mutation(s) within the areas targeted by this assay, and inadequate number of viral copies (<131 copies/mL). A negative result must be combined with clinical observations, patient history, and epidemiological information. The expected result is Negative.  Fact Sheet for Patients:  PinkCheek.be  Fact Sheet for Healthcare Providers:  GravelBags.it  This test is no t yet approved or cleared by the  [place] FDA and  has been authorized for detection and/or diagnosis of SARS-CoV-2 by FDA under an Emergency Use Authorization (EUA). This EUA will remain  in effect (meaning this test can be used) for the duration of the COVID-19 declaration under Section 564(b)(1) of the Act, 21 U.S.C. section 360bbb-3(b)(1), unless the authorization is terminated or revoked sooner.     Influenza A by PCR NEGATIVE NEGATIVE Final   Influenza B by PCR NEGATIVE NEGATIVE Final    Comment: (NOTE) The Xpert Xpress SARS-CoV-2/FLU/RSV assay is intended as an aid in  the diagnosis of influenza from Nasopharyngeal swab specimens and  should not be used as a sole basis for treatment. Nasal washings and  aspirates are unacceptable for Xpert Xpress SARS-CoV-2/FLU/RSV  testing.  Fact Sheet for Patients: PinkCheek.be  Fact Sheet for Healthcare Providers: GravelBags.it  This test is not yet approved or cleared by the [place] FDA and  has been authorized for detection and/or diagnosis of SARS-CoV-2 by  FDA under an Emergency Use Authorization (EUA). This EUA will remain  in effect (meaning this test can be used) for the duration of the  Covid-19 declaration under Section 564(b)(1) of the Act, 21  U.S.C. section 360bbb-3(b)(1), unless the authorization is  terminated or revoked. Performed at [facility], [place] [address], [place], [place] [address]   MRSA PCR Screening     Status: None   Collection Time: 10/23/19  8:35 PM   Specimen: Nasal Mucosa; Nasopharyngeal  Result Value Ref Range Status   MRSA by PCR NEGATIVE NEGATIVE Final    Comment:        The GeneXpert MRSA Assay (FDA approved for NASAL specimens only), is one component of a comprehensive MRSA colonization surveillance program. It is not intended to diagnose MRSA infection nor to guide or monitor treatment for MRSA infections. Performed at [facility], [place] [address], [place], [place] [address]      Radiology Studies: CT HEAD WO CONTRAST  Result Date: 10/23/2019 CLINICAL DATA:  Headache, nausea, vomiting EXAM: CT HEAD WITHOUT CONTRAST TECHNIQUE: Contiguous axial images were obtained from the base of the skull through the vertex without intravenous contrast. COMPARISON:  None. FINDINGS: Brain: Normal anatomic configuration. No abnormal intra or extra-axial mass lesion or fluid collection. No abnormal mass effect or midline shift. No evidence of acute intracranial hemorrhage or infarct. Ventricular size is normal. Cerebellum unremarkable. Vascular: Unremarkable Skull: Intact Sinuses/Orbits: Paranasal sinuses are clear. Orbits are unremarkable. Other: Mastoid air cells  and middle ear cavities are clear. IMPRESSION: Normal examination Electronically Signed   By: Fidela Salisbury MD   On: 10/23/2019 11:03     LOS: 0 days   Antonieta Pert, MD [facility]  10/24/2019, 8:06 AM

## 2019-10-24 NOTE — Progress Notes (Signed)
Renal Quick Note:  Aware that Cristina Wilson was admitted OBS status with HTN urgency and headache. Per notes, symptoms improved and repeat MRI stable.  Her usual dialysis schedule is TTS at [facility]. Her usual chair time is 5:40am, but the clinic thinks they can accommodate her on their 2nd shift tomorrow (around noon). I will have to double check with them in the morning for a time.   If she can be discharged in the AM to make it to outpatient HD session, that would be ideal.  Of course, if her symptoms worsen or if anything changes, we can certainly dialyze her as inpatient tomorrow.  Veneta Penton, PA-C [employer] Pager [phone]

## 2019-10-24 NOTE — Progress Notes (Addendum)
2000 - Pt arrived on 4NP from ED via bed. Pt A&Ox4. VS stable. Belongings at bedside. Sacral foam placed. CHG bath given. Pt oriented to unit. Call bell in reach, bed alarm set, bed in lowest position.

## 2019-10-24 NOTE — Progress Notes (Signed)
Subjective: Patient reports that she is feeling much better this morning and her headache has been much less severe. RN reports aggressive BP control with intermittent periods of hypotension overnight.   Objective: Vital signs in last 24 hours: Temp:  [97.7 F (36.5 C)-98.2 F (36.8 C)] 98 F (36.7 C) (10/20 0758) Pulse Rate:  [55-91] 63 (10/20 0758) Resp:  [9-24] 14 (10/20 0758) BP: (105-243)/(58-178) 154/82 (10/20 0758) SpO2:  [94 %-100 %] 100 % (10/20 0758)  Intake/Output from previous day: No intake/output data recorded. Intake/Output this shift: No intake/output data recorded.  Physical Exam Constitutional:      Appearance: Normal appearance. She is well-developed and normal weight.  HENT:     Head: Normocephalic and atraumatic.  Eyes:     General: No visual field deficit.    Extraocular Movements: Extraocular movements intact.     Pupils: Pupils are equal, round, and reactive to light.  Musculoskeletal:     Cervical back: Normal range of motion and neck supple.  Neurological:     General: No focal deficit present.     Mental Status: She is alert and oriented to person, place, and time. Mental status is at baseline.     GCS: GCS eye subscore is 4. GCS verbal subscore is 5. GCS motor subscore is 6.     Cranial Nerves: Cranial nerves are intact. No cranial nerve deficit, dysarthria or facial asymmetry.     Sensory: Sensation is intact.     Motor: Motor function is intact.     Coordination: Coordination is intact.  Psychiatric:        Attention and Perception: Attention and perception normal.        Mood and Affect: Mood and affect normal.        Behavior: Behavior normal. Behavior is cooperative.        Thought Content: Thought content normal.        Cognition and Memory: Cognition and memory normal.        Judgment: Judgment normal.   Lab Results: Recent Labs    10/23/19 1007 10/24/19 0328  WBC 5.1 4.2  HGB 13.2 11.7*  HCT 40.3 36.4  PLT 167 154    BMET Recent Labs    10/23/19 1007 10/24/19 0328  NA 140 140  K 3.5 4.2  CL 96* 99  CO2 30 29  GLUCOSE 100* 116*  BUN 27* 40*  CREATININE 5.18* 7.70*  CALCIUM 8.6* 8.3*    Studies/Results: CT HEAD WO CONTRAST  Result Date: 10/23/2019 CLINICAL DATA:  Headache, nausea, vomiting EXAM: CT HEAD WITHOUT CONTRAST TECHNIQUE: Contiguous axial images were obtained from the base of the skull through the vertex without intravenous contrast. COMPARISON:  None. FINDINGS: Brain: Normal anatomic configuration. No abnormal intra or extra-axial mass lesion or fluid collection. No abnormal mass effect or midline shift. No evidence of acute intracranial hemorrhage or infarct. Ventricular size is normal. Cerebellum unremarkable. Vascular: Unremarkable Skull: Intact Sinuses/Orbits: Paranasal sinuses are clear. Orbits are unremarkable. Other: Mastoid air cells and middle ear cavities are clear. IMPRESSION: Normal examination Electronically Signed   By: Fidela Salisbury MD   On: 10/23/2019 11:03    Assessment/Plan: Will obtain MRI/MRA today to ensure there is no evidence of aneurysm rupture/SAH. Continue to suspect that her headache is from uncontrolled HTN and low suspicion for [facility]. Continue to observe patient and treat headache and HTN accordingly. Dr. Kathyrn Sheriff to evaluate patient tomorrow.    LOS: 0 days     Marvis Moeller,  DNP, NP-C 10/24/2019, 8:54 AM

## 2019-10-25 DIAGNOSIS — Z87891 Personal history of nicotine dependence: Secondary | ICD-10-CM | POA: Diagnosis not present

## 2019-10-25 DIAGNOSIS — K219 Gastro-esophageal reflux disease without esophagitis: Secondary | ICD-10-CM | POA: Diagnosis not present

## 2019-10-25 DIAGNOSIS — I120 Hypertensive chronic kidney disease with stage 5 chronic kidney disease or end stage renal disease: Secondary | ICD-10-CM | POA: Diagnosis present

## 2019-10-25 DIAGNOSIS — E785 Hyperlipidemia, unspecified: Secondary | ICD-10-CM | POA: Diagnosis present

## 2019-10-25 DIAGNOSIS — N186 End stage renal disease: Secondary | ICD-10-CM | POA: Diagnosis not present

## 2019-10-25 DIAGNOSIS — Z79890 Hormone replacement therapy (postmenopausal): Secondary | ICD-10-CM | POA: Diagnosis not present

## 2019-10-25 DIAGNOSIS — Z79899 Other long term (current) drug therapy: Secondary | ICD-10-CM | POA: Diagnosis not present

## 2019-10-25 DIAGNOSIS — I671 Cerebral aneurysm, nonruptured: Secondary | ICD-10-CM | POA: Diagnosis not present

## 2019-10-25 DIAGNOSIS — Z992 Dependence on renal dialysis: Secondary | ICD-10-CM | POA: Diagnosis not present

## 2019-10-25 DIAGNOSIS — E8889 Other specified metabolic disorders: Secondary | ICD-10-CM | POA: Diagnosis present

## 2019-10-25 DIAGNOSIS — G894 Chronic pain syndrome: Secondary | ICD-10-CM | POA: Diagnosis not present

## 2019-10-25 DIAGNOSIS — I959 Hypotension, unspecified: Secondary | ICD-10-CM | POA: Diagnosis not present

## 2019-10-25 DIAGNOSIS — R519 Headache, unspecified: Secondary | ICD-10-CM | POA: Diagnosis not present

## 2019-10-25 DIAGNOSIS — D631 Anemia in chronic kidney disease: Secondary | ICD-10-CM | POA: Diagnosis not present

## 2019-10-25 DIAGNOSIS — N250 Renal osteodystrophy: Secondary | ICD-10-CM | POA: Diagnosis not present

## 2019-10-25 DIAGNOSIS — I169 Hypertensive crisis, unspecified: Secondary | ICD-10-CM | POA: Diagnosis not present

## 2019-10-25 DIAGNOSIS — Z20822 Contact with and (suspected) exposure to covid-19: Secondary | ICD-10-CM | POA: Diagnosis not present

## 2019-10-25 DIAGNOSIS — I161 Hypertensive emergency: Secondary | ICD-10-CM | POA: Diagnosis not present

## 2019-10-25 DIAGNOSIS — F419 Anxiety disorder, unspecified: Secondary | ICD-10-CM | POA: Diagnosis present

## 2019-10-25 DIAGNOSIS — E890 Postprocedural hypothyroidism: Secondary | ICD-10-CM | POA: Diagnosis not present

## 2019-10-25 DIAGNOSIS — N2581 Secondary hyperparathyroidism of renal origin: Secondary | ICD-10-CM | POA: Diagnosis not present

## 2019-10-25 LAB — CBC
HCT: 37.4 % (ref 36.0–46.0)
Hemoglobin: 12.4 g/dL (ref 12.0–15.0)
MCH: 31.9 pg (ref 26.0–34.0)
MCHC: 33.2 g/dL (ref 30.0–36.0)
MCV: 96.1 fL (ref 80.0–100.0)
Platelets: 162 10*3/uL (ref 150–400)
RBC: 3.89 MIL/uL (ref 3.87–5.11)
RDW: 14.7 % (ref 11.5–15.5)
WBC: 6.1 10*3/uL (ref 4.0–10.5)
nRBC: 0 % (ref 0.0–0.2)

## 2019-10-25 LAB — RENAL FUNCTION PANEL
Albumin: 3.4 g/dL — ABNORMAL LOW (ref 3.5–5.0)
Anion gap: 17 — ABNORMAL HIGH (ref 5–15)
BUN: 65 mg/dL — ABNORMAL HIGH (ref 8–23)
CO2: 25 mmol/L (ref 22–32)
Calcium: 8.9 mg/dL (ref 8.9–10.3)
Chloride: 94 mmol/L — ABNORMAL LOW (ref 98–111)
Creatinine, Ser: 10.48 mg/dL — ABNORMAL HIGH (ref 0.44–1.00)
GFR, Estimated: 4 mL/min — ABNORMAL LOW (ref >60)
Glucose, Bld: 87 mg/dL (ref 70–99)
Phosphorus: 2.9 mg/dL (ref 2.5–4.6)
Potassium: 4.4 mmol/L (ref 3.5–5.1)
Sodium: 136 mmol/L (ref 135–145)

## 2019-10-25 LAB — GLUCOSE, CAPILLARY: Glucose-Capillary: 110 mg/dL — ABNORMAL HIGH (ref 70–99)

## 2019-10-25 MED ORDER — LIDOCAINE HCL (PF) 1 % IJ SOLN: 5.0000 mL | INTRAMUSCULAR | Status: DC | PRN

## 2019-10-25 MED ORDER — CHLORHEXIDINE GLUCONATE CLOTH 2 % EX PADS
6.0000 | MEDICATED_PAD | Freq: Every day | CUTANEOUS | Status: DC
  Administered 2019-10-25 – 2019-10-26 (×2): 6 via TOPICAL

## 2019-10-25 MED ORDER — LABETALOL HCL 5 MG/ML IV SOLN
10.0000 mg | Freq: Once | INTRAVENOUS | Status: AC
  Administered 2019-10-25: 10 mg via INTRAVENOUS
  Filled 2019-10-25: qty 4

## 2019-10-25 MED ORDER — HEPARIN SODIUM (PORCINE) 1000 UNIT/ML DIALYSIS: 20.0000 [IU]/kg | INTRAMUSCULAR | Status: DC | PRN

## 2019-10-25 MED ORDER — KETOROLAC TROMETHAMINE 15 MG/ML IJ SOLN
15.0000 mg | Freq: Once | INTRAMUSCULAR | Status: AC | PRN
  Administered 2019-10-25: 15 mg via INTRAVENOUS
  Filled 2019-10-25: qty 1

## 2019-10-25 MED ORDER — SODIUM CHLORIDE 0.9 % IV SOLN: 100.0000 mL | INTRAVENOUS | Status: DC | PRN

## 2019-10-25 MED ORDER — PENTAFLUOROPROP-TETRAFLUOROETH EX AERO: 1.0000 "application " | INHALATION_SPRAY | CUTANEOUS | Status: DC | PRN

## 2019-10-25 MED ORDER — LIDOCAINE-PRILOCAINE 2.5-2.5 % EX CREA: 1.0000 "application " | TOPICAL_CREAM | CUTANEOUS | Status: DC | PRN

## 2019-10-25 MED ORDER — HEPARIN SODIUM (PORCINE) 1000 UNIT/ML DIALYSIS: 1000.0000 [IU] | INTRAMUSCULAR | Status: DC | PRN

## 2019-10-25 MED ORDER — ALTEPLASE 2 MG IJ SOLR: 2.0000 mg | Freq: Once | INTRAMUSCULAR | Status: DC | PRN

## 2019-10-25 MED ORDER — AMLODIPINE BESYLATE 5 MG PO TABS
5.0000 mg | ORAL_TABLET | Freq: Every day | ORAL | Status: DC
  Administered 2019-10-25: 5 mg via ORAL
  Filled 2019-10-25: qty 1

## 2019-10-25 NOTE — Consult Note (Signed)
[facility] Renal Consultation Note    Indication for Consultation:  Management of ESRD/hemodialysis, anemia, hypertension/volume, and secondary hyperparathyroidism. PCP:  HPI: Cristina Wilson is a 69 y.o. 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   She was admitted on 10/19 with severe R sided headache. Had started approximately 4 days before, initially posterior, then over to R side. She presented to her PCP on 10/19 (after HD) and was noted to have a SBP > 200 with N/V so she was directed to the ED. There, she was given hydralazine and clonidine to acutely reduce her BP. She underwent head CT which was negative for stroke or hemorrhage. Labs were stable. Neuro was consulted. MRI completed 10/20 was negative for stroke or aneurysmal hemorrhage. Her BP has remained high throughout admit, but improving - now with SBP ~170.  Previously took Coreg + amlodipine, but most recently on Coreg 6.25mg  BID only. She struggles with both hypertension as well as intra-dialytic hyPOtension with frequent cramping, vomiting, and syncopal episodes. her amlodipine was stopped, as well as EDW raised, UF profiling and linear Na added to help this during dialysis, but nothing has really helped her. She reports that dialysis has not been great for her.  She denies ever having dyspnea as related to volume overload. She has no edema. At this time, headache had resolved. No Hx migraines. No N/V/diarrhea. No CP or abdominal pain.  Dialyzes on TTS schedule at [facility] - last full HD was 10/19, as above. She is due for dialysis today for which we were consulted.  Past Medical History:  Diagnosis Date  . Abdominal pain 06/06/2018  . Acute renal failure (ARF) ([place]) 06/22/2018  . Anemia in chronic kidney disease 08/25/2018  . Anemia, unspecified 07/05/2018  . 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 Anxiety 07/05/2018  . Arthritis   . Bell's palsy 2017   "mild case"  . Cerebral aneurysm 08/2014   pt. states she has 2 aneurysms  . Chronic low back pain 04/12/2017  . Chronic pain syndrome 04/12/2017  . Closed fracture of left distal radius   . De Quervain's tenosynovitis 02/10/2018  . Degeneration of lumbar intervertebral disc 03/01/2017  . End stage renal failure on dialysis [facility])    M/W/F on [employer]  . GERD (gastroesophageal reflux disease)   . 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 Headache 10/30/2014  . Hyperlipidemia 04/17/2018  . Hypertension   . Hypothyroidism   . Iron deficiency anemia, unspecified 07/12/2018  . PONV (postoperative nausea and vomiting)    violent vomiting  . Sacral back pain 05/25/2016  . Scoliosis of lumbar spine 03/01/2017  . Secondary hyperparathyroidism of renal origin ([place]) 08/22/2018  . 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 Thyroid disease   . Trigger finger of left thumb 02/10/2018   Past Surgical History:  Procedure Laterality Date  . ARTERY BIOPSY Right 10/31/2014   Procedure: BIOPSY TEMPORAL ARTERY-RIGHT;  Surgeon: Mal Misty, MD;  Location: [facility];  Service: Vascular;  Laterality: Right;  . AV FISTULA PLACEMENT Left 10/11/2018   Procedure: left arm ARTERIOVENOUS (AV) FISTULA  creation;  Surgeon: Waynetta Sandy, MD;  Location: [place];  Service: Vascular;  Laterality: Left;  . BILATERAL CARPAL TUNNEL RELEASE    . BIOPSY  04/29/2019   Procedure: BIOPSY;  Surgeon: Otis Brace, MD;  Location: [place];  Service: Gastroenterology;;  . BREAST SURGERY  Left    lumpectomy  . BUNIONECTOMY Right   . CHOLECYSTECTOMY N/A 10/13/2016   Procedure: LAPAROSCOPIC CHOLECYSTECTOMY WITH INTRAOPERATIVE CHOLANGIOGRAM;  Surgeon: Donnie Mesa, MD;  Location: [place];  Service: General;  Laterality: N/A;  . ESOPHAGOGASTRODUODENOSCOPY (EGD) WITH PROPOFOL N/A 04/29/2019   Procedure: ESOPHAGOGASTRODUODENOSCOPY  (EGD) WITH PROPOFOL;  Surgeon: Otis Brace, MD;  Location: [place];  Service: Gastroenterology;  Laterality: N/A;  . EYE SURGERY     surgery for cross eye  . FOOT FRACTURE SURGERY    . OPEN REDUCTION INTERNAL FIXATION (ORIF) DISTAL RADIAL FRACTURE Left 03/29/2017   Procedure: OPEN REDUCTION INTERNAL FIXATION (ORIF)LEFT  DISTAL RADIAL FRACTURE;  Surgeon: Leanora Cover, MD;  Location: [place];  Service: Orthopedics;  Laterality: Left;  . THYROIDECTOMY    . TONSILLECTOMY    . WISDOM TOOTH EXTRACTION     Family History  Problem Relation Age of Onset  . COPD Mother   . Kidney disease Mother   . Heart disease Mother   . Cancer Father    Social History:  reports that she quit smoking about 28 years ago. Her smoking use included cigarettes. She has a 10.00 pack-year smoking history. She has never used smokeless tobacco. She reports current alcohol use of about 7.0 standard drinks of alcohol per week. She reports previous drug use. Drug: Marijuana.  ROS: As per HPI otherwise negative.  Physical Exam: Vitals:   10/25/19 0515 10/25/19 0753 10/25/19 1036 10/25/19 1146  BP: (!) 152/66 (!) 176/77 (!) 198/74 (!) 175/67  Pulse: 67 65 99 60  Resp: 16 14 11 13   Temp:  98.2 F (36.8 C) 98 F (36.7 C) 97.7 F (36.5 C)  TempSrc:  Oral Oral Oral  SpO2: 93% 97% 97% 100%     General: Well developed, well nourished, in no acute distress. Head: Normocephalic, atraumatic, sclera non-icteric, mucus membranes are moist. Neck: Supple without lymphadenopathy/masses. JVD not elevated. Lungs: Clear bilaterally to auscultation without wheezes, rales, or rhonchi. Breathing is unlabored. Heart: RRR with normal S1, S2. No murmurs, rubs, or gallops appreciated. Abdomen: Soft, non-tender, non-distended with normoactive bowel sounds. No rebound/guarding. Musculoskeletal:  Strength and tone appear normal for age. Lower extremities: No edema or ischemic changes, no open wounds. Neuro:  Alert and oriented X 3. Moves all extremities spontaneously. Psych:  Responds to questions appropriately with a normal affect. Dialysis Access: AVF + thrill  Allergies  Allergen Reactions  . Sulfa Antibiotics Rash   Prior to Admission medications   Medication Sig Start Date End Date Taking? Authorizing Provider  acetaminophen (TYLENOL) 650 MG CR tablet Take 650 mg by mouth every 4 (four) hours as needed (headache).   Yes [provider]  B Complex-C-Folic Acid (RENAL-VITE) 0.8 MG TABS Take 1 tablet by mouth daily with breakfast.  07/31/18  Yes [provider]  carvedilol (COREG) 6.25 MG tablet Take 6.25 mg by mouth See admin instructions. Taking 6.25mg  bid on non- Dialysis days, and on Dialysis days only taking 1 tab (6.25mg ) at night. Dialysis on Tues, Thur, Sat. 07/20/18  Yes [provider]  latanoprost (XALATAN) 0.005 % ophthalmic solution Place 1 drop into both eyes at bedtime.   Yes [provider]  levothyroxine (SYNTHROID) 137 MCG tablet Take 137 mcg by mouth daily before breakfast.   Yes [provider]  LORazepam (ATIVAN) 0.5 MG tablet Take 0.5 mg by mouth at bedtime. 09/22/19  Yes [provider]  OVER THE COUNTER MEDICATION See admin instructions. Pt keeps sugar free  candy or gum with her to keep her mouth from getting dry   Yes [provider]  pantoprazole (PROTONIX) 20 MG tablet Take 1 tablet (20 mg total) by mouth daily. Patient taking differently: Take 20 mg by mouth daily as needed for heartburn or indigestion.  04/30/19 10/23/19 Yes Dahal, Marlowe Aschoff, MD  sucroferric oxyhydroxide (VELPHORO) 500 MG chewable tablet Chew 1,500 mg by mouth 3 (three) times daily with meals.    Yes [provider]  traMADol (ULTRAM) 50 MG tablet Take 50 mg by mouth every 12 (twelve) hours as needed for moderate pain.  08/31/19  Yes [provider]  traZODone (DESYREL) 50 MG tablet Take 50 mg by mouth at bedtime.   Yes [provider]  meclizine (ANTIVERT) 12.5 MG tablet Take 12.5 mg by mouth 3 (three) times daily as needed for dizziness. 09/27/19   [provider]  sevelamer carbonate (RENVELA) 800 MG tablet Take 800-2,400 mg by mouth See admin instructions. Take 3 tablets (2400 mg) by mouth three times daily with meals, take 1-2 tablets ([phone]) with snacks    [provider]   Current Facility-Administered Medications  Medication Dose Route Frequency Provider Last Rate Last Admin  . acetaminophen (TYLENOL) tablet 650 mg  650 mg Oral Q6H PRN Karmen Bongo, MD   650 mg at 10/25/19 0758   Or  . acetaminophen (TYLENOL) suppository 650 mg  650 mg Rectal Q6H PRN Karmen Bongo, MD      . amLODipine (NORVASC) tablet 5 mg  5 mg Oral Daily Kc, Ramesh, MD   5 mg at 10/25/19 0813  . bisacodyl (DULCOLAX) EC tablet 5 mg  5 mg Oral Daily PRN Karmen Bongo, MD      . calcium carbonate (dosed in mg elemental calcium) suspension 500 mg of elemental calcium  500 mg of elemental calcium Oral Q6H PRN Karmen Bongo, MD      . camphor-menthol [facility]) lotion 1 application  1 application Topical W2N PRN Karmen Bongo, MD       And  . hydrOXYzine (ATARAX/VISTARIL) tablet 25 mg  25 mg Oral Q8H PRN Karmen Bongo, MD      . carvedilol (COREG) tablet 6.25 mg  6.25 mg Oral 2 times per day on Sun Mon Wed Fri Yates, Jennifer, MD   6.25 mg at 10/24/19 2119   And  . carvedilol (COREG) tablet 6.25 mg  6.25 mg Oral Once per day on Tue Thu Sat Karmen Bongo, MD   6.25 mg at 10/23/19 2106  . Chlorhexidine Gluconate Cloth 2 % PADS 6 each  6 each Topical Q0600 Loren Racer, PA-C   6 each at 10/25/19 1044  . docusate sodium (COLACE) capsule 100 mg  100 mg Oral BID Karmen Bongo, MD   100 mg at 10/24/19 2114  . docusate sodium (ENEMEEZ) enema 283 mg  1 enema Rectal PRN Karmen Bongo, MD      . feeding supplement (NEPRO CARB STEADY) liquid 237 mL  237 mL Oral TID PRN Karmen Bongo, MD      . hydrALAZINE  (APRESOLINE) injection 5-20 mg  5-20 mg Intravenous Q4H PRN Karmen Bongo, MD   20 mg at 10/25/19 1024  . HYDROcodone-acetaminophen (NORCO/VICODIN) 5-325 MG per tablet 1-2 tablet  1-2 tablet Oral Q4H PRN Karmen Bongo, MD   1 tablet at 10/25/19 0227  . latanoprost (XALATAN) 0.005 % ophthalmic solution 1 drop  1 drop Both Eyes QHS Karmen Bongo, MD   1 drop at 10/23/19 2103  .  levothyroxine (SYNTHROID) tablet 137 mcg  137 mcg Oral Q0600 Karmen Bongo, MD   137 mcg at 10/25/19 0520  . LORazepam (ATIVAN) tablet 0.5 mg  0.5 mg Oral QHS Karmen Bongo, MD   0.5 mg at 10/24/19 2114  . meclizine (ANTIVERT) tablet 12.5 mg  12.5 mg Oral TID PRN Karmen Bongo, MD      . morphine 2 MG/ML injection 2 mg  2 mg Intravenous Q2H PRN Karmen Bongo, MD      . multivitamin (RENA-VIT) tablet 1 tablet  1 tablet Oral Q breakfast Karmen Bongo, MD   1 tablet at 10/25/19 0758  . ondansetron (ZOFRAN) tablet 4 mg  4 mg Oral Q6H PRN Karmen Bongo, MD       Or  . ondansetron [facility]) injection 4 mg  4 mg Intravenous Q6H PRN Karmen Bongo, MD      . polyethylene glycol (MIRALAX / GLYCOLAX) packet 17 g  17 g Oral Daily PRN Karmen Bongo, MD      . sodium chloride flush (NS) 0.9 % injection 3 mL  3 mL Intravenous Q12H Karmen Bongo, MD   3 mL at 10/25/19 1035  . sorbitol 70 % solution 30 mL  30 mL Oral PRN Karmen Bongo, MD      . sucroferric oxyhydroxide [facility]) chewable tablet 1,500 mg  1,500 mg Oral TID WC Karmen Bongo, MD   1,500 mg at 10/25/19 0803  . traMADol (ULTRAM) tablet 50 mg  50 mg Oral Q12H PRN Karmen Bongo, MD      . traZODone (DESYREL) tablet 50 mg  50 mg Oral Ivery Quale, MD   50 mg at 10/24/19 2114  . zolpidem (AMBIEN) tablet 5 mg  5 mg Oral QHS PRN Karmen Bongo, MD   5 mg at 10/25/19 0009   Labs: Basic Metabolic Panel: Recent Labs  Lab 10/23/19 1007 10/24/19 0328  NA 140 140  K 3.5 4.2  CL 96* 99  CO2 30 29  GLUCOSE 100* 116*  BUN 27* 40*  CREATININE  5.18* 7.70*  CALCIUM 8.6* 8.3*   Liver Function Tests: Recent Labs  Lab 10/23/19 1007  AST 25  ALT 28  ALKPHOS 41  BILITOT 1.1  PROT 7.1  ALBUMIN 4.1   CBC: Recent Labs  Lab 10/23/19 1007 10/24/19 0328  WBC 5.1 4.2  NEUTROABS 3.1  --   HGB 13.2 11.7*  HCT 40.3 36.4  MCV 96.6 97.1  PLT 167 154   CBG: Recent Labs  Lab 10/23/19 1020  GLUCAP 87   Studies/Results: MR ANGIO HEAD WO CONTRAST  Result Date: 10/24/2019 CLINICAL DATA:  Headache.  Assess for aneurysm change or rupture. EXAM: MRI HEAD WITHOUT CONTRAST MRA HEAD WITHOUT CONTRAST TECHNIQUE: Multiplanar, multiecho pulse sequences of the brain and surrounding structures were obtained without intravenous contrast. Angiographic images of the head were obtained using MRA technique without contrast. COMPARISON:  Head CT yesterday. MR angiography 10/01/2019 and 09/01/2018. FINDINGS: MRI HEAD FINDINGS Brain: Diffusion imaging does not show any acute or subacute infarction. The brainstem and cerebellum are normal. Cerebral hemispheres are normal without evidence of large or small vessel infarction, mass lesion, hemorrhage, hydrocephalus or extra-axial collection. Vascular: Major vessels at the base of the brain show flow. Skull and upper cervical spine: Negative Sinuses/Orbits: Clear/normal Other: None MRA HEAD FINDINGS No change since previous examinations. Both internal carotid arteries show flow through the skull base and siphon regions. Left paraclinoid aneurysm measuring 4 mm is unchanged. Right paraclinoid aneurysm measuring 3 mm is  unchanged. Right supraclinoid aneurysm measuring 3 mm is unchanged. 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 Both anterior cerebral is receive there supply from the right carotid circulation. No anterior communicating aneurysm. More distal anterior circulation branch vessels show atherosclerotic  irregularity. Right vertebral artery is dominant. Left vertebral artery is a small vessel showing thready flow. Prominent anterior inferior cerebellar arteries on both sides. Mild atherosclerotic irregularity of the basilar without focal stenosis. Both superior cerebellar arteries are patent. Posterior cerebral arteries are patent, the left receiving most of it supply from the anterior circulation. More distal PCA branch vessels show atherosclerotic irregularity. IMPRESSION: 1. Normal MRI of the brain. No MR evidence of intracranial hemorrhage. 2. Intracranial MR angiography is unchanged. 4 mm aneurysm in the left paraclinoid region. 3 mm right paraclinoid aneurysm. 3 mm right supraclinoid aneurysm. Diffuse atherosclerotic irregularity of the more distal intracranial branch vessels. 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 Electronically Signed   By: Nelson Chimes M.D.   On: 10/24/2019 12:45   MR BRAIN WO CONTRAST  Result Date: 10/24/2019 CLINICAL DATA:  Headache.  Assess for aneurysm change or rupture. EXAM: MRI HEAD WITHOUT CONTRAST MRA HEAD WITHOUT CONTRAST TECHNIQUE: Multiplanar, multiecho pulse sequences of the brain and surrounding structures were obtained without intravenous contrast. Angiographic images of the head were obtained using MRA technique without contrast. COMPARISON:  Head CT yesterday. MR angiography 10/01/2019 and 09/01/2018. FINDINGS: MRI HEAD FINDINGS Brain: Diffusion imaging does not show any acute or subacute infarction. The brainstem and cerebellum are normal. Cerebral hemispheres are normal without evidence of large or small vessel infarction, mass lesion, hemorrhage, hydrocephalus or extra-axial collection. Vascular: Major vessels at the base of the brain show flow. Skull and upper cervical spine: Negative Sinuses/Orbits: Clear/normal Other: None MRA HEAD FINDINGS No change since previous examinations. Both internal carotid arteries show flow through the skull base  and siphon regions. Left paraclinoid aneurysm measuring 4 mm is unchanged. Right paraclinoid aneurysm measuring 3 mm is unchanged. Right supraclinoid aneurysm measuring 3 mm is unchanged. 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 Both anterior cerebral is receive there supply from the right carotid circulation. No anterior communicating aneurysm. More distal anterior circulation branch vessels show atherosclerotic irregularity. Right vertebral artery is dominant. Left vertebral artery is a small vessel showing thready flow. Prominent anterior inferior cerebellar arteries on both sides. Mild atherosclerotic irregularity of the basilar without focal stenosis. Both superior cerebellar arteries are patent. Posterior cerebral arteries are patent, the left receiving most of it supply from the anterior circulation. More distal PCA branch vessels show atherosclerotic irregularity. IMPRESSION: 1. Normal MRI of the brain. No MR evidence of intracranial hemorrhage. 2. Intracranial MR angiography is unchanged. 4 mm aneurysm in the left paraclinoid region. 3 mm right paraclinoid aneurysm. 3 mm right supraclinoid aneurysm. Diffuse atherosclerotic irregularity of the more distal intracranial branch vessels. 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 Electronically Signed   By: Nelson Chimes M.D.   On: 10/24/2019 12:45   Dialysis Orders:  TTS at [facility] 4hr, 400/800, EDW 57kg, 2K/2Ca, Linear Na, AVF, heparin 5000 bolus - Venofer 50mg  IV weekly - Last Hgb > 12, Last PTH < 150 - no VDRA or ESA  Assessment/Plan: 1.  Hypertensive Emergency: BP improved from admit, but still high - escalating  medication. She is not volume overloaded - in fact, she actually looks a little volume down. Amlod 5 restarted, would ^ to 10mg  and likely ^ Coreg to 12.5mg  BID as well. 2.  Severe headache: Possibly d/t #1 v.  migraine-type headache 3.  ESRD: Continue HD per TTS schedule - HD today with low UF goal - consider ^ EDW on discharge. Will stop the linear Na modeling - unclear if making HTN worse. 4.  Anemia: Hgb > 11, no ESA needed. 5.  Metabolic bone disease: Ca ok, Phos pending. Continue home binders (Velphoro) 6.  Hypothyroidism: TSH normal in 02/2019, consider repeating.  Veneta Penton, PA-C 10/25/2019, 12:57 PM  [facility]

## 2019-10-25 NOTE — Progress Notes (Signed)
  NEUROSURGERY PROGRESS NOTE   No issues overnight. Pt has mild HA this am, reports resolution of HA with normal blood pressure.  EXAM:  BP (!) 176/77 (BP Location: Right Arm)   Pulse 65   Temp 98.2 F (36.8 C) (Oral)   Resp 14   SpO2 97%   Awake, alert, oriented  Speech fluent, appropriate  CN grossly intact  5/5 BUE/BLE   IMAGING: MRI/MRA reviewed without evidence for [facility]. MRA is stable in comparison to prior MRA a few weeks ago without any increase in size or change in morphology of aneuryms.  IMPRESSION:  69 y.o. female with hypertensive emergency, no evidence for [facility].  PLAN: - Can continue to monitor aneurysms, does not require treatment at this point - Can d/c home once blood pressure is adequately controlled.  I reviewed the above with the patient. All her questions were answered this am.

## 2019-10-25 NOTE — Progress Notes (Signed)
PROGRESS NOTE    Cristina Wilson  GNF:[id number] DOB: [birth year] DOA: 10/23/2019 PCP: Lajean Manes, MD   Chief Complaint  Patient presents with   Headache   Hypertension   Brief Narrative: 69 y.o. female with medical history significant of hypothyroidism; HTN; HLD; Goodpasture syndrome; cerebral aneurysm; ESRD on TTS HD; and chronic pain presenting with R-sided headache.  For about 4-5 days she has developed a worsening headache.  She was at HD - her BP was 200 she went to PCP and he sent her here.  She does not check her home BP; HD does tell her that it is high sometimes.  She has not missed BP medication doses.  She does not have a h/o severe headaches.  No severe photophobia.  +N/V.  No recent missed HD or changes to HD.  No diplopia. ED Course:  H/o multiple cerebral aneurysms, here with worst headache of her life.  Severe HTN.  Head CT negative for bleed.  Neurosurgery evaluated and does not think this is a bleeding aneurysm but recommends close monitoring and aggressive BP management  Patient was admitted, seen by neurosurgery underwent MRI/MRA and no new finding no ICH.  Subjective:  Patient is still symptomatic with headache and neck pain during night, blood pressure running high 170s to 190s.  Nephrology in the room, patient reports her blood pressures runs  high in the beginning later runs low during dialysis and hd fluids needs to be adjusted, initiated amlodipine this morning  Assessment & Plan:  Headache no ICH 2/2 Hypertensive crisis, SBP more than 200 on presentation: Seen by neurosurgery.  s/pMRI/MRA 10/20 normal with unchanged 4 mm aneurysm in the left paraclinoid region and 3 mm right supraclinoid aneurysm.  Will Optimize blood pressure control, continue her Coreg, add amlodipine 5 mg discussed with her nephrologist- patient was on it previously and was weaned off gradually by her nephrologist.  She is on as needed hydralazine, discussed with nephrology -  apprecaite inputs for BP management- her BP fluctuates during dialysis.  Intracranial aneurysms as noted above,stable.Will optimize blood pressure control.  Seen by neurosurgery team here and appreciate input.  Chronic pain syndrome: cont her regimen w/ Ultram, Ativan, as needed hydrocodone and morphine for breakthrough pain.  Hyperlipidemia.not on meds  ESRD on HD  TTS/metabolic bone disease.s/p HD 10/19.  Nephro consulted for dialysis today.   Hypothyroidism: Continue her home synthroid dose.  Normal TSH 02/2019  Nutrition: Diet Order            Diet renal with fluid restriction Fluid restriction: 1200 mL Fluid; Room service appropriate? Yes; Fluid consistency: Thin  Diet effective now                 There is no height or weight on file to calculate BMI.  DVT prophylaxis: SCDs Start: 10/23/19 1706 Code Status:   Code Status: Full Code  Family Communication: plan of care discussed with patient at bedside.  Status is: admitted as Observation Patient remains hospitalized for ongoing management of headache blood pressure adjustment and titration of her medication for optimal control of blood pressure given her intracranial aneurysm.  Dispo: The patient is from: Home              Anticipated d/c is to: Home              Anticipated d/c date is: 1 day              Patient currently is not medically stable  to d/c.   Consultants: Neurosurgery Procedures:see note  Culture/Microbiology No results found for: SDES, SPECREQUEST, CULT, REPTSTATUS  Other culture-see note  Medications: Scheduled Meds:  amLODipine  5 mg Oral Daily   carvedilol  6.25 mg Oral 2 times per day on Sun Mon Wed Fri   And   carvedilol  6.25 mg Oral Once per day on Tue Thu Sat   docusate sodium  100 mg Oral BID   latanoprost  1 drop Both Eyes QHS   levothyroxine  137 mcg Oral Q0600   LORazepam  0.5 mg Oral QHS   multivitamin  1 tablet Oral Q breakfast   sodium chloride flush  3 mL Intravenous  Q12H   sucroferric oxyhydroxide  1,500 mg Oral TID WC   traZODone  50 mg Oral QHS   Continuous Infusions:  Antimicrobials: Anti-infectives (From admission, onward)   None     Objective: Vitals: Today's Vitals   10/25/19 0325 10/25/19 0500 10/25/19 0515 10/25/19 0753  BP: (!) 159/60 (!) 171/83 (!) 152/66 (!) 176/77  Pulse: (!) 58 (!) 59 67 65  Resp: 18 (!) 0 16 14  Temp: 98.7 F (37.1 C)   98.2 F (36.8 C)  TempSrc: Oral   Oral  SpO2: 97% 99% 93% 97%  PainSc:        Intake/Output Summary (Last 24 hours) at 10/25/2019 0809 Last data filed at 10/25/2019 0553 Gross per 24 hour  Intake 493 ml  Output --  Net 493 ml   There were no vitals filed for this visit. Weight change:   Intake/Output from previous day: 10/20 0701 - 10/21 0700 In: 823 [P.O.:823] Out: -  Intake/Output this shift: No intake/output data recorded.  Examination: General exam: AAOx3,NAD, weak appearing. HEENT:Oral mucosa moist, Ear/Nose WNL grossly, dentition normal. Respiratory system: bilaterally clear,no wheezing or crackles,no use of accessory muscle. Cardiovascular system: S1 & S2 +, No JVD. Gastrointestinal system: Abdomen soft, NT,ND, BS+. Nervous System:Alert, awake, moving extremities and grossly non-focal. Extremities: No edema, distal peripheral pulses palpable.  Skin: No rashes,no icterus. MSK: Normal muscle bulk,tone, power  Data Reviewed: I have personally reviewed following labs and imaging studies CBC: Recent Labs  Lab 10/23/19 1007 10/24/19 0328  WBC 5.1 4.2  NEUTROABS 3.1  --   HGB 13.2 11.7*  HCT 40.3 36.4  MCV 96.6 97.1  PLT 167 161   Basic Metabolic Panel: Recent Labs  Lab 10/23/19 1007 10/24/19 0328  NA 140 140  K 3.5 4.2  CL 96* 99  CO2 30 29  GLUCOSE 100* 116*  BUN 27* 40*  CREATININE 5.18* 7.70*  CALCIUM 8.6* 8.3*   GFR: CrCl cannot be calculated (Unknown ideal weight.). Liver Function Tests: Recent Labs  Lab 10/23/19 1007  AST 25  ALT 28   ALKPHOS 41  BILITOT 1.1  PROT 7.1  ALBUMIN 4.1   No results for input(s): LIPASE, AMYLASE in the last 168 hours. No results for input(s): AMMONIA in the last 168 hours. Coagulation Profile: Recent Labs  Lab 10/23/19 1007  INR 0.9   Cardiac Enzymes: No results for input(s): CKTOTAL, CKMB, CKMBINDEX, TROPONINI in the last 168 hours. BNP (last 3 results) No results for input(s): PROBNP in the last 8760 hours. HbA1C: No results for input(s): HGBA1C in the last 72 hours. CBG: Recent Labs  Lab 10/23/19 1020  GLUCAP 87   Lipid Profile: No results for input(s): CHOL, HDL, LDLCALC, TRIG, CHOLHDL, LDLDIRECT in the last 72 hours. Thyroid Function Tests: No results for input(s): TSH,  T4TOTAL, FREET4, T3FREE, THYROIDAB in the last 72 hours. Anemia Panel: No results for input(s): VITAMINB12, FOLATE, FERRITIN, TIBC, IRON, RETICCTPCT in the last 72 hours. Sepsis Labs: No results for input(s): PROCALCITON, LATICACIDVEN in the last 168 hours.  Recent Results (from the past 240 hour(s))  Respiratory Panel by RT PCR (Flu A&B, Covid) - Nasopharyngeal Swab     Status: None   Collection Time: 10/23/19  5:41 PM   Specimen: Nasopharyngeal Swab  Result Value Ref Range Status   SARS Coronavirus 2 by RT PCR NEGATIVE NEGATIVE Final    Comment: (NOTE) SARS-CoV-2 target nucleic acids are NOT DETECTED.  The SARS-CoV-2 RNA is generally detectable in upper respiratoy specimens during the acute phase of infection. The lowest concentration of SARS-CoV-2 viral copies this assay can detect is 131 copies/mL. A negative result does not preclude SARS-Cov-2 infection and should not be used as the sole basis for treatment or other patient management decisions. A negative result may occur with  improper specimen collection/handling, submission of specimen other than nasopharyngeal swab, presence of viral mutation(s) within the areas targeted by this assay, and inadequate number of viral copies (<131  copies/mL). A negative result must be combined with clinical observations, patient history, and epidemiological information. The expected result is Negative.  Fact Sheet for Patients:  PinkCheek.be  Fact Sheet for Healthcare Providers:  GravelBags.it  This test is no t yet approved or cleared by the [place] FDA and  has been authorized for detection and/or diagnosis of SARS-CoV-2 by FDA under an Emergency Use Authorization (EUA). This EUA will remain  in effect (meaning this test can be used) for the duration of the COVID-19 declaration under Section 564(b)(1) of the Act, 21 U.S.C. section 360bbb-3(b)(1), unless the authorization is terminated or revoked sooner.     Influenza A by PCR NEGATIVE NEGATIVE Final   Influenza B by PCR NEGATIVE NEGATIVE Final    Comment: (NOTE) The Xpert Xpress SARS-CoV-2/FLU/RSV assay is intended as an aid in  the diagnosis of influenza from Nasopharyngeal swab specimens and  should not be used as a sole basis for treatment. Nasal washings and  aspirates are unacceptable for Xpert Xpress SARS-CoV-2/FLU/RSV  testing.  Fact Sheet for Patients: PinkCheek.be  Fact Sheet for Healthcare Providers: GravelBags.it  This test is not yet approved or cleared by the [place] FDA and  has been authorized for detection and/or diagnosis of SARS-CoV-2 by  FDA under an Emergency Use Authorization (EUA). This EUA will remain  in effect (meaning this test can be used) for the duration of the  Covid-19 declaration under Section 564(b)(1) of the Act, 21  U.S.C. section 360bbb-3(b)(1), unless the authorization is  terminated or revoked. Performed at [facility], [place] [address] [place], [place] [address]   MRSA PCR Screening     Status: None   Collection Time: 10/23/19  8:35 PM   Specimen: Nasal Mucosa; Nasopharyngeal  Result Value Ref  Range Status   MRSA by PCR NEGATIVE NEGATIVE Final    Comment:        The GeneXpert MRSA Assay (FDA approved for NASAL specimens only), is one component of a comprehensive MRSA colonization surveillance program. It is not intended to diagnose MRSA infection nor to guide or monitor treatment for MRSA infections. Performed at [facility], [place] [address], [place],  [address]      Radiology Studies: CT HEAD WO CONTRAST  Result Date: 10/23/2019 CLINICAL DATA:  Headache, nausea, vomiting EXAM: CT HEAD  WITHOUT CONTRAST TECHNIQUE: Contiguous axial images were obtained from the base of the skull through the vertex without intravenous contrast. COMPARISON:  None. FINDINGS: Brain: Normal anatomic configuration. No abnormal intra or extra-axial mass lesion or fluid collection. No abnormal mass effect or midline shift. No evidence of acute intracranial hemorrhage or infarct. Ventricular size is normal. Cerebellum unremarkable. Vascular: Unremarkable Skull: Intact Sinuses/Orbits: Paranasal sinuses are clear. Orbits are unremarkable. Other: Mastoid air cells and middle ear cavities are clear. IMPRESSION: Normal examination Electronically Signed   By: Fidela Salisbury MD   On: 10/23/2019 11:03   MR ANGIO HEAD WO CONTRAST  Result Date: 10/24/2019 CLINICAL DATA:  Headache.  Assess for aneurysm change or rupture. EXAM: MRI HEAD WITHOUT CONTRAST MRA HEAD WITHOUT CONTRAST TECHNIQUE: Multiplanar, multiecho pulse sequences of the brain and surrounding structures were obtained without intravenous contrast. Angiographic images of the head were obtained using MRA technique without contrast. COMPARISON:  Head CT yesterday. MR angiography 10/01/2019 and 09/01/2018. FINDINGS: MRI HEAD FINDINGS Brain: Diffusion imaging does not show any acute or subacute infarction. The brainstem and cerebellum are normal. Cerebral hemispheres are normal without evidence of large or small vessel infarction, mass lesion,  hemorrhage, hydrocephalus or extra-axial collection. Vascular: Major vessels at the base of the brain show flow. Skull and upper cervical spine: Negative Sinuses/Orbits: Clear/normal Other: None MRA HEAD FINDINGS No change since previous examinations. Both internal carotid arteries show flow through the skull base and siphon regions. Left paraclinoid aneurysm measuring 4 mm is unchanged. Right paraclinoid aneurysm measuring 3 mm is unchanged. Right supraclinoid aneurysm measuring 3 mm is unchanged. 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 Both anterior cerebral is receive there supply from the right carotid circulation. No anterior communicating aneurysm. More distal anterior circulation branch vessels show atherosclerotic irregularity. Right vertebral artery is dominant. Left vertebral artery is a small vessel showing thready flow. Prominent anterior inferior cerebellar arteries on both sides. Mild atherosclerotic irregularity of the basilar without focal stenosis. Both superior cerebellar arteries are patent. Posterior cerebral arteries are patent, the left receiving most of it supply from the anterior circulation. More distal PCA branch vessels show atherosclerotic irregularity. IMPRESSION: 1. Normal MRI of the brain. No MR evidence of intracranial hemorrhage. 2. Intracranial MR angiography is unchanged. 4 mm aneurysm in the left paraclinoid region. 3 mm right paraclinoid aneurysm. 3 mm right supraclinoid aneurysm. Diffuse atherosclerotic irregularity of the more distal intracranial branch vessels. 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 Electronically Signed   By: Nelson Chimes M.D.   On: 10/24/2019 12:45   MR BRAIN WO CONTRAST  Result Date: 10/24/2019 CLINICAL DATA:  Headache.  Assess for aneurysm change or rupture. EXAM: MRI HEAD WITHOUT CONTRAST MRA HEAD WITHOUT CONTRAST  TECHNIQUE: Multiplanar, multiecho pulse sequences of the brain and surrounding structures were obtained without intravenous contrast. Angiographic images of the head were obtained using MRA technique without contrast. COMPARISON:  Head CT yesterday. MR angiography 10/01/2019 and 09/01/2018. FINDINGS: MRI HEAD FINDINGS Brain: Diffusion imaging does not show any acute or subacute infarction. The brainstem and cerebellum are normal. Cerebral hemispheres are normal without evidence of large or small vessel infarction, mass lesion, hemorrhage, hydrocephalus or extra-axial collection. Vascular: Major vessels at the base of the brain show flow. Skull and upper cervical spine: Negative Sinuses/Orbits: Clear/normal Other: None MRA HEAD FINDINGS No change since previous examinations. Both internal carotid arteries show flow through  the skull base and siphon regions. Left paraclinoid aneurysm measuring 4 mm is unchanged. Right paraclinoid aneurysm measuring 3 mm is unchanged. Right supraclinoid aneurysm measuring 3 mm is unchanged. 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 Both anterior cerebral is receive there supply from the right carotid circulation. No anterior communicating aneurysm. More distal anterior circulation branch vessels show atherosclerotic irregularity. Right vertebral artery is dominant. Left vertebral artery is a small vessel showing thready flow. Prominent anterior inferior cerebellar arteries on both sides. Mild atherosclerotic irregularity of the basilar without focal stenosis. Both superior cerebellar arteries are patent. Posterior cerebral arteries are patent, the left receiving most of it supply from the anterior circulation. More distal PCA branch vessels show atherosclerotic irregularity. IMPRESSION: 1. Normal MRI of the brain. No MR evidence of intracranial hemorrhage. 2. Intracranial MR  angiography is unchanged. 4 mm aneurysm in the left paraclinoid region. 3 mm right paraclinoid aneurysm. 3 mm right supraclinoid aneurysm. Diffuse atherosclerotic irregularity of the more distal intracranial branch vessels. 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 Electronically Signed   By: Nelson Chimes M.D.   On: 10/24/2019 12:45     LOS: 0 days   Antonieta Pert, MD [facility]  10/25/2019, 8:09 AM

## 2019-10-25 NOTE — Progress Notes (Signed)
0209 - Pt manual BP 198/80 despite PRN medications. On call provider notified. 10 mg of Labetalol ordered and administered.

## 2019-10-25 NOTE — Progress Notes (Signed)
2139 - BP 193/80 10 mg of hydralazine given per order. BP now 180/72. Pt complaining of severe headache. On call provider notified.10 mg of Labetalol and 15 mg of Toradol ordered and administered. BP down to 123/57.   0555 - BP 204/79. Pt complaining of severe headache. 20 mg of hydralazine given per order.

## 2019-10-26 DIAGNOSIS — I169 Hypertensive crisis, unspecified: Secondary | ICD-10-CM | POA: Diagnosis not present

## 2019-10-26 MED ORDER — AMLODIPINE BESYLATE 10 MG PO TABS
10.0000 mg | ORAL_TABLET | Freq: Every day | ORAL | Status: DC
  Administered 2019-10-26: 10 mg via ORAL
  Filled 2019-10-26: qty 1

## 2019-10-26 MED ORDER — BUTALBITAL-APAP-CAFFEINE 50-325-40 MG PO TABS
2.0000 | ORAL_TABLET | Freq: Once | ORAL | Status: DC
  Filled 2019-10-26: qty 2

## 2019-10-26 MED ORDER — CARVEDILOL 12.5 MG PO TABS
12.5000 mg | ORAL_TABLET | ORAL | Status: DC
  Administered 2019-10-26: 12.5 mg via ORAL

## 2019-10-26 MED ORDER — LABETALOL HCL 5 MG/ML IV SOLN
10.0000 mg | INTRAVENOUS | Status: DC | PRN
  Filled 2019-10-26: qty 4

## 2019-10-26 MED ORDER — CHLORHEXIDINE GLUCONATE CLOTH 2 % EX PADS
6.0000 | MEDICATED_PAD | Freq: Every day | CUTANEOUS | Status: DC
  Administered 2019-10-27: 6 via TOPICAL

## 2019-10-26 MED ORDER — PROMETHAZINE HCL 25 MG/ML IJ SOLN
6.2500 mg | Freq: Four times a day (QID) | INTRAMUSCULAR | Status: DC | PRN
  Administered 2019-10-26: 6.25 mg via INTRAVENOUS
  Filled 2019-10-26 (×2): qty 1

## 2019-10-26 MED ORDER — CARVEDILOL 12.5 MG PO TABS
12.5000 mg | ORAL_TABLET | ORAL | Status: DC
  Filled 2019-10-26: qty 1

## 2019-10-26 MED ORDER — KETOROLAC TROMETHAMINE 15 MG/ML IJ SOLN: 15.0000 mg | Freq: Four times a day (QID) | INTRAMUSCULAR | Status: DC | PRN

## 2019-10-26 MED ORDER — CLONIDINE HCL 0.1 MG PO TABS
0.1000 mg | ORAL_TABLET | Freq: Three times a day (TID) | ORAL | Status: DC
  Administered 2019-10-26 (×2): 0.1 mg via ORAL
  Filled 2019-10-26 (×3): qty 1

## 2019-10-26 MED ORDER — AMLODIPINE BESYLATE 5 MG PO TABS
5.0000 mg | ORAL_TABLET | Freq: Two times a day (BID) | ORAL | Status: DC
  Administered 2019-10-26 – 2019-10-27 (×2): 5 mg via ORAL
  Filled 2019-10-26 (×2): qty 1

## 2019-10-26 MED ORDER — HYDROCODONE-ACETAMINOPHEN 5-325 MG PO TABS
1.0000 | ORAL_TABLET | Freq: Four times a day (QID) | ORAL | Status: DC
  Administered 2019-10-26 – 2019-10-27 (×3): 1 via ORAL
  Filled 2019-10-26 (×3): qty 1

## 2019-10-26 NOTE — Progress Notes (Signed)
[facility] Progress Note   Dialysis Orders: TTS at [facility] 4hr, 400/800, EDW 57kg, 2K/2Ca, Linear Na, AVF, heparin 5000 bolus - Venofer 50mg  IV weekly - Last Hgb > 12, Last PTH < 150 - no VDRA or ESA  Assessment/Plan: 1. Hypertensive Emergency: BP improved from admit, but still high - escalating medication. She is not volume overloaded - Persistent ^ BP and HA overnight - meds uptitrated no amlodipoine 5 bid (would switch to 10 q HS at d/c), coreg 12.5 bid, clonidine 0.1 tid, prn IV labetolol short term toradol for refractory headache. Net UF 500 Thursday with post wt 56.5  2. Severe headache: Possibly d/t #1 v. migraine-type headache 3. ESRD: Continue HD per TTS schedule - HD today with low UF goal -+/- edw 0.5 at outpatient dialysis - Will stop the linear Na modeling - unclear if making HTN worse. 4. Anemia: Hgb > 11, no ESA needed. 5. Metabolic bone disease: Ca ok, Phos 2.9. Continue home binders (Velphoro) 6. Hypothyroidism: TSH normal in 02/2019 7. Anxiety - currently quite upset - not sure how much this is contributing to Marland Kitchen BP   Myriam Jacobson, PA-C [place] 10/26/2019,11:43 AM  LOS: 1 day   Subjective:  Slept well last night.  Intermittently tearful. Wants to go home. "She'll do anything to go home."  Brother and his wife an Therapist, sports are coming from [place]. Lives alone. Claims no headache now without receiving Toradol.    Objective Vitals:   10/26/19 0555 10/26/19 0630 10/26/19 0739 10/26/19 1025  BP: (!) 204/79 (!) 150/61 (!) 170/68 (!) 175/68  Pulse:  68 73 70  Resp:  (!) 8 13   Temp:   98 F (36.7 C)   TempSrc:   Oral   SpO2:  94% 95%   Weight:       Physical Exam General: tearful, anxious,  Heart: RRR Lungs: no rales Abdomen: soft NT Extremities: no edema Dialysis Access: AVF + bruit   Additional Objective Labs: Basic Metabolic Panel: Recent Labs  Lab 10/23/19 1007 10/24/19 0328 10/25/19 1507  NA 140 140  136  K 3.5 4.2 4.4  CL 96* 99 94*  CO2 30 29 25   GLUCOSE 100* 116* 87  BUN 27* 40* 65*  CREATININE 5.18* 7.70* 10.48*  CALCIUM 8.6* 8.3* 8.9  PHOS  --   --  2.9   Liver Function Tests: Recent Labs  Lab 10/23/19 1007 10/25/19 1507  AST 25  --   ALT 28  --   ALKPHOS 41  --   BILITOT 1.1  --   PROT 7.1  --   ALBUMIN 4.1 3.4*   No results for input(s): LIPASE, AMYLASE in the last 168 hours. CBC: Recent Labs  Lab 10/23/19 1007 10/24/19 0328 10/25/19 1507  WBC 5.1 4.2 6.1  NEUTROABS 3.1  --   --   HGB 13.2 11.7* 12.4  HCT 40.3 36.4 37.4  MCV 96.6 97.1 96.1  PLT 167 154 162   Blood Culture No results found for: SDES, SPECREQUEST, CULT, REPTSTATUS  Cardiac Enzymes: No results for input(s): CKTOTAL, CKMB, CKMBINDEX, TROPONINI in the last 168 hours. CBG: Recent Labs  Lab 10/23/19 1020 10/25/19 2215  GLUCAP 87 110*   Iron Studies: No results for input(s): IRON, TIBC, TRANSFERRIN, FERRITIN in the last 72 hours. Lab Results  Component Value Date   INR 0.9 10/23/2019   Studies/Results: MR ANGIO HEAD WO CONTRAST  Result Date: 10/24/2019 CLINICAL DATA:  Headache.  Assess  for aneurysm change or rupture. EXAM: MRI HEAD WITHOUT CONTRAST MRA HEAD WITHOUT CONTRAST TECHNIQUE: Multiplanar, multiecho pulse sequences of the brain and surrounding structures were obtained without intravenous contrast. Angiographic images of the head were obtained using MRA technique without contrast. COMPARISON:  Head CT yesterday. MR angiography 10/01/2019 and 09/01/2018. FINDINGS: MRI HEAD FINDINGS Brain: Diffusion imaging does not show any acute or subacute infarction. The brainstem and cerebellum are normal. Cerebral hemispheres are normal without evidence of large or small vessel infarction, mass lesion, hemorrhage, hydrocephalus or extra-axial collection. Vascular: Major vessels at the base of the brain show flow. Skull and upper cervical spine: Negative Sinuses/Orbits: Clear/normal Other: None  MRA HEAD FINDINGS No change since previous examinations. Both internal carotid arteries show flow through the skull base and siphon regions. Left paraclinoid aneurysm measuring 4 mm is unchanged. Right paraclinoid aneurysm measuring 3 mm is unchanged. Right supraclinoid aneurysm measuring 3 mm is unchanged. 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 Both anterior cerebral is receive there supply from the right carotid circulation. No anterior communicating aneurysm. More distal anterior circulation branch vessels show atherosclerotic irregularity. Right vertebral artery is dominant. Left vertebral artery is a small vessel showing thready flow. Prominent anterior inferior cerebellar arteries on both sides. Mild atherosclerotic irregularity of the basilar without focal stenosis. Both superior cerebellar arteries are patent. Posterior cerebral arteries are patent, the left receiving most of it supply from the anterior circulation. More distal PCA branch vessels show atherosclerotic irregularity. IMPRESSION: 1. Normal MRI of the brain. No MR evidence of intracranial hemorrhage. 2. Intracranial MR angiography is unchanged. 4 mm aneurysm in the left paraclinoid region. 3 mm right paraclinoid aneurysm. 3 mm right supraclinoid aneurysm. Diffuse atherosclerotic irregularity of the more distal intracranial branch vessels. 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 Electronically Signed   By: Nelson Chimes M.D.   On: 10/24/2019 12:45   MR BRAIN WO CONTRAST  Result Date: 10/24/2019 CLINICAL DATA:  Headache.  Assess for aneurysm change or rupture. EXAM: MRI HEAD WITHOUT CONTRAST MRA HEAD WITHOUT CONTRAST TECHNIQUE: Multiplanar, multiecho pulse sequences of the brain and surrounding structures were obtained without intravenous contrast. Angiographic images of the head were obtained using MRA technique  without contrast. COMPARISON:  Head CT yesterday. MR angiography 10/01/2019 and 09/01/2018. FINDINGS: MRI HEAD FINDINGS Brain: Diffusion imaging does not show any acute or subacute infarction. The brainstem and cerebellum are normal. Cerebral hemispheres are normal without evidence of large or small vessel infarction, mass lesion, hemorrhage, hydrocephalus or extra-axial collection. Vascular: Major vessels at the base of the brain show flow. Skull and upper cervical spine: Negative Sinuses/Orbits: Clear/normal Other: None MRA HEAD FINDINGS No change since previous examinations. Both internal carotid arteries show flow through the skull base and siphon regions. Left paraclinoid aneurysm measuring 4 mm is unchanged. Right paraclinoid aneurysm measuring 3 mm is unchanged. Right supraclinoid aneurysm measuring 3 mm is unchanged. 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 Both anterior cerebral is receive there supply from the right carotid circulation. No anterior communicating aneurysm. More distal anterior circulation branch vessels show atherosclerotic irregularity. Right vertebral artery is dominant. Left vertebral artery is a small vessel showing thready flow. Prominent anterior inferior cerebellar arteries on both sides. Mild atherosclerotic irregularity of the basilar without focal stenosis. Both superior cerebellar arteries are patent.  Posterior cerebral arteries are patent, the left receiving most of it supply from the anterior circulation. More distal PCA branch vessels show atherosclerotic irregularity. IMPRESSION: 1. Normal MRI of the brain. No MR evidence of intracranial hemorrhage. 2. Intracranial MR angiography is unchanged. 4 mm aneurysm in the left paraclinoid region. 3 mm right paraclinoid aneurysm. 3 mm right supraclinoid aneurysm. Diffuse atherosclerotic irregularity of the more distal  intracranial branch vessels. 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 Electronically Signed   By: Nelson Chimes M.D.   On: 10/24/2019 12:45   Medications:  . amLODipine  5 mg Oral BID  . butalbital-acetaminophen-caffeine  2 tablet Oral Once  . carvedilol  12.5 mg Oral 2 times per day on Sun Mon Wed Fri   And  . [START ON 10/27/2019] carvedilol  12.5 mg Oral Once per day on Tue Thu Sat  . Chlorhexidine Gluconate Cloth  6 each Topical Q0600  . cloNIDine  0.1 mg Oral TID  . docusate sodium  100 mg Oral BID  . HYDROcodone-acetaminophen  1 tablet Oral Q6H  . latanoprost  1 drop Both Eyes QHS  . levothyroxine  137 mcg Oral Q0600  . LORazepam  0.5 mg Oral QHS  . multivitamin  1 tablet Oral Q breakfast  . sodium chloride flush  3 mL Intravenous Q12H  . sucroferric oxyhydroxide  1,500 mg Oral TID WC  . traZODone  50 mg Oral QHS

## 2019-10-26 NOTE — Progress Notes (Signed)
PROGRESS NOTE    Cristina Wilson  WUX:[id number] DOB: [birth year] DOA: 10/23/2019 PCP: Lajean Manes, MD   Chief Complaint  Patient presents with  . Headache  . Hypertension   Brief Narrative: 69 y.o. female with medical history significant of hypothyroidism; HTN; HLD; Goodpasture syndrome; cerebral aneurysm; ESRD on TTS HD; and chronic pain presenting with R-sided headache.  For about 4-5 days she has developed a worsening headache.  She was at HD - her BP was 200 she went to PCP and he sent her here.  She does not check her home BP; HD does tell her that it is high sometimes.  She has not missed BP medication doses.  She does not have a h/o severe headaches.  No severe photophobia.  +N/V.  No recent missed HD or changes to HD.  No diplopia. ED Course:  H/o multiple cerebral aneurysms, here with worst headache of her life.  Severe HTN.  Head CT negative for bleed.  Neurosurgery evaluated and does not think this is a bleeding aneurysm but recommends close monitoring and aggressive BP management  Patient was admitted, seen by neurosurgery underwent MRI/MRA and no new finding no ICH.  Subjective: Dialysis yesterday 10/21, blood pressure has been uncontrolled despite multiple IV medications throughout the night, she was started on amlodipine yesterday. Still having headache thsi am- got dose upped to 10 amlod, and got coreg- bp better and headache improved. She has been tearful that she was not getting pain medication on time and her meal last night and requesting to go home where she can manage herself.  Assessment & Plan:  Headache no ICH, 2/2 Hypertensive crisis, SBP more than 200 on presentation: Seen by neurosurgery.  s/pMRI/MRA 10/20 normal with unchanged 4 mm aneurysm in the left paraclinoid region and 3 mm right supraclinoid aneurysm. Amlodipine added 10/21, increased to 10 mg thsi am- on Coreg 6.25. Discussed with nephrology appreciate input that planning to keep on amlodipine 5  mg twice daily Coreg 12.5 mg twice daily and clonidine 0.1 mg 3 times daily. Continue as needed hydralazine/labetalol, continue morphine/Toradol/hydrocodone for pain control.  Patient requesting pain medication be scheduled so that she has something to take right away in case of pain. . Intracranial aneurysms as noted above,stable.Will optimize blood pressure control-has been getting IV as needed medication also increasing her home regimen.  Discussed with nephrology this morning who are on board and appreciate their input. Seen by neurosurgery team here and appreciate input.  Chronic pain syndrome: cont her regimen w/ Ultram, Ativan, as needed hydrocodone and morphine for breakthrough pain.  Hyperlipidemia.not on meds  ESRD on HD  TTS/metabolic bone disease.s/p HD 10/19.  Nephro consulted for dialysis today.   Hypothyroidism: Continue her home synthroid dose.  Normal TSH 02/2019  Nutrition: Diet Order            Diet renal with fluid restriction Fluid restriction: 1200 mL Fluid; Room service appropriate? Yes; Fluid consistency: Thin  Diet effective now                 Body mass index is 21.38 kg/m.  DVT prophylaxis: SCDs Start: 10/23/19 1706 Code Status:   Code Status: Full Code  Family Communication: plan of care discussed with patient at bedside.  Status is: admitted as Observation Patient remains hospitalized for ongoing management of headache blood pressure adjustment and titration of her medication for optimal control of blood pressure given her intracranial aneurysm.  Dispo: The patient is from: Home  Anticipated d/c is to: Home              Anticipated d/c date is: 1 day once bp improves and okay w/ nephro.              Patient currently is not medically stable to d/c.   Consultants: Neurosurgery Procedures:see note  Culture/Microbiology No results found for: SDES, SPECREQUEST, CULT, REPTSTATUS  Other culture-see note  Medications: Scheduled Meds: .  amLODipine  10 mg Oral Daily  . carvedilol  6.25 mg Oral 2 times per day on Sun Mon Wed Fri   And  . carvedilol  6.25 mg Oral Once per day on Tue Thu Sat  . Chlorhexidine Gluconate Cloth  6 each Topical Q0600  . docusate sodium  100 mg Oral BID  . latanoprost  1 drop Both Eyes QHS  . levothyroxine  137 mcg Oral Q0600  . LORazepam  0.5 mg Oral QHS  . multivitamin  1 tablet Oral Q breakfast  . sodium chloride flush  3 mL Intravenous Q12H  . sucroferric oxyhydroxide  1,500 mg Oral TID WC  . traZODone  50 mg Oral QHS   Continuous Infusions:  Antimicrobials: Anti-infectives (From admission, onward)   None     Objective: Vitals: Today's Vitals   10/26/19 0050 10/26/19 0555 10/26/19 0630 10/26/19 0739  BP: (!) 132/51 (!) 204/79 (!) 150/61 (!) 170/68  Pulse: 62  68 73  Resp: 16  (!) 8 13  Temp:    98 F (36.7 C)  TempSrc:    Oral  SpO2: 97%  94% 95%  Weight:      PainSc:        Intake/Output Summary (Last 24 hours) at 10/26/2019 0753 Last data filed at 10/25/2019 1831 Gross per 24 hour  Intake 250 ml  Output 500 ml  Net -250 ml   Filed Weights   10/25/19 1416 10/25/19 1831  Weight: 57 kg 56.5 kg   Weight change:   Intake/Output from previous day: 10/21 0701 - 10/22 0700 In: 250 [P.O.:250] Out: 500  Intake/Output this shift: No intake/output data recorded.  Examination: General exam: AAO,tearful in pain, NAD, weak appearing. HEENT:Oral mucosa moist, Ear/Nose WNL grossly, dentition normal. Respiratory system: bilaterally clear,no wheezing or crackles,no use of accessory muscle Cardiovascular system: S1 & S2 +, No JVD,. Gastrointestinal system: Abdomen soft, NT,ND, BS+ Nervous System:Alert, awake, moving extremities and grossly nonfocal Extremities: No edema, distal peripheral pulses palpable.  Skin: No rashes,no icterus. MSK: Normal muscle bulk,tone, power  Data Reviewed: I have personally reviewed following labs and imaging studies CBC: Recent Labs  Lab  10/23/19 1007 10/24/19 0328 10/25/19 1507  WBC 5.1 4.2 6.1  NEUTROABS 3.1  --   --   HGB 13.2 11.7* 12.4  HCT 40.3 36.4 37.4  MCV 96.6 97.1 96.1  PLT 167 154 262   Basic Metabolic Panel: Recent Labs  Lab 10/23/19 1007 10/24/19 0328 10/25/19 1507  NA 140 140 136  K 3.5 4.2 4.4  CL 96* 99 94*  CO2 30 29 25   GLUCOSE 100* 116* 87  BUN 27* 40* 65*  CREATININE 5.18* 7.70* 10.48*  CALCIUM 8.6* 8.3* 8.9  PHOS  --   --  2.9   GFR: CrCl cannot be calculated (Unknown ideal weight.). Liver Function Tests: Recent Labs  Lab 10/23/19 1007 10/25/19 1507  AST 25  --   ALT 28  --   ALKPHOS 41  --   BILITOT 1.1  --   PROT 7.1  --  ALBUMIN 4.1 3.4*   No results for input(s): LIPASE, AMYLASE in the last 168 hours. No results for input(s): AMMONIA in the last 168 hours. Coagulation Profile: Recent Labs  Lab 10/23/19 1007  INR 0.9   Cardiac Enzymes: No results for input(s): CKTOTAL, CKMB, CKMBINDEX, TROPONINI in the last 168 hours. BNP (last 3 results) No results for input(s): PROBNP in the last 8760 hours. HbA1C: No results for input(s): HGBA1C in the last 72 hours. CBG: Recent Labs  Lab 10/23/19 1020 10/25/19 2215  GLUCAP 87 110*   Lipid Profile: No results for input(s): CHOL, HDL, LDLCALC, TRIG, CHOLHDL, LDLDIRECT in the last 72 hours. Thyroid Function Tests: No results for input(s): TSH, T4TOTAL, FREET4, T3FREE, THYROIDAB in the last 72 hours. Anemia Panel: No results for input(s): VITAMINB12, FOLATE, FERRITIN, TIBC, IRON, RETICCTPCT in the last 72 hours. Sepsis Labs: No results for input(s): PROCALCITON, LATICACIDVEN in the last 168 hours.  Recent Results (from the past 240 hour(s))  Respiratory Panel by RT PCR (Flu A&B, Covid) - Nasopharyngeal Swab     Status: None   Collection Time: 10/23/19  5:41 PM   Specimen: Nasopharyngeal Swab  Result Value Ref Range Status   SARS Coronavirus 2 by RT PCR NEGATIVE NEGATIVE Final    Comment: (NOTE) SARS-CoV-2 target  nucleic acids are NOT DETECTED.  The SARS-CoV-2 RNA is generally detectable in upper respiratoy specimens during the acute phase of infection. The lowest concentration of SARS-CoV-2 viral copies this assay can detect is 131 copies/mL. A negative result does not preclude SARS-Cov-2 infection and should not be used as the sole basis for treatment or other patient management decisions. A negative result may occur with  improper specimen collection/handling, submission of specimen other than nasopharyngeal swab, presence of viral mutation(s) within the areas targeted by this assay, and inadequate number of viral copies (<131 copies/mL). A negative result must be combined with clinical observations, patient history, and epidemiological information. The expected result is Negative.  Fact Sheet for Patients:  PinkCheek.be  Fact Sheet for Healthcare Providers:  GravelBags.it  This test is no t yet approved or cleared by the [place] FDA and  has been authorized for detection and/or diagnosis of SARS-CoV-2 by FDA under an Emergency Use Authorization (EUA). This EUA will remain  in effect (meaning this test can be used) for the duration of the COVID-19 declaration under Section 564(b)(1) of the Act, 21 U.S.C. section 360bbb-3(b)(1), unless the authorization is terminated or revoked sooner.     Influenza A by PCR NEGATIVE NEGATIVE Final   Influenza B by PCR NEGATIVE NEGATIVE Final    Comment: (NOTE) The Xpert Xpress SARS-CoV-2/FLU/RSV assay is intended as an aid in  the diagnosis of influenza from Nasopharyngeal swab specimens and  should not be used as a sole basis for treatment. Nasal washings and  aspirates are unacceptable for Xpert Xpress SARS-CoV-2/FLU/RSV  testing.  Fact Sheet for Patients: PinkCheek.be  Fact Sheet for Healthcare  Providers: GravelBags.it  This test is not yet approved or cleared by the [place] FDA and  has been authorized for detection and/or diagnosis of SARS-CoV-2 by  FDA under an Emergency Use Authorization (EUA). This EUA will remain  in effect (meaning this test can be used) for the duration of the  Covid-19 declaration under Section 564(b)(1) of the Act, 21  U.S.C. section 360bbb-3(b)(1), unless the authorization is  terminated or revoked. Performed at [facility], [place] [address] [place], [place] [address]   MRSA PCR Screening  Status: None   Collection Time: 10/23/19  8:35 PM   Specimen: Nasal Mucosa; Nasopharyngeal  Result Value Ref Range Status   MRSA by PCR NEGATIVE NEGATIVE Final    Comment:        The GeneXpert MRSA Assay (FDA approved for NASAL specimens only), is one component of a comprehensive MRSA colonization surveillance program. It is not intended to diagnose MRSA infection nor to guide or monitor treatment for MRSA infections. Performed at [facility], [place] [address] [place], [place] [address]      Radiology Studies: MR ANGIO HEAD WO CONTRAST  Result Date: 10/24/2019 CLINICAL DATA:  Headache.  Assess for aneurysm change or rupture. EXAM: MRI HEAD WITHOUT CONTRAST MRA HEAD WITHOUT CONTRAST TECHNIQUE: Multiplanar, multiecho pulse sequences of the brain and surrounding structures were obtained without intravenous contrast. Angiographic images of the head were obtained using MRA technique without contrast. COMPARISON:  Head CT yesterday. MR angiography 10/01/2019 and 09/01/2018. FINDINGS: MRI HEAD FINDINGS Brain: Diffusion imaging does not show any acute or subacute infarction. The brainstem and cerebellum are normal. Cerebral hemispheres are normal without evidence of large or small vessel infarction, mass lesion, hemorrhage, hydrocephalus or extra-axial collection. Vascular: Major vessels at the base of the brain show  flow. Skull and upper cervical spine: Negative Sinuses/Orbits: Clear/normal Other: None MRA HEAD FINDINGS No change since previous examinations. Both internal carotid arteries show flow through the skull base and siphon regions. Left paraclinoid aneurysm measuring 4 mm is unchanged. Right paraclinoid aneurysm measuring 3 mm is unchanged. Right supraclinoid aneurysm measuring 3 mm is unchanged. 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 Both anterior cerebral is receive there supply from the right carotid circulation. No anterior communicating aneurysm. More distal anterior circulation branch vessels show atherosclerotic irregularity. Right vertebral artery is dominant. Left vertebral artery is a small vessel showing thready flow. Prominent anterior inferior cerebellar arteries on both sides. Mild atherosclerotic irregularity of the basilar without focal stenosis. Both superior cerebellar arteries are patent. Posterior cerebral arteries are patent, the left receiving most of it supply from the anterior circulation. More distal PCA branch vessels show atherosclerotic irregularity. IMPRESSION: 1. Normal MRI of the brain. No MR evidence of intracranial hemorrhage. 2. Intracranial MR angiography is unchanged. 4 mm aneurysm in the left paraclinoid region. 3 mm right paraclinoid aneurysm. 3 mm right supraclinoid aneurysm. Diffuse atherosclerotic irregularity of the more distal intracranial branch vessels. 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 Electronically Signed   By: Nelson Chimes M.D.   On: 10/24/2019 12:45   MR BRAIN WO CONTRAST  Result Date: 10/24/2019 CLINICAL DATA:  Headache.  Assess for aneurysm change or rupture. EXAM: MRI HEAD WITHOUT CONTRAST MRA HEAD WITHOUT CONTRAST TECHNIQUE: Multiplanar, multiecho pulse sequences of the brain and surrounding structures were obtained without  intravenous contrast. Angiographic images of the head were obtained using MRA technique without contrast. COMPARISON:  Head CT yesterday. MR angiography 10/01/2019 and 09/01/2018. FINDINGS: MRI HEAD FINDINGS Brain: Diffusion imaging does not show any acute or subacute infarction. The brainstem and cerebellum are normal. Cerebral hemispheres are normal without evidence of large or small vessel infarction, mass lesion, hemorrhage, hydrocephalus or extra-axial collection. Vascular: Major vessels at the base of the brain show flow. Skull and upper cervical spine: Negative Sinuses/Orbits: Clear/normal Other: None MRA HEAD FINDINGS No change since previous examinations. Both internal carotid arteries show flow through the skull  base and siphon regions. Left paraclinoid aneurysm measuring 4 mm is unchanged. Right paraclinoid aneurysm measuring 3 mm is unchanged. Right supraclinoid aneurysm measuring 3 mm is unchanged. 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 Both anterior cerebral is receive there supply from the right carotid circulation. No anterior communicating aneurysm. More distal anterior circulation branch vessels show atherosclerotic irregularity. Right vertebral artery is dominant. Left vertebral artery is a small vessel showing thready flow. Prominent anterior inferior cerebellar arteries on both sides. Mild atherosclerotic irregularity of the basilar without focal stenosis. Both superior cerebellar arteries are patent. Posterior cerebral arteries are patent, the left receiving most of it supply from the anterior circulation. More distal PCA branch vessels show atherosclerotic irregularity. IMPRESSION: 1. Normal MRI of the brain. No MR evidence of intracranial hemorrhage. 2. Intracranial MR angiography is unchanged. 4 mm aneurysm in the left paraclinoid region. 3 mm right paraclinoid aneurysm. 3 mm right  supraclinoid aneurysm. Diffuse atherosclerotic irregularity of the more distal intracranial branch vessels. 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 Electronically Signed   By: Nelson Chimes M.D.   On: 10/24/2019 12:45     LOS: 1 day   Antonieta Pert, MD [facility]  10/26/2019, 7:53 AM

## 2019-10-26 NOTE — Progress Notes (Signed)
Pt sat up in bed to order breakfast and became dizzy and vomited. Very small amount of light brown emesis noted in basin.

## 2019-10-27 DIAGNOSIS — I169 Hypertensive crisis, unspecified: Secondary | ICD-10-CM | POA: Diagnosis not present

## 2019-10-27 LAB — RENAL FUNCTION PANEL
Albumin: 2.9 g/dL — ABNORMAL LOW (ref 3.5–5.0)
Anion gap: 9 (ref 5–15)
BUN: 44 mg/dL — ABNORMAL HIGH (ref 8–23)
CO2: 29 mmol/L (ref 22–32)
Calcium: 8.2 mg/dL — ABNORMAL LOW (ref 8.9–10.3)
Chloride: 96 mmol/L — ABNORMAL LOW (ref 98–111)
Creatinine, Ser: 8.24 mg/dL — ABNORMAL HIGH (ref 0.44–1.00)
GFR, Estimated: 5 mL/min — ABNORMAL LOW (ref >60)
Glucose, Bld: 85 mg/dL (ref 70–99)
Phosphorus: 4 mg/dL (ref 2.5–4.6)
Potassium: 5.4 mmol/L — ABNORMAL HIGH (ref 3.5–5.1)
Sodium: 134 mmol/L — ABNORMAL LOW (ref 135–145)

## 2019-10-27 LAB — CBC
HCT: 33.4 % — ABNORMAL LOW (ref 36.0–46.0)
Hemoglobin: 10.7 g/dL — ABNORMAL LOW (ref 12.0–15.0)
MCH: 31.6 pg (ref 26.0–34.0)
MCHC: 32 g/dL (ref 30.0–36.0)
MCV: 98.5 fL (ref 80.0–100.0)
Platelets: 133 10*3/uL — ABNORMAL LOW (ref 150–400)
RBC: 3.39 MIL/uL — ABNORMAL LOW (ref 3.87–5.11)
RDW: 14.4 % (ref 11.5–15.5)
WBC: 5.2 10*3/uL (ref 4.0–10.5)
nRBC: 0 % (ref 0.0–0.2)

## 2019-10-27 MED ORDER — HEPARIN SODIUM (PORCINE) 1000 UNIT/ML DIALYSIS: 20.0000 [IU]/kg | INTRAMUSCULAR | Status: DC | PRN

## 2019-10-27 MED ORDER — SODIUM CHLORIDE 0.9 % IV SOLN: 100.0000 mL | INTRAVENOUS | Status: DC | PRN

## 2019-10-27 MED ORDER — CLONIDINE HCL 0.1 MG PO TABS
0.1000 mg | ORAL_TABLET | Freq: Three times a day (TID) | ORAL | Status: DC
  Administered 2019-10-27: 0.1 mg via ORAL
  Filled 2019-10-27: qty 1

## 2019-10-27 MED ORDER — HEPARIN SODIUM (PORCINE) 1000 UNIT/ML DIALYSIS: 1000.0000 [IU] | INTRAMUSCULAR | Status: DC | PRN

## 2019-10-27 MED ORDER — LIDOCAINE-PRILOCAINE 2.5-2.5 % EX CREA: 1.0000 "application " | TOPICAL_CREAM | CUTANEOUS | Status: DC | PRN

## 2019-10-27 MED ORDER — ALTEPLASE 2 MG IJ SOLR: 2.0000 mg | Freq: Once | INTRAMUSCULAR | Status: DC | PRN

## 2019-10-27 MED ORDER — CLONIDINE HCL 0.1 MG PO TABS: 0.1000 mg | ORAL_TABLET | Freq: Three times a day (TID) | ORAL | 1 refills | Status: AC

## 2019-10-27 MED ORDER — CARVEDILOL 12.5 MG PO TABS: 12.5000 mg | ORAL_TABLET | ORAL | 0 refills | Status: AC

## 2019-10-27 MED ORDER — LIDOCAINE HCL (PF) 1 % IJ SOLN: 5.0000 mL | INTRAMUSCULAR | Status: DC | PRN

## 2019-10-27 MED ORDER — CLONIDINE HCL 0.1 MG PO TABS: 0.1000 mg | ORAL_TABLET | Freq: Two times a day (BID) | ORAL | Status: DC

## 2019-10-27 MED ORDER — AMLODIPINE BESYLATE 5 MG PO TABS: 5.0000 mg | ORAL_TABLET | Freq: Two times a day (BID) | ORAL | 0 refills | Status: AC

## 2019-10-27 MED ORDER — PENTAFLUOROPROP-TETRAFLUOROETH EX AERO: 1.0000 "application " | INHALATION_SPRAY | CUTANEOUS | Status: DC | PRN

## 2019-10-27 NOTE — Progress Notes (Addendum)
[facility] Progress Note   Dialysis Orders: TTS at [facility] 4hr, 400/800, EDW 57kg, 2K/2Ca, Linear Na, AVF, heparin 5000 bolus - Venofer 50mg  IV weekly - Last Hgb > 12, Last PTH < 150 - no VDRA or ESA  Assessment/Plan: 1. Hypertensive Emergency: BP improved significantly improved with the addition of amlodipine and clondine Non orthostatic today - keeping even. Consider changing clonidine to prn with parameters - Patient is good about checking BPs. OK for dc from renal standpoint.  2. Severe headache: resolved 3. ESRD: Continue HD per TTS schedule -keep even today - at EDW  4. Anemia: Hgb > 10.7 , no ESA needed yet. Continue to trend 5. Metabolic bone disease: Ca ok, Phos 2.9. Continue home binders (Velphoro) 6. Hypothyroidism: TSH normal in 02/2019 7. Anxiety -much better today  Myriam Jacobson, PA-C [place] [phone] 10/27/2019,7:49 AM  LOS: 2 days   Pt seen, examined and agree w assess/plan as above with additions as indicated.  [facility] 10/27/2019, 1:29 PM     Subjective:  Apologized for her behavior yesterday. Seen on HD. BP much better in goal  Pre HD wt 56.6 standing BPs pre HD 138/64. Clarified pre admission BP meds - taking bid coreg 6.25 and only 6.25 at HS on HD days.  Clonidine 0.1 tid and amlodipine 5 bid added.  Previously had been on amlodipine but nephrologist at HD had taken off by her report  Objective Vitals:   10/26/19 1318 10/26/19 1950 10/26/19 2322 10/27/19 0339  BP: 125/71 (!) 104/46 120/67 138/64  Pulse: 66 68 62 60  Resp: 12 16 18 15   Temp:  98.2 F (36.8 C) 98.3 F (36.8 C) 98.2 F (36.8 C)  TempSrc:  Oral Oral Oral  SpO2: 96% 97% 100%   Weight:       Physical Exam General: calm, smiling engaged Heart: RRR Lungs: no rales Abdomen: soft NT Extremities: no edema Dialysis Access: left AVF cannulated- HD being initiated   Additional Objective Labs: Basic Metabolic  Panel: Recent Labs  Lab 10/24/19 0328 10/25/19 1507 10/27/19 0450  NA 140 136 134*  K 4.2 4.4 5.4*  CL 99 94* 96*  CO2 29 25 29   GLUCOSE 116* 87 85  BUN 40* 65* 44*  CREATININE 7.70* 10.48* 8.24*  CALCIUM 8.3* 8.9 8.2*  PHOS  --  2.9 4.0   Liver Function Tests: Recent Labs  Lab 10/23/19 1007 10/25/19 1507 10/27/19 0450  AST 25  --   --   ALT 28  --   --   ALKPHOS 41  --   --   BILITOT 1.1  --   --   PROT 7.1  --   --   ALBUMIN 4.1 3.4* 2.9*   No results for input(s): LIPASE, AMYLASE in the last 168 hours. CBC: Recent Labs  Lab 10/23/19 1007 10/23/19 1007 10/24/19 0328 10/25/19 1507 10/27/19 0450  WBC 5.1   < > 4.2 6.1 5.2  NEUTROABS 3.1  --   --   --   --   HGB 13.2   < > 11.7* 12.4 10.7*  HCT 40.3   < > 36.4 37.4 33.4*  MCV 96.6  --  97.1 96.1 98.5  PLT 167   < > 154 162 133*   < > = values in this interval not displayed.   Blood Culture No results found for: SDES, SPECREQUEST, CULT, REPTSTATUS  Cardiac Enzymes: No results for input(s): CKTOTAL, CKMB, CKMBINDEX, TROPONINI  in the last 168 hours. CBG: Recent Labs  Lab 10/23/19 1020 10/25/19 2215  GLUCAP 87 110*   Iron Studies: No results for input(s): IRON, TIBC, TRANSFERRIN, FERRITIN in the last 72 hours. Lab Results  Component Value Date   INR 0.9 10/23/2019   Studies/Results: No results found. Medications: . sodium chloride    . sodium chloride     . amLODipine  5 mg Oral BID  . butalbital-acetaminophen-caffeine  2 tablet Oral Once  . carvedilol  12.5 mg Oral 2 times per day on Sun Mon Wed Fri   And  . carvedilol  12.5 mg Oral Once per day on Tue Thu Sat  . Chlorhexidine Gluconate Cloth  6 each Topical Q0600  . cloNIDine  0.1 mg Oral TID  . docusate sodium  100 mg Oral BID  . HYDROcodone-acetaminophen  1 tablet Oral Q6H  . latanoprost  1 drop Both Eyes QHS  . levothyroxine  137 mcg Oral Q0600  . LORazepam  0.5 mg Oral QHS  . multivitamin  1 tablet Oral Q breakfast  . sodium chloride  flush  3 mL Intravenous Q12H  . sucroferric oxyhydroxide  1,500 mg Oral TID WC  . traZODone  50 mg Oral QHS

## 2019-10-27 NOTE — Discharge Summary (Signed)
Physician Discharge Summary  Laiklynn Raczynski ZOX:[id number] DOB: Apr 14, 1950 DOA: 10/23/2019  PCP: Lajean Manes, MD  Admit date: 10/23/2019 Discharge date: 10/27/2019  Admitted From: home Disposition:  home  Recommendations for Outpatient Follow-up:  1. Follow up with PCP in 1-2 weeks 2. Please obtain BMP/CBC in one week 3. Please follow up on the following pending results:  Home Health:No  Equipment/Devices: non  Discharge Condition: Stable Code Status:   Code Status: Full Code Diet recommendation:  Diet Order            Diet - low sodium heart healthy           Diet renal with fluid restriction Fluid restriction: 1200 mL Fluid; Room service appropriate? Yes; Fluid consistency: Thin  Diet effective now                 Brief/Interim Summary: 69 y.o.femalewith medical history significant ofhypothyroidism; HTN; HLD; Goodpasture syndrome; cerebral aneurysm; ESRD onTTSHD; and chronic pain presenting with R-sided headache. For about 4-5 days she has developed a worsening headache. She was at HD - her BP was 200 she went to PCP and he sent her here. She does not check her home BP; HD does tell her that it is high sometimes. She has not missed BP medication doses. She does not have a h/o severe headaches. No severe photophobia. +N/V. No recent missed HD or changes to HD. No diplopia. ED Course:H/o multiple cerebral aneurysms, here with worst headache of her life. Severe HTN. Head CT negative for bleed. Neurosurgery evaluated and does not think this is a bleeding aneurysm but recommends close monitoring and aggressive BP management  Patient was admitted, seen by neurosurgery underwent MRI/MRA and no new finding no ICH She had uncontrolled hypertension her medication regimen was adjusted and blood pressure has subsequently stabilized with goal less than 811 systolic.  No more headache. She was requesting to go home yesterday but finally convinced to stay to  optimize her blood pressure regimen She is stable for discharge home today. He BP did go up afte HD as clonidine was cut down to BID amd then was put back on tid, discussed with Dr Jonnie Finner- BP stabilized in 130s and patient is tearful and adamant on going home today, I did offer her one more day to monitor BP but she is adamant on going hoem today- rerpots she brother and his wife who is a nurse at home and she will monitored closely. Nephro okay with discharge home.  Discharge Diagnoses:  Headache no ICH, 2/2 Hypertensive crisis/hypertensive emergency:SBP more than 200 on presentation: Blood pressure has improved after adjusting recent regimen-with amlodipine 5 mg twice daily, clonidine 0.1 twice daily along with Coreg.  Patient to monitor blood pressure at least 2-3 times a day at home and personally discussed and instructed.  Discussed with nephrology and okay to discharge on current regimen.  Intracranial aneurysms s/pMRI/MRA 10/20 unchanged 4 mm aneurysm in the left paraclinoid region and 3 mm right supraclinoid aneurysm.  Continue blood pressure adjustment target less than 914 systolic.  Chronic pain syndrome: cont her regimen w/ Ultram, Ativan, as needed hydrocodone and morphine for breakthrough pain.  Hyperlipidemia.not on meds  ESRD on HD  TTS/metabolic bone disease.s/p HD 10/19, s/p HD this dialysis adjusted insulin continue with low potassium diet, and scheduled dialysis.  Hypothyroidism: Continue her home synthroid dose.  Normal TSH 02/2019.  Consults:  Nephrology, neurosurgery  Subjective: Alert awake oriented no more headache resting well.  Blood pressure has stabilized  on new regimen.  Completing her dialysis this morning.  Agrees for discharge home today.  Discharge Exam: Vitals:   10/27/19 1448 10/27/19 1516  BP: (!) 180/79 (!) 132/52  Pulse:  73  Resp:  (!) 23  Temp:    SpO2:  100%   General: Pt is alert, awake, not in acute distress Cardiovascular: RRR, S1/S2 +,  no rubs, no gallops Respiratory: CTA bilaterally, no wheezing, no rhonchi Abdominal: Soft, NT, ND, bowel sounds + Extremities: no edema, no cyanosis  Discharge Instructions  Discharge Instructions    Diet - low sodium heart healthy   Complete by: As directed    Discharge instructions   Complete by: As directed    Please call call MD or return to ER for similar or worsening recurring problem that brought you to hospital or if any fever,nausea/vomiting,abdominal pain, uncontrolled pain, chest pain,  shortness of breath or any other alarming symptoms.  Check your blood pressure 2-3 times a day, before taking her blood pressure medication.  Do not take your blood pressure medication if it is low less than 195 systolic and notify your MD.  Please follow-up your doctor as instructed in a week time and call the office for appointment.  Please avoid alcohol, smoking, or any other illicit substance and maintain healthy habits including taking your regular medications as prescribed.  You were cared for by a hospitalist during your hospital stay. If you have any questions about your discharge medications or the care you received while you were in the hospital after you are discharged, you can call the unit and ask to speak with the hospitalist on call if the hospitalist that took care of you is not available.  Once you are discharged, your primary care physician will handle any further medical issues. Please note that NO REFILLS for any discharge medications will be authorized once you are discharged, as it is imperative that you return to your primary care physician (or establish a relationship with a primary care physician if you do not have one) for your aftercare needs so that they can reassess your need for medications and monitor your lab values   Increase activity slowly   Complete by: As directed    No wound care   Complete by: As directed      Allergies as of 10/27/2019      Reactions    Sulfa Antibiotics Rash      Medication List    TAKE these medications   acetaminophen 650 MG CR tablet Commonly known as: TYLENOL Take 650 mg by mouth every 4 (four) hours as needed (headache).   amLODipine 5 MG tablet Commonly known as: NORVASC Take 1 tablet (5 mg total) by mouth 2 (two) times daily.   carvedilol 12.5 MG tablet Commonly known as: COREG Take 1 tablet (12.5 mg total) by mouth See admin instructions. Taking 12.5 mg bid on non- Dialysis days, and on Dialysis days only taking 1 tab 12.5 at night What changed:   medication strength  how much to take  additional instructions   cloNIDine 0.1 MG tablet Commonly known as: CATAPRES Take 1 tablet (0.1 mg total) by mouth 3 (three) times daily.   latanoprost 0.005 % ophthalmic solution Commonly known as: XALATAN Place 1 drop into both eyes at bedtime.   levothyroxine 137 MCG tablet Commonly known as: SYNTHROID Take 137 mcg by mouth daily before breakfast.   LORazepam 0.5 MG tablet Commonly known as: ATIVAN Take 0.5 mg by mouth at  bedtime.   meclizine 12.5 MG tablet Commonly known as: ANTIVERT Take 12.5 mg by mouth 3 (three) times daily as needed for dizziness.   OVER THE COUNTER MEDICATION See admin instructions. Pt keeps sugar free candy or gum with her to keep her mouth from getting dry   pantoprazole 20 MG tablet Commonly known as: PROTONIX Take 1 tablet (20 mg total) by mouth daily. What changed:   when to take this  reasons to take this   Renal-Vite 0.8 MG Tabs Take 1 tablet by mouth daily with breakfast.   sevelamer carbonate 800 MG tablet Commonly known as: RENVELA Take 800-2,400 mg by mouth See admin instructions. Take 3 tablets (2400 mg) by mouth three times daily with meals, take 1-2 tablets ([phone]) with snacks   traMADol 50 MG tablet Commonly known as: ULTRAM Take 50 mg by mouth every 12 (twelve) hours as needed for moderate pain.   traZODone 50 MG tablet Commonly known as:  DESYREL Take 50 mg by mouth at bedtime.   Velphoro 500 MG chewable tablet Generic drug: sucroferric oxyhydroxide Chew 1,500 mg by mouth 3 (three) times daily with meals.       Follow-up Information    Stoneking, Hal, MD Follow up in 1 week(s).   Specialty: Internal Medicine Contact information: [address] [place] [address] [phone]        Jettie Booze, MD .   Specialties: Cardiology, Radiology, Interventional Cardiology Contact information: [address] [place] [address] [phone]              Allergies  Allergen Reactions  . Sulfa Antibiotics Rash    The results of significant diagnostics from this hospitalization (including imaging, microbiology, ancillary and laboratory) are listed below for reference.    Microbiology: Recent Results (from the past 240 hour(s))  Respiratory Panel by RT PCR (Flu A&B, Covid) - Nasopharyngeal Swab     Status: None   Collection Time: 10/23/19  5:41 PM   Specimen: Nasopharyngeal Swab  Result Value Ref Range Status   SARS Coronavirus 2 by RT PCR NEGATIVE NEGATIVE Final    Comment: (NOTE) SARS-CoV-2 target nucleic acids are NOT DETECTED.  The SARS-CoV-2 RNA is generally detectable in upper respiratoy specimens during the acute phase of infection. The lowest concentration of SARS-CoV-2 viral copies this assay can detect is 131 copies/mL. A negative result does not preclude SARS-Cov-2 infection and should not be used as the sole basis for treatment or other patient management decisions. A negative result may occur with  improper specimen collection/handling, submission of specimen other than nasopharyngeal swab, presence of viral mutation(s) within the areas targeted by this assay, and inadequate number of viral copies (<131 copies/mL). A negative result must be combined with clinical observations, patient history, and epidemiological information. The expected result is  Negative.  Fact Sheet for Patients:  PinkCheek.be  Fact Sheet for Healthcare Providers:  GravelBags.it  This test is no t yet approved or cleared by the [place] FDA and  has been authorized for detection and/or diagnosis of SARS-CoV-2 by FDA under an Emergency Use Authorization (EUA). This EUA will remain  in effect (meaning this test can be used) for the duration of the COVID-19 declaration under Section 564(b)(1) of the Act, 21 U.S.C. section 360bbb-3(b)(1), unless the authorization is terminated or revoked sooner.     Influenza A by PCR NEGATIVE NEGATIVE Final   Influenza B by PCR NEGATIVE NEGATIVE Final    Comment: (  NOTE) The Xpert Xpress SARS-CoV-2/FLU/RSV assay is intended as an aid in  the diagnosis of influenza from Nasopharyngeal swab specimens and  should not be used as a sole basis for treatment. Nasal washings and  aspirates are unacceptable for Xpert Xpress SARS-CoV-2/FLU/RSV  testing.  Fact Sheet for Patients: PinkCheek.be  Fact Sheet for Healthcare Providers: GravelBags.it  This test is not yet approved or cleared by the [place] FDA and  has been authorized for detection and/or diagnosis of SARS-CoV-2 by  FDA under an Emergency Use Authorization (EUA). This EUA will remain  in effect (meaning this test can be used) for the duration of the  Covid-19 declaration under Section 564(b)(1) of the Act, 21  U.S.C. section 360bbb-3(b)(1), unless the authorization is  terminated or revoked. Performed at [facility], [place] [address], [place], [place] [address]   MRSA PCR Screening     Status: None   Collection Time: 10/23/19  8:35 PM   Specimen: Nasal Mucosa; Nasopharyngeal  Result Value Ref Range Status   MRSA by PCR NEGATIVE NEGATIVE Final    Comment:        The GeneXpert MRSA Assay (FDA approved for NASAL specimens only), is one  component of a comprehensive MRSA colonization surveillance program. It is not intended to diagnose MRSA infection nor to guide or monitor treatment for MRSA infections. Performed at [facility], [place] [address] [place], [place] [address]     Procedures/Studies: CT HEAD WO CONTRAST  Result Date: 10/23/2019 CLINICAL DATA:  Headache, nausea, vomiting EXAM: CT HEAD WITHOUT CONTRAST TECHNIQUE: Contiguous axial images were obtained from the base of the skull through the vertex without intravenous contrast. COMPARISON:  None. FINDINGS: Brain: Normal anatomic configuration. No abnormal intra or extra-axial mass lesion or fluid collection. No abnormal mass effect or midline shift. No evidence of acute intracranial hemorrhage or infarct. Ventricular size is normal. Cerebellum unremarkable. Vascular: Unremarkable Skull: Intact Sinuses/Orbits: Paranasal sinuses are clear. Orbits are unremarkable. Other: Mastoid air cells and middle ear cavities are clear. IMPRESSION: Normal examination Electronically Signed   By: Fidela Salisbury MD   On: 10/23/2019 11:03   MR ANGIO HEAD WO CONTRAST  Result Date: 10/24/2019 CLINICAL DATA:  Headache.  Assess for aneurysm change or rupture. EXAM: MRI HEAD WITHOUT CONTRAST MRA HEAD WITHOUT CONTRAST TECHNIQUE: Multiplanar, multiecho pulse sequences of the brain and surrounding structures were obtained without intravenous contrast. Angiographic images of the head were obtained using MRA technique without contrast. COMPARISON:  Head CT yesterday. MR angiography 10/01/2019 and 09/01/2018. FINDINGS: MRI HEAD FINDINGS Brain: Diffusion imaging does not show any acute or subacute infarction. The brainstem and cerebellum are normal. Cerebral hemispheres are normal without evidence of large or small vessel infarction, mass lesion, hemorrhage, hydrocephalus or extra-axial collection. Vascular: Major vessels at the base of the brain show flow. Skull and upper cervical spine: Negative  Sinuses/Orbits: Clear/normal Other: None MRA HEAD FINDINGS No change since previous examinations. Both internal carotid arteries show flow through the skull base and siphon regions. Left paraclinoid aneurysm measuring 4 mm is unchanged. Right paraclinoid aneurysm measuring 3 mm is unchanged. Right supraclinoid aneurysm measuring 3 mm is unchanged. 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 Both anterior cerebral is receive there supply from the right carotid circulation. No anterior communicating aneurysm. More distal anterior circulation branch vessels show atherosclerotic irregularity. Right vertebral artery is dominant. Left vertebral artery is a  small vessel showing thready flow. Prominent anterior inferior cerebellar arteries on both sides. Mild atherosclerotic irregularity of the basilar without focal stenosis. Both superior cerebellar arteries are patent. Posterior cerebral arteries are patent, the left receiving most of it supply from the anterior circulation. More distal PCA branch vessels show atherosclerotic irregularity. IMPRESSION: 1. Normal MRI of the brain. No MR evidence of intracranial hemorrhage. 2. Intracranial MR angiography is unchanged. 4 mm aneurysm in the left paraclinoid region. 3 mm right paraclinoid aneurysm. 3 mm right supraclinoid aneurysm. Diffuse atherosclerotic irregularity of the more distal intracranial branch vessels. 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 Electronically Signed   By: Nelson Chimes M.D.   On: 10/24/2019 12:45   MR ANGIO HEAD WO CONTRAST  Result Date: 10/01/2019 CLINICAL DATA:  Aneurysm follow-up EXAM: MRA HEAD WITHOUT CONTRAST TECHNIQUE: Angiographic images of the Circle of Willis were obtained using MRA technique without intravenous contrast. COMPARISON:  MRA head 09/01/2018 FINDINGS: POSTERIOR CIRCULATION: --Vertebral arteries: Normal  V4 segments. --Inferior cerebellar arteries: Normal. --Basilar artery: Normal. --Superior cerebellar arteries: Normal. --Posterior cerebral arteries: Normal. ANTERIOR CIRCULATION: --Intracranial internal carotid arteries: Unchanged appearance of 4 mm left clinoid segment aneurysm, 3 mm right clinoid segment aneurysm and 3 mm right supraclinoid aneurysm. --Anterior cerebral arteries (ACA): Normal. Absent left A1 segment, normal variant --Middle cerebral arteries (MCA): Normal. 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 Electronically Signed   By: Ulyses Jarred M.D.   On: 10/01/2019 23:55   MR BRAIN WO CONTRAST  Result Date: 10/24/2019 CLINICAL DATA:  Headache.  Assess for aneurysm change or rupture. EXAM: MRI HEAD WITHOUT CONTRAST MRA HEAD WITHOUT CONTRAST TECHNIQUE: Multiplanar, multiecho pulse sequences of the brain and surrounding structures were obtained without intravenous contrast. Angiographic images of the head were obtained using MRA technique without contrast. COMPARISON:  Head CT yesterday. MR angiography 10/01/2019 and 09/01/2018. FINDINGS: MRI HEAD FINDINGS Brain: Diffusion imaging does not show any acute or subacute infarction. The brainstem and cerebellum are normal. Cerebral hemispheres are normal without evidence of large or small vessel infarction, mass lesion, hemorrhage, hydrocephalus or extra-axial collection. Vascular: Major vessels at the base of the brain show flow. Skull and upper cervical spine: Negative Sinuses/Orbits: Clear/normal Other: None MRA HEAD FINDINGS No change since previous examinations. Both internal carotid arteries show flow through the skull base and siphon regions. Left paraclinoid aneurysm measuring 4 mm is unchanged. Right paraclinoid aneurysm measuring 3 mm is unchanged. Right supraclinoid aneurysm measuring 3 mm is unchanged. Both anterior cerebral arteries are patent, with atherosclerotic irregularity including  a 50% stenosis of the left MCA just past the temporal artery. Both anterior cerebral is receive there supply from the right carotid circulation. No anterior communicating aneurysm. More distal anterior circulation branch vessels show atherosclerotic irregularity. Right vertebral artery is dominant. Left vertebral artery is a small vessel showing thready flow. Prominent anterior inferior cerebellar arteries on both sides. Mild atherosclerotic irregularity of the basilar without focal stenosis. Both superior cerebellar arteries are patent. Posterior cerebral arteries are patent, the left receiving most of it supply from the anterior circulation. More distal PCA branch vessels show atherosclerotic irregularity. IMPRESSION: 1. Normal MRI of the brain. No MR evidence of intracranial hemorrhage. 2. Intracranial MR angiography is unchanged. 4 mm aneurysm in the left paraclinoid region. 3 mm right paraclinoid aneurysm. 3 mm right supraclinoid aneurysm. Diffuse atherosclerotic irregularity of the more distal intracranial branch vessels. 50% stenosis of the left MCA just past  the temporal artery. Electronically Signed   By: Nelson Chimes M.D.   On: 10/24/2019 12:45    Labs: BNP (last 3 results) Recent Labs    02/28/2019 0100  BNP 7,591.6*   Basic Metabolic Panel: Recent Labs  Lab 10/23/19 1007 10/24/19 0328 10/25/19 1507 10/27/19 0450  NA 140 140 136 134*  K 3.5 4.2 4.4 5.4*  CL 96* 99 94* 96*  CO2 30 29 25 29   GLUCOSE 100* 116* 87 85  BUN 27* 40* 65* 44*  CREATININE 5.18* 7.70* 10.48* 8.24*  CALCIUM 8.6* 8.3* 8.9 8.2*  PHOS  --   --  2.9 4.0   Liver Function Tests: Recent Labs  Lab 10/23/19 1007 10/25/19 1507 10/27/19 0450  AST 25  --   --   ALT 28  --   --   ALKPHOS 41  --   --   BILITOT 1.1  --   --   PROT 7.1  --   --   ALBUMIN 4.1 3.4* 2.9*   No results for input(s): LIPASE, AMYLASE in the last 168 hours. No results for input(s): AMMONIA in the last 168 hours. CBC: Recent Labs   Lab 10/23/19 1007 10/24/19 0328 10/25/19 1507 10/27/19 0450  WBC 5.1 4.2 6.1 5.2  NEUTROABS 3.1  --   --   --   HGB 13.2 11.7* 12.4 10.7*  HCT 40.3 36.4 37.4 33.4*  MCV 96.6 97.1 96.1 98.5  PLT 167 154 162 133*   Cardiac Enzymes: No results for input(s): CKTOTAL, CKMB, CKMBINDEX, TROPONINI in the last 168 hours. BNP: Invalid input(s): POCBNP CBG: Recent Labs  Lab 10/23/19 1020 10/25/19 2215  GLUCAP 87 110*   D-Dimer No results for input(s): DDIMER in the last 72 hours. Hgb A1c No results for input(s): HGBA1C in the last 72 hours. Lipid Profile No results for input(s): CHOL, HDL, LDLCALC, TRIG, CHOLHDL, LDLDIRECT in the last 72 hours. Thyroid function studies No results for input(s): TSH, T4TOTAL, T3FREE, THYROIDAB in the last 72 hours.  Invalid input(s): FREET3 Anemia work up No results for input(s): VITAMINB12, FOLATE, FERRITIN, TIBC, IRON, RETICCTPCT in the last 72 hours. Urinalysis No results found for: COLORURINE, APPEARANCEUR, [place], [place], [place], [place], [place], [place], PROTEINUR, UROBILINOGEN, NITRITE, LEUKOCYTESUR Sepsis Labs Invalid input(s): PROCALCITONIN,  WBC,  LACTICIDVEN Microbiology Recent Results (from the past 240 hour(s))  Respiratory Panel by RT PCR (Flu A&B, Covid) - Nasopharyngeal Swab     Status: None   Collection Time: 10/23/19  5:41 PM   Specimen: Nasopharyngeal Swab  Result Value Ref Range Status   SARS Coronavirus 2 by RT PCR NEGATIVE NEGATIVE Final    Comment: (NOTE) SARS-CoV-2 target nucleic acids are NOT DETECTED.  The SARS-CoV-2 RNA is generally detectable in upper respiratoy specimens during the acute phase of infection. The lowest concentration of SARS-CoV-2 viral copies this assay can detect is 131 copies/mL. A negative result does not preclude SARS-Cov-2 infection and should not be used as the sole basis for treatment or other patient management decisions. A negative result may occur with  improper specimen  collection/handling, submission of specimen other than nasopharyngeal swab, presence of viral mutation(s) within the areas targeted by this assay, and inadequate number of viral copies (<131 copies/mL). A negative result must be combined with clinical observations, patient history, and epidemiological information. The expected result is Negative.  Fact Sheet for Patients:  PinkCheek.be  Fact Sheet for Healthcare Providers:  GravelBags.it  This test is no t yet approved or cleared by the  [place] Architectural [employer] and  has been authorized for detection and/or diagnosis of SARS-CoV-2 by FDA under an Print production planner (EUA). This EUA will remain  in effect (meaning this test can be used) for the duration of the COVID-19 declaration under Section 564(b)(1) of the Act, 21 U.S.C. section 360bbb-3(b)(1), unless the authorization is terminated or revoked sooner.     Influenza A by PCR NEGATIVE NEGATIVE Final   Influenza B by PCR NEGATIVE NEGATIVE Final    Comment: (NOTE) The Xpert Xpress SARS-CoV-2/FLU/RSV assay is intended as an aid in  the diagnosis of influenza from Nasopharyngeal swab specimens and  should not be used as a sole basis for treatment. Nasal washings and  aspirates are unacceptable for Xpert Xpress SARS-CoV-2/FLU/RSV  testing.  Fact Sheet for Patients: PinkCheek.be  Fact Sheet for Healthcare Providers: GravelBags.it  This test is not yet approved or cleared by the [place] FDA and  has been authorized for detection and/or diagnosis of SARS-CoV-2 by  FDA under an Emergency Use Authorization (EUA). This EUA will remain  in effect (meaning this test can be used) for the duration of the  Covid-19 declaration under Section 564(b)(1) of the Act, 21  U.S.C. section 360bbb-3(b)(1), unless the authorization is  terminated or revoked. Performed at [facility], [place] [address], [place], [place] [address]   MRSA PCR Screening     Status: None   Collection Time: 10/23/19  8:35 PM   Specimen: Nasal Mucosa; Nasopharyngeal  Result Value Ref Range Status   MRSA by PCR NEGATIVE NEGATIVE Final    Comment:        The GeneXpert MRSA Assay (FDA approved for NASAL specimens only), is one component of a comprehensive MRSA colonization surveillance program. It is not intended to diagnose MRSA infection nor to guide or monitor treatment for MRSA infections. Performed at [facility], [place] [address], [place], [place] [address]      Time coordinating discharge: 35 minutes  SIGNED: Antonieta Pert, MD  [facility] 10/27/2019, 3:30 PM  If 7PM-7AM, please contact night-coverage [email or web address]

## 2019-10-28 ENCOUNTER — Telehealth: Payer: Self-pay | Admitting: Nephrology

## 2019-10-28 NOTE — Telephone Encounter (Signed)
Transition of care contact from inpatient facility Patient called back and meds reviewed. Date of Discharge:10/27/19 Date of Contact: 10/28/19 Method of contact: phone Talked with: patient  Patient contact to discuss transition of care from recent inpatient hospitalization. Pateint was admitted to [facility] from 10/23/19-10/27/19  with the diagnosis of severe HA and hypertensive urgency.  Medication changes were reviewed.  Had several phone conversations about med changes and BP parameters  Patient will follow up at outpatient dialysis on 10/26- she will be seen by rounding PA that day  Other follow up needs:none  Amalia Hailey, PA-C [facility] Pager:  [phone]

## 2019-10-28 NOTE — Telephone Encounter (Signed)
Transition of care contact from inpatient facility  Date of Discharge: 10/27/19 Date of Contact: 10/28/19 Method of contact: Phone  Attempted to contact patient to discuss transition of care from inpatient admission. Patient did not answer the phone. Message was left on the patient's voicemail with call back number [phone].  Reinforced need to keep BP records and bring to dialysis and hold clonidine if SBP  < 120

## 2019-10-30 DIAGNOSIS — Z992 Dependence on renal dialysis: Secondary | ICD-10-CM | POA: Diagnosis not present

## 2019-10-30 DIAGNOSIS — I671 Cerebral aneurysm, nonruptured: Secondary | ICD-10-CM | POA: Diagnosis not present

## 2019-10-30 DIAGNOSIS — Z111 Encounter for screening for respiratory tuberculosis: Secondary | ICD-10-CM | POA: Diagnosis not present

## 2019-10-30 DIAGNOSIS — I161 Hypertensive emergency: Secondary | ICD-10-CM | POA: Diagnosis not present

## 2019-10-30 DIAGNOSIS — D509 Iron deficiency anemia, unspecified: Secondary | ICD-10-CM | POA: Diagnosis not present

## 2019-10-30 DIAGNOSIS — N186 End stage renal disease: Secondary | ICD-10-CM | POA: Diagnosis not present

## 2019-10-30 DIAGNOSIS — M310 Hypersensitivity angiitis: Secondary | ICD-10-CM | POA: Diagnosis not present

## 2019-10-30 DIAGNOSIS — N2581 Secondary hyperparathyroidism of renal origin: Secondary | ICD-10-CM | POA: Diagnosis not present

## 2019-10-30 DIAGNOSIS — R519 Headache, unspecified: Secondary | ICD-10-CM | POA: Diagnosis not present

## 2019-10-30 DIAGNOSIS — D688 Other specified coagulation defects: Secondary | ICD-10-CM | POA: Diagnosis not present

## 2019-10-30 DIAGNOSIS — R52 Pain, unspecified: Secondary | ICD-10-CM | POA: Diagnosis not present

## 2019-10-30 NOTE — Progress Notes (Signed)
Cardiology Office Note   Date:  10/31/2019   ID:  Cristina Wilson, DOB 02-25-1950, MRN [id number]  PCP:  Lajean Manes, MD    No chief complaint on file.  HTN  Wt Readings from Last 3 Encounters:  10/31/19 127 lb 12.8 oz (58 kg)  10/27/19 124 lb 1.9 oz (56.3 kg)  04/29/19 131 lb 6.4 oz (59.6 kg)       History of Present Illness: Cristina Wilson is a 69 y.o. female  With ESRD.  Her mother, Cristina Wilson was a patient of mine back in the late 2000s.  Brother had hypertrophic cardiomyopathy and AFib.   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  She did try peritoneal dialysis but this did not work for her.    Hospitalized in 10/21.  SHe has had some high BP readings during dialysis.    Hospital records showed: "Headache no ICH,2/2 Hypertensive crisis/hypertensive emergency:SBP more than 200 on presentation: Blood pressure has improved after adjusting recent regimen-with amlodipine 5 mg twice daily, clonidine 0.1 twice daily along with Coreg.  Patient to monitor blood pressure at least 2-3 times a day at home and personally discussed and instructed.  Discussed with nephrology and okay to discharge on current regimen.  Intracranial aneurysmss/pMRI/MRA 10/20 unchanged 4 mm aneurysm in the left paraclinoid region and 3 mm right supraclinoid aneurysm.  Continue blood pressure adjustment target less than 431 systolic."  LVEF was normal with normal valvular function in 9/21.  Stress echo in 9/21 showed: "  EKG: Resting EKG showed normal sinus rhythm. The patient developed  frequent premature ventricular contractions during exercise.     2D Echo Findings: The baseline ejection fraction was 60%. The peak  ejection fraction at stress was 65%. Baseline regional wall motion  abnormalities were not present. There were no stress-induced wall motion  abnormalities. This is a negative stress  echocardiogram for ischemia. Despite the  above described EKG changes,  there were no corresponding echocardiographic signs of ischemia. "  In the past she would have low BP with dialysis.   By phone, nephrology changed clonidine to prn if 160/100.   Overall, feels ok in the past few days.  No significant dizziness.  No syncope.       Past Medical History:  Diagnosis Date  . Abdominal pain 06/06/2018  . Acute renal failure (ARF) ([place]) 06/22/2018  . Anemia in chronic kidney disease 08/25/2018  . Anemia, unspecified 07/05/2018  . 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 Anxiety 07/05/2018  . Arthritis   . Bell's palsy 2017   "mild case"  . Cerebral aneurysm 08/2014   pt. states she has 2 aneurysms  . Chronic low back pain 04/12/2017  . Chronic pain syndrome 04/12/2017  . Closed fracture of left distal radius   . De Quervain's tenosynovitis 02/10/2018  . Degeneration of lumbar intervertebral disc 03/01/2017  . End stage renal failure on dialysis [facility])    M/W/F on [employer]  . GERD (gastroesophageal reflux disease)   . 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 Headache 10/30/2014  . Hyperlipidemia 04/17/2018  . Hypertension   . Hypothyroidism   . Iron deficiency anemia, unspecified 07/12/2018  . PONV (postoperative nausea and vomiting)    violent vomiting  . Sacral back pain 05/25/2016  . Scoliosis of lumbar spine 03/01/2017  . Secondary hyperparathyroidism of renal origin ([place]) 08/22/2018  . 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 Thyroid disease   .  Trigger finger of left thumb 02/10/2018    Past Surgical History:  Procedure Laterality Date  . ARTERY BIOPSY Right 10/31/2014   Procedure: BIOPSY TEMPORAL ARTERY-RIGHT;  Surgeon: Mal Misty, MD;  Location: [place];  Service: Vascular;  Laterality: Right;  . AV FISTULA PLACEMENT Left 10/11/2018   Procedure: left arm ARTERIOVENOUS (AV) FISTULA  creation;  Surgeon: Waynetta Sandy, MD;  Location: [place];  Service: Vascular;  Laterality: Left;  .  BILATERAL CARPAL TUNNEL RELEASE    . BIOPSY  04/29/2019   Procedure: BIOPSY;  Surgeon: Otis Brace, MD;  [facility];  Service: Gastroenterology;;  . BREAST SURGERY Left    lumpectomy  . BUNIONECTOMY Right   . CHOLECYSTECTOMY N/A 10/13/2016   Procedure: LAPAROSCOPIC CHOLECYSTECTOMY WITH INTRAOPERATIVE CHOLANGIOGRAM;  Surgeon: Donnie Mesa, MD;  Location: [place];  Service: General;  Laterality: N/A;  . ESOPHAGOGASTRODUODENOSCOPY (EGD) WITH PROPOFOL N/A 04/29/2019   Procedure: ESOPHAGOGASTRODUODENOSCOPY (EGD) WITH PROPOFOL;  Surgeon: Otis Brace, MD;  Location: [place];  Service: Gastroenterology;  Laterality: N/A;  . EYE SURGERY     surgery for cross eye  . FOOT FRACTURE SURGERY    . OPEN REDUCTION INTERNAL FIXATION (ORIF) DISTAL RADIAL FRACTURE Left 03/29/2017   Procedure: OPEN REDUCTION INTERNAL FIXATION (ORIF)LEFT  DISTAL RADIAL FRACTURE;  Surgeon: Leanora Cover, MD;  Location: [place];  Service: Orthopedics;  Laterality: Left;  . THYROIDECTOMY    . TONSILLECTOMY    . WISDOM TOOTH EXTRACTION       Current Outpatient Medications  Medication Sig Dispense Refill  . acetaminophen (TYLENOL) 650 MG CR tablet Take 650 mg by mouth every 4 (four) hours as needed (headache).    Marland Kitchen amLODipine (NORVASC) 5 MG tablet Take 1 tablet (5 mg total) by mouth 2 (two) times daily. 60 tablet 0  . B Complex-C-Folic Acid (RENAL-VITE) 0.8 MG TABS Take 1 tablet by mouth daily with breakfast.     . carvedilol (COREG) 12.5 MG tablet Take 1 tablet (12.5 mg total) by mouth See admin instructions. Taking 12.5 mg bid on non- Dialysis days, and on Dialysis days only taking 1 tab 12.5 at night 30 tablet 0  . cloNIDine (CATAPRES) 0.1 MG tablet Take 1 tablet (0.1 mg total) by mouth 3 (three) times daily. 90 tablet 1  . latanoprost (XALATAN) 0.005 % ophthalmic solution Place 1 drop into both eyes at bedtime.    Marland Kitchen levothyroxine (SYNTHROID) 137 MCG tablet Take 137 mcg by mouth daily  before breakfast.    . LORazepam (ATIVAN) 0.5 MG tablet Take 0.5 mg by mouth at bedtime.    . meclizine (ANTIVERT) 12.5 MG tablet Take 12.5 mg by mouth 3 (three) times daily as needed for dizziness.    Marland Kitchen OVER THE COUNTER MEDICATION See admin instructions. Pt keeps sugar free candy or gum with her to keep her mouth from getting dry    . sevelamer carbonate (RENVELA) 800 MG tablet Take 800-2,400 mg by mouth See admin instructions. Take 3 tablets (2400 mg) by mouth three times daily with meals, take 1-2 tablets ([phone]) with snacks    . sucroferric oxyhydroxide (VELPHORO) 500 MG chewable tablet Chew 1,500 mg by mouth 3 (three) times daily with meals.     . traMADol (ULTRAM) 50 MG tablet Take 50 mg by mouth every 12 (twelve) hours as needed for moderate pain.     . traZODone (DESYREL) 50 MG tablet Take 50 mg by mouth at bedtime.    . pantoprazole (PROTONIX) 20 MG  tablet Take 1 tablet (20 mg total) by mouth daily. (Patient taking differently: Take 20 mg by mouth daily as needed for heartburn or indigestion. ) 30 tablet 0   No current facility-administered medications for this visit.    Allergies:   Sulfa antibiotics    Social History:  The patient  reports that she quit smoking about 28 years ago. Her smoking use included cigarettes. She has a 10.00 pack-year smoking history. She has never used smokeless tobacco. She reports current alcohol use of about 7.0 standard drinks of alcohol per week. She reports previous drug use. Drug: Marijuana.   Family History:  The patient's family history includes COPD in her mother; Cancer in her father; Heart disease in her mother; Kidney disease in her mother.    ROS:  Please see the history of present illness.   Otherwise, review of systems are positive for anxiety.   All other systems are reviewed and negative.    PHYSICAL EXAM: VS:  BP (!) 146/72   Pulse 61   Ht 5\' 4"  (1.626 m)   Wt 127 lb 12.8 oz (58 kg)   SpO2 99%   BMI 21.94 kg/m  , BMI Body mass  index is 21.94 kg/m. GEN: Well nourished, well developed, in no acute distress  HEENT: normal  Neck: no JVD, carotid bruits, or masses Cardiac: RRR; no murmurs, rubs, or gallops,no edema  Respiratory:  clear to auscultation bilaterally, normal work of breathing GI: soft, nontender, nondistended, + BS MS: no deformity or atrophy  Skin: warm and dry, no rash Neuro:  Strength and sensation are intact Psych: euthymic mood, full affect   EKG:   The ekg ordered 10/23/19 demonstrates NSR   Recent Labs: 02/23/2019: B Natriuretic Peptide 1,154.7; TSH 3.291 10/23/2019: ALT 28 10/27/2019: BUN 44; Creatinine, Ser 8.24; Hemoglobin 10.7; Platelets 133; Potassium 5.4; Sodium 134   Lipid Panel No results found for: CHOL, TRIG, HDL, CHOLHDL, VLDL, LDLCALC, LDLDIRECT   Other studies Reviewed: Additional studies/ records that were reviewed today with results demonstrating: hospital records reviewed.   ASSESSMENT AND PLAN:  1. ESRD: Trying to get on transplant list. Stress test negative for ischemia.   2. HTN: Better control.  Continue using clonidine as needed.  Agree with current plan for meds.  Daily clonidine may be too much. 3. Hyperlipidemia: LDL 128.  Healthy diet at this time.  4. NSVT: 6 beat run at time of stress test. Normal LV function.  No ischemia.  No further therapy needed.    Current medicines are reviewed at length with the patient today.  The patient concerns regarding her medicines were addressed.  The following changes have been made:  No change  Labs/ tests ordered today include:  No orders of the defined types were placed in this encounter.   Recommend 150 minutes/week of aerobic exercise Low fat, low carb, high fiber diet recommended  Disposition:   FU in 1 year   Signed, Larae Grooms, MD  10/31/2019 11:04 AM    [facility] [place], [place], [place]  [address] Phone: [phone]; Fax: [phone]

## 2019-10-31 ENCOUNTER — Encounter: Payer: Self-pay | Admitting: Interventional Cardiology

## 2019-10-31 ENCOUNTER — Other Ambulatory Visit: Payer: Self-pay

## 2019-10-31 ENCOUNTER — Ambulatory Visit (INDEPENDENT_AMBULATORY_CARE_PROVIDER_SITE_OTHER): Payer: Medicare HMO | Admitting: Interventional Cardiology

## 2019-10-31 VITALS — BP 146/72 | HR 61 | Ht 64.0 in | Wt 127.8 lb

## 2019-10-31 DIAGNOSIS — E782 Mixed hyperlipidemia: Secondary | ICD-10-CM

## 2019-10-31 DIAGNOSIS — N186 End stage renal disease: Secondary | ICD-10-CM

## 2019-10-31 DIAGNOSIS — I10 Essential (primary) hypertension: Secondary | ICD-10-CM

## 2019-10-31 NOTE — Patient Instructions (Signed)

## 2019-11-01 ENCOUNTER — Other Ambulatory Visit (HOSPITAL_COMMUNITY)
Admission: RE | Admit: 2019-11-01 | Discharge: 2019-11-01 | Disposition: A | Discharge status: Home / Self Care | Payer: Medicare HMO | Source: Ambulatory Visit | Attending: Gastroenterology | Admitting: Gastroenterology

## 2019-11-01 DIAGNOSIS — R52 Pain, unspecified: Secondary | ICD-10-CM | POA: Diagnosis not present

## 2019-11-01 DIAGNOSIS — D509 Iron deficiency anemia, unspecified: Secondary | ICD-10-CM | POA: Diagnosis not present

## 2019-11-01 DIAGNOSIS — D688 Other specified coagulation defects: Secondary | ICD-10-CM | POA: Diagnosis not present

## 2019-11-01 DIAGNOSIS — Z20822 Contact with and (suspected) exposure to covid-19: Secondary | ICD-10-CM | POA: Diagnosis not present

## 2019-11-01 DIAGNOSIS — Z992 Dependence on renal dialysis: Secondary | ICD-10-CM | POA: Diagnosis not present

## 2019-11-01 DIAGNOSIS — Z111 Encounter for screening for respiratory tuberculosis: Secondary | ICD-10-CM | POA: Diagnosis not present

## 2019-11-01 DIAGNOSIS — N2581 Secondary hyperparathyroidism of renal origin: Secondary | ICD-10-CM | POA: Diagnosis not present

## 2019-11-01 DIAGNOSIS — M310 Hypersensitivity angiitis: Secondary | ICD-10-CM | POA: Diagnosis not present

## 2019-11-01 DIAGNOSIS — N186 End stage renal disease: Secondary | ICD-10-CM | POA: Diagnosis not present

## 2019-11-01 DIAGNOSIS — Z01812 Encounter for preprocedural laboratory examination: Secondary | ICD-10-CM | POA: Insufficient documentation

## 2019-11-01 LAB — SARS CORONAVIRUS 2 (TAT 6-24 HRS): SARS Coronavirus 2: NEGATIVE

## 2019-11-03 DIAGNOSIS — Z992 Dependence on renal dialysis: Secondary | ICD-10-CM | POA: Diagnosis not present

## 2019-11-03 DIAGNOSIS — M310 Hypersensitivity angiitis: Secondary | ICD-10-CM | POA: Diagnosis not present

## 2019-11-03 DIAGNOSIS — N2581 Secondary hyperparathyroidism of renal origin: Secondary | ICD-10-CM | POA: Diagnosis not present

## 2019-11-03 DIAGNOSIS — Z111 Encounter for screening for respiratory tuberculosis: Secondary | ICD-10-CM | POA: Diagnosis not present

## 2019-11-03 DIAGNOSIS — D509 Iron deficiency anemia, unspecified: Secondary | ICD-10-CM | POA: Diagnosis not present

## 2019-11-03 DIAGNOSIS — R52 Pain, unspecified: Secondary | ICD-10-CM | POA: Diagnosis not present

## 2019-11-03 DIAGNOSIS — N186 End stage renal disease: Secondary | ICD-10-CM | POA: Diagnosis not present

## 2019-11-03 DIAGNOSIS — D688 Other specified coagulation defects: Secondary | ICD-10-CM | POA: Diagnosis not present

## 2019-11-04 DIAGNOSIS — N019 Rapidly progressive nephritic syndrome with unspecified morphologic changes: Secondary | ICD-10-CM | POA: Diagnosis not present

## 2019-11-04 DIAGNOSIS — N186 End stage renal disease: Secondary | ICD-10-CM | POA: Diagnosis not present

## 2019-11-04 DIAGNOSIS — Z992 Dependence on renal dialysis: Secondary | ICD-10-CM | POA: Diagnosis not present

## 2019-11-05 ENCOUNTER — Ambulatory Visit (HOSPITAL_COMMUNITY)
Admission: RE | Admit: 2019-11-05 | Discharge: 2019-11-05 | Disposition: A | Discharge status: Home / Self Care | Payer: Medicare HMO | Attending: Gastroenterology | Admitting: Gastroenterology

## 2019-11-05 ENCOUNTER — Ambulatory Visit (HOSPITAL_COMMUNITY): Payer: Medicare HMO | Admitting: Anesthesiology

## 2019-11-05 ENCOUNTER — Other Ambulatory Visit: Payer: Self-pay

## 2019-11-05 ENCOUNTER — Encounter (HOSPITAL_COMMUNITY)
Admission: RE | Disposition: A | Discharge status: Home / Self Care | Payer: Self-pay | Source: Home / Self Care | Attending: Gastroenterology

## 2019-11-05 ENCOUNTER — Encounter (HOSPITAL_COMMUNITY): Payer: Self-pay | Admitting: Gastroenterology

## 2019-11-05 DIAGNOSIS — D124 Benign neoplasm of descending colon: Secondary | ICD-10-CM | POA: Insufficient documentation

## 2019-11-05 DIAGNOSIS — D125 Benign neoplasm of sigmoid colon: Secondary | ICD-10-CM | POA: Insufficient documentation

## 2019-11-05 DIAGNOSIS — K644 Residual hemorrhoidal skin tags: Secondary | ICD-10-CM | POA: Diagnosis not present

## 2019-11-05 DIAGNOSIS — Q438 Other specified congenital malformations of intestine: Secondary | ICD-10-CM | POA: Insufficient documentation

## 2019-11-05 DIAGNOSIS — K648 Other hemorrhoids: Secondary | ICD-10-CM | POA: Insufficient documentation

## 2019-11-05 DIAGNOSIS — N186 End stage renal disease: Secondary | ICD-10-CM | POA: Diagnosis not present

## 2019-11-05 DIAGNOSIS — Z9049 Acquired absence of other specified parts of digestive tract: Secondary | ICD-10-CM | POA: Insufficient documentation

## 2019-11-05 DIAGNOSIS — Z1211 Encounter for screening for malignant neoplasm of colon: Secondary | ICD-10-CM | POA: Diagnosis not present

## 2019-11-05 DIAGNOSIS — D123 Benign neoplasm of transverse colon: Secondary | ICD-10-CM | POA: Diagnosis not present

## 2019-11-05 DIAGNOSIS — Z79899 Other long term (current) drug therapy: Secondary | ICD-10-CM | POA: Insufficient documentation

## 2019-11-05 DIAGNOSIS — K635 Polyp of colon: Secondary | ICD-10-CM | POA: Diagnosis not present

## 2019-11-05 DIAGNOSIS — K5730 Diverticulosis of large intestine without perforation or abscess without bleeding: Secondary | ICD-10-CM | POA: Insufficient documentation

## 2019-11-05 DIAGNOSIS — Z882 Allergy status to sulfonamides status: Secondary | ICD-10-CM | POA: Diagnosis not present

## 2019-11-05 DIAGNOSIS — Z87891 Personal history of nicotine dependence: Secondary | ICD-10-CM | POA: Insufficient documentation

## 2019-11-05 DIAGNOSIS — Z79890 Hormone replacement therapy (postmenopausal): Secondary | ICD-10-CM | POA: Diagnosis not present

## 2019-11-05 HISTORY — PX: POLYPECTOMY: SHX5525

## 2019-11-05 HISTORY — PX: COLONOSCOPY WITH PROPOFOL: SHX5780

## 2019-11-05 HISTORY — PX: BIOPSY: SHX5522

## 2019-11-05 LAB — POCT I-STAT, CHEM 8
BUN: 50 mg/dL — ABNORMAL HIGH (ref 8–23)
Calcium, Ion: 1.05 mmol/L — ABNORMAL LOW (ref 1.15–1.40)
Chloride: 97 mmol/L — ABNORMAL LOW (ref 98–111)
Creatinine, Ser: 7.9 mg/dL — ABNORMAL HIGH (ref 0.44–1.00)
Glucose, Bld: 90 mg/dL (ref 70–99)
HCT: 33 % — ABNORMAL LOW (ref 36.0–46.0)
Hemoglobin: 11.2 g/dL — ABNORMAL LOW (ref 12.0–15.0)
Potassium: 5.3 mmol/L — ABNORMAL HIGH (ref 3.5–5.1)
Sodium: 138 mmol/L (ref 135–145)
TCO2: 37 mmol/L — ABNORMAL HIGH (ref 22–32)

## 2019-11-05 SURGERY — COLONOSCOPY WITH PROPOFOL
Anesthesia: Monitor Anesthesia Care

## 2019-11-05 MED ORDER — MIDAZOLAM HCL 5 MG/5ML IJ SOLN
INTRAMUSCULAR | Status: DC | PRN
  Administered 2019-11-05 (×2): .5 mg via INTRAVENOUS

## 2019-11-05 MED ORDER — ACETAMINOPHEN 500 MG PO TABS
500.0000 mg | ORAL_TABLET | Freq: Once | ORAL | Status: AC
  Administered 2019-11-05: 500 mg via ORAL

## 2019-11-05 MED ORDER — PROPOFOL 500 MG/50ML IV EMUL
INTRAVENOUS | Status: AC
  Filled 2019-11-05: qty 50

## 2019-11-05 MED ORDER — ACETAMINOPHEN 500 MG PO TABS
ORAL_TABLET | ORAL | Status: AC
  Filled 2019-11-05: qty 1

## 2019-11-05 MED ORDER — HYDRALAZINE HCL 20 MG/ML IJ SOLN
INTRAMUSCULAR | Status: DC | PRN
  Administered 2019-11-05: 2 mg via INTRAVENOUS
  Administered 2019-11-05: 5 mg via INTRAVENOUS

## 2019-11-05 MED ORDER — PROPOFOL 10 MG/ML IV BOLUS
INTRAVENOUS | Status: DC | PRN
  Administered 2019-11-05 (×3): 10 mg via INTRAVENOUS
  Administered 2019-11-05: 20 mg via INTRAVENOUS
  Administered 2019-11-05: 30 mg via INTRAVENOUS
  Administered 2019-11-05 (×2): 20 mg via INTRAVENOUS
  Administered 2019-11-05 (×2): 10 mg via INTRAVENOUS
  Administered 2019-11-05: 30 mg via INTRAVENOUS
  Administered 2019-11-05: 10 mg via INTRAVENOUS

## 2019-11-05 MED ORDER — FENTANYL CITRATE (PF) 100 MCG/2ML IJ SOLN
INTRAMUSCULAR | Status: AC
  Filled 2019-11-05: qty 2

## 2019-11-05 MED ORDER — SODIUM CHLORIDE 0.9 % IV SOLN
INTRAVENOUS | Status: DC
  Administered 2019-11-05: 500 mL via INTRAVENOUS

## 2019-11-05 MED ORDER — FENTANYL CITRATE (PF) 100 MCG/2ML IJ SOLN
INTRAMUSCULAR | Status: DC | PRN
  Administered 2019-11-05 (×4): 25 ug via INTRAVENOUS

## 2019-11-05 MED ORDER — MIDAZOLAM HCL 2 MG/2ML IJ SOLN
INTRAMUSCULAR | Status: AC
  Filled 2019-11-05: qty 2

## 2019-11-05 MED ORDER — LIDOCAINE HCL 1 % IJ SOLN
INTRAMUSCULAR | Status: DC | PRN
  Administered 2019-11-05: 50 mg via INTRADERMAL

## 2019-11-05 MED ORDER — PROPOFOL 500 MG/50ML IV EMUL
INTRAVENOUS | Status: DC | PRN
  Administered 2019-11-05: 175 ug/kg/min via INTRAVENOUS

## 2019-11-05 MED ORDER — DEXAMETHASONE SODIUM PHOSPHATE 4 MG/ML IJ SOLN
INTRAMUSCULAR | Status: DC | PRN
  Administered 2019-11-05: 4 mg via INTRAVENOUS

## 2019-11-05 MED ORDER — ONDANSETRON HCL 4 MG/2ML IJ SOLN
INTRAMUSCULAR | Status: DC | PRN
  Administered 2019-11-05: 4 mg via INTRAVENOUS

## 2019-11-05 SURGICAL SUPPLY — 21 items

## 2019-11-05 NOTE — H&P (Signed)
Primary Care Physician:  Lajean Manes, MD Primary Gastroenterologist:  [facility]  Reason for visit : Outpatient screening colonoscopy  HPI: Cristina Wilson is a 69 y.o. female with past medical history of end-stage renal disease and Goodpasture syndrome here for outpatient screening colonoscopy.  Last colonoscopy August 2010 by Dr. Wynetta Emery was normal.  She is complaining of occasional constipation requiring use of MiraLAX and occasional loose stools.  Abdominal bloating with use of binders for her kidney disease.  Denies abdominal pain, blood in the stool or black stool.  Occasional dark stools which she contributes to using binders.  Blood work on October 27, 2019 showed hemoglobin of 10.7.  She was admitted to the hospital in April 2021 for acute drop in hemoglobin.  EGD at that time showed mild gastritis and small hyperplastic polyp in the gastric cardia.  Gastric biopsies were negative for H. pylori.  Outpatient colonoscopy was recommended.  Past Medical History:  Diagnosis Date  . Abdominal pain 06/06/2018  . Acute renal failure (ARF) ([place]) 06/22/2018  . Anemia in chronic kidney disease 08/25/2018  . Anemia, unspecified 07/05/2018  . 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 Anxiety 07/05/2018  . Arthritis   . Bell's palsy 2017   "mild case"  . Cerebral aneurysm 08/2014   pt. states she has 2 aneurysms  . Chronic low back pain 04/12/2017  . Chronic pain syndrome 04/12/2017  . Closed fracture of left distal radius   . De Quervain's tenosynovitis 02/10/2018  . Degeneration of lumbar intervertebral disc 03/01/2017  . End stage renal failure on dialysis [facility])    M/W/F on [employer]  . GERD (gastroesophageal reflux disease)   . Goodpasture syndrome ([place])   . Headache 10/30/2014  . Hyperlipidemia 04/17/2018  . Hypertension   . Hypothyroidism   . Iron deficiency anemia, unspecified 07/12/2018  . PONV (postoperative nausea and vomiting)    violent vomiting  .  Sacral back pain 05/25/2016  . Scoliosis of lumbar spine 03/01/2017  . Secondary hyperparathyroidism of renal origin ([place]) 08/22/2018  . 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 Thyroid disease   . Trigger finger of left thumb 02/10/2018    Past Surgical History:  Procedure Laterality Date  . ARTERY BIOPSY Right 10/31/2014   Procedure: BIOPSY TEMPORAL ARTERY-RIGHT;  Surgeon: Mal Misty, MD;  Location: [place];  Service: Vascular;  Laterality: Right;  . AV FISTULA PLACEMENT Left 10/11/2018   Procedure: left arm ARTERIOVENOUS (AV) FISTULA  creation;  Surgeon: Waynetta Sandy, MD;  Location: [place];  Service: Vascular;  Laterality: Left;  . BILATERAL CARPAL TUNNEL RELEASE    . BIOPSY  04/29/2019   Procedure: BIOPSY;  Surgeon: Otis Brace, MD;  [facility];  Service: Gastroenterology;;  . BREAST SURGERY Left    lumpectomy  . BUNIONECTOMY Right   . CHOLECYSTECTOMY N/A 10/13/2016   Procedure: LAPAROSCOPIC CHOLECYSTECTOMY WITH INTRAOPERATIVE CHOLANGIOGRAM;  Surgeon: Donnie Mesa, MD;  Location: [place];  Service: General;  Laterality: N/A;  . ESOPHAGOGASTRODUODENOSCOPY (EGD) WITH PROPOFOL N/A 04/29/2019   Procedure: ESOPHAGOGASTRODUODENOSCOPY (EGD) WITH PROPOFOL;  Surgeon: Otis Brace, MD;  Location: [place];  Service: Gastroenterology;  Laterality: N/A;  . EYE SURGERY     surgery for cross eye  . FOOT FRACTURE SURGERY    . OPEN REDUCTION INTERNAL FIXATION (ORIF) DISTAL RADIAL FRACTURE Left 03/29/2017   Procedure: OPEN REDUCTION INTERNAL FIXATION (ORIF)LEFT  DISTAL RADIAL FRACTURE;  Surgeon: Leanora Cover, MD;  Location: [place];  Service:  Orthopedics;  Laterality: Left;  . THYROIDECTOMY    . TONSILLECTOMY    . WISDOM TOOTH EXTRACTION      Prior to Admission medications   Medication Sig Start Date End Date Taking? Authorizing Provider  acetaminophen (TYLENOL) 650 MG CR tablet Take 650 mg by mouth every 4 (four) hours as needed  (headache).   Yes [provider]  amLODipine (NORVASC) 5 MG tablet Take 1 tablet (5 mg total) by mouth 2 (two) times daily. 10/27/19 11/26/19 Yes Antonieta Pert, MD  B Complex-C-Folic Acid (RENAL-VITE) 0.8 MG TABS Take 1 tablet by mouth daily with breakfast.  07/31/18  Yes [provider]  carvedilol (COREG) 12.5 MG tablet Take 1 tablet (12.5 mg total) by mouth See admin instructions. Taking 12.5 mg bid on non- Dialysis days, and on Dialysis days only taking 1 tab 12.5 at night 10/27/19 11/26/19 Yes Kc, Maren Beach, MD  cloNIDine (CATAPRES) 0.1 MG tablet Take 1 tablet (0.1 mg total) by mouth 3 (three) times daily. 10/27/19 12/26/19 Yes Kc, Maren Beach, MD  latanoprost (XALATAN) 0.005 % ophthalmic solution Place 1 drop into both eyes at bedtime.   Yes [provider]  levothyroxine (SYNTHROID) 137 MCG tablet Take 137 mcg by mouth daily before breakfast.   Yes [provider]  LORazepam (ATIVAN) 0.5 MG tablet Take 0.5 mg by mouth at bedtime. 09/22/19  Yes [provider]  meclizine (ANTIVERT) 12.5 MG tablet Take 12.5 mg by mouth 3 (three) times daily as needed for dizziness. 09/27/19  Yes [provider]  traZODone (DESYREL) 50 MG tablet Take 50 mg by mouth at bedtime.   Yes [provider]  OVER THE COUNTER MEDICATION See admin instructions. Pt keeps sugar free candy or gum with her to keep her mouth from getting dry    [provider]  pantoprazole (PROTONIX) 20 MG tablet Take 1 tablet (20 mg total) by mouth daily. Patient taking differently: Take 20 mg by mouth daily as needed for heartburn or indigestion.  04/30/19 10/23/19  Terrilee Croak, MD  sevelamer carbonate (RENVELA) 800 MG tablet Take 800-2,400 mg by mouth See admin instructions. Take 3 tablets (2400 mg) by mouth three times daily with meals, take 1-2 tablets [phone] with snacks    [provider]  sucroferric oxyhydroxide (VELPHORO) 500 MG chewable tablet Chew 1,500 mg by  mouth 3 (three) times daily with meals.     [provider]  traMADol (ULTRAM) 50 MG tablet Take 50 mg by mouth every 12 (twelve) hours as needed for moderate pain.  08/31/19   [provider]    Scheduled Meds: Continuous Infusions: . sodium chloride     PRN Meds:.  Allergies as of 09/26/2019 - Review Complete 04/29/2019  Allergen Reaction Noted  . Sulfa antibiotics Rash 08/08/2011    Family History  Problem Relation Age of Onset  . COPD Mother   . Kidney disease Mother   . Heart disease Mother   . Cancer Father     Social History   Socioeconomic History  . Marital status: Single    Spouse name: Not on file  . Number of children: Not on file  . Years of education: Not on file  . Highest education level: Not on file  Occupational History  . Not on file  Tobacco Use  . Smoking status: Former Smoker    Packs/day: 0.50    Years: 20.00    Pack years: 10.00    Types: Cigarettes    Quit date: 1993  Years since quitting: 28.8  . Smokeless tobacco: Never Used  Vaping Use  . Vaping Use: Never used  Substance and Sexual Activity  . Alcohol use: Yes    Alcohol/week: 7.0 standard drinks    Types: 7 Glasses of wine per week    Comment: a glass a night, some nights more  . Drug use: Not Currently    Types: Marijuana    Comment: last used 1993  . Sexual activity: Not on file  Other Topics Concern  . Not on file  Social History Narrative  . Not on file   Social Determinants of Health   Financial Resource Strain:   . Difficulty of Paying Living Expenses: Not on file  Food Insecurity:   . Worried About Charity fundraiser in the Last Year: Not on file  . Ran Out of Food in the Last Year: Not on file  Transportation Needs:   . Lack of Transportation (Medical): Not on file  . Lack of Transportation (Non-Medical): Not on file  Physical Activity:   . Days of Exercise per Week: Not on file  . Minutes of Exercise per Session: Not on file  Stress:   .  Feeling of Stress : Not on file  Social Connections:   . Frequency of Communication with Friends and Family: Not on file  . Frequency of Social Gatherings with Friends and Family: Not on file  . Attends Religious Services: Not on file  . Active Member of Clubs or Organizations: Not on file  . Attends Archivist Meetings: Not on file  . Marital Status: Not on file  Intimate Partner Violence:   . Fear of Current or Ex-Partner: Not on file  . Emotionally Abused: Not on file  . Physically Abused: Not on file  . Sexually Abused: Not on file      Vital signs: Vitals:   11/05/19 0902  BP: (!) 160/67  Resp: 16  Temp: 97.9 F (36.6 C)  SpO2: 100%     General:   Alert,  Well-developed, well-nourished, pleasant and cooperative in NAD Lungs:  Clear throughout to auscultation.   No wheezes, crackles, or rhonchi. No acute distress. Heart:  Regular rate and rhythm; no murmurs, clicks, rubs,  or gallops. Abdomen: Soft, nontender, nondistended, bowel sounds present.  No peritoneal signs Rectal:  Deferred  GI:  Lab Results: No results for input(s): WBC, HGB, HCT, PLT in the last 72 hours. BMET No results for input(s): NA, K, CL, CO2, GLUCOSE, BUN, CREATININE, CALCIUM in the last 72 hours. LFT No results for input(s): PROT, ALBUMIN, AST, ALT, ALKPHOS, BILITOT, BILIDIR, IBILI in the last 72 hours. PT/INR No results for input(s): LABPROT, INR in the last 72 hours.   Studies/Results: No results found.  Impression/Plan: -Colon cancer screening -End-stage renal disease  -History of anemia.  Most likely multifactorial.  Patient denies any overt bleeding.  Recommendations ------------------------ -Proceed with colonoscopy today.  Risks (bleeding, infection, bowel perforation that could require surgery, sedation-related changes in cardiopulmonary systems), benefits (identification and possible treatment of source of symptoms, exclusion of certain causes of symptoms), and  alternatives (watchful waiting, radiographic imaging studies, empiric medical treatment)  were explained to patient/family in detail and patient wishes to proceed.    LOS: 0 days   Otis Brace  MD, FACP 11/05/2019, 9:10 AM  Contact #  [phone]

## 2019-11-05 NOTE — Transfer of Care (Signed)
Immediate Anesthesia Transfer of Care Note  Patient: Cristina Wilson  Procedure(s) Performed: COLONOSCOPY WITH PROPOFOL (N/A ) POLYPECTOMY BIOPSY  Patient Location: PACU and Endoscopy Unit  Anesthesia Type:MAC  Level of Consciousness: awake, alert , oriented and patient cooperative  Airway & Oxygen Therapy: Patient Spontanous Breathing and Patient connected to face mask oxygen  Post-op Assessment: Report given to RN and Post -op Vital signs reviewed and stable  Post vital signs: Reviewed and stable  Last Vitals:  Vitals Value Taken Time  BP    Temp    Pulse 66 11/05/19 1118  Resp 14 11/05/19 1118  SpO2 100 % 11/05/19 1118  Vitals shown include unvalidated device data.  Last Pain:  Vitals:   11/05/19 0902  TempSrc: Oral  PainSc: 0-No pain         Complications: No complications documented.

## 2019-11-05 NOTE — Op Note (Signed)
[facility] Patient Name: Cristina Wilson Procedure Date: 11/05/2019 MRN: [id number] Attending MD: Otis Brace , MD Date of Birth: 10-Jun-1950 CSN: [id number] Age: 69 Admit Type: Outpatient Procedure:                Colonoscopy Indications:              Screening for colorectal malignant neoplasm, Last                            colonoscopy: 2010 Providers:                Otis Brace, MD, Cleda Daub, RN, Fransico Setters                            [place], Technician Referring MD:              Medicines:                Sedation Administered by an Anesthesia Professional Complications:            No immediate complications. Estimated Blood Loss:     Estimated blood loss was minimal. Procedure:                Pre-Anesthesia Assessment:                           - Prior to the procedure, a History and Physical                            was performed, and patient medications and                            allergies were reviewed. The patient's tolerance of                            previous anesthesia was also reviewed. The risks                            and benefits of the procedure and the sedation                            options and risks were discussed with the patient.                            All questions were answered, and informed consent                            was obtained. Prior Anticoagulants: The patient has                            taken no previous anticoagulant or antiplatelet                            agents. ASA Grade Assessment: III - A patient with  severe systemic disease. After reviewing the risks                            and benefits, the patient was deemed in                            satisfactory condition to undergo the procedure.                           After obtaining informed consent, the colonoscope                            was passed under direct vision. Throughout the                             procedure, the patient's blood pressure, pulse, and                            oxygen saturations were monitored continuously. The                            PCF-[id number] ([id number] Olympus pediatric colonscope                            was introduced through the anus and advanced to the                            the cecum, identified by appendiceal orifice and                            ileocecal valve. The colonoscopy was technically                            difficult and complex due to the patient's                            inability to tolerate conscious sedation and                            ineffective sedation. The patient tolerated the                            procedure. The quality of the bowel preparation was                            adequate to identify polyps 6 mm and larger in size. Scope In: 10:28:08 AM Scope Out: 11:06:00 AM Scope Withdrawal Time: 0 hours 12 minutes 25 seconds  Total Procedure Duration: 0 hours 37 minutes 52 seconds  Findings:      Skin tags were found on perianal exam.      The colon (entire examined portion) was moderately redundant. Advancing       the scope required applying abdominal pressure.      A 3 mm polyp was found in the proximal transverse colon. The polyp  was       removed with a cold biopsy forceps. Resection and retrieval were       complete.      A 3 mm polyp was found in the descending colon. The polyp was sessile.       The polyp was removed with a cold biopsy forceps. Resection and       retrieval were complete.      Two sessile polyps were found in the descending colon. The polyps were 3       to 4 mm in size. These polyps were removed with a cold snare. Resection       and retrieval were complete.      A 2 mm polyp was found in the sigmoid colon. The polyp was sessile. The       polyp was removed with a cold biopsy forceps. Resection and retrieval       were complete.      Normal mucosa was found in the entire colon.  Biopsies for histology were       taken with a cold forceps from the ascending colon, transverse colon and       descending colon for evaluation of microscopic colitis.      A few diverticula were found in the sigmoid colon.      Internal hemorrhoids were found during retroflexion. The hemorrhoids       were medium-sized. Impression:               - Perianal skin tags found on perianal exam.                           - Redundant colon.                           - One 3 mm polyp in the proximal transverse colon,                            removed with a cold biopsy forceps. Resected and                            retrieved.                           - One 3 mm polyp in the descending colon, removed                            with a cold biopsy forceps. Resected and retrieved.                           - Two 3 to 4 mm polyps in the descending colon,                            removed with a cold snare. Resected and retrieved.                           - One 2 mm polyp in the sigmoid colon, removed with  a cold biopsy forceps. Resected and retrieved.                           - Normal mucosa in the entire examined colon.                            Biopsied.                           - Diverticulosis in the sigmoid colon.                           - Internal hemorrhoids. Moderate Sedation:      Moderate (conscious) sedation was personally administered by an       anesthesia professional. The following parameters were monitored: oxygen       saturation, heart rate, blood pressure, and response to care. Recommendation:           - Patient has a contact number available for                            emergencies. The signs and symptoms of potential                            delayed complications were discussed with the                            patient. Return to normal activities tomorrow.                            Written discharge instructions were provided to  the                            patient.                           - Resume previous diet.                           - Continue present medications.                           - Await pathology results.                           - Repeat colonoscopy date to be determined after                            pending pathology results are reviewed for                            surveillance based on pathology results.                           - Return to my office as previously scheduled. Procedure Code(s):        --- Professional ---  45385, Colonoscopy, flexible; with removal of                            tumor(s), polyp(s), or other lesion(s) by snare                            technique                           45380, 59, Colonoscopy, flexible; with biopsy,                            single or multiple Diagnosis Code(s):        --- Professional ---                           K63.5, Polyp of colon                           Z12.11, Encounter for screening for malignant                            neoplasm of colon                           K64.8, Other hemorrhoids                           K64.4, Residual hemorrhoidal skin tags                           K57.30, Diverticulosis of large intestine without                            perforation or abscess without bleeding                           Q43.8, Other specified congenital malformations of                            intestine CPT copyright 2019 American Medical Association. All rights reserved. The codes documented in this report are preliminary and upon coder review may  be revised to meet current compliance requirements. Otis Brace, MD Otis Brace, MD 11/05/2019 11:15:05 AM Number of Addenda: 0

## 2019-11-05 NOTE — Anesthesia Postprocedure Evaluation (Signed)
Anesthesia Post Note  Patient: Cristina Wilson  Procedure(s) Performed: COLONOSCOPY WITH PROPOFOL (N/A ) POLYPECTOMY BIOPSY     Patient location during evaluation: Endoscopy Anesthesia Type: MAC Level of consciousness: awake and alert Pain management: pain level controlled Vital Signs Assessment: post-procedure vital signs reviewed and stable Respiratory status: spontaneous breathing, nonlabored ventilation, respiratory function stable and patient connected to nasal cannula oxygen Cardiovascular status: stable and blood pressure returned to baseline Postop Assessment: no apparent nausea or vomiting Anesthetic complications: no   No complications documented.  Last Vitals:  Vitals:   11/05/19 1140 11/05/19 1150  BP: (!) 188/60 (!) 142/52  Pulse: 64 (!) 59  Resp: 16 12  Temp:    SpO2: 100% 99%    Last Pain:  Vitals:   11/05/19 1150  TempSrc:   PainSc: 3                  [place] Frieden P Ketrick Matney

## 2019-11-05 NOTE — Discharge Instructions (Signed)

## 2019-11-05 NOTE — Anesthesia Preprocedure Evaluation (Addendum)
Anesthesia Evaluation  Patient identified by MRN, date of birth, ID band Patient awake    Reviewed: Allergy & Precautions, NPO status , Patient's Chart, lab work & pertinent test results  History of Anesthesia Complications (+) PONV and history of anesthetic complications  Airway Mallampati: II  TM Distance: >3 FB Neck ROM: Full    Dental no notable dental hx.    Pulmonary former smoker,    Pulmonary exam normal breath sounds clear to auscultation       Cardiovascular hypertension, Pt. on medications and Pt. on home beta blockers Normal cardiovascular exam Rhythm:Regular Rate:Normal  ECG: SR, rate 65  1. This is a negative stress echocardiogram for ischemia.  2. This is a low risk study.    Neuro/Psych  Headaches, Anxiety    GI/Hepatic Neg liver ROS, GERD  Controlled,  Endo/Other  Hypothyroidism   Renal/GU ESRF and DialysisRenal disease     Musculoskeletal  (+) Arthritis , Scoliosis of lumbar spine Chronic pain syndrome   Abdominal   Peds  Hematology  (+) anemia ,   Anesthesia Other Findings   Reproductive/Obstetrics                            Anesthesia Physical Anesthesia Plan  ASA: III  Anesthesia Plan: MAC   Post-op Pain Management:    Induction: Intravenous  PONV Risk Score and Plan: 3 and Propofol infusion, Treatment may vary due to age or medical condition, Ondansetron and Dexamethasone  Airway Management Planned: Mask  Additional Equipment:   Intra-op Plan:   Post-operative Plan:   Informed Consent: I have reviewed the patients History and Physical, chart, labs and discussed the procedure including the risks, benefits and alternatives for the proposed anesthesia with the patient or authorized representative who has indicated his/her understanding and acceptance.     Dental advisory given  Plan Discussed with: CRNA  Anesthesia Plan Comments:          Anesthesia Quick Evaluation

## 2019-11-06 DIAGNOSIS — D509 Iron deficiency anemia, unspecified: Secondary | ICD-10-CM | POA: Diagnosis not present

## 2019-11-06 DIAGNOSIS — N2581 Secondary hyperparathyroidism of renal origin: Secondary | ICD-10-CM | POA: Diagnosis not present

## 2019-11-06 DIAGNOSIS — Z23 Encounter for immunization: Secondary | ICD-10-CM | POA: Diagnosis not present

## 2019-11-06 DIAGNOSIS — I129 Hypertensive chronic kidney disease with stage 1 through stage 4 chronic kidney disease, or unspecified chronic kidney disease: Secondary | ICD-10-CM | POA: Diagnosis not present

## 2019-11-06 DIAGNOSIS — N186 End stage renal disease: Secondary | ICD-10-CM | POA: Diagnosis not present

## 2019-11-06 DIAGNOSIS — D631 Anemia in chronic kidney disease: Secondary | ICD-10-CM | POA: Diagnosis not present

## 2019-11-06 DIAGNOSIS — D688 Other specified coagulation defects: Secondary | ICD-10-CM | POA: Diagnosis not present

## 2019-11-06 DIAGNOSIS — M310 Hypersensitivity angiitis: Secondary | ICD-10-CM | POA: Diagnosis not present

## 2019-11-06 DIAGNOSIS — Z992 Dependence on renal dialysis: Secondary | ICD-10-CM | POA: Diagnosis not present

## 2019-11-06 LAB — SURGICAL PATHOLOGY

## 2019-11-07 ENCOUNTER — Encounter (HOSPITAL_COMMUNITY): Payer: Self-pay | Admitting: Gastroenterology

## 2019-11-08 DIAGNOSIS — D688 Other specified coagulation defects: Secondary | ICD-10-CM | POA: Diagnosis not present

## 2019-11-08 DIAGNOSIS — N186 End stage renal disease: Secondary | ICD-10-CM | POA: Diagnosis not present

## 2019-11-08 DIAGNOSIS — D631 Anemia in chronic kidney disease: Secondary | ICD-10-CM | POA: Diagnosis not present

## 2019-11-08 DIAGNOSIS — Z992 Dependence on renal dialysis: Secondary | ICD-10-CM | POA: Diagnosis not present

## 2019-11-08 DIAGNOSIS — M310 Hypersensitivity angiitis: Secondary | ICD-10-CM | POA: Diagnosis not present

## 2019-11-08 DIAGNOSIS — D509 Iron deficiency anemia, unspecified: Secondary | ICD-10-CM | POA: Diagnosis not present

## 2019-11-08 DIAGNOSIS — N2581 Secondary hyperparathyroidism of renal origin: Secondary | ICD-10-CM | POA: Diagnosis not present

## 2019-11-10 DIAGNOSIS — D688 Other specified coagulation defects: Secondary | ICD-10-CM | POA: Diagnosis not present

## 2019-11-10 DIAGNOSIS — Z992 Dependence on renal dialysis: Secondary | ICD-10-CM | POA: Diagnosis not present

## 2019-11-10 DIAGNOSIS — M310 Hypersensitivity angiitis: Secondary | ICD-10-CM | POA: Diagnosis not present

## 2019-11-10 DIAGNOSIS — N186 End stage renal disease: Secondary | ICD-10-CM | POA: Diagnosis not present

## 2019-11-10 DIAGNOSIS — D509 Iron deficiency anemia, unspecified: Secondary | ICD-10-CM | POA: Diagnosis not present

## 2019-11-10 DIAGNOSIS — N2581 Secondary hyperparathyroidism of renal origin: Secondary | ICD-10-CM | POA: Diagnosis not present

## 2019-11-10 DIAGNOSIS — D631 Anemia in chronic kidney disease: Secondary | ICD-10-CM | POA: Diagnosis not present

## 2019-11-13 DIAGNOSIS — D631 Anemia in chronic kidney disease: Secondary | ICD-10-CM | POA: Diagnosis not present

## 2019-11-13 DIAGNOSIS — D688 Other specified coagulation defects: Secondary | ICD-10-CM | POA: Diagnosis not present

## 2019-11-13 DIAGNOSIS — D509 Iron deficiency anemia, unspecified: Secondary | ICD-10-CM | POA: Diagnosis not present

## 2019-11-13 DIAGNOSIS — N2581 Secondary hyperparathyroidism of renal origin: Secondary | ICD-10-CM | POA: Diagnosis not present

## 2019-11-13 DIAGNOSIS — N186 End stage renal disease: Secondary | ICD-10-CM | POA: Diagnosis not present

## 2019-11-13 DIAGNOSIS — Z992 Dependence on renal dialysis: Secondary | ICD-10-CM | POA: Diagnosis not present

## 2019-11-13 DIAGNOSIS — M310 Hypersensitivity angiitis: Secondary | ICD-10-CM | POA: Diagnosis not present

## 2019-11-15 DIAGNOSIS — N2581 Secondary hyperparathyroidism of renal origin: Secondary | ICD-10-CM | POA: Diagnosis not present

## 2019-11-15 DIAGNOSIS — D509 Iron deficiency anemia, unspecified: Secondary | ICD-10-CM | POA: Diagnosis not present

## 2019-11-15 DIAGNOSIS — N186 End stage renal disease: Secondary | ICD-10-CM | POA: Diagnosis not present

## 2019-11-15 DIAGNOSIS — Z992 Dependence on renal dialysis: Secondary | ICD-10-CM | POA: Diagnosis not present

## 2019-11-15 DIAGNOSIS — D631 Anemia in chronic kidney disease: Secondary | ICD-10-CM | POA: Diagnosis not present

## 2019-11-15 DIAGNOSIS — M310 Hypersensitivity angiitis: Secondary | ICD-10-CM | POA: Diagnosis not present

## 2019-11-15 DIAGNOSIS — D688 Other specified coagulation defects: Secondary | ICD-10-CM | POA: Diagnosis not present

## 2019-11-17 DIAGNOSIS — D631 Anemia in chronic kidney disease: Secondary | ICD-10-CM | POA: Diagnosis not present

## 2019-11-17 DIAGNOSIS — Z992 Dependence on renal dialysis: Secondary | ICD-10-CM | POA: Diagnosis not present

## 2019-11-17 DIAGNOSIS — M310 Hypersensitivity angiitis: Secondary | ICD-10-CM | POA: Diagnosis not present

## 2019-11-17 DIAGNOSIS — D509 Iron deficiency anemia, unspecified: Secondary | ICD-10-CM | POA: Diagnosis not present

## 2019-11-17 DIAGNOSIS — D688 Other specified coagulation defects: Secondary | ICD-10-CM | POA: Diagnosis not present

## 2019-11-17 DIAGNOSIS — N186 End stage renal disease: Secondary | ICD-10-CM | POA: Diagnosis not present

## 2019-11-17 DIAGNOSIS — N2581 Secondary hyperparathyroidism of renal origin: Secondary | ICD-10-CM | POA: Diagnosis not present

## 2019-11-20 DIAGNOSIS — N186 End stage renal disease: Secondary | ICD-10-CM | POA: Diagnosis not present

## 2019-11-20 DIAGNOSIS — D509 Iron deficiency anemia, unspecified: Secondary | ICD-10-CM | POA: Diagnosis not present

## 2019-11-20 DIAGNOSIS — Z992 Dependence on renal dialysis: Secondary | ICD-10-CM | POA: Diagnosis not present

## 2019-11-20 DIAGNOSIS — M310 Hypersensitivity angiitis: Secondary | ICD-10-CM | POA: Diagnosis not present

## 2019-11-20 DIAGNOSIS — D688 Other specified coagulation defects: Secondary | ICD-10-CM | POA: Diagnosis not present

## 2019-11-20 DIAGNOSIS — N2581 Secondary hyperparathyroidism of renal origin: Secondary | ICD-10-CM | POA: Diagnosis not present

## 2019-11-20 DIAGNOSIS — D631 Anemia in chronic kidney disease: Secondary | ICD-10-CM | POA: Diagnosis not present

## 2019-11-22 DIAGNOSIS — D631 Anemia in chronic kidney disease: Secondary | ICD-10-CM | POA: Diagnosis not present

## 2019-11-22 DIAGNOSIS — N186 End stage renal disease: Secondary | ICD-10-CM | POA: Diagnosis not present

## 2019-11-22 DIAGNOSIS — N2581 Secondary hyperparathyroidism of renal origin: Secondary | ICD-10-CM | POA: Diagnosis not present

## 2019-11-22 DIAGNOSIS — Z992 Dependence on renal dialysis: Secondary | ICD-10-CM | POA: Diagnosis not present

## 2019-11-22 DIAGNOSIS — M310 Hypersensitivity angiitis: Secondary | ICD-10-CM | POA: Diagnosis not present

## 2019-11-22 DIAGNOSIS — D688 Other specified coagulation defects: Secondary | ICD-10-CM | POA: Diagnosis not present

## 2019-11-22 DIAGNOSIS — D509 Iron deficiency anemia, unspecified: Secondary | ICD-10-CM | POA: Diagnosis not present

## 2019-11-24 DIAGNOSIS — M310 Hypersensitivity angiitis: Secondary | ICD-10-CM | POA: Diagnosis not present

## 2019-11-24 DIAGNOSIS — Z992 Dependence on renal dialysis: Secondary | ICD-10-CM | POA: Diagnosis not present

## 2019-11-24 DIAGNOSIS — D688 Other specified coagulation defects: Secondary | ICD-10-CM | POA: Diagnosis not present

## 2019-11-24 DIAGNOSIS — D631 Anemia in chronic kidney disease: Secondary | ICD-10-CM | POA: Diagnosis not present

## 2019-11-24 DIAGNOSIS — N2581 Secondary hyperparathyroidism of renal origin: Secondary | ICD-10-CM | POA: Diagnosis not present

## 2019-11-24 DIAGNOSIS — N186 End stage renal disease: Secondary | ICD-10-CM | POA: Diagnosis not present

## 2019-11-24 DIAGNOSIS — D509 Iron deficiency anemia, unspecified: Secondary | ICD-10-CM | POA: Diagnosis not present

## 2019-11-26 DIAGNOSIS — D688 Other specified coagulation defects: Secondary | ICD-10-CM | POA: Diagnosis not present

## 2019-11-26 DIAGNOSIS — N2581 Secondary hyperparathyroidism of renal origin: Secondary | ICD-10-CM | POA: Diagnosis not present

## 2019-11-26 DIAGNOSIS — D631 Anemia in chronic kidney disease: Secondary | ICD-10-CM | POA: Diagnosis not present

## 2019-11-26 DIAGNOSIS — Z992 Dependence on renal dialysis: Secondary | ICD-10-CM | POA: Diagnosis not present

## 2019-11-26 DIAGNOSIS — N186 End stage renal disease: Secondary | ICD-10-CM | POA: Diagnosis not present

## 2019-11-26 DIAGNOSIS — M310 Hypersensitivity angiitis: Secondary | ICD-10-CM | POA: Diagnosis not present

## 2019-11-26 DIAGNOSIS — D509 Iron deficiency anemia, unspecified: Secondary | ICD-10-CM | POA: Diagnosis not present

## 2019-11-28 DIAGNOSIS — Z7682 Awaiting organ transplant status: Secondary | ICD-10-CM | POA: Diagnosis not present

## 2019-11-28 DIAGNOSIS — D688 Other specified coagulation defects: Secondary | ICD-10-CM | POA: Diagnosis not present

## 2019-11-28 DIAGNOSIS — N186 End stage renal disease: Secondary | ICD-10-CM | POA: Diagnosis not present

## 2019-11-28 DIAGNOSIS — D631 Anemia in chronic kidney disease: Secondary | ICD-10-CM | POA: Diagnosis not present

## 2019-11-28 DIAGNOSIS — Z992 Dependence on renal dialysis: Secondary | ICD-10-CM | POA: Diagnosis not present

## 2019-11-28 DIAGNOSIS — D509 Iron deficiency anemia, unspecified: Secondary | ICD-10-CM | POA: Diagnosis not present

## 2019-11-28 DIAGNOSIS — N2581 Secondary hyperparathyroidism of renal origin: Secondary | ICD-10-CM | POA: Diagnosis not present

## 2019-11-28 DIAGNOSIS — M310 Hypersensitivity angiitis: Secondary | ICD-10-CM | POA: Diagnosis not present

## 2019-12-01 DIAGNOSIS — N186 End stage renal disease: Secondary | ICD-10-CM | POA: Diagnosis not present

## 2019-12-01 DIAGNOSIS — D509 Iron deficiency anemia, unspecified: Secondary | ICD-10-CM | POA: Diagnosis not present

## 2019-12-01 DIAGNOSIS — D631 Anemia in chronic kidney disease: Secondary | ICD-10-CM | POA: Diagnosis not present

## 2019-12-01 DIAGNOSIS — Z992 Dependence on renal dialysis: Secondary | ICD-10-CM | POA: Diagnosis not present

## 2019-12-01 DIAGNOSIS — M310 Hypersensitivity angiitis: Secondary | ICD-10-CM | POA: Diagnosis not present

## 2019-12-01 DIAGNOSIS — D688 Other specified coagulation defects: Secondary | ICD-10-CM | POA: Diagnosis not present

## 2019-12-01 DIAGNOSIS — N2581 Secondary hyperparathyroidism of renal origin: Secondary | ICD-10-CM | POA: Diagnosis not present

## 2019-12-04 DIAGNOSIS — N019 Rapidly progressive nephritic syndrome with unspecified morphologic changes: Secondary | ICD-10-CM | POA: Diagnosis not present

## 2019-12-04 DIAGNOSIS — D688 Other specified coagulation defects: Secondary | ICD-10-CM | POA: Diagnosis not present

## 2019-12-04 DIAGNOSIS — D631 Anemia in chronic kidney disease: Secondary | ICD-10-CM | POA: Diagnosis not present

## 2019-12-04 DIAGNOSIS — Z992 Dependence on renal dialysis: Secondary | ICD-10-CM | POA: Diagnosis not present

## 2019-12-04 DIAGNOSIS — D509 Iron deficiency anemia, unspecified: Secondary | ICD-10-CM | POA: Diagnosis not present

## 2019-12-04 DIAGNOSIS — M310 Hypersensitivity angiitis: Secondary | ICD-10-CM | POA: Diagnosis not present

## 2019-12-04 DIAGNOSIS — N186 End stage renal disease: Secondary | ICD-10-CM | POA: Diagnosis not present

## 2019-12-04 DIAGNOSIS — N2581 Secondary hyperparathyroidism of renal origin: Secondary | ICD-10-CM | POA: Diagnosis not present

## 2019-12-06 DIAGNOSIS — D631 Anemia in chronic kidney disease: Secondary | ICD-10-CM | POA: Diagnosis not present

## 2019-12-06 DIAGNOSIS — M310 Hypersensitivity angiitis: Secondary | ICD-10-CM | POA: Diagnosis not present

## 2019-12-06 DIAGNOSIS — Z992 Dependence on renal dialysis: Secondary | ICD-10-CM | POA: Diagnosis not present

## 2019-12-06 DIAGNOSIS — D688 Other specified coagulation defects: Secondary | ICD-10-CM | POA: Diagnosis not present

## 2019-12-06 DIAGNOSIS — D509 Iron deficiency anemia, unspecified: Secondary | ICD-10-CM | POA: Diagnosis not present

## 2019-12-06 DIAGNOSIS — N2581 Secondary hyperparathyroidism of renal origin: Secondary | ICD-10-CM | POA: Diagnosis not present

## 2019-12-06 DIAGNOSIS — N186 End stage renal disease: Secondary | ICD-10-CM | POA: Diagnosis not present

## 2019-12-08 DIAGNOSIS — N2581 Secondary hyperparathyroidism of renal origin: Secondary | ICD-10-CM | POA: Diagnosis not present

## 2019-12-08 DIAGNOSIS — Z992 Dependence on renal dialysis: Secondary | ICD-10-CM | POA: Diagnosis not present

## 2019-12-08 DIAGNOSIS — N186 End stage renal disease: Secondary | ICD-10-CM | POA: Diagnosis not present

## 2019-12-08 DIAGNOSIS — D688 Other specified coagulation defects: Secondary | ICD-10-CM | POA: Diagnosis not present

## 2019-12-08 DIAGNOSIS — D631 Anemia in chronic kidney disease: Secondary | ICD-10-CM | POA: Diagnosis not present

## 2019-12-08 DIAGNOSIS — D509 Iron deficiency anemia, unspecified: Secondary | ICD-10-CM | POA: Diagnosis not present

## 2019-12-08 DIAGNOSIS — M310 Hypersensitivity angiitis: Secondary | ICD-10-CM | POA: Diagnosis not present

## 2019-12-10 DIAGNOSIS — Z992 Dependence on renal dialysis: Secondary | ICD-10-CM | POA: Diagnosis not present

## 2019-12-10 DIAGNOSIS — E039 Hypothyroidism, unspecified: Secondary | ICD-10-CM | POA: Diagnosis not present

## 2019-12-10 DIAGNOSIS — Z79899 Other long term (current) drug therapy: Secondary | ICD-10-CM | POA: Diagnosis not present

## 2019-12-10 DIAGNOSIS — I700 Atherosclerosis of aorta: Secondary | ICD-10-CM | POA: Diagnosis not present

## 2019-12-10 DIAGNOSIS — M810 Age-related osteoporosis without current pathological fracture: Secondary | ICD-10-CM | POA: Diagnosis not present

## 2019-12-10 DIAGNOSIS — R69 Illness, unspecified: Secondary | ICD-10-CM | POA: Diagnosis not present

## 2019-12-10 DIAGNOSIS — Z0000 Encounter for general adult medical examination without abnormal findings: Secondary | ICD-10-CM | POA: Diagnosis not present

## 2019-12-10 DIAGNOSIS — I129 Hypertensive chronic kidney disease with stage 1 through stage 4 chronic kidney disease, or unspecified chronic kidney disease: Secondary | ICD-10-CM | POA: Diagnosis not present

## 2019-12-10 DIAGNOSIS — E7800 Pure hypercholesterolemia, unspecified: Secondary | ICD-10-CM | POA: Diagnosis not present

## 2019-12-10 DIAGNOSIS — N186 End stage renal disease: Secondary | ICD-10-CM | POA: Diagnosis not present

## 2019-12-10 DIAGNOSIS — Z1389 Encounter for screening for other disorder: Secondary | ICD-10-CM | POA: Diagnosis not present

## 2019-12-11 DIAGNOSIS — N2581 Secondary hyperparathyroidism of renal origin: Secondary | ICD-10-CM | POA: Diagnosis not present

## 2019-12-11 DIAGNOSIS — N186 End stage renal disease: Secondary | ICD-10-CM | POA: Diagnosis not present

## 2019-12-11 DIAGNOSIS — D631 Anemia in chronic kidney disease: Secondary | ICD-10-CM | POA: Diagnosis not present

## 2019-12-11 DIAGNOSIS — D509 Iron deficiency anemia, unspecified: Secondary | ICD-10-CM | POA: Diagnosis not present

## 2019-12-11 DIAGNOSIS — D688 Other specified coagulation defects: Secondary | ICD-10-CM | POA: Diagnosis not present

## 2019-12-11 DIAGNOSIS — Z992 Dependence on renal dialysis: Secondary | ICD-10-CM | POA: Diagnosis not present

## 2019-12-11 DIAGNOSIS — M310 Hypersensitivity angiitis: Secondary | ICD-10-CM | POA: Diagnosis not present

## 2019-12-13 DIAGNOSIS — N186 End stage renal disease: Secondary | ICD-10-CM | POA: Diagnosis not present

## 2019-12-13 DIAGNOSIS — D509 Iron deficiency anemia, unspecified: Secondary | ICD-10-CM | POA: Diagnosis not present

## 2019-12-13 DIAGNOSIS — D631 Anemia in chronic kidney disease: Secondary | ICD-10-CM | POA: Diagnosis not present

## 2019-12-13 DIAGNOSIS — D688 Other specified coagulation defects: Secondary | ICD-10-CM | POA: Diagnosis not present

## 2019-12-13 DIAGNOSIS — Z992 Dependence on renal dialysis: Secondary | ICD-10-CM | POA: Diagnosis not present

## 2019-12-13 DIAGNOSIS — M310 Hypersensitivity angiitis: Secondary | ICD-10-CM | POA: Diagnosis not present

## 2019-12-13 DIAGNOSIS — N2581 Secondary hyperparathyroidism of renal origin: Secondary | ICD-10-CM | POA: Diagnosis not present

## 2019-12-15 DIAGNOSIS — D509 Iron deficiency anemia, unspecified: Secondary | ICD-10-CM | POA: Diagnosis not present

## 2019-12-15 DIAGNOSIS — M310 Hypersensitivity angiitis: Secondary | ICD-10-CM | POA: Diagnosis not present

## 2019-12-15 DIAGNOSIS — D688 Other specified coagulation defects: Secondary | ICD-10-CM | POA: Diagnosis not present

## 2019-12-15 DIAGNOSIS — D631 Anemia in chronic kidney disease: Secondary | ICD-10-CM | POA: Diagnosis not present

## 2019-12-15 DIAGNOSIS — Z992 Dependence on renal dialysis: Secondary | ICD-10-CM | POA: Diagnosis not present

## 2019-12-15 DIAGNOSIS — N2581 Secondary hyperparathyroidism of renal origin: Secondary | ICD-10-CM | POA: Diagnosis not present

## 2019-12-15 DIAGNOSIS — N186 End stage renal disease: Secondary | ICD-10-CM | POA: Diagnosis not present

## 2019-12-18 DIAGNOSIS — N186 End stage renal disease: Secondary | ICD-10-CM | POA: Diagnosis not present

## 2019-12-18 DIAGNOSIS — D688 Other specified coagulation defects: Secondary | ICD-10-CM | POA: Diagnosis not present

## 2019-12-18 DIAGNOSIS — D509 Iron deficiency anemia, unspecified: Secondary | ICD-10-CM | POA: Diagnosis not present

## 2019-12-18 DIAGNOSIS — Z992 Dependence on renal dialysis: Secondary | ICD-10-CM | POA: Diagnosis not present

## 2019-12-18 DIAGNOSIS — N2581 Secondary hyperparathyroidism of renal origin: Secondary | ICD-10-CM | POA: Diagnosis not present

## 2019-12-18 DIAGNOSIS — D631 Anemia in chronic kidney disease: Secondary | ICD-10-CM | POA: Diagnosis not present

## 2019-12-18 DIAGNOSIS — M310 Hypersensitivity angiitis: Secondary | ICD-10-CM | POA: Diagnosis not present

## 2019-12-20 DIAGNOSIS — Z992 Dependence on renal dialysis: Secondary | ICD-10-CM | POA: Diagnosis not present

## 2019-12-20 DIAGNOSIS — D509 Iron deficiency anemia, unspecified: Secondary | ICD-10-CM | POA: Diagnosis not present

## 2019-12-20 DIAGNOSIS — D688 Other specified coagulation defects: Secondary | ICD-10-CM | POA: Diagnosis not present

## 2019-12-20 DIAGNOSIS — D631 Anemia in chronic kidney disease: Secondary | ICD-10-CM | POA: Diagnosis not present

## 2019-12-20 DIAGNOSIS — N186 End stage renal disease: Secondary | ICD-10-CM | POA: Diagnosis not present

## 2019-12-20 DIAGNOSIS — M310 Hypersensitivity angiitis: Secondary | ICD-10-CM | POA: Diagnosis not present

## 2019-12-20 DIAGNOSIS — N2581 Secondary hyperparathyroidism of renal origin: Secondary | ICD-10-CM | POA: Diagnosis not present

## 2019-12-22 DIAGNOSIS — D631 Anemia in chronic kidney disease: Secondary | ICD-10-CM | POA: Diagnosis not present

## 2019-12-22 DIAGNOSIS — D688 Other specified coagulation defects: Secondary | ICD-10-CM | POA: Diagnosis not present

## 2019-12-22 DIAGNOSIS — M310 Hypersensitivity angiitis: Secondary | ICD-10-CM | POA: Diagnosis not present

## 2019-12-22 DIAGNOSIS — Z992 Dependence on renal dialysis: Secondary | ICD-10-CM | POA: Diagnosis not present

## 2019-12-22 DIAGNOSIS — N186 End stage renal disease: Secondary | ICD-10-CM | POA: Diagnosis not present

## 2019-12-22 DIAGNOSIS — N2581 Secondary hyperparathyroidism of renal origin: Secondary | ICD-10-CM | POA: Diagnosis not present

## 2019-12-22 DIAGNOSIS — D509 Iron deficiency anemia, unspecified: Secondary | ICD-10-CM | POA: Diagnosis not present

## 2019-12-24 DIAGNOSIS — Z961 Presence of intraocular lens: Secondary | ICD-10-CM | POA: Diagnosis not present

## 2019-12-24 DIAGNOSIS — H35372 Puckering of macula, left eye: Secondary | ICD-10-CM | POA: Diagnosis not present

## 2019-12-24 DIAGNOSIS — H16223 Keratoconjunctivitis sicca, not specified as Sjogren's, bilateral: Secondary | ICD-10-CM | POA: Diagnosis not present

## 2019-12-24 DIAGNOSIS — G510 Bell's palsy: Secondary | ICD-10-CM | POA: Diagnosis not present

## 2019-12-24 DIAGNOSIS — H401112 Primary open-angle glaucoma, right eye, moderate stage: Secondary | ICD-10-CM | POA: Diagnosis not present

## 2019-12-24 DIAGNOSIS — D3132 Benign neoplasm of left choroid: Secondary | ICD-10-CM | POA: Diagnosis not present

## 2019-12-24 DIAGNOSIS — H401121 Primary open-angle glaucoma, left eye, mild stage: Secondary | ICD-10-CM | POA: Diagnosis not present

## 2019-12-24 DIAGNOSIS — H43811 Vitreous degeneration, right eye: Secondary | ICD-10-CM | POA: Diagnosis not present

## 2019-12-25 DIAGNOSIS — Z992 Dependence on renal dialysis: Secondary | ICD-10-CM | POA: Diagnosis not present

## 2019-12-25 DIAGNOSIS — M310 Hypersensitivity angiitis: Secondary | ICD-10-CM | POA: Diagnosis not present

## 2019-12-25 DIAGNOSIS — N186 End stage renal disease: Secondary | ICD-10-CM | POA: Diagnosis not present

## 2019-12-25 DIAGNOSIS — N2581 Secondary hyperparathyroidism of renal origin: Secondary | ICD-10-CM | POA: Diagnosis not present

## 2019-12-25 DIAGNOSIS — D688 Other specified coagulation defects: Secondary | ICD-10-CM | POA: Diagnosis not present

## 2019-12-25 DIAGNOSIS — D631 Anemia in chronic kidney disease: Secondary | ICD-10-CM | POA: Diagnosis not present

## 2019-12-25 DIAGNOSIS — D509 Iron deficiency anemia, unspecified: Secondary | ICD-10-CM | POA: Diagnosis not present

## 2019-12-27 DIAGNOSIS — D631 Anemia in chronic kidney disease: Secondary | ICD-10-CM | POA: Diagnosis not present

## 2019-12-27 DIAGNOSIS — N2581 Secondary hyperparathyroidism of renal origin: Secondary | ICD-10-CM | POA: Diagnosis not present

## 2019-12-27 DIAGNOSIS — D509 Iron deficiency anemia, unspecified: Secondary | ICD-10-CM | POA: Diagnosis not present

## 2019-12-27 DIAGNOSIS — M310 Hypersensitivity angiitis: Secondary | ICD-10-CM | POA: Diagnosis not present

## 2019-12-27 DIAGNOSIS — N186 End stage renal disease: Secondary | ICD-10-CM | POA: Diagnosis not present

## 2019-12-27 DIAGNOSIS — Z992 Dependence on renal dialysis: Secondary | ICD-10-CM | POA: Diagnosis not present

## 2019-12-27 DIAGNOSIS — D688 Other specified coagulation defects: Secondary | ICD-10-CM | POA: Diagnosis not present

## 2019-12-30 DIAGNOSIS — Z992 Dependence on renal dialysis: Secondary | ICD-10-CM | POA: Diagnosis not present

## 2019-12-30 DIAGNOSIS — N2581 Secondary hyperparathyroidism of renal origin: Secondary | ICD-10-CM | POA: Diagnosis not present

## 2019-12-30 DIAGNOSIS — N186 End stage renal disease: Secondary | ICD-10-CM | POA: Diagnosis not present

## 2019-12-30 DIAGNOSIS — D509 Iron deficiency anemia, unspecified: Secondary | ICD-10-CM | POA: Diagnosis not present

## 2019-12-30 DIAGNOSIS — D688 Other specified coagulation defects: Secondary | ICD-10-CM | POA: Diagnosis not present

## 2019-12-30 DIAGNOSIS — D631 Anemia in chronic kidney disease: Secondary | ICD-10-CM | POA: Diagnosis not present

## 2019-12-30 DIAGNOSIS — M310 Hypersensitivity angiitis: Secondary | ICD-10-CM | POA: Diagnosis not present

## 2020-01-01 DIAGNOSIS — D631 Anemia in chronic kidney disease: Secondary | ICD-10-CM | POA: Diagnosis not present

## 2020-01-01 DIAGNOSIS — N186 End stage renal disease: Secondary | ICD-10-CM | POA: Diagnosis not present

## 2020-01-01 DIAGNOSIS — D509 Iron deficiency anemia, unspecified: Secondary | ICD-10-CM | POA: Diagnosis not present

## 2020-01-01 DIAGNOSIS — N2581 Secondary hyperparathyroidism of renal origin: Secondary | ICD-10-CM | POA: Diagnosis not present

## 2020-01-01 DIAGNOSIS — D688 Other specified coagulation defects: Secondary | ICD-10-CM | POA: Diagnosis not present

## 2020-01-01 DIAGNOSIS — Z992 Dependence on renal dialysis: Secondary | ICD-10-CM | POA: Diagnosis not present

## 2020-01-01 DIAGNOSIS — M310 Hypersensitivity angiitis: Secondary | ICD-10-CM | POA: Diagnosis not present

## 2020-01-03 DIAGNOSIS — N186 End stage renal disease: Secondary | ICD-10-CM | POA: Diagnosis not present

## 2020-01-03 DIAGNOSIS — M310 Hypersensitivity angiitis: Secondary | ICD-10-CM | POA: Diagnosis not present

## 2020-01-03 DIAGNOSIS — Z992 Dependence on renal dialysis: Secondary | ICD-10-CM | POA: Diagnosis not present

## 2020-01-03 DIAGNOSIS — D631 Anemia in chronic kidney disease: Secondary | ICD-10-CM | POA: Diagnosis not present

## 2020-01-03 DIAGNOSIS — D509 Iron deficiency anemia, unspecified: Secondary | ICD-10-CM | POA: Diagnosis not present

## 2020-01-03 DIAGNOSIS — D688 Other specified coagulation defects: Secondary | ICD-10-CM | POA: Diagnosis not present

## 2020-01-03 DIAGNOSIS — N2581 Secondary hyperparathyroidism of renal origin: Secondary | ICD-10-CM | POA: Diagnosis not present

## 2020-01-04 DIAGNOSIS — N186 End stage renal disease: Secondary | ICD-10-CM | POA: Diagnosis not present

## 2020-01-04 DIAGNOSIS — Z992 Dependence on renal dialysis: Secondary | ICD-10-CM | POA: Diagnosis not present

## 2020-01-04 DIAGNOSIS — N019 Rapidly progressive nephritic syndrome with unspecified morphologic changes: Secondary | ICD-10-CM | POA: Diagnosis not present

## 2020-01-06 DIAGNOSIS — M310 Hypersensitivity angiitis: Secondary | ICD-10-CM | POA: Diagnosis not present

## 2020-01-06 DIAGNOSIS — D509 Iron deficiency anemia, unspecified: Secondary | ICD-10-CM | POA: Diagnosis not present

## 2020-01-06 DIAGNOSIS — N186 End stage renal disease: Secondary | ICD-10-CM | POA: Diagnosis not present

## 2020-01-06 DIAGNOSIS — Z992 Dependence on renal dialysis: Secondary | ICD-10-CM | POA: Diagnosis not present

## 2020-01-06 DIAGNOSIS — D688 Other specified coagulation defects: Secondary | ICD-10-CM | POA: Diagnosis not present

## 2020-01-06 DIAGNOSIS — N2581 Secondary hyperparathyroidism of renal origin: Secondary | ICD-10-CM | POA: Diagnosis not present

## 2020-01-06 DIAGNOSIS — D631 Anemia in chronic kidney disease: Secondary | ICD-10-CM | POA: Diagnosis not present

## 2020-01-08 DIAGNOSIS — M5441 Lumbago with sciatica, right side: Secondary | ICD-10-CM | POA: Diagnosis not present

## 2020-01-08 DIAGNOSIS — Z992 Dependence on renal dialysis: Secondary | ICD-10-CM | POA: Diagnosis not present

## 2020-01-08 DIAGNOSIS — N186 End stage renal disease: Secondary | ICD-10-CM | POA: Diagnosis not present

## 2020-01-08 DIAGNOSIS — D688 Other specified coagulation defects: Secondary | ICD-10-CM | POA: Diagnosis not present

## 2020-01-08 DIAGNOSIS — M310 Hypersensitivity angiitis: Secondary | ICD-10-CM | POA: Diagnosis not present

## 2020-01-08 DIAGNOSIS — D509 Iron deficiency anemia, unspecified: Secondary | ICD-10-CM | POA: Diagnosis not present

## 2020-01-08 DIAGNOSIS — D631 Anemia in chronic kidney disease: Secondary | ICD-10-CM | POA: Diagnosis not present

## 2020-01-08 DIAGNOSIS — M9902 Segmental and somatic dysfunction of thoracic region: Secondary | ICD-10-CM | POA: Diagnosis not present

## 2020-01-08 DIAGNOSIS — M62830 Muscle spasm of back: Secondary | ICD-10-CM | POA: Diagnosis not present

## 2020-01-08 DIAGNOSIS — M9901 Segmental and somatic dysfunction of cervical region: Secondary | ICD-10-CM | POA: Diagnosis not present

## 2020-01-08 DIAGNOSIS — M9903 Segmental and somatic dysfunction of lumbar region: Secondary | ICD-10-CM | POA: Diagnosis not present

## 2020-01-08 DIAGNOSIS — M5412 Radiculopathy, cervical region: Secondary | ICD-10-CM | POA: Diagnosis not present

## 2020-01-08 DIAGNOSIS — N2581 Secondary hyperparathyroidism of renal origin: Secondary | ICD-10-CM | POA: Diagnosis not present

## 2020-01-09 DIAGNOSIS — M62830 Muscle spasm of back: Secondary | ICD-10-CM | POA: Diagnosis not present

## 2020-01-09 DIAGNOSIS — M9901 Segmental and somatic dysfunction of cervical region: Secondary | ICD-10-CM | POA: Diagnosis not present

## 2020-01-09 DIAGNOSIS — M9902 Segmental and somatic dysfunction of thoracic region: Secondary | ICD-10-CM | POA: Diagnosis not present

## 2020-01-09 DIAGNOSIS — M5441 Lumbago with sciatica, right side: Secondary | ICD-10-CM | POA: Diagnosis not present

## 2020-01-09 DIAGNOSIS — M9903 Segmental and somatic dysfunction of lumbar region: Secondary | ICD-10-CM | POA: Diagnosis not present

## 2020-01-09 DIAGNOSIS — M5412 Radiculopathy, cervical region: Secondary | ICD-10-CM | POA: Diagnosis not present

## 2020-01-10 DIAGNOSIS — D631 Anemia in chronic kidney disease: Secondary | ICD-10-CM | POA: Diagnosis not present

## 2020-01-10 DIAGNOSIS — N2581 Secondary hyperparathyroidism of renal origin: Secondary | ICD-10-CM | POA: Diagnosis not present

## 2020-01-10 DIAGNOSIS — Z992 Dependence on renal dialysis: Secondary | ICD-10-CM | POA: Diagnosis not present

## 2020-01-10 DIAGNOSIS — D509 Iron deficiency anemia, unspecified: Secondary | ICD-10-CM | POA: Diagnosis not present

## 2020-01-10 DIAGNOSIS — N186 End stage renal disease: Secondary | ICD-10-CM | POA: Diagnosis not present

## 2020-01-10 DIAGNOSIS — D688 Other specified coagulation defects: Secondary | ICD-10-CM | POA: Diagnosis not present

## 2020-01-10 DIAGNOSIS — M310 Hypersensitivity angiitis: Secondary | ICD-10-CM | POA: Diagnosis not present

## 2020-01-12 DIAGNOSIS — N186 End stage renal disease: Secondary | ICD-10-CM | POA: Diagnosis not present

## 2020-01-12 DIAGNOSIS — M310 Hypersensitivity angiitis: Secondary | ICD-10-CM | POA: Diagnosis not present

## 2020-01-12 DIAGNOSIS — D688 Other specified coagulation defects: Secondary | ICD-10-CM | POA: Diagnosis not present

## 2020-01-12 DIAGNOSIS — N2581 Secondary hyperparathyroidism of renal origin: Secondary | ICD-10-CM | POA: Diagnosis not present

## 2020-01-12 DIAGNOSIS — Z992 Dependence on renal dialysis: Secondary | ICD-10-CM | POA: Diagnosis not present

## 2020-01-12 DIAGNOSIS — D509 Iron deficiency anemia, unspecified: Secondary | ICD-10-CM | POA: Diagnosis not present

## 2020-01-12 DIAGNOSIS — D631 Anemia in chronic kidney disease: Secondary | ICD-10-CM | POA: Diagnosis not present

## 2020-01-14 DIAGNOSIS — M9902 Segmental and somatic dysfunction of thoracic region: Secondary | ICD-10-CM | POA: Diagnosis not present

## 2020-01-14 DIAGNOSIS — M9901 Segmental and somatic dysfunction of cervical region: Secondary | ICD-10-CM | POA: Diagnosis not present

## 2020-01-14 DIAGNOSIS — M9903 Segmental and somatic dysfunction of lumbar region: Secondary | ICD-10-CM | POA: Diagnosis not present

## 2020-01-14 DIAGNOSIS — M5441 Lumbago with sciatica, right side: Secondary | ICD-10-CM | POA: Diagnosis not present

## 2020-01-14 DIAGNOSIS — M5412 Radiculopathy, cervical region: Secondary | ICD-10-CM | POA: Diagnosis not present

## 2020-01-14 DIAGNOSIS — M62830 Muscle spasm of back: Secondary | ICD-10-CM | POA: Diagnosis not present

## 2020-01-15 DIAGNOSIS — D509 Iron deficiency anemia, unspecified: Secondary | ICD-10-CM | POA: Diagnosis not present

## 2020-01-15 DIAGNOSIS — Z992 Dependence on renal dialysis: Secondary | ICD-10-CM | POA: Diagnosis not present

## 2020-01-15 DIAGNOSIS — N2581 Secondary hyperparathyroidism of renal origin: Secondary | ICD-10-CM | POA: Diagnosis not present

## 2020-01-15 DIAGNOSIS — D631 Anemia in chronic kidney disease: Secondary | ICD-10-CM | POA: Diagnosis not present

## 2020-01-15 DIAGNOSIS — N186 End stage renal disease: Secondary | ICD-10-CM | POA: Diagnosis not present

## 2020-01-15 DIAGNOSIS — D688 Other specified coagulation defects: Secondary | ICD-10-CM | POA: Diagnosis not present

## 2020-01-15 DIAGNOSIS — M310 Hypersensitivity angiitis: Secondary | ICD-10-CM | POA: Diagnosis not present

## 2020-01-17 DIAGNOSIS — D631 Anemia in chronic kidney disease: Secondary | ICD-10-CM | POA: Diagnosis not present

## 2020-01-17 DIAGNOSIS — N2581 Secondary hyperparathyroidism of renal origin: Secondary | ICD-10-CM | POA: Diagnosis not present

## 2020-01-17 DIAGNOSIS — N186 End stage renal disease: Secondary | ICD-10-CM | POA: Diagnosis not present

## 2020-01-17 DIAGNOSIS — Z992 Dependence on renal dialysis: Secondary | ICD-10-CM | POA: Diagnosis not present

## 2020-01-17 DIAGNOSIS — D688 Other specified coagulation defects: Secondary | ICD-10-CM | POA: Diagnosis not present

## 2020-01-17 DIAGNOSIS — D509 Iron deficiency anemia, unspecified: Secondary | ICD-10-CM | POA: Diagnosis not present

## 2020-01-17 DIAGNOSIS — M310 Hypersensitivity angiitis: Secondary | ICD-10-CM | POA: Diagnosis not present

## 2020-01-19 DIAGNOSIS — D688 Other specified coagulation defects: Secondary | ICD-10-CM | POA: Diagnosis not present

## 2020-01-19 DIAGNOSIS — D509 Iron deficiency anemia, unspecified: Secondary | ICD-10-CM | POA: Diagnosis not present

## 2020-01-19 DIAGNOSIS — M310 Hypersensitivity angiitis: Secondary | ICD-10-CM | POA: Diagnosis not present

## 2020-01-19 DIAGNOSIS — Z992 Dependence on renal dialysis: Secondary | ICD-10-CM | POA: Diagnosis not present

## 2020-01-19 DIAGNOSIS — N2581 Secondary hyperparathyroidism of renal origin: Secondary | ICD-10-CM | POA: Diagnosis not present

## 2020-01-19 DIAGNOSIS — D631 Anemia in chronic kidney disease: Secondary | ICD-10-CM | POA: Diagnosis not present

## 2020-01-19 DIAGNOSIS — N186 End stage renal disease: Secondary | ICD-10-CM | POA: Diagnosis not present

## 2020-01-22 DIAGNOSIS — M310 Hypersensitivity angiitis: Secondary | ICD-10-CM | POA: Diagnosis not present

## 2020-01-22 DIAGNOSIS — M62830 Muscle spasm of back: Secondary | ICD-10-CM | POA: Diagnosis not present

## 2020-01-22 DIAGNOSIS — M9903 Segmental and somatic dysfunction of lumbar region: Secondary | ICD-10-CM | POA: Diagnosis not present

## 2020-01-22 DIAGNOSIS — D688 Other specified coagulation defects: Secondary | ICD-10-CM | POA: Diagnosis not present

## 2020-01-22 DIAGNOSIS — M5441 Lumbago with sciatica, right side: Secondary | ICD-10-CM | POA: Diagnosis not present

## 2020-01-22 DIAGNOSIS — M9901 Segmental and somatic dysfunction of cervical region: Secondary | ICD-10-CM | POA: Diagnosis not present

## 2020-01-22 DIAGNOSIS — N186 End stage renal disease: Secondary | ICD-10-CM | POA: Diagnosis not present

## 2020-01-22 DIAGNOSIS — Z992 Dependence on renal dialysis: Secondary | ICD-10-CM | POA: Diagnosis not present

## 2020-01-22 DIAGNOSIS — D509 Iron deficiency anemia, unspecified: Secondary | ICD-10-CM | POA: Diagnosis not present

## 2020-01-22 DIAGNOSIS — D631 Anemia in chronic kidney disease: Secondary | ICD-10-CM | POA: Diagnosis not present

## 2020-01-22 DIAGNOSIS — N2581 Secondary hyperparathyroidism of renal origin: Secondary | ICD-10-CM | POA: Diagnosis not present

## 2020-01-22 DIAGNOSIS — M9902 Segmental and somatic dysfunction of thoracic region: Secondary | ICD-10-CM | POA: Diagnosis not present

## 2020-01-22 DIAGNOSIS — M5412 Radiculopathy, cervical region: Secondary | ICD-10-CM | POA: Diagnosis not present

## 2020-01-24 DIAGNOSIS — N186 End stage renal disease: Secondary | ICD-10-CM | POA: Diagnosis not present

## 2020-01-24 DIAGNOSIS — N2581 Secondary hyperparathyroidism of renal origin: Secondary | ICD-10-CM | POA: Diagnosis not present

## 2020-01-24 DIAGNOSIS — D509 Iron deficiency anemia, unspecified: Secondary | ICD-10-CM | POA: Diagnosis not present

## 2020-01-24 DIAGNOSIS — D688 Other specified coagulation defects: Secondary | ICD-10-CM | POA: Diagnosis not present

## 2020-01-24 DIAGNOSIS — M310 Hypersensitivity angiitis: Secondary | ICD-10-CM | POA: Diagnosis not present

## 2020-01-24 DIAGNOSIS — Z992 Dependence on renal dialysis: Secondary | ICD-10-CM | POA: Diagnosis not present

## 2020-01-24 DIAGNOSIS — D631 Anemia in chronic kidney disease: Secondary | ICD-10-CM | POA: Diagnosis not present

## 2020-01-26 DIAGNOSIS — D688 Other specified coagulation defects: Secondary | ICD-10-CM | POA: Diagnosis not present

## 2020-01-26 DIAGNOSIS — N2581 Secondary hyperparathyroidism of renal origin: Secondary | ICD-10-CM | POA: Diagnosis not present

## 2020-01-26 DIAGNOSIS — N186 End stage renal disease: Secondary | ICD-10-CM | POA: Diagnosis not present

## 2020-01-26 DIAGNOSIS — M310 Hypersensitivity angiitis: Secondary | ICD-10-CM | POA: Diagnosis not present

## 2020-01-26 DIAGNOSIS — D631 Anemia in chronic kidney disease: Secondary | ICD-10-CM | POA: Diagnosis not present

## 2020-01-26 DIAGNOSIS — Z992 Dependence on renal dialysis: Secondary | ICD-10-CM | POA: Diagnosis not present

## 2020-01-26 DIAGNOSIS — D509 Iron deficiency anemia, unspecified: Secondary | ICD-10-CM | POA: Diagnosis not present

## 2020-01-29 DIAGNOSIS — M62830 Muscle spasm of back: Secondary | ICD-10-CM | POA: Diagnosis not present

## 2020-01-29 DIAGNOSIS — M9902 Segmental and somatic dysfunction of thoracic region: Secondary | ICD-10-CM | POA: Diagnosis not present

## 2020-01-29 DIAGNOSIS — M9903 Segmental and somatic dysfunction of lumbar region: Secondary | ICD-10-CM | POA: Diagnosis not present

## 2020-01-29 DIAGNOSIS — M5412 Radiculopathy, cervical region: Secondary | ICD-10-CM | POA: Diagnosis not present

## 2020-01-29 DIAGNOSIS — D688 Other specified coagulation defects: Secondary | ICD-10-CM | POA: Diagnosis not present

## 2020-01-29 DIAGNOSIS — D631 Anemia in chronic kidney disease: Secondary | ICD-10-CM | POA: Diagnosis not present

## 2020-01-29 DIAGNOSIS — N186 End stage renal disease: Secondary | ICD-10-CM | POA: Diagnosis not present

## 2020-01-29 DIAGNOSIS — D509 Iron deficiency anemia, unspecified: Secondary | ICD-10-CM | POA: Diagnosis not present

## 2020-01-29 DIAGNOSIS — N2581 Secondary hyperparathyroidism of renal origin: Secondary | ICD-10-CM | POA: Diagnosis not present

## 2020-01-29 DIAGNOSIS — M9901 Segmental and somatic dysfunction of cervical region: Secondary | ICD-10-CM | POA: Diagnosis not present

## 2020-01-29 DIAGNOSIS — M5441 Lumbago with sciatica, right side: Secondary | ICD-10-CM | POA: Diagnosis not present

## 2020-01-29 DIAGNOSIS — Z992 Dependence on renal dialysis: Secondary | ICD-10-CM | POA: Diagnosis not present

## 2020-01-29 DIAGNOSIS — M310 Hypersensitivity angiitis: Secondary | ICD-10-CM | POA: Diagnosis not present

## 2020-01-30 DIAGNOSIS — Z1231 Encounter for screening mammogram for malignant neoplasm of breast: Secondary | ICD-10-CM | POA: Diagnosis not present

## 2020-01-31 DIAGNOSIS — M310 Hypersensitivity angiitis: Secondary | ICD-10-CM | POA: Diagnosis not present

## 2020-01-31 DIAGNOSIS — D509 Iron deficiency anemia, unspecified: Secondary | ICD-10-CM | POA: Diagnosis not present

## 2020-01-31 DIAGNOSIS — Z992 Dependence on renal dialysis: Secondary | ICD-10-CM | POA: Diagnosis not present

## 2020-01-31 DIAGNOSIS — D631 Anemia in chronic kidney disease: Secondary | ICD-10-CM | POA: Diagnosis not present

## 2020-01-31 DIAGNOSIS — D688 Other specified coagulation defects: Secondary | ICD-10-CM | POA: Diagnosis not present

## 2020-01-31 DIAGNOSIS — N186 End stage renal disease: Secondary | ICD-10-CM | POA: Diagnosis not present

## 2020-01-31 DIAGNOSIS — N2581 Secondary hyperparathyroidism of renal origin: Secondary | ICD-10-CM | POA: Diagnosis not present

## 2020-02-02 DIAGNOSIS — D631 Anemia in chronic kidney disease: Secondary | ICD-10-CM | POA: Diagnosis not present

## 2020-02-02 DIAGNOSIS — D509 Iron deficiency anemia, unspecified: Secondary | ICD-10-CM | POA: Diagnosis not present

## 2020-02-02 DIAGNOSIS — N2581 Secondary hyperparathyroidism of renal origin: Secondary | ICD-10-CM | POA: Diagnosis not present

## 2020-02-02 DIAGNOSIS — Z992 Dependence on renal dialysis: Secondary | ICD-10-CM | POA: Diagnosis not present

## 2020-02-02 DIAGNOSIS — N186 End stage renal disease: Secondary | ICD-10-CM | POA: Diagnosis not present

## 2020-02-02 DIAGNOSIS — M310 Hypersensitivity angiitis: Secondary | ICD-10-CM | POA: Diagnosis not present

## 2020-02-02 DIAGNOSIS — D688 Other specified coagulation defects: Secondary | ICD-10-CM | POA: Diagnosis not present

## 2020-02-04 DIAGNOSIS — Z992 Dependence on renal dialysis: Secondary | ICD-10-CM | POA: Diagnosis not present

## 2020-02-04 DIAGNOSIS — N019 Rapidly progressive nephritic syndrome with unspecified morphologic changes: Secondary | ICD-10-CM | POA: Diagnosis not present

## 2020-02-04 DIAGNOSIS — N186 End stage renal disease: Secondary | ICD-10-CM | POA: Diagnosis not present

## 2020-02-05 DIAGNOSIS — Z992 Dependence on renal dialysis: Secondary | ICD-10-CM | POA: Diagnosis not present

## 2020-02-05 DIAGNOSIS — L739 Follicular disorder, unspecified: Secondary | ICD-10-CM | POA: Diagnosis not present

## 2020-02-05 DIAGNOSIS — H401112 Primary open-angle glaucoma, right eye, moderate stage: Secondary | ICD-10-CM | POA: Diagnosis not present

## 2020-02-05 DIAGNOSIS — D631 Anemia in chronic kidney disease: Secondary | ICD-10-CM | POA: Diagnosis not present

## 2020-02-05 DIAGNOSIS — N186 End stage renal disease: Secondary | ICD-10-CM | POA: Diagnosis not present

## 2020-02-05 DIAGNOSIS — D3132 Benign neoplasm of left choroid: Secondary | ICD-10-CM | POA: Diagnosis not present

## 2020-02-05 DIAGNOSIS — Z961 Presence of intraocular lens: Secondary | ICD-10-CM | POA: Diagnosis not present

## 2020-02-05 DIAGNOSIS — D509 Iron deficiency anemia, unspecified: Secondary | ICD-10-CM | POA: Diagnosis not present

## 2020-02-05 DIAGNOSIS — N2581 Secondary hyperparathyroidism of renal origin: Secondary | ICD-10-CM | POA: Diagnosis not present

## 2020-02-05 DIAGNOSIS — H35372 Puckering of macula, left eye: Secondary | ICD-10-CM | POA: Diagnosis not present

## 2020-02-05 DIAGNOSIS — D688 Other specified coagulation defects: Secondary | ICD-10-CM | POA: Diagnosis not present

## 2020-02-05 DIAGNOSIS — G510 Bell's palsy: Secondary | ICD-10-CM | POA: Diagnosis not present

## 2020-02-05 DIAGNOSIS — I129 Hypertensive chronic kidney disease with stage 1 through stage 4 chronic kidney disease, or unspecified chronic kidney disease: Secondary | ICD-10-CM | POA: Diagnosis not present

## 2020-02-05 DIAGNOSIS — H16223 Keratoconjunctivitis sicca, not specified as Sjogren's, bilateral: Secondary | ICD-10-CM | POA: Diagnosis not present

## 2020-02-05 DIAGNOSIS — H43813 Vitreous degeneration, bilateral: Secondary | ICD-10-CM | POA: Diagnosis not present

## 2020-02-05 DIAGNOSIS — H401121 Primary open-angle glaucoma, left eye, mild stage: Secondary | ICD-10-CM | POA: Diagnosis not present

## 2020-02-07 DIAGNOSIS — N2581 Secondary hyperparathyroidism of renal origin: Secondary | ICD-10-CM | POA: Diagnosis not present

## 2020-02-07 DIAGNOSIS — D509 Iron deficiency anemia, unspecified: Secondary | ICD-10-CM | POA: Diagnosis not present

## 2020-02-07 DIAGNOSIS — D631 Anemia in chronic kidney disease: Secondary | ICD-10-CM | POA: Diagnosis not present

## 2020-02-07 DIAGNOSIS — Z992 Dependence on renal dialysis: Secondary | ICD-10-CM | POA: Diagnosis not present

## 2020-02-07 DIAGNOSIS — D688 Other specified coagulation defects: Secondary | ICD-10-CM | POA: Diagnosis not present

## 2020-02-07 DIAGNOSIS — N186 End stage renal disease: Secondary | ICD-10-CM | POA: Diagnosis not present

## 2020-02-09 DIAGNOSIS — D688 Other specified coagulation defects: Secondary | ICD-10-CM | POA: Diagnosis not present

## 2020-02-09 DIAGNOSIS — D509 Iron deficiency anemia, unspecified: Secondary | ICD-10-CM | POA: Diagnosis not present

## 2020-02-09 DIAGNOSIS — N2581 Secondary hyperparathyroidism of renal origin: Secondary | ICD-10-CM | POA: Diagnosis not present

## 2020-02-09 DIAGNOSIS — N186 End stage renal disease: Secondary | ICD-10-CM | POA: Diagnosis not present

## 2020-02-09 DIAGNOSIS — D631 Anemia in chronic kidney disease: Secondary | ICD-10-CM | POA: Diagnosis not present

## 2020-02-09 DIAGNOSIS — Z992 Dependence on renal dialysis: Secondary | ICD-10-CM | POA: Diagnosis not present

## 2020-02-12 DIAGNOSIS — M62830 Muscle spasm of back: Secondary | ICD-10-CM | POA: Diagnosis not present

## 2020-02-12 DIAGNOSIS — M9903 Segmental and somatic dysfunction of lumbar region: Secondary | ICD-10-CM | POA: Diagnosis not present

## 2020-02-12 DIAGNOSIS — N186 End stage renal disease: Secondary | ICD-10-CM | POA: Diagnosis not present

## 2020-02-12 DIAGNOSIS — D688 Other specified coagulation defects: Secondary | ICD-10-CM | POA: Diagnosis not present

## 2020-02-12 DIAGNOSIS — N2581 Secondary hyperparathyroidism of renal origin: Secondary | ICD-10-CM | POA: Diagnosis not present

## 2020-02-12 DIAGNOSIS — D509 Iron deficiency anemia, unspecified: Secondary | ICD-10-CM | POA: Diagnosis not present

## 2020-02-12 DIAGNOSIS — D631 Anemia in chronic kidney disease: Secondary | ICD-10-CM | POA: Diagnosis not present

## 2020-02-12 DIAGNOSIS — M9901 Segmental and somatic dysfunction of cervical region: Secondary | ICD-10-CM | POA: Diagnosis not present

## 2020-02-12 DIAGNOSIS — Z992 Dependence on renal dialysis: Secondary | ICD-10-CM | POA: Diagnosis not present

## 2020-02-12 DIAGNOSIS — M5412 Radiculopathy, cervical region: Secondary | ICD-10-CM | POA: Diagnosis not present

## 2020-02-12 DIAGNOSIS — M5441 Lumbago with sciatica, right side: Secondary | ICD-10-CM | POA: Diagnosis not present

## 2020-02-12 DIAGNOSIS — M9902 Segmental and somatic dysfunction of thoracic region: Secondary | ICD-10-CM | POA: Diagnosis not present

## 2020-02-14 DIAGNOSIS — D509 Iron deficiency anemia, unspecified: Secondary | ICD-10-CM | POA: Diagnosis not present

## 2020-02-14 DIAGNOSIS — J3489 Other specified disorders of nose and nasal sinuses: Secondary | ICD-10-CM | POA: Diagnosis not present

## 2020-02-14 DIAGNOSIS — D688 Other specified coagulation defects: Secondary | ICD-10-CM | POA: Diagnosis not present

## 2020-02-14 DIAGNOSIS — Z992 Dependence on renal dialysis: Secondary | ICD-10-CM | POA: Diagnosis not present

## 2020-02-14 DIAGNOSIS — N186 End stage renal disease: Secondary | ICD-10-CM | POA: Diagnosis not present

## 2020-02-14 DIAGNOSIS — J300 Vasomotor rhinitis: Secondary | ICD-10-CM | POA: Diagnosis not present

## 2020-02-14 DIAGNOSIS — D631 Anemia in chronic kidney disease: Secondary | ICD-10-CM | POA: Diagnosis not present

## 2020-02-14 DIAGNOSIS — N2581 Secondary hyperparathyroidism of renal origin: Secondary | ICD-10-CM | POA: Diagnosis not present

## 2020-02-16 DIAGNOSIS — N186 End stage renal disease: Secondary | ICD-10-CM | POA: Diagnosis not present

## 2020-02-16 DIAGNOSIS — D509 Iron deficiency anemia, unspecified: Secondary | ICD-10-CM | POA: Diagnosis not present

## 2020-02-16 DIAGNOSIS — Z992 Dependence on renal dialysis: Secondary | ICD-10-CM | POA: Diagnosis not present

## 2020-02-16 DIAGNOSIS — D631 Anemia in chronic kidney disease: Secondary | ICD-10-CM | POA: Diagnosis not present

## 2020-02-16 DIAGNOSIS — D688 Other specified coagulation defects: Secondary | ICD-10-CM | POA: Diagnosis not present

## 2020-02-16 DIAGNOSIS — N2581 Secondary hyperparathyroidism of renal origin: Secondary | ICD-10-CM | POA: Diagnosis not present

## 2020-02-18 DIAGNOSIS — M9901 Segmental and somatic dysfunction of cervical region: Secondary | ICD-10-CM | POA: Diagnosis not present

## 2020-02-18 DIAGNOSIS — M5441 Lumbago with sciatica, right side: Secondary | ICD-10-CM | POA: Diagnosis not present

## 2020-02-18 DIAGNOSIS — M5136 Other intervertebral disc degeneration, lumbar region: Secondary | ICD-10-CM | POA: Diagnosis not present

## 2020-02-19 DIAGNOSIS — M62830 Muscle spasm of back: Secondary | ICD-10-CM | POA: Diagnosis not present

## 2020-02-19 DIAGNOSIS — M9902 Segmental and somatic dysfunction of thoracic region: Secondary | ICD-10-CM | POA: Diagnosis not present

## 2020-02-19 DIAGNOSIS — Z992 Dependence on renal dialysis: Secondary | ICD-10-CM | POA: Diagnosis not present

## 2020-02-19 DIAGNOSIS — N186 End stage renal disease: Secondary | ICD-10-CM | POA: Diagnosis not present

## 2020-02-19 DIAGNOSIS — D688 Other specified coagulation defects: Secondary | ICD-10-CM | POA: Diagnosis not present

## 2020-02-19 DIAGNOSIS — M9901 Segmental and somatic dysfunction of cervical region: Secondary | ICD-10-CM | POA: Diagnosis not present

## 2020-02-19 DIAGNOSIS — M9903 Segmental and somatic dysfunction of lumbar region: Secondary | ICD-10-CM | POA: Diagnosis not present

## 2020-02-19 DIAGNOSIS — D631 Anemia in chronic kidney disease: Secondary | ICD-10-CM | POA: Diagnosis not present

## 2020-02-19 DIAGNOSIS — M5412 Radiculopathy, cervical region: Secondary | ICD-10-CM | POA: Diagnosis not present

## 2020-02-19 DIAGNOSIS — M5441 Lumbago with sciatica, right side: Secondary | ICD-10-CM | POA: Diagnosis not present

## 2020-02-19 DIAGNOSIS — D509 Iron deficiency anemia, unspecified: Secondary | ICD-10-CM | POA: Diagnosis not present

## 2020-02-19 DIAGNOSIS — N2581 Secondary hyperparathyroidism of renal origin: Secondary | ICD-10-CM | POA: Diagnosis not present

## 2020-02-20 DIAGNOSIS — M5136 Other intervertebral disc degeneration, lumbar region: Secondary | ICD-10-CM | POA: Diagnosis not present

## 2020-02-20 DIAGNOSIS — R293 Abnormal posture: Secondary | ICD-10-CM | POA: Diagnosis not present

## 2020-02-20 DIAGNOSIS — M5441 Lumbago with sciatica, right side: Secondary | ICD-10-CM | POA: Diagnosis not present

## 2020-02-20 DIAGNOSIS — R29898 Other symptoms and signs involving the musculoskeletal system: Secondary | ICD-10-CM | POA: Diagnosis not present

## 2020-02-20 DIAGNOSIS — M9901 Segmental and somatic dysfunction of cervical region: Secondary | ICD-10-CM | POA: Diagnosis not present

## 2020-02-20 DIAGNOSIS — G8929 Other chronic pain: Secondary | ICD-10-CM | POA: Diagnosis not present

## 2020-02-20 DIAGNOSIS — R6889 Other general symptoms and signs: Secondary | ICD-10-CM | POA: Diagnosis not present

## 2020-02-21 DIAGNOSIS — D509 Iron deficiency anemia, unspecified: Secondary | ICD-10-CM | POA: Diagnosis not present

## 2020-02-21 DIAGNOSIS — N2581 Secondary hyperparathyroidism of renal origin: Secondary | ICD-10-CM | POA: Diagnosis not present

## 2020-02-21 DIAGNOSIS — D688 Other specified coagulation defects: Secondary | ICD-10-CM | POA: Diagnosis not present

## 2020-02-21 DIAGNOSIS — Z992 Dependence on renal dialysis: Secondary | ICD-10-CM | POA: Diagnosis not present

## 2020-02-21 DIAGNOSIS — N186 End stage renal disease: Secondary | ICD-10-CM | POA: Diagnosis not present

## 2020-02-21 DIAGNOSIS — D631 Anemia in chronic kidney disease: Secondary | ICD-10-CM | POA: Diagnosis not present

## 2020-02-23 DIAGNOSIS — D509 Iron deficiency anemia, unspecified: Secondary | ICD-10-CM | POA: Diagnosis not present

## 2020-02-23 DIAGNOSIS — D688 Other specified coagulation defects: Secondary | ICD-10-CM | POA: Diagnosis not present

## 2020-02-23 DIAGNOSIS — Z992 Dependence on renal dialysis: Secondary | ICD-10-CM | POA: Diagnosis not present

## 2020-02-23 DIAGNOSIS — N186 End stage renal disease: Secondary | ICD-10-CM | POA: Diagnosis not present

## 2020-02-23 DIAGNOSIS — D631 Anemia in chronic kidney disease: Secondary | ICD-10-CM | POA: Diagnosis not present

## 2020-02-23 DIAGNOSIS — N2581 Secondary hyperparathyroidism of renal origin: Secondary | ICD-10-CM | POA: Diagnosis not present

## 2020-02-25 DIAGNOSIS — M5136 Other intervertebral disc degeneration, lumbar region: Secondary | ICD-10-CM | POA: Diagnosis not present

## 2020-02-25 DIAGNOSIS — M5441 Lumbago with sciatica, right side: Secondary | ICD-10-CM | POA: Diagnosis not present

## 2020-02-25 DIAGNOSIS — M9901 Segmental and somatic dysfunction of cervical region: Secondary | ICD-10-CM | POA: Diagnosis not present

## 2020-02-26 DIAGNOSIS — N186 End stage renal disease: Secondary | ICD-10-CM | POA: Diagnosis not present

## 2020-02-26 DIAGNOSIS — D509 Iron deficiency anemia, unspecified: Secondary | ICD-10-CM | POA: Diagnosis not present

## 2020-02-26 DIAGNOSIS — Z992 Dependence on renal dialysis: Secondary | ICD-10-CM | POA: Diagnosis not present

## 2020-02-26 DIAGNOSIS — D688 Other specified coagulation defects: Secondary | ICD-10-CM | POA: Diagnosis not present

## 2020-02-26 DIAGNOSIS — D631 Anemia in chronic kidney disease: Secondary | ICD-10-CM | POA: Diagnosis not present

## 2020-02-26 DIAGNOSIS — N2581 Secondary hyperparathyroidism of renal origin: Secondary | ICD-10-CM | POA: Diagnosis not present

## 2020-02-27 DIAGNOSIS — R293 Abnormal posture: Secondary | ICD-10-CM | POA: Diagnosis not present

## 2020-02-27 DIAGNOSIS — M5441 Lumbago with sciatica, right side: Secondary | ICD-10-CM | POA: Diagnosis not present

## 2020-02-27 DIAGNOSIS — M5136 Other intervertebral disc degeneration, lumbar region: Secondary | ICD-10-CM | POA: Diagnosis not present

## 2020-02-27 DIAGNOSIS — M9901 Segmental and somatic dysfunction of cervical region: Secondary | ICD-10-CM | POA: Diagnosis not present

## 2020-02-27 DIAGNOSIS — R6889 Other general symptoms and signs: Secondary | ICD-10-CM | POA: Diagnosis not present

## 2020-02-27 DIAGNOSIS — G8929 Other chronic pain: Secondary | ICD-10-CM | POA: Diagnosis not present

## 2020-02-27 DIAGNOSIS — R29898 Other symptoms and signs involving the musculoskeletal system: Secondary | ICD-10-CM | POA: Diagnosis not present

## 2020-02-28 DIAGNOSIS — D509 Iron deficiency anemia, unspecified: Secondary | ICD-10-CM | POA: Diagnosis not present

## 2020-02-28 DIAGNOSIS — D688 Other specified coagulation defects: Secondary | ICD-10-CM | POA: Diagnosis not present

## 2020-02-28 DIAGNOSIS — N2581 Secondary hyperparathyroidism of renal origin: Secondary | ICD-10-CM | POA: Diagnosis not present

## 2020-02-28 DIAGNOSIS — D631 Anemia in chronic kidney disease: Secondary | ICD-10-CM | POA: Diagnosis not present

## 2020-02-28 DIAGNOSIS — N186 End stage renal disease: Secondary | ICD-10-CM | POA: Diagnosis not present

## 2020-02-28 DIAGNOSIS — Z992 Dependence on renal dialysis: Secondary | ICD-10-CM | POA: Diagnosis not present

## 2020-03-01 DIAGNOSIS — D688 Other specified coagulation defects: Secondary | ICD-10-CM | POA: Diagnosis not present

## 2020-03-01 DIAGNOSIS — Z992 Dependence on renal dialysis: Secondary | ICD-10-CM | POA: Diagnosis not present

## 2020-03-01 DIAGNOSIS — N2581 Secondary hyperparathyroidism of renal origin: Secondary | ICD-10-CM | POA: Diagnosis not present

## 2020-03-01 DIAGNOSIS — D509 Iron deficiency anemia, unspecified: Secondary | ICD-10-CM | POA: Diagnosis not present

## 2020-03-01 DIAGNOSIS — D631 Anemia in chronic kidney disease: Secondary | ICD-10-CM | POA: Diagnosis not present

## 2020-03-01 DIAGNOSIS — N186 End stage renal disease: Secondary | ICD-10-CM | POA: Diagnosis not present

## 2020-03-03 DIAGNOSIS — N019 Rapidly progressive nephritic syndrome with unspecified morphologic changes: Secondary | ICD-10-CM | POA: Diagnosis not present

## 2020-03-03 DIAGNOSIS — M5441 Lumbago with sciatica, right side: Secondary | ICD-10-CM | POA: Diagnosis not present

## 2020-03-03 DIAGNOSIS — Z992 Dependence on renal dialysis: Secondary | ICD-10-CM | POA: Diagnosis not present

## 2020-03-03 DIAGNOSIS — M5136 Other intervertebral disc degeneration, lumbar region: Secondary | ICD-10-CM | POA: Diagnosis not present

## 2020-03-03 DIAGNOSIS — N186 End stage renal disease: Secondary | ICD-10-CM | POA: Diagnosis not present

## 2020-03-03 DIAGNOSIS — M9901 Segmental and somatic dysfunction of cervical region: Secondary | ICD-10-CM | POA: Diagnosis not present

## 2020-03-04 DIAGNOSIS — Z992 Dependence on renal dialysis: Secondary | ICD-10-CM | POA: Diagnosis not present

## 2020-03-04 DIAGNOSIS — N2581 Secondary hyperparathyroidism of renal origin: Secondary | ICD-10-CM | POA: Diagnosis not present

## 2020-03-04 DIAGNOSIS — N186 End stage renal disease: Secondary | ICD-10-CM | POA: Diagnosis not present

## 2020-03-04 DIAGNOSIS — D688 Other specified coagulation defects: Secondary | ICD-10-CM | POA: Diagnosis not present

## 2020-03-06 DIAGNOSIS — N186 End stage renal disease: Secondary | ICD-10-CM | POA: Diagnosis not present

## 2020-03-06 DIAGNOSIS — Z992 Dependence on renal dialysis: Secondary | ICD-10-CM | POA: Diagnosis not present

## 2020-03-06 DIAGNOSIS — D688 Other specified coagulation defects: Secondary | ICD-10-CM | POA: Diagnosis not present

## 2020-03-06 DIAGNOSIS — N2581 Secondary hyperparathyroidism of renal origin: Secondary | ICD-10-CM | POA: Diagnosis not present

## 2020-03-07 DIAGNOSIS — M5441 Lumbago with sciatica, right side: Secondary | ICD-10-CM | POA: Diagnosis not present

## 2020-03-07 DIAGNOSIS — R293 Abnormal posture: Secondary | ICD-10-CM | POA: Diagnosis not present

## 2020-03-07 DIAGNOSIS — R29898 Other symptoms and signs involving the musculoskeletal system: Secondary | ICD-10-CM | POA: Diagnosis not present

## 2020-03-07 DIAGNOSIS — G8929 Other chronic pain: Secondary | ICD-10-CM | POA: Diagnosis not present

## 2020-03-07 DIAGNOSIS — R6889 Other general symptoms and signs: Secondary | ICD-10-CM | POA: Diagnosis not present

## 2020-03-08 DIAGNOSIS — D688 Other specified coagulation defects: Secondary | ICD-10-CM | POA: Diagnosis not present

## 2020-03-08 DIAGNOSIS — N186 End stage renal disease: Secondary | ICD-10-CM | POA: Diagnosis not present

## 2020-03-08 DIAGNOSIS — N2581 Secondary hyperparathyroidism of renal origin: Secondary | ICD-10-CM | POA: Diagnosis not present

## 2020-03-08 DIAGNOSIS — Z992 Dependence on renal dialysis: Secondary | ICD-10-CM | POA: Diagnosis not present

## 2020-03-10 DIAGNOSIS — G8929 Other chronic pain: Secondary | ICD-10-CM | POA: Diagnosis not present

## 2020-03-10 DIAGNOSIS — M5441 Lumbago with sciatica, right side: Secondary | ICD-10-CM | POA: Diagnosis not present

## 2020-03-10 DIAGNOSIS — R293 Abnormal posture: Secondary | ICD-10-CM | POA: Diagnosis not present

## 2020-03-10 DIAGNOSIS — R29898 Other symptoms and signs involving the musculoskeletal system: Secondary | ICD-10-CM | POA: Diagnosis not present

## 2020-03-10 DIAGNOSIS — R6889 Other general symptoms and signs: Secondary | ICD-10-CM | POA: Diagnosis not present

## 2020-03-11 DIAGNOSIS — N186 End stage renal disease: Secondary | ICD-10-CM | POA: Diagnosis not present

## 2020-03-11 DIAGNOSIS — M461 Sacroiliitis, not elsewhere classified: Secondary | ICD-10-CM | POA: Diagnosis not present

## 2020-03-11 DIAGNOSIS — M7071 Other bursitis of hip, right hip: Secondary | ICD-10-CM | POA: Diagnosis not present

## 2020-03-11 DIAGNOSIS — M5441 Lumbago with sciatica, right side: Secondary | ICD-10-CM | POA: Diagnosis not present

## 2020-03-11 DIAGNOSIS — M779 Enthesopathy, unspecified: Secondary | ICD-10-CM | POA: Diagnosis not present

## 2020-03-11 DIAGNOSIS — Z992 Dependence on renal dialysis: Secondary | ICD-10-CM | POA: Diagnosis not present

## 2020-03-11 DIAGNOSIS — D688 Other specified coagulation defects: Secondary | ICD-10-CM | POA: Diagnosis not present

## 2020-03-11 DIAGNOSIS — N2581 Secondary hyperparathyroidism of renal origin: Secondary | ICD-10-CM | POA: Diagnosis not present

## 2020-03-12 DIAGNOSIS — G8929 Other chronic pain: Secondary | ICD-10-CM | POA: Diagnosis not present

## 2020-03-12 DIAGNOSIS — R6889 Other general symptoms and signs: Secondary | ICD-10-CM | POA: Diagnosis not present

## 2020-03-12 DIAGNOSIS — M5441 Lumbago with sciatica, right side: Secondary | ICD-10-CM | POA: Diagnosis not present

## 2020-03-12 DIAGNOSIS — R293 Abnormal posture: Secondary | ICD-10-CM | POA: Diagnosis not present

## 2020-03-12 DIAGNOSIS — R29898 Other symptoms and signs involving the musculoskeletal system: Secondary | ICD-10-CM | POA: Diagnosis not present

## 2020-03-13 DIAGNOSIS — D688 Other specified coagulation defects: Secondary | ICD-10-CM | POA: Diagnosis not present

## 2020-03-13 DIAGNOSIS — N186 End stage renal disease: Secondary | ICD-10-CM | POA: Diagnosis not present

## 2020-03-13 DIAGNOSIS — N2581 Secondary hyperparathyroidism of renal origin: Secondary | ICD-10-CM | POA: Diagnosis not present

## 2020-03-13 DIAGNOSIS — Z992 Dependence on renal dialysis: Secondary | ICD-10-CM | POA: Diagnosis not present

## 2020-03-15 DIAGNOSIS — D688 Other specified coagulation defects: Secondary | ICD-10-CM | POA: Diagnosis not present

## 2020-03-15 DIAGNOSIS — Z992 Dependence on renal dialysis: Secondary | ICD-10-CM | POA: Diagnosis not present

## 2020-03-15 DIAGNOSIS — N2581 Secondary hyperparathyroidism of renal origin: Secondary | ICD-10-CM | POA: Diagnosis not present

## 2020-03-15 DIAGNOSIS — N186 End stage renal disease: Secondary | ICD-10-CM | POA: Diagnosis not present

## 2020-03-18 DIAGNOSIS — Z992 Dependence on renal dialysis: Secondary | ICD-10-CM | POA: Diagnosis not present

## 2020-03-18 DIAGNOSIS — N2581 Secondary hyperparathyroidism of renal origin: Secondary | ICD-10-CM | POA: Diagnosis not present

## 2020-03-18 DIAGNOSIS — D688 Other specified coagulation defects: Secondary | ICD-10-CM | POA: Diagnosis not present

## 2020-03-18 DIAGNOSIS — N186 End stage renal disease: Secondary | ICD-10-CM | POA: Diagnosis not present

## 2020-03-19 DIAGNOSIS — Z961 Presence of intraocular lens: Secondary | ICD-10-CM | POA: Diagnosis not present

## 2020-03-19 DIAGNOSIS — R6889 Other general symptoms and signs: Secondary | ICD-10-CM | POA: Diagnosis not present

## 2020-03-19 DIAGNOSIS — R29898 Other symptoms and signs involving the musculoskeletal system: Secondary | ICD-10-CM | POA: Diagnosis not present

## 2020-03-19 DIAGNOSIS — H16223 Keratoconjunctivitis sicca, not specified as Sjogren's, bilateral: Secondary | ICD-10-CM | POA: Diagnosis not present

## 2020-03-19 DIAGNOSIS — H43813 Vitreous degeneration, bilateral: Secondary | ICD-10-CM | POA: Diagnosis not present

## 2020-03-19 DIAGNOSIS — R293 Abnormal posture: Secondary | ICD-10-CM | POA: Diagnosis not present

## 2020-03-19 DIAGNOSIS — M5441 Lumbago with sciatica, right side: Secondary | ICD-10-CM | POA: Diagnosis not present

## 2020-03-19 DIAGNOSIS — G8929 Other chronic pain: Secondary | ICD-10-CM | POA: Diagnosis not present

## 2020-03-20 DIAGNOSIS — N2581 Secondary hyperparathyroidism of renal origin: Secondary | ICD-10-CM | POA: Diagnosis not present

## 2020-03-20 DIAGNOSIS — N186 End stage renal disease: Secondary | ICD-10-CM | POA: Diagnosis not present

## 2020-03-20 DIAGNOSIS — Z992 Dependence on renal dialysis: Secondary | ICD-10-CM | POA: Diagnosis not present

## 2020-03-20 DIAGNOSIS — D688 Other specified coagulation defects: Secondary | ICD-10-CM | POA: Diagnosis not present

## 2020-03-21 DIAGNOSIS — M7071 Other bursitis of hip, right hip: Secondary | ICD-10-CM | POA: Diagnosis not present

## 2020-03-21 DIAGNOSIS — M779 Enthesopathy, unspecified: Secondary | ICD-10-CM | POA: Diagnosis not present

## 2020-03-22 DIAGNOSIS — Z992 Dependence on renal dialysis: Secondary | ICD-10-CM | POA: Diagnosis not present

## 2020-03-22 DIAGNOSIS — N2581 Secondary hyperparathyroidism of renal origin: Secondary | ICD-10-CM | POA: Diagnosis not present

## 2020-03-22 DIAGNOSIS — D688 Other specified coagulation defects: Secondary | ICD-10-CM | POA: Diagnosis not present

## 2020-03-22 DIAGNOSIS — N186 End stage renal disease: Secondary | ICD-10-CM | POA: Diagnosis not present

## 2020-03-25 DIAGNOSIS — N2581 Secondary hyperparathyroidism of renal origin: Secondary | ICD-10-CM | POA: Diagnosis not present

## 2020-03-25 DIAGNOSIS — D688 Other specified coagulation defects: Secondary | ICD-10-CM | POA: Diagnosis not present

## 2020-03-25 DIAGNOSIS — N186 End stage renal disease: Secondary | ICD-10-CM | POA: Diagnosis not present

## 2020-03-25 DIAGNOSIS — Z992 Dependence on renal dialysis: Secondary | ICD-10-CM | POA: Diagnosis not present

## 2020-03-27 DIAGNOSIS — N186 End stage renal disease: Secondary | ICD-10-CM | POA: Diagnosis not present

## 2020-03-27 DIAGNOSIS — N2581 Secondary hyperparathyroidism of renal origin: Secondary | ICD-10-CM | POA: Diagnosis not present

## 2020-03-27 DIAGNOSIS — Z7682 Awaiting organ transplant status: Secondary | ICD-10-CM | POA: Diagnosis not present

## 2020-03-27 DIAGNOSIS — D688 Other specified coagulation defects: Secondary | ICD-10-CM | POA: Diagnosis not present

## 2020-03-27 DIAGNOSIS — Z992 Dependence on renal dialysis: Secondary | ICD-10-CM | POA: Diagnosis not present

## 2020-03-29 DIAGNOSIS — N186 End stage renal disease: Secondary | ICD-10-CM | POA: Diagnosis not present

## 2020-03-29 DIAGNOSIS — N2581 Secondary hyperparathyroidism of renal origin: Secondary | ICD-10-CM | POA: Diagnosis not present

## 2020-03-29 DIAGNOSIS — D688 Other specified coagulation defects: Secondary | ICD-10-CM | POA: Diagnosis not present

## 2020-03-29 DIAGNOSIS — Z992 Dependence on renal dialysis: Secondary | ICD-10-CM | POA: Diagnosis not present

## 2020-04-01 DIAGNOSIS — N2581 Secondary hyperparathyroidism of renal origin: Secondary | ICD-10-CM | POA: Diagnosis not present

## 2020-04-01 DIAGNOSIS — N186 End stage renal disease: Secondary | ICD-10-CM | POA: Diagnosis not present

## 2020-04-01 DIAGNOSIS — D688 Other specified coagulation defects: Secondary | ICD-10-CM | POA: Diagnosis not present

## 2020-04-01 DIAGNOSIS — Z992 Dependence on renal dialysis: Secondary | ICD-10-CM | POA: Diagnosis not present

## 2020-04-02 DIAGNOSIS — G8929 Other chronic pain: Secondary | ICD-10-CM | POA: Diagnosis not present

## 2020-04-02 DIAGNOSIS — R293 Abnormal posture: Secondary | ICD-10-CM | POA: Diagnosis not present

## 2020-04-02 DIAGNOSIS — M5441 Lumbago with sciatica, right side: Secondary | ICD-10-CM | POA: Diagnosis not present

## 2020-04-02 DIAGNOSIS — R29898 Other symptoms and signs involving the musculoskeletal system: Secondary | ICD-10-CM | POA: Diagnosis not present

## 2020-04-02 DIAGNOSIS — R6889 Other general symptoms and signs: Secondary | ICD-10-CM | POA: Diagnosis not present

## 2020-04-03 DIAGNOSIS — Z992 Dependence on renal dialysis: Secondary | ICD-10-CM | POA: Diagnosis not present

## 2020-04-03 DIAGNOSIS — N2581 Secondary hyperparathyroidism of renal origin: Secondary | ICD-10-CM | POA: Diagnosis not present

## 2020-04-03 DIAGNOSIS — D688 Other specified coagulation defects: Secondary | ICD-10-CM | POA: Diagnosis not present

## 2020-04-03 DIAGNOSIS — N186 End stage renal disease: Secondary | ICD-10-CM | POA: Diagnosis not present

## 2020-04-03 DIAGNOSIS — N019 Rapidly progressive nephritic syndrome with unspecified morphologic changes: Secondary | ICD-10-CM | POA: Diagnosis not present

## 2020-04-05 DIAGNOSIS — N2581 Secondary hyperparathyroidism of renal origin: Secondary | ICD-10-CM | POA: Diagnosis not present

## 2020-04-05 DIAGNOSIS — N186 End stage renal disease: Secondary | ICD-10-CM | POA: Diagnosis not present

## 2020-04-05 DIAGNOSIS — D688 Other specified coagulation defects: Secondary | ICD-10-CM | POA: Diagnosis not present

## 2020-04-05 DIAGNOSIS — Z992 Dependence on renal dialysis: Secondary | ICD-10-CM | POA: Diagnosis not present

## 2020-04-07 DIAGNOSIS — M5441 Lumbago with sciatica, right side: Secondary | ICD-10-CM | POA: Diagnosis not present

## 2020-04-07 DIAGNOSIS — R6889 Other general symptoms and signs: Secondary | ICD-10-CM | POA: Diagnosis not present

## 2020-04-07 DIAGNOSIS — G8929 Other chronic pain: Secondary | ICD-10-CM | POA: Diagnosis not present

## 2020-04-07 DIAGNOSIS — R293 Abnormal posture: Secondary | ICD-10-CM | POA: Diagnosis not present

## 2020-04-07 DIAGNOSIS — R29898 Other symptoms and signs involving the musculoskeletal system: Secondary | ICD-10-CM | POA: Diagnosis not present

## 2020-04-08 DIAGNOSIS — Z992 Dependence on renal dialysis: Secondary | ICD-10-CM | POA: Diagnosis not present

## 2020-04-08 DIAGNOSIS — N2581 Secondary hyperparathyroidism of renal origin: Secondary | ICD-10-CM | POA: Diagnosis not present

## 2020-04-08 DIAGNOSIS — D688 Other specified coagulation defects: Secondary | ICD-10-CM | POA: Diagnosis not present

## 2020-04-08 DIAGNOSIS — N186 End stage renal disease: Secondary | ICD-10-CM | POA: Diagnosis not present

## 2020-04-09 ENCOUNTER — Ambulatory Visit: Payer: Medicare HMO | Attending: Internal Medicine

## 2020-04-09 DIAGNOSIS — Z23 Encounter for immunization: Secondary | ICD-10-CM

## 2020-04-09 NOTE — Progress Notes (Signed)
   Covid-19 Vaccination Clinic  Name:  Cristina Wilson    MRN: [id number] DOB: Mar 16, 1950  04/09/2020  [name] was observed post Covid-19 immunization for 15 minutes without incident. She was provided with Vaccine Information Sheet and instruction to access the V-Safe system.   [name] was instructed to call 911 with any severe reactions post vaccine: Marland Kitchen Difficulty breathing  . Swelling of face and throat  . A fast heartbeat  . A bad rash all over body  . Dizziness and weakness   Immunizations Administered    Name Date Dose VIS Date Route   PFIZER Comrnaty(Gray TOP) Covid-19 Vaccine 04/09/2020 11:36 AM 0.3 mL 12/13/2019 Intramuscular   Manufacturer: [employer], [employer]   Lot: [id number]   [place]: [phone]

## 2020-04-10 DIAGNOSIS — Z7682 Awaiting organ transplant status: Secondary | ICD-10-CM | POA: Diagnosis not present

## 2020-04-10 DIAGNOSIS — N186 End stage renal disease: Secondary | ICD-10-CM | POA: Diagnosis not present

## 2020-04-10 DIAGNOSIS — D688 Other specified coagulation defects: Secondary | ICD-10-CM | POA: Diagnosis not present

## 2020-04-10 DIAGNOSIS — N2581 Secondary hyperparathyroidism of renal origin: Secondary | ICD-10-CM | POA: Diagnosis not present

## 2020-04-10 DIAGNOSIS — Z992 Dependence on renal dialysis: Secondary | ICD-10-CM | POA: Diagnosis not present

## 2020-04-12 DIAGNOSIS — N2581 Secondary hyperparathyroidism of renal origin: Secondary | ICD-10-CM | POA: Diagnosis not present

## 2020-04-12 DIAGNOSIS — Z992 Dependence on renal dialysis: Secondary | ICD-10-CM | POA: Diagnosis not present

## 2020-04-12 DIAGNOSIS — N186 End stage renal disease: Secondary | ICD-10-CM | POA: Diagnosis not present

## 2020-04-12 DIAGNOSIS — D688 Other specified coagulation defects: Secondary | ICD-10-CM | POA: Diagnosis not present

## 2020-04-15 DIAGNOSIS — N186 End stage renal disease: Secondary | ICD-10-CM | POA: Diagnosis not present

## 2020-04-15 DIAGNOSIS — N2581 Secondary hyperparathyroidism of renal origin: Secondary | ICD-10-CM | POA: Diagnosis not present

## 2020-04-15 DIAGNOSIS — D688 Other specified coagulation defects: Secondary | ICD-10-CM | POA: Diagnosis not present

## 2020-04-15 DIAGNOSIS — Z992 Dependence on renal dialysis: Secondary | ICD-10-CM | POA: Diagnosis not present

## 2020-04-15 DIAGNOSIS — D631 Anemia in chronic kidney disease: Secondary | ICD-10-CM | POA: Diagnosis not present

## 2020-04-15 DIAGNOSIS — M310 Hypersensitivity angiitis: Secondary | ICD-10-CM | POA: Diagnosis not present

## 2020-04-17 DIAGNOSIS — D631 Anemia in chronic kidney disease: Secondary | ICD-10-CM | POA: Diagnosis not present

## 2020-04-17 DIAGNOSIS — D688 Other specified coagulation defects: Secondary | ICD-10-CM | POA: Diagnosis not present

## 2020-04-17 DIAGNOSIS — N2581 Secondary hyperparathyroidism of renal origin: Secondary | ICD-10-CM | POA: Diagnosis not present

## 2020-04-17 DIAGNOSIS — Z992 Dependence on renal dialysis: Secondary | ICD-10-CM | POA: Diagnosis not present

## 2020-04-17 DIAGNOSIS — N186 End stage renal disease: Secondary | ICD-10-CM | POA: Diagnosis not present

## 2020-04-17 DIAGNOSIS — M310 Hypersensitivity angiitis: Secondary | ICD-10-CM | POA: Diagnosis not present

## 2020-04-18 DIAGNOSIS — M5441 Lumbago with sciatica, right side: Secondary | ICD-10-CM | POA: Diagnosis not present

## 2020-04-18 DIAGNOSIS — R293 Abnormal posture: Secondary | ICD-10-CM | POA: Diagnosis not present

## 2020-04-18 DIAGNOSIS — G8929 Other chronic pain: Secondary | ICD-10-CM | POA: Diagnosis not present

## 2020-04-18 DIAGNOSIS — R6889 Other general symptoms and signs: Secondary | ICD-10-CM | POA: Diagnosis not present

## 2020-04-18 DIAGNOSIS — R29898 Other symptoms and signs involving the musculoskeletal system: Secondary | ICD-10-CM | POA: Diagnosis not present

## 2020-04-19 DIAGNOSIS — N2581 Secondary hyperparathyroidism of renal origin: Secondary | ICD-10-CM | POA: Diagnosis not present

## 2020-04-19 DIAGNOSIS — D631 Anemia in chronic kidney disease: Secondary | ICD-10-CM | POA: Diagnosis not present

## 2020-04-19 DIAGNOSIS — N186 End stage renal disease: Secondary | ICD-10-CM | POA: Diagnosis not present

## 2020-04-19 DIAGNOSIS — M310 Hypersensitivity angiitis: Secondary | ICD-10-CM | POA: Diagnosis not present

## 2020-04-19 DIAGNOSIS — Z992 Dependence on renal dialysis: Secondary | ICD-10-CM | POA: Diagnosis not present

## 2020-04-19 DIAGNOSIS — D688 Other specified coagulation defects: Secondary | ICD-10-CM | POA: Diagnosis not present

## 2020-04-21 ENCOUNTER — Other Ambulatory Visit (HOSPITAL_COMMUNITY): Payer: Self-pay

## 2020-04-21 MED ORDER — COVID-19 MRNA VAC-TRIS(PFIZER) 30 MCG/0.3ML IM SUSP
INTRAMUSCULAR | 0 refills | Status: AC
  Filled 2020-04-21: qty 0.3, 17d supply, fill #0

## 2020-04-22 DIAGNOSIS — Z992 Dependence on renal dialysis: Secondary | ICD-10-CM | POA: Diagnosis not present

## 2020-04-22 DIAGNOSIS — N2581 Secondary hyperparathyroidism of renal origin: Secondary | ICD-10-CM | POA: Diagnosis not present

## 2020-04-22 DIAGNOSIS — N186 End stage renal disease: Secondary | ICD-10-CM | POA: Diagnosis not present

## 2020-04-22 DIAGNOSIS — D688 Other specified coagulation defects: Secondary | ICD-10-CM | POA: Diagnosis not present

## 2020-04-23 ENCOUNTER — Other Ambulatory Visit (HOSPITAL_COMMUNITY): Payer: Self-pay

## 2020-04-24 DIAGNOSIS — Z992 Dependence on renal dialysis: Secondary | ICD-10-CM | POA: Diagnosis not present

## 2020-04-24 DIAGNOSIS — N186 End stage renal disease: Secondary | ICD-10-CM | POA: Diagnosis not present

## 2020-04-24 DIAGNOSIS — N2581 Secondary hyperparathyroidism of renal origin: Secondary | ICD-10-CM | POA: Diagnosis not present

## 2020-04-24 DIAGNOSIS — D688 Other specified coagulation defects: Secondary | ICD-10-CM | POA: Diagnosis not present

## 2020-04-26 DIAGNOSIS — Z992 Dependence on renal dialysis: Secondary | ICD-10-CM | POA: Diagnosis not present

## 2020-04-26 DIAGNOSIS — D688 Other specified coagulation defects: Secondary | ICD-10-CM | POA: Diagnosis not present

## 2020-04-26 DIAGNOSIS — N2581 Secondary hyperparathyroidism of renal origin: Secondary | ICD-10-CM | POA: Diagnosis not present

## 2020-04-26 DIAGNOSIS — N186 End stage renal disease: Secondary | ICD-10-CM | POA: Diagnosis not present

## 2020-04-29 DIAGNOSIS — D631 Anemia in chronic kidney disease: Secondary | ICD-10-CM | POA: Diagnosis not present

## 2020-04-29 DIAGNOSIS — N186 End stage renal disease: Secondary | ICD-10-CM | POA: Diagnosis not present

## 2020-04-29 DIAGNOSIS — D688 Other specified coagulation defects: Secondary | ICD-10-CM | POA: Diagnosis not present

## 2020-04-29 DIAGNOSIS — N2581 Secondary hyperparathyroidism of renal origin: Secondary | ICD-10-CM | POA: Diagnosis not present

## 2020-04-29 DIAGNOSIS — Z992 Dependence on renal dialysis: Secondary | ICD-10-CM | POA: Diagnosis not present

## 2020-05-01 DIAGNOSIS — N186 End stage renal disease: Secondary | ICD-10-CM | POA: Diagnosis not present

## 2020-05-01 DIAGNOSIS — D688 Other specified coagulation defects: Secondary | ICD-10-CM | POA: Diagnosis not present

## 2020-05-01 DIAGNOSIS — Z992 Dependence on renal dialysis: Secondary | ICD-10-CM | POA: Diagnosis not present

## 2020-05-01 DIAGNOSIS — D631 Anemia in chronic kidney disease: Secondary | ICD-10-CM | POA: Diagnosis not present

## 2020-05-01 DIAGNOSIS — N2581 Secondary hyperparathyroidism of renal origin: Secondary | ICD-10-CM | POA: Diagnosis not present

## 2020-05-03 DIAGNOSIS — N2581 Secondary hyperparathyroidism of renal origin: Secondary | ICD-10-CM | POA: Diagnosis not present

## 2020-05-03 DIAGNOSIS — D631 Anemia in chronic kidney disease: Secondary | ICD-10-CM | POA: Diagnosis not present

## 2020-05-03 DIAGNOSIS — N019 Rapidly progressive nephritic syndrome with unspecified morphologic changes: Secondary | ICD-10-CM | POA: Diagnosis not present

## 2020-05-03 DIAGNOSIS — Z992 Dependence on renal dialysis: Secondary | ICD-10-CM | POA: Diagnosis not present

## 2020-05-03 DIAGNOSIS — N186 End stage renal disease: Secondary | ICD-10-CM | POA: Diagnosis not present

## 2020-05-03 DIAGNOSIS — D688 Other specified coagulation defects: Secondary | ICD-10-CM | POA: Diagnosis not present

## 2020-05-05 DIAGNOSIS — N186 End stage renal disease: Secondary | ICD-10-CM | POA: Diagnosis not present

## 2020-05-05 DIAGNOSIS — I700 Atherosclerosis of aorta: Secondary | ICD-10-CM | POA: Diagnosis not present

## 2020-05-05 DIAGNOSIS — I129 Hypertensive chronic kidney disease with stage 1 through stage 4 chronic kidney disease, or unspecified chronic kidney disease: Secondary | ICD-10-CM | POA: Diagnosis not present

## 2020-05-05 DIAGNOSIS — E7800 Pure hypercholesterolemia, unspecified: Secondary | ICD-10-CM | POA: Diagnosis not present

## 2020-05-06 DIAGNOSIS — N186 End stage renal disease: Secondary | ICD-10-CM | POA: Diagnosis not present

## 2020-05-06 DIAGNOSIS — N2581 Secondary hyperparathyroidism of renal origin: Secondary | ICD-10-CM | POA: Diagnosis not present

## 2020-05-06 DIAGNOSIS — Z992 Dependence on renal dialysis: Secondary | ICD-10-CM | POA: Diagnosis not present

## 2020-05-06 DIAGNOSIS — D688 Other specified coagulation defects: Secondary | ICD-10-CM | POA: Diagnosis not present

## 2020-05-07 DIAGNOSIS — J984 Other disorders of lung: Secondary | ICD-10-CM | POA: Diagnosis not present

## 2020-05-07 DIAGNOSIS — Z01818 Encounter for other preprocedural examination: Secondary | ICD-10-CM | POA: Diagnosis not present

## 2020-05-08 DIAGNOSIS — N2581 Secondary hyperparathyroidism of renal origin: Secondary | ICD-10-CM | POA: Diagnosis not present

## 2020-05-08 DIAGNOSIS — N186 End stage renal disease: Secondary | ICD-10-CM | POA: Diagnosis not present

## 2020-05-08 DIAGNOSIS — D688 Other specified coagulation defects: Secondary | ICD-10-CM | POA: Diagnosis not present

## 2020-05-08 DIAGNOSIS — Z992 Dependence on renal dialysis: Secondary | ICD-10-CM | POA: Diagnosis not present

## 2020-05-10 DIAGNOSIS — Z992 Dependence on renal dialysis: Secondary | ICD-10-CM | POA: Diagnosis not present

## 2020-05-10 DIAGNOSIS — N186 End stage renal disease: Secondary | ICD-10-CM | POA: Diagnosis not present

## 2020-05-10 DIAGNOSIS — D688 Other specified coagulation defects: Secondary | ICD-10-CM | POA: Diagnosis not present

## 2020-05-10 DIAGNOSIS — N2581 Secondary hyperparathyroidism of renal origin: Secondary | ICD-10-CM | POA: Diagnosis not present

## 2020-05-13 DIAGNOSIS — N2581 Secondary hyperparathyroidism of renal origin: Secondary | ICD-10-CM | POA: Diagnosis not present

## 2020-05-13 DIAGNOSIS — D631 Anemia in chronic kidney disease: Secondary | ICD-10-CM | POA: Diagnosis not present

## 2020-05-13 DIAGNOSIS — D688 Other specified coagulation defects: Secondary | ICD-10-CM | POA: Diagnosis not present

## 2020-05-13 DIAGNOSIS — Z992 Dependence on renal dialysis: Secondary | ICD-10-CM | POA: Diagnosis not present

## 2020-05-13 DIAGNOSIS — N186 End stage renal disease: Secondary | ICD-10-CM | POA: Diagnosis not present

## 2020-05-15 DIAGNOSIS — Z992 Dependence on renal dialysis: Secondary | ICD-10-CM | POA: Diagnosis not present

## 2020-05-15 DIAGNOSIS — N186 End stage renal disease: Secondary | ICD-10-CM | POA: Diagnosis not present

## 2020-05-15 DIAGNOSIS — D688 Other specified coagulation defects: Secondary | ICD-10-CM | POA: Diagnosis not present

## 2020-05-15 DIAGNOSIS — D631 Anemia in chronic kidney disease: Secondary | ICD-10-CM | POA: Diagnosis not present

## 2020-05-15 DIAGNOSIS — N2581 Secondary hyperparathyroidism of renal origin: Secondary | ICD-10-CM | POA: Diagnosis not present

## 2020-05-17 DIAGNOSIS — Z992 Dependence on renal dialysis: Secondary | ICD-10-CM | POA: Diagnosis not present

## 2020-05-17 DIAGNOSIS — N186 End stage renal disease: Secondary | ICD-10-CM | POA: Diagnosis not present

## 2020-05-17 DIAGNOSIS — D688 Other specified coagulation defects: Secondary | ICD-10-CM | POA: Diagnosis not present

## 2020-05-17 DIAGNOSIS — N2581 Secondary hyperparathyroidism of renal origin: Secondary | ICD-10-CM | POA: Diagnosis not present

## 2020-05-17 DIAGNOSIS — D631 Anemia in chronic kidney disease: Secondary | ICD-10-CM | POA: Diagnosis not present

## 2020-05-20 DIAGNOSIS — N186 End stage renal disease: Secondary | ICD-10-CM | POA: Diagnosis not present

## 2020-05-20 DIAGNOSIS — N2581 Secondary hyperparathyroidism of renal origin: Secondary | ICD-10-CM | POA: Diagnosis not present

## 2020-05-20 DIAGNOSIS — Z992 Dependence on renal dialysis: Secondary | ICD-10-CM | POA: Diagnosis not present

## 2020-05-20 DIAGNOSIS — D688 Other specified coagulation defects: Secondary | ICD-10-CM | POA: Diagnosis not present

## 2020-05-22 DIAGNOSIS — N2581 Secondary hyperparathyroidism of renal origin: Secondary | ICD-10-CM | POA: Diagnosis not present

## 2020-05-22 DIAGNOSIS — D688 Other specified coagulation defects: Secondary | ICD-10-CM | POA: Diagnosis not present

## 2020-05-22 DIAGNOSIS — Z992 Dependence on renal dialysis: Secondary | ICD-10-CM | POA: Diagnosis not present

## 2020-05-22 DIAGNOSIS — N186 End stage renal disease: Secondary | ICD-10-CM | POA: Diagnosis not present

## 2020-05-24 DIAGNOSIS — N2581 Secondary hyperparathyroidism of renal origin: Secondary | ICD-10-CM | POA: Diagnosis not present

## 2020-05-24 DIAGNOSIS — Z992 Dependence on renal dialysis: Secondary | ICD-10-CM | POA: Diagnosis not present

## 2020-05-24 DIAGNOSIS — D688 Other specified coagulation defects: Secondary | ICD-10-CM | POA: Diagnosis not present

## 2020-05-24 DIAGNOSIS — N186 End stage renal disease: Secondary | ICD-10-CM | POA: Diagnosis not present

## 2020-05-27 DIAGNOSIS — D688 Other specified coagulation defects: Secondary | ICD-10-CM | POA: Diagnosis not present

## 2020-05-27 DIAGNOSIS — Z992 Dependence on renal dialysis: Secondary | ICD-10-CM | POA: Diagnosis not present

## 2020-05-27 DIAGNOSIS — N186 End stage renal disease: Secondary | ICD-10-CM | POA: Diagnosis not present

## 2020-05-27 DIAGNOSIS — N2581 Secondary hyperparathyroidism of renal origin: Secondary | ICD-10-CM | POA: Diagnosis not present

## 2020-05-29 DIAGNOSIS — N2581 Secondary hyperparathyroidism of renal origin: Secondary | ICD-10-CM | POA: Diagnosis not present

## 2020-05-29 DIAGNOSIS — Z992 Dependence on renal dialysis: Secondary | ICD-10-CM | POA: Diagnosis not present

## 2020-05-29 DIAGNOSIS — N186 End stage renal disease: Secondary | ICD-10-CM | POA: Diagnosis not present

## 2020-05-29 DIAGNOSIS — D688 Other specified coagulation defects: Secondary | ICD-10-CM | POA: Diagnosis not present

## 2020-05-31 DIAGNOSIS — Z992 Dependence on renal dialysis: Secondary | ICD-10-CM | POA: Diagnosis not present

## 2020-05-31 DIAGNOSIS — D688 Other specified coagulation defects: Secondary | ICD-10-CM | POA: Diagnosis not present

## 2020-05-31 DIAGNOSIS — N186 End stage renal disease: Secondary | ICD-10-CM | POA: Diagnosis not present

## 2020-05-31 DIAGNOSIS — N2581 Secondary hyperparathyroidism of renal origin: Secondary | ICD-10-CM | POA: Diagnosis not present

## 2020-06-03 DIAGNOSIS — N2581 Secondary hyperparathyroidism of renal origin: Secondary | ICD-10-CM | POA: Diagnosis not present

## 2020-06-03 DIAGNOSIS — D688 Other specified coagulation defects: Secondary | ICD-10-CM | POA: Diagnosis not present

## 2020-06-03 DIAGNOSIS — N019 Rapidly progressive nephritic syndrome with unspecified morphologic changes: Secondary | ICD-10-CM | POA: Diagnosis not present

## 2020-06-03 DIAGNOSIS — N186 End stage renal disease: Secondary | ICD-10-CM | POA: Diagnosis not present

## 2020-06-03 DIAGNOSIS — Z992 Dependence on renal dialysis: Secondary | ICD-10-CM | POA: Diagnosis not present

## 2020-06-05 DIAGNOSIS — D688 Other specified coagulation defects: Secondary | ICD-10-CM | POA: Diagnosis not present

## 2020-06-05 DIAGNOSIS — N186 End stage renal disease: Secondary | ICD-10-CM | POA: Diagnosis not present

## 2020-06-05 DIAGNOSIS — N2581 Secondary hyperparathyroidism of renal origin: Secondary | ICD-10-CM | POA: Diagnosis not present

## 2020-06-05 DIAGNOSIS — Z992 Dependence on renal dialysis: Secondary | ICD-10-CM | POA: Diagnosis not present

## 2020-06-07 DIAGNOSIS — Z992 Dependence on renal dialysis: Secondary | ICD-10-CM | POA: Diagnosis not present

## 2020-06-07 DIAGNOSIS — N186 End stage renal disease: Secondary | ICD-10-CM | POA: Diagnosis not present

## 2020-06-07 DIAGNOSIS — D688 Other specified coagulation defects: Secondary | ICD-10-CM | POA: Diagnosis not present

## 2020-06-07 DIAGNOSIS — N2581 Secondary hyperparathyroidism of renal origin: Secondary | ICD-10-CM | POA: Diagnosis not present

## 2020-06-10 DIAGNOSIS — Z992 Dependence on renal dialysis: Secondary | ICD-10-CM | POA: Diagnosis not present

## 2020-06-10 DIAGNOSIS — D688 Other specified coagulation defects: Secondary | ICD-10-CM | POA: Diagnosis not present

## 2020-06-10 DIAGNOSIS — N2581 Secondary hyperparathyroidism of renal origin: Secondary | ICD-10-CM | POA: Diagnosis not present

## 2020-06-10 DIAGNOSIS — N186 End stage renal disease: Secondary | ICD-10-CM | POA: Diagnosis not present

## 2020-06-12 DIAGNOSIS — N186 End stage renal disease: Secondary | ICD-10-CM | POA: Diagnosis not present

## 2020-06-12 DIAGNOSIS — D688 Other specified coagulation defects: Secondary | ICD-10-CM | POA: Diagnosis not present

## 2020-06-12 DIAGNOSIS — N2581 Secondary hyperparathyroidism of renal origin: Secondary | ICD-10-CM | POA: Diagnosis not present

## 2020-06-12 DIAGNOSIS — Z992 Dependence on renal dialysis: Secondary | ICD-10-CM | POA: Diagnosis not present

## 2020-06-14 DIAGNOSIS — N186 End stage renal disease: Secondary | ICD-10-CM | POA: Diagnosis not present

## 2020-06-14 DIAGNOSIS — D688 Other specified coagulation defects: Secondary | ICD-10-CM | POA: Diagnosis not present

## 2020-06-14 DIAGNOSIS — N2581 Secondary hyperparathyroidism of renal origin: Secondary | ICD-10-CM | POA: Diagnosis not present

## 2020-06-14 DIAGNOSIS — Z992 Dependence on renal dialysis: Secondary | ICD-10-CM | POA: Diagnosis not present

## 2020-06-17 DIAGNOSIS — D688 Other specified coagulation defects: Secondary | ICD-10-CM | POA: Diagnosis not present

## 2020-06-17 DIAGNOSIS — N186 End stage renal disease: Secondary | ICD-10-CM | POA: Diagnosis not present

## 2020-06-17 DIAGNOSIS — Z992 Dependence on renal dialysis: Secondary | ICD-10-CM | POA: Diagnosis not present

## 2020-06-17 DIAGNOSIS — N2581 Secondary hyperparathyroidism of renal origin: Secondary | ICD-10-CM | POA: Diagnosis not present

## 2020-06-19 DIAGNOSIS — D688 Other specified coagulation defects: Secondary | ICD-10-CM | POA: Diagnosis not present

## 2020-06-19 DIAGNOSIS — N2581 Secondary hyperparathyroidism of renal origin: Secondary | ICD-10-CM | POA: Diagnosis not present

## 2020-06-19 DIAGNOSIS — N186 End stage renal disease: Secondary | ICD-10-CM | POA: Diagnosis not present

## 2020-06-19 DIAGNOSIS — Z992 Dependence on renal dialysis: Secondary | ICD-10-CM | POA: Diagnosis not present

## 2020-06-21 DIAGNOSIS — N2581 Secondary hyperparathyroidism of renal origin: Secondary | ICD-10-CM | POA: Diagnosis not present

## 2020-06-21 DIAGNOSIS — D688 Other specified coagulation defects: Secondary | ICD-10-CM | POA: Diagnosis not present

## 2020-06-21 DIAGNOSIS — Z992 Dependence on renal dialysis: Secondary | ICD-10-CM | POA: Diagnosis not present

## 2020-06-21 DIAGNOSIS — N186 End stage renal disease: Secondary | ICD-10-CM | POA: Diagnosis not present

## 2020-06-24 DIAGNOSIS — Z992 Dependence on renal dialysis: Secondary | ICD-10-CM | POA: Diagnosis not present

## 2020-06-24 DIAGNOSIS — D688 Other specified coagulation defects: Secondary | ICD-10-CM | POA: Diagnosis not present

## 2020-06-24 DIAGNOSIS — N2581 Secondary hyperparathyroidism of renal origin: Secondary | ICD-10-CM | POA: Diagnosis not present

## 2020-06-24 DIAGNOSIS — N186 End stage renal disease: Secondary | ICD-10-CM | POA: Diagnosis not present

## 2020-06-26 DIAGNOSIS — N186 End stage renal disease: Secondary | ICD-10-CM | POA: Diagnosis not present

## 2020-06-26 DIAGNOSIS — Z992 Dependence on renal dialysis: Secondary | ICD-10-CM | POA: Diagnosis not present

## 2020-06-26 DIAGNOSIS — D688 Other specified coagulation defects: Secondary | ICD-10-CM | POA: Diagnosis not present

## 2020-06-26 DIAGNOSIS — N2581 Secondary hyperparathyroidism of renal origin: Secondary | ICD-10-CM | POA: Diagnosis not present

## 2020-06-26 DIAGNOSIS — Z7682 Awaiting organ transplant status: Secondary | ICD-10-CM | POA: Diagnosis not present

## 2020-06-28 DIAGNOSIS — D688 Other specified coagulation defects: Secondary | ICD-10-CM | POA: Diagnosis not present

## 2020-06-28 DIAGNOSIS — N2581 Secondary hyperparathyroidism of renal origin: Secondary | ICD-10-CM | POA: Diagnosis not present

## 2020-06-28 DIAGNOSIS — N186 End stage renal disease: Secondary | ICD-10-CM | POA: Diagnosis not present

## 2020-06-28 DIAGNOSIS — Z992 Dependence on renal dialysis: Secondary | ICD-10-CM | POA: Diagnosis not present

## 2020-07-01 DIAGNOSIS — N2581 Secondary hyperparathyroidism of renal origin: Secondary | ICD-10-CM | POA: Diagnosis not present

## 2020-07-01 DIAGNOSIS — D631 Anemia in chronic kidney disease: Secondary | ICD-10-CM | POA: Diagnosis not present

## 2020-07-01 DIAGNOSIS — Z992 Dependence on renal dialysis: Secondary | ICD-10-CM | POA: Diagnosis not present

## 2020-07-01 DIAGNOSIS — D688 Other specified coagulation defects: Secondary | ICD-10-CM | POA: Diagnosis not present

## 2020-07-01 DIAGNOSIS — N186 End stage renal disease: Secondary | ICD-10-CM | POA: Diagnosis not present

## 2020-07-03 DIAGNOSIS — N186 End stage renal disease: Secondary | ICD-10-CM | POA: Diagnosis not present

## 2020-07-03 DIAGNOSIS — Z992 Dependence on renal dialysis: Secondary | ICD-10-CM | POA: Diagnosis not present

## 2020-07-03 DIAGNOSIS — N019 Rapidly progressive nephritic syndrome with unspecified morphologic changes: Secondary | ICD-10-CM | POA: Diagnosis not present

## 2020-07-03 DIAGNOSIS — D688 Other specified coagulation defects: Secondary | ICD-10-CM | POA: Diagnosis not present

## 2020-07-03 DIAGNOSIS — D631 Anemia in chronic kidney disease: Secondary | ICD-10-CM | POA: Diagnosis not present

## 2020-07-03 DIAGNOSIS — N2581 Secondary hyperparathyroidism of renal origin: Secondary | ICD-10-CM | POA: Diagnosis not present

## 2020-07-05 DIAGNOSIS — N186 End stage renal disease: Secondary | ICD-10-CM | POA: Diagnosis not present

## 2020-07-05 DIAGNOSIS — Z992 Dependence on renal dialysis: Secondary | ICD-10-CM | POA: Diagnosis not present

## 2020-07-05 DIAGNOSIS — N2581 Secondary hyperparathyroidism of renal origin: Secondary | ICD-10-CM | POA: Diagnosis not present

## 2020-07-05 DIAGNOSIS — D688 Other specified coagulation defects: Secondary | ICD-10-CM | POA: Diagnosis not present

## 2020-07-08 DIAGNOSIS — N2581 Secondary hyperparathyroidism of renal origin: Secondary | ICD-10-CM | POA: Diagnosis not present

## 2020-07-08 DIAGNOSIS — N186 End stage renal disease: Secondary | ICD-10-CM | POA: Diagnosis not present

## 2020-07-08 DIAGNOSIS — Z992 Dependence on renal dialysis: Secondary | ICD-10-CM | POA: Diagnosis not present

## 2020-07-08 DIAGNOSIS — D688 Other specified coagulation defects: Secondary | ICD-10-CM | POA: Diagnosis not present

## 2020-07-10 DIAGNOSIS — N186 End stage renal disease: Secondary | ICD-10-CM | POA: Diagnosis not present

## 2020-07-10 DIAGNOSIS — Z992 Dependence on renal dialysis: Secondary | ICD-10-CM | POA: Diagnosis not present

## 2020-07-10 DIAGNOSIS — D688 Other specified coagulation defects: Secondary | ICD-10-CM | POA: Diagnosis not present

## 2020-07-10 DIAGNOSIS — N2581 Secondary hyperparathyroidism of renal origin: Secondary | ICD-10-CM | POA: Diagnosis not present

## 2020-07-12 DIAGNOSIS — N2581 Secondary hyperparathyroidism of renal origin: Secondary | ICD-10-CM | POA: Diagnosis not present

## 2020-07-12 DIAGNOSIS — N186 End stage renal disease: Secondary | ICD-10-CM | POA: Diagnosis not present

## 2020-07-12 DIAGNOSIS — D688 Other specified coagulation defects: Secondary | ICD-10-CM | POA: Diagnosis not present

## 2020-07-12 DIAGNOSIS — Z992 Dependence on renal dialysis: Secondary | ICD-10-CM | POA: Diagnosis not present

## 2020-07-15 DIAGNOSIS — D688 Other specified coagulation defects: Secondary | ICD-10-CM | POA: Diagnosis not present

## 2020-07-15 DIAGNOSIS — N2581 Secondary hyperparathyroidism of renal origin: Secondary | ICD-10-CM | POA: Diagnosis not present

## 2020-07-15 DIAGNOSIS — Z992 Dependence on renal dialysis: Secondary | ICD-10-CM | POA: Diagnosis not present

## 2020-07-15 DIAGNOSIS — N186 End stage renal disease: Secondary | ICD-10-CM | POA: Diagnosis not present

## 2020-07-15 DIAGNOSIS — D631 Anemia in chronic kidney disease: Secondary | ICD-10-CM | POA: Diagnosis not present

## 2020-07-17 DIAGNOSIS — D631 Anemia in chronic kidney disease: Secondary | ICD-10-CM | POA: Diagnosis not present

## 2020-07-17 DIAGNOSIS — N186 End stage renal disease: Secondary | ICD-10-CM | POA: Diagnosis not present

## 2020-07-17 DIAGNOSIS — D688 Other specified coagulation defects: Secondary | ICD-10-CM | POA: Diagnosis not present

## 2020-07-17 DIAGNOSIS — Z992 Dependence on renal dialysis: Secondary | ICD-10-CM | POA: Diagnosis not present

## 2020-07-17 DIAGNOSIS — N2581 Secondary hyperparathyroidism of renal origin: Secondary | ICD-10-CM | POA: Diagnosis not present

## 2020-07-18 DIAGNOSIS — M9903 Segmental and somatic dysfunction of lumbar region: Secondary | ICD-10-CM | POA: Diagnosis not present

## 2020-07-18 DIAGNOSIS — M9901 Segmental and somatic dysfunction of cervical region: Secondary | ICD-10-CM | POA: Diagnosis not present

## 2020-07-18 DIAGNOSIS — M5412 Radiculopathy, cervical region: Secondary | ICD-10-CM | POA: Diagnosis not present

## 2020-07-18 DIAGNOSIS — M62830 Muscle spasm of back: Secondary | ICD-10-CM | POA: Diagnosis not present

## 2020-07-18 DIAGNOSIS — M5441 Lumbago with sciatica, right side: Secondary | ICD-10-CM | POA: Diagnosis not present

## 2020-07-18 DIAGNOSIS — M9902 Segmental and somatic dysfunction of thoracic region: Secondary | ICD-10-CM | POA: Diagnosis not present

## 2020-07-19 DIAGNOSIS — N2581 Secondary hyperparathyroidism of renal origin: Secondary | ICD-10-CM | POA: Diagnosis not present

## 2020-07-19 DIAGNOSIS — N186 End stage renal disease: Secondary | ICD-10-CM | POA: Diagnosis not present

## 2020-07-19 DIAGNOSIS — D688 Other specified coagulation defects: Secondary | ICD-10-CM | POA: Diagnosis not present

## 2020-07-19 DIAGNOSIS — Z992 Dependence on renal dialysis: Secondary | ICD-10-CM | POA: Diagnosis not present

## 2020-07-19 DIAGNOSIS — D631 Anemia in chronic kidney disease: Secondary | ICD-10-CM | POA: Diagnosis not present

## 2020-07-21 DIAGNOSIS — M62830 Muscle spasm of back: Secondary | ICD-10-CM | POA: Diagnosis not present

## 2020-07-21 DIAGNOSIS — M9903 Segmental and somatic dysfunction of lumbar region: Secondary | ICD-10-CM | POA: Diagnosis not present

## 2020-07-21 DIAGNOSIS — M9901 Segmental and somatic dysfunction of cervical region: Secondary | ICD-10-CM | POA: Diagnosis not present

## 2020-07-21 DIAGNOSIS — M9902 Segmental and somatic dysfunction of thoracic region: Secondary | ICD-10-CM | POA: Diagnosis not present

## 2020-07-21 DIAGNOSIS — M5441 Lumbago with sciatica, right side: Secondary | ICD-10-CM | POA: Diagnosis not present

## 2020-07-21 DIAGNOSIS — M5412 Radiculopathy, cervical region: Secondary | ICD-10-CM | POA: Diagnosis not present

## 2020-07-22 DIAGNOSIS — Z992 Dependence on renal dialysis: Secondary | ICD-10-CM | POA: Diagnosis not present

## 2020-07-22 DIAGNOSIS — D688 Other specified coagulation defects: Secondary | ICD-10-CM | POA: Diagnosis not present

## 2020-07-22 DIAGNOSIS — N2581 Secondary hyperparathyroidism of renal origin: Secondary | ICD-10-CM | POA: Diagnosis not present

## 2020-07-22 DIAGNOSIS — N186 End stage renal disease: Secondary | ICD-10-CM | POA: Diagnosis not present

## 2020-07-23 DIAGNOSIS — M5412 Radiculopathy, cervical region: Secondary | ICD-10-CM | POA: Diagnosis not present

## 2020-07-23 DIAGNOSIS — M5441 Lumbago with sciatica, right side: Secondary | ICD-10-CM | POA: Diagnosis not present

## 2020-07-23 DIAGNOSIS — M9901 Segmental and somatic dysfunction of cervical region: Secondary | ICD-10-CM | POA: Diagnosis not present

## 2020-07-23 DIAGNOSIS — M9903 Segmental and somatic dysfunction of lumbar region: Secondary | ICD-10-CM | POA: Diagnosis not present

## 2020-07-23 DIAGNOSIS — M62830 Muscle spasm of back: Secondary | ICD-10-CM | POA: Diagnosis not present

## 2020-07-23 DIAGNOSIS — M9902 Segmental and somatic dysfunction of thoracic region: Secondary | ICD-10-CM | POA: Diagnosis not present

## 2020-07-24 DIAGNOSIS — D688 Other specified coagulation defects: Secondary | ICD-10-CM | POA: Diagnosis not present

## 2020-07-24 DIAGNOSIS — Z992 Dependence on renal dialysis: Secondary | ICD-10-CM | POA: Diagnosis not present

## 2020-07-24 DIAGNOSIS — N2581 Secondary hyperparathyroidism of renal origin: Secondary | ICD-10-CM | POA: Diagnosis not present

## 2020-07-24 DIAGNOSIS — N186 End stage renal disease: Secondary | ICD-10-CM | POA: Diagnosis not present

## 2020-07-25 DIAGNOSIS — M9902 Segmental and somatic dysfunction of thoracic region: Secondary | ICD-10-CM | POA: Diagnosis not present

## 2020-07-25 DIAGNOSIS — M62830 Muscle spasm of back: Secondary | ICD-10-CM | POA: Diagnosis not present

## 2020-07-25 DIAGNOSIS — M5412 Radiculopathy, cervical region: Secondary | ICD-10-CM | POA: Diagnosis not present

## 2020-07-25 DIAGNOSIS — M9903 Segmental and somatic dysfunction of lumbar region: Secondary | ICD-10-CM | POA: Diagnosis not present

## 2020-07-25 DIAGNOSIS — M5441 Lumbago with sciatica, right side: Secondary | ICD-10-CM | POA: Diagnosis not present

## 2020-07-25 DIAGNOSIS — M9901 Segmental and somatic dysfunction of cervical region: Secondary | ICD-10-CM | POA: Diagnosis not present

## 2020-07-26 DIAGNOSIS — N2581 Secondary hyperparathyroidism of renal origin: Secondary | ICD-10-CM | POA: Diagnosis not present

## 2020-07-26 DIAGNOSIS — Z992 Dependence on renal dialysis: Secondary | ICD-10-CM | POA: Diagnosis not present

## 2020-07-26 DIAGNOSIS — D688 Other specified coagulation defects: Secondary | ICD-10-CM | POA: Diagnosis not present

## 2020-07-26 DIAGNOSIS — N186 End stage renal disease: Secondary | ICD-10-CM | POA: Diagnosis not present

## 2020-07-28 DIAGNOSIS — M5441 Lumbago with sciatica, right side: Secondary | ICD-10-CM | POA: Diagnosis not present

## 2020-07-28 DIAGNOSIS — M9901 Segmental and somatic dysfunction of cervical region: Secondary | ICD-10-CM | POA: Diagnosis not present

## 2020-07-28 DIAGNOSIS — M5412 Radiculopathy, cervical region: Secondary | ICD-10-CM | POA: Diagnosis not present

## 2020-07-28 DIAGNOSIS — M9903 Segmental and somatic dysfunction of lumbar region: Secondary | ICD-10-CM | POA: Diagnosis not present

## 2020-07-28 DIAGNOSIS — M62830 Muscle spasm of back: Secondary | ICD-10-CM | POA: Diagnosis not present

## 2020-07-28 DIAGNOSIS — M9902 Segmental and somatic dysfunction of thoracic region: Secondary | ICD-10-CM | POA: Diagnosis not present

## 2020-07-29 DIAGNOSIS — Z992 Dependence on renal dialysis: Secondary | ICD-10-CM | POA: Diagnosis not present

## 2020-07-29 DIAGNOSIS — D688 Other specified coagulation defects: Secondary | ICD-10-CM | POA: Diagnosis not present

## 2020-07-29 DIAGNOSIS — N2581 Secondary hyperparathyroidism of renal origin: Secondary | ICD-10-CM | POA: Diagnosis not present

## 2020-07-29 DIAGNOSIS — N186 End stage renal disease: Secondary | ICD-10-CM | POA: Diagnosis not present

## 2020-07-30 DIAGNOSIS — M9901 Segmental and somatic dysfunction of cervical region: Secondary | ICD-10-CM | POA: Diagnosis not present

## 2020-07-30 DIAGNOSIS — M9903 Segmental and somatic dysfunction of lumbar region: Secondary | ICD-10-CM | POA: Diagnosis not present

## 2020-07-30 DIAGNOSIS — M62830 Muscle spasm of back: Secondary | ICD-10-CM | POA: Diagnosis not present

## 2020-07-30 DIAGNOSIS — M5412 Radiculopathy, cervical region: Secondary | ICD-10-CM | POA: Diagnosis not present

## 2020-07-30 DIAGNOSIS — M5441 Lumbago with sciatica, right side: Secondary | ICD-10-CM | POA: Diagnosis not present

## 2020-07-30 DIAGNOSIS — M9902 Segmental and somatic dysfunction of thoracic region: Secondary | ICD-10-CM | POA: Diagnosis not present

## 2020-07-31 DIAGNOSIS — N186 End stage renal disease: Secondary | ICD-10-CM | POA: Diagnosis not present

## 2020-07-31 DIAGNOSIS — D688 Other specified coagulation defects: Secondary | ICD-10-CM | POA: Diagnosis not present

## 2020-07-31 DIAGNOSIS — Z992 Dependence on renal dialysis: Secondary | ICD-10-CM | POA: Diagnosis not present

## 2020-07-31 DIAGNOSIS — N2581 Secondary hyperparathyroidism of renal origin: Secondary | ICD-10-CM | POA: Diagnosis not present

## 2020-08-01 DIAGNOSIS — M9903 Segmental and somatic dysfunction of lumbar region: Secondary | ICD-10-CM | POA: Diagnosis not present

## 2020-08-01 DIAGNOSIS — M5412 Radiculopathy, cervical region: Secondary | ICD-10-CM | POA: Diagnosis not present

## 2020-08-01 DIAGNOSIS — M62830 Muscle spasm of back: Secondary | ICD-10-CM | POA: Diagnosis not present

## 2020-08-01 DIAGNOSIS — M9902 Segmental and somatic dysfunction of thoracic region: Secondary | ICD-10-CM | POA: Diagnosis not present

## 2020-08-01 DIAGNOSIS — M9901 Segmental and somatic dysfunction of cervical region: Secondary | ICD-10-CM | POA: Diagnosis not present

## 2020-08-01 DIAGNOSIS — M5441 Lumbago with sciatica, right side: Secondary | ICD-10-CM | POA: Diagnosis not present

## 2020-08-02 DIAGNOSIS — Z992 Dependence on renal dialysis: Secondary | ICD-10-CM | POA: Diagnosis not present

## 2020-08-02 DIAGNOSIS — N2581 Secondary hyperparathyroidism of renal origin: Secondary | ICD-10-CM | POA: Diagnosis not present

## 2020-08-02 DIAGNOSIS — D688 Other specified coagulation defects: Secondary | ICD-10-CM | POA: Diagnosis not present

## 2020-08-02 DIAGNOSIS — N186 End stage renal disease: Secondary | ICD-10-CM | POA: Diagnosis not present

## 2020-08-03 DIAGNOSIS — N186 End stage renal disease: Secondary | ICD-10-CM | POA: Diagnosis not present

## 2020-08-03 DIAGNOSIS — N019 Rapidly progressive nephritic syndrome with unspecified morphologic changes: Secondary | ICD-10-CM | POA: Diagnosis not present

## 2020-08-03 DIAGNOSIS — Z992 Dependence on renal dialysis: Secondary | ICD-10-CM | POA: Diagnosis not present

## 2020-08-04 DIAGNOSIS — M9901 Segmental and somatic dysfunction of cervical region: Secondary | ICD-10-CM | POA: Diagnosis not present

## 2020-08-04 DIAGNOSIS — M5412 Radiculopathy, cervical region: Secondary | ICD-10-CM | POA: Diagnosis not present

## 2020-08-04 DIAGNOSIS — M5441 Lumbago with sciatica, right side: Secondary | ICD-10-CM | POA: Diagnosis not present

## 2020-08-04 DIAGNOSIS — M62830 Muscle spasm of back: Secondary | ICD-10-CM | POA: Diagnosis not present

## 2020-08-04 DIAGNOSIS — M9902 Segmental and somatic dysfunction of thoracic region: Secondary | ICD-10-CM | POA: Diagnosis not present

## 2020-08-04 DIAGNOSIS — M9903 Segmental and somatic dysfunction of lumbar region: Secondary | ICD-10-CM | POA: Diagnosis not present

## 2020-08-05 DIAGNOSIS — N2581 Secondary hyperparathyroidism of renal origin: Secondary | ICD-10-CM | POA: Diagnosis not present

## 2020-08-05 DIAGNOSIS — D688 Other specified coagulation defects: Secondary | ICD-10-CM | POA: Diagnosis not present

## 2020-08-05 DIAGNOSIS — Z992 Dependence on renal dialysis: Secondary | ICD-10-CM | POA: Diagnosis not present

## 2020-08-05 DIAGNOSIS — N186 End stage renal disease: Secondary | ICD-10-CM | POA: Diagnosis not present

## 2020-08-06 DIAGNOSIS — M9901 Segmental and somatic dysfunction of cervical region: Secondary | ICD-10-CM | POA: Diagnosis not present

## 2020-08-06 DIAGNOSIS — M5441 Lumbago with sciatica, right side: Secondary | ICD-10-CM | POA: Diagnosis not present

## 2020-08-06 DIAGNOSIS — M9903 Segmental and somatic dysfunction of lumbar region: Secondary | ICD-10-CM | POA: Diagnosis not present

## 2020-08-06 DIAGNOSIS — M62830 Muscle spasm of back: Secondary | ICD-10-CM | POA: Diagnosis not present

## 2020-08-06 DIAGNOSIS — M9902 Segmental and somatic dysfunction of thoracic region: Secondary | ICD-10-CM | POA: Diagnosis not present

## 2020-08-06 DIAGNOSIS — M5412 Radiculopathy, cervical region: Secondary | ICD-10-CM | POA: Diagnosis not present

## 2020-08-07 DIAGNOSIS — D688 Other specified coagulation defects: Secondary | ICD-10-CM | POA: Diagnosis not present

## 2020-08-07 DIAGNOSIS — Z992 Dependence on renal dialysis: Secondary | ICD-10-CM | POA: Diagnosis not present

## 2020-08-07 DIAGNOSIS — N186 End stage renal disease: Secondary | ICD-10-CM | POA: Diagnosis not present

## 2020-08-07 DIAGNOSIS — N2581 Secondary hyperparathyroidism of renal origin: Secondary | ICD-10-CM | POA: Diagnosis not present

## 2020-08-08 DIAGNOSIS — M5412 Radiculopathy, cervical region: Secondary | ICD-10-CM | POA: Diagnosis not present

## 2020-08-08 DIAGNOSIS — M9903 Segmental and somatic dysfunction of lumbar region: Secondary | ICD-10-CM | POA: Diagnosis not present

## 2020-08-08 DIAGNOSIS — M62830 Muscle spasm of back: Secondary | ICD-10-CM | POA: Diagnosis not present

## 2020-08-08 DIAGNOSIS — M5441 Lumbago with sciatica, right side: Secondary | ICD-10-CM | POA: Diagnosis not present

## 2020-08-08 DIAGNOSIS — M9901 Segmental and somatic dysfunction of cervical region: Secondary | ICD-10-CM | POA: Diagnosis not present

## 2020-08-08 DIAGNOSIS — M9902 Segmental and somatic dysfunction of thoracic region: Secondary | ICD-10-CM | POA: Diagnosis not present

## 2020-08-09 DIAGNOSIS — D688 Other specified coagulation defects: Secondary | ICD-10-CM | POA: Diagnosis not present

## 2020-08-09 DIAGNOSIS — Z992 Dependence on renal dialysis: Secondary | ICD-10-CM | POA: Diagnosis not present

## 2020-08-09 DIAGNOSIS — N2581 Secondary hyperparathyroidism of renal origin: Secondary | ICD-10-CM | POA: Diagnosis not present

## 2020-08-09 DIAGNOSIS — N186 End stage renal disease: Secondary | ICD-10-CM | POA: Diagnosis not present

## 2020-08-11 DIAGNOSIS — M9903 Segmental and somatic dysfunction of lumbar region: Secondary | ICD-10-CM | POA: Diagnosis not present

## 2020-08-11 DIAGNOSIS — M5441 Lumbago with sciatica, right side: Secondary | ICD-10-CM | POA: Diagnosis not present

## 2020-08-11 DIAGNOSIS — M9902 Segmental and somatic dysfunction of thoracic region: Secondary | ICD-10-CM | POA: Diagnosis not present

## 2020-08-11 DIAGNOSIS — M5412 Radiculopathy, cervical region: Secondary | ICD-10-CM | POA: Diagnosis not present

## 2020-08-11 DIAGNOSIS — M62830 Muscle spasm of back: Secondary | ICD-10-CM | POA: Diagnosis not present

## 2020-08-11 DIAGNOSIS — M9901 Segmental and somatic dysfunction of cervical region: Secondary | ICD-10-CM | POA: Diagnosis not present

## 2020-08-12 DIAGNOSIS — N2581 Secondary hyperparathyroidism of renal origin: Secondary | ICD-10-CM | POA: Diagnosis not present

## 2020-08-12 DIAGNOSIS — Z992 Dependence on renal dialysis: Secondary | ICD-10-CM | POA: Diagnosis not present

## 2020-08-12 DIAGNOSIS — D688 Other specified coagulation defects: Secondary | ICD-10-CM | POA: Diagnosis not present

## 2020-08-12 DIAGNOSIS — N186 End stage renal disease: Secondary | ICD-10-CM | POA: Diagnosis not present

## 2020-08-13 DIAGNOSIS — I129 Hypertensive chronic kidney disease with stage 1 through stage 4 chronic kidney disease, or unspecified chronic kidney disease: Secondary | ICD-10-CM | POA: Diagnosis not present

## 2020-08-13 DIAGNOSIS — Z992 Dependence on renal dialysis: Secondary | ICD-10-CM | POA: Diagnosis not present

## 2020-08-13 DIAGNOSIS — N186 End stage renal disease: Secondary | ICD-10-CM | POA: Diagnosis not present

## 2020-08-14 DIAGNOSIS — N2581 Secondary hyperparathyroidism of renal origin: Secondary | ICD-10-CM | POA: Diagnosis not present

## 2020-08-14 DIAGNOSIS — N186 End stage renal disease: Secondary | ICD-10-CM | POA: Diagnosis not present

## 2020-08-14 DIAGNOSIS — D688 Other specified coagulation defects: Secondary | ICD-10-CM | POA: Diagnosis not present

## 2020-08-14 DIAGNOSIS — Z992 Dependence on renal dialysis: Secondary | ICD-10-CM | POA: Diagnosis not present

## 2020-08-15 DIAGNOSIS — K219 Gastro-esophageal reflux disease without esophagitis: Secondary | ICD-10-CM | POA: Diagnosis not present

## 2020-08-15 DIAGNOSIS — S0181XA Laceration without foreign body of other part of head, initial encounter: Secondary | ICD-10-CM | POA: Diagnosis not present

## 2020-08-15 DIAGNOSIS — S81011A Laceration without foreign body, right knee, initial encounter: Secondary | ICD-10-CM | POA: Diagnosis not present

## 2020-08-15 DIAGNOSIS — Z87891 Personal history of nicotine dependence: Secondary | ICD-10-CM | POA: Diagnosis not present

## 2020-08-15 DIAGNOSIS — Z7682 Awaiting organ transplant status: Secondary | ICD-10-CM | POA: Diagnosis not present

## 2020-08-15 DIAGNOSIS — Z1159 Encounter for screening for other viral diseases: Secondary | ICD-10-CM | POA: Diagnosis not present

## 2020-08-15 DIAGNOSIS — Z01818 Encounter for other preprocedural examination: Secondary | ICD-10-CM | POA: Diagnosis not present

## 2020-08-15 DIAGNOSIS — Z114 Encounter for screening for human immunodeficiency virus [HIV]: Secondary | ICD-10-CM | POA: Diagnosis not present

## 2020-08-15 DIAGNOSIS — W010XXA Fall on same level from slipping, tripping and stumbling without subsequent striking against object, initial encounter: Secondary | ICD-10-CM | POA: Diagnosis not present

## 2020-08-15 DIAGNOSIS — Z01810 Encounter for preprocedural cardiovascular examination: Secondary | ICD-10-CM | POA: Diagnosis not present

## 2020-08-15 DIAGNOSIS — S6992XA Unspecified injury of left wrist, hand and finger(s), initial encounter: Secondary | ICD-10-CM | POA: Diagnosis not present

## 2020-08-15 DIAGNOSIS — I120 Hypertensive chronic kidney disease with stage 5 chronic kidney disease or end stage renal disease: Secondary | ICD-10-CM | POA: Diagnosis not present

## 2020-08-15 DIAGNOSIS — N186 End stage renal disease: Secondary | ICD-10-CM | POA: Diagnosis not present

## 2020-08-15 DIAGNOSIS — Z8711 Personal history of peptic ulcer disease: Secondary | ICD-10-CM | POA: Diagnosis not present

## 2020-08-15 DIAGNOSIS — R69 Illness, unspecified: Secondary | ICD-10-CM | POA: Diagnosis not present

## 2020-08-16 DIAGNOSIS — N186 End stage renal disease: Secondary | ICD-10-CM | POA: Diagnosis not present

## 2020-08-16 DIAGNOSIS — N2581 Secondary hyperparathyroidism of renal origin: Secondary | ICD-10-CM | POA: Diagnosis not present

## 2020-08-16 DIAGNOSIS — Z992 Dependence on renal dialysis: Secondary | ICD-10-CM | POA: Diagnosis not present

## 2020-08-16 DIAGNOSIS — D688 Other specified coagulation defects: Secondary | ICD-10-CM | POA: Diagnosis not present

## 2020-08-19 DIAGNOSIS — D688 Other specified coagulation defects: Secondary | ICD-10-CM | POA: Diagnosis not present

## 2020-08-19 DIAGNOSIS — N2581 Secondary hyperparathyroidism of renal origin: Secondary | ICD-10-CM | POA: Diagnosis not present

## 2020-08-19 DIAGNOSIS — N186 End stage renal disease: Secondary | ICD-10-CM | POA: Diagnosis not present

## 2020-08-19 DIAGNOSIS — Z992 Dependence on renal dialysis: Secondary | ICD-10-CM | POA: Diagnosis not present

## 2020-08-20 DIAGNOSIS — M25532 Pain in left wrist: Secondary | ICD-10-CM | POA: Diagnosis not present

## 2020-08-21 DIAGNOSIS — N186 End stage renal disease: Secondary | ICD-10-CM | POA: Diagnosis not present

## 2020-08-21 DIAGNOSIS — N2581 Secondary hyperparathyroidism of renal origin: Secondary | ICD-10-CM | POA: Diagnosis not present

## 2020-08-21 DIAGNOSIS — Z992 Dependence on renal dialysis: Secondary | ICD-10-CM | POA: Diagnosis not present

## 2020-08-21 DIAGNOSIS — D688 Other specified coagulation defects: Secondary | ICD-10-CM | POA: Diagnosis not present

## 2020-08-23 DIAGNOSIS — Z992 Dependence on renal dialysis: Secondary | ICD-10-CM | POA: Diagnosis not present

## 2020-08-23 DIAGNOSIS — N2581 Secondary hyperparathyroidism of renal origin: Secondary | ICD-10-CM | POA: Diagnosis not present

## 2020-08-23 DIAGNOSIS — N186 End stage renal disease: Secondary | ICD-10-CM | POA: Diagnosis not present

## 2020-08-23 DIAGNOSIS — D688 Other specified coagulation defects: Secondary | ICD-10-CM | POA: Diagnosis not present

## 2020-08-26 DIAGNOSIS — D631 Anemia in chronic kidney disease: Secondary | ICD-10-CM | POA: Diagnosis not present

## 2020-08-26 DIAGNOSIS — D688 Other specified coagulation defects: Secondary | ICD-10-CM | POA: Diagnosis not present

## 2020-08-26 DIAGNOSIS — N2581 Secondary hyperparathyroidism of renal origin: Secondary | ICD-10-CM | POA: Diagnosis not present

## 2020-08-26 DIAGNOSIS — Z992 Dependence on renal dialysis: Secondary | ICD-10-CM | POA: Diagnosis not present

## 2020-08-26 DIAGNOSIS — N186 End stage renal disease: Secondary | ICD-10-CM | POA: Diagnosis not present

## 2020-08-27 DIAGNOSIS — M9901 Segmental and somatic dysfunction of cervical region: Secondary | ICD-10-CM | POA: Diagnosis not present

## 2020-08-27 DIAGNOSIS — M5412 Radiculopathy, cervical region: Secondary | ICD-10-CM | POA: Diagnosis not present

## 2020-08-27 DIAGNOSIS — M9902 Segmental and somatic dysfunction of thoracic region: Secondary | ICD-10-CM | POA: Diagnosis not present

## 2020-08-27 DIAGNOSIS — M9903 Segmental and somatic dysfunction of lumbar region: Secondary | ICD-10-CM | POA: Diagnosis not present

## 2020-08-27 DIAGNOSIS — M5441 Lumbago with sciatica, right side: Secondary | ICD-10-CM | POA: Diagnosis not present

## 2020-08-27 DIAGNOSIS — M62830 Muscle spasm of back: Secondary | ICD-10-CM | POA: Diagnosis not present

## 2020-08-28 DIAGNOSIS — D688 Other specified coagulation defects: Secondary | ICD-10-CM | POA: Diagnosis not present

## 2020-08-28 DIAGNOSIS — Z992 Dependence on renal dialysis: Secondary | ICD-10-CM | POA: Diagnosis not present

## 2020-08-28 DIAGNOSIS — N2581 Secondary hyperparathyroidism of renal origin: Secondary | ICD-10-CM | POA: Diagnosis not present

## 2020-08-28 DIAGNOSIS — D631 Anemia in chronic kidney disease: Secondary | ICD-10-CM | POA: Diagnosis not present

## 2020-08-28 DIAGNOSIS — N186 End stage renal disease: Secondary | ICD-10-CM | POA: Diagnosis not present

## 2020-08-30 DIAGNOSIS — N186 End stage renal disease: Secondary | ICD-10-CM | POA: Diagnosis not present

## 2020-08-30 DIAGNOSIS — N2581 Secondary hyperparathyroidism of renal origin: Secondary | ICD-10-CM | POA: Diagnosis not present

## 2020-08-30 DIAGNOSIS — D631 Anemia in chronic kidney disease: Secondary | ICD-10-CM | POA: Diagnosis not present

## 2020-08-30 DIAGNOSIS — D688 Other specified coagulation defects: Secondary | ICD-10-CM | POA: Diagnosis not present

## 2020-08-30 DIAGNOSIS — Z992 Dependence on renal dialysis: Secondary | ICD-10-CM | POA: Diagnosis not present

## 2020-09-02 DIAGNOSIS — N2581 Secondary hyperparathyroidism of renal origin: Secondary | ICD-10-CM | POA: Diagnosis not present

## 2020-09-02 DIAGNOSIS — N186 End stage renal disease: Secondary | ICD-10-CM | POA: Diagnosis not present

## 2020-09-02 DIAGNOSIS — Z992 Dependence on renal dialysis: Secondary | ICD-10-CM | POA: Diagnosis not present

## 2020-09-02 DIAGNOSIS — D688 Other specified coagulation defects: Secondary | ICD-10-CM | POA: Diagnosis not present

## 2020-09-03 DIAGNOSIS — M5412 Radiculopathy, cervical region: Secondary | ICD-10-CM | POA: Diagnosis not present

## 2020-09-03 DIAGNOSIS — N019 Rapidly progressive nephritic syndrome with unspecified morphologic changes: Secondary | ICD-10-CM | POA: Diagnosis not present

## 2020-09-03 DIAGNOSIS — M62830 Muscle spasm of back: Secondary | ICD-10-CM | POA: Diagnosis not present

## 2020-09-03 DIAGNOSIS — M9901 Segmental and somatic dysfunction of cervical region: Secondary | ICD-10-CM | POA: Diagnosis not present

## 2020-09-03 DIAGNOSIS — N186 End stage renal disease: Secondary | ICD-10-CM | POA: Diagnosis not present

## 2020-09-03 DIAGNOSIS — Z992 Dependence on renal dialysis: Secondary | ICD-10-CM | POA: Diagnosis not present

## 2020-09-03 DIAGNOSIS — M9903 Segmental and somatic dysfunction of lumbar region: Secondary | ICD-10-CM | POA: Diagnosis not present

## 2020-09-03 DIAGNOSIS — M9902 Segmental and somatic dysfunction of thoracic region: Secondary | ICD-10-CM | POA: Diagnosis not present

## 2020-09-03 DIAGNOSIS — M5441 Lumbago with sciatica, right side: Secondary | ICD-10-CM | POA: Diagnosis not present

## 2020-09-04 DIAGNOSIS — D688 Other specified coagulation defects: Secondary | ICD-10-CM | POA: Diagnosis not present

## 2020-09-04 DIAGNOSIS — Z992 Dependence on renal dialysis: Secondary | ICD-10-CM | POA: Diagnosis not present

## 2020-09-04 DIAGNOSIS — N186 End stage renal disease: Secondary | ICD-10-CM | POA: Diagnosis not present

## 2020-09-04 DIAGNOSIS — N2581 Secondary hyperparathyroidism of renal origin: Secondary | ICD-10-CM | POA: Diagnosis not present

## 2020-09-06 DIAGNOSIS — Z992 Dependence on renal dialysis: Secondary | ICD-10-CM | POA: Diagnosis not present

## 2020-09-06 DIAGNOSIS — N2581 Secondary hyperparathyroidism of renal origin: Secondary | ICD-10-CM | POA: Diagnosis not present

## 2020-09-06 DIAGNOSIS — N186 End stage renal disease: Secondary | ICD-10-CM | POA: Diagnosis not present

## 2020-09-06 DIAGNOSIS — D688 Other specified coagulation defects: Secondary | ICD-10-CM | POA: Diagnosis not present

## 2020-09-09 DIAGNOSIS — N186 End stage renal disease: Secondary | ICD-10-CM | POA: Diagnosis not present

## 2020-09-09 DIAGNOSIS — Z992 Dependence on renal dialysis: Secondary | ICD-10-CM | POA: Diagnosis not present

## 2020-09-09 DIAGNOSIS — D688 Other specified coagulation defects: Secondary | ICD-10-CM | POA: Diagnosis not present

## 2020-09-09 DIAGNOSIS — N2581 Secondary hyperparathyroidism of renal origin: Secondary | ICD-10-CM | POA: Diagnosis not present

## 2020-09-10 DIAGNOSIS — Z961 Presence of intraocular lens: Secondary | ICD-10-CM | POA: Diagnosis not present

## 2020-09-10 DIAGNOSIS — H43813 Vitreous degeneration, bilateral: Secondary | ICD-10-CM | POA: Diagnosis not present

## 2020-09-10 DIAGNOSIS — D3132 Benign neoplasm of left choroid: Secondary | ICD-10-CM | POA: Diagnosis not present

## 2020-09-10 DIAGNOSIS — G510 Bell's palsy: Secondary | ICD-10-CM | POA: Diagnosis not present

## 2020-09-10 DIAGNOSIS — H35372 Puckering of macula, left eye: Secondary | ICD-10-CM | POA: Diagnosis not present

## 2020-09-10 DIAGNOSIS — H16223 Keratoconjunctivitis sicca, not specified as Sjogren's, bilateral: Secondary | ICD-10-CM | POA: Diagnosis not present

## 2020-09-10 DIAGNOSIS — H401112 Primary open-angle glaucoma, right eye, moderate stage: Secondary | ICD-10-CM | POA: Diagnosis not present

## 2020-09-10 DIAGNOSIS — M5416 Radiculopathy, lumbar region: Secondary | ICD-10-CM | POA: Diagnosis not present

## 2020-09-10 DIAGNOSIS — H401121 Primary open-angle glaucoma, left eye, mild stage: Secondary | ICD-10-CM | POA: Diagnosis not present

## 2020-09-11 DIAGNOSIS — N2581 Secondary hyperparathyroidism of renal origin: Secondary | ICD-10-CM | POA: Diagnosis not present

## 2020-09-11 DIAGNOSIS — N186 End stage renal disease: Secondary | ICD-10-CM | POA: Diagnosis not present

## 2020-09-11 DIAGNOSIS — D688 Other specified coagulation defects: Secondary | ICD-10-CM | POA: Diagnosis not present

## 2020-09-11 DIAGNOSIS — Z992 Dependence on renal dialysis: Secondary | ICD-10-CM | POA: Diagnosis not present

## 2020-09-13 DIAGNOSIS — Z992 Dependence on renal dialysis: Secondary | ICD-10-CM | POA: Diagnosis not present

## 2020-09-13 DIAGNOSIS — N186 End stage renal disease: Secondary | ICD-10-CM | POA: Diagnosis not present

## 2020-09-13 DIAGNOSIS — N2581 Secondary hyperparathyroidism of renal origin: Secondary | ICD-10-CM | POA: Diagnosis not present

## 2020-09-13 DIAGNOSIS — D688 Other specified coagulation defects: Secondary | ICD-10-CM | POA: Diagnosis not present

## 2020-09-15 ENCOUNTER — Other Ambulatory Visit: Payer: Self-pay | Admitting: Neurological Surgery

## 2020-09-15 DIAGNOSIS — M5416 Radiculopathy, lumbar region: Secondary | ICD-10-CM

## 2020-09-16 DIAGNOSIS — N186 End stage renal disease: Secondary | ICD-10-CM | POA: Diagnosis not present

## 2020-09-16 DIAGNOSIS — N2581 Secondary hyperparathyroidism of renal origin: Secondary | ICD-10-CM | POA: Diagnosis not present

## 2020-09-16 DIAGNOSIS — Z992 Dependence on renal dialysis: Secondary | ICD-10-CM | POA: Diagnosis not present

## 2020-09-16 DIAGNOSIS — D688 Other specified coagulation defects: Secondary | ICD-10-CM | POA: Diagnosis not present

## 2020-09-18 DIAGNOSIS — N186 End stage renal disease: Secondary | ICD-10-CM | POA: Diagnosis not present

## 2020-09-18 DIAGNOSIS — N2581 Secondary hyperparathyroidism of renal origin: Secondary | ICD-10-CM | POA: Diagnosis not present

## 2020-09-18 DIAGNOSIS — D688 Other specified coagulation defects: Secondary | ICD-10-CM | POA: Diagnosis not present

## 2020-09-18 DIAGNOSIS — Z992 Dependence on renal dialysis: Secondary | ICD-10-CM | POA: Diagnosis not present

## 2020-09-20 DIAGNOSIS — N2581 Secondary hyperparathyroidism of renal origin: Secondary | ICD-10-CM | POA: Diagnosis not present

## 2020-09-20 DIAGNOSIS — N186 End stage renal disease: Secondary | ICD-10-CM | POA: Diagnosis not present

## 2020-09-20 DIAGNOSIS — Z992 Dependence on renal dialysis: Secondary | ICD-10-CM | POA: Diagnosis not present

## 2020-09-20 DIAGNOSIS — D688 Other specified coagulation defects: Secondary | ICD-10-CM | POA: Diagnosis not present

## 2020-09-23 DIAGNOSIS — Z992 Dependence on renal dialysis: Secondary | ICD-10-CM | POA: Diagnosis not present

## 2020-09-23 DIAGNOSIS — Z7682 Awaiting organ transplant status: Secondary | ICD-10-CM | POA: Diagnosis not present

## 2020-09-23 DIAGNOSIS — N2581 Secondary hyperparathyroidism of renal origin: Secondary | ICD-10-CM | POA: Diagnosis not present

## 2020-09-23 DIAGNOSIS — D631 Anemia in chronic kidney disease: Secondary | ICD-10-CM | POA: Diagnosis not present

## 2020-09-23 DIAGNOSIS — N186 End stage renal disease: Secondary | ICD-10-CM | POA: Diagnosis not present

## 2020-09-23 DIAGNOSIS — D688 Other specified coagulation defects: Secondary | ICD-10-CM | POA: Diagnosis not present

## 2020-09-25 DIAGNOSIS — D688 Other specified coagulation defects: Secondary | ICD-10-CM | POA: Diagnosis not present

## 2020-09-25 DIAGNOSIS — N186 End stage renal disease: Secondary | ICD-10-CM | POA: Diagnosis not present

## 2020-09-25 DIAGNOSIS — D631 Anemia in chronic kidney disease: Secondary | ICD-10-CM | POA: Diagnosis not present

## 2020-09-25 DIAGNOSIS — Z992 Dependence on renal dialysis: Secondary | ICD-10-CM | POA: Diagnosis not present

## 2020-09-25 DIAGNOSIS — N2581 Secondary hyperparathyroidism of renal origin: Secondary | ICD-10-CM | POA: Diagnosis not present

## 2020-09-27 DIAGNOSIS — N186 End stage renal disease: Secondary | ICD-10-CM | POA: Diagnosis not present

## 2020-09-27 DIAGNOSIS — D631 Anemia in chronic kidney disease: Secondary | ICD-10-CM | POA: Diagnosis not present

## 2020-09-27 DIAGNOSIS — N2581 Secondary hyperparathyroidism of renal origin: Secondary | ICD-10-CM | POA: Diagnosis not present

## 2020-09-27 DIAGNOSIS — Z992 Dependence on renal dialysis: Secondary | ICD-10-CM | POA: Diagnosis not present

## 2020-09-27 DIAGNOSIS — D688 Other specified coagulation defects: Secondary | ICD-10-CM | POA: Diagnosis not present

## 2020-09-30 DIAGNOSIS — N186 End stage renal disease: Secondary | ICD-10-CM | POA: Diagnosis not present

## 2020-09-30 DIAGNOSIS — Z992 Dependence on renal dialysis: Secondary | ICD-10-CM | POA: Diagnosis not present

## 2020-09-30 DIAGNOSIS — N2581 Secondary hyperparathyroidism of renal origin: Secondary | ICD-10-CM | POA: Diagnosis not present

## 2020-09-30 DIAGNOSIS — D688 Other specified coagulation defects: Secondary | ICD-10-CM | POA: Diagnosis not present

## 2020-10-01 DIAGNOSIS — S0031XA Abrasion of nose, initial encounter: Secondary | ICD-10-CM | POA: Diagnosis not present

## 2020-10-01 DIAGNOSIS — S61213A Laceration without foreign body of left middle finger without damage to nail, initial encounter: Secondary | ICD-10-CM | POA: Diagnosis not present

## 2020-10-01 DIAGNOSIS — S00511A Abrasion of lip, initial encounter: Secondary | ICD-10-CM | POA: Diagnosis not present

## 2020-10-01 DIAGNOSIS — S62616A Displaced fracture of proximal phalanx of right little finger, initial encounter for closed fracture: Secondary | ICD-10-CM | POA: Diagnosis not present

## 2020-10-01 DIAGNOSIS — W010XXA Fall on same level from slipping, tripping and stumbling without subsequent striking against object, initial encounter: Secondary | ICD-10-CM | POA: Diagnosis not present

## 2020-10-01 DIAGNOSIS — S80211A Abrasion, right knee, initial encounter: Secondary | ICD-10-CM | POA: Diagnosis not present

## 2020-10-01 DIAGNOSIS — M79644 Pain in right finger(s): Secondary | ICD-10-CM | POA: Diagnosis not present

## 2020-10-02 DIAGNOSIS — Z992 Dependence on renal dialysis: Secondary | ICD-10-CM | POA: Diagnosis not present

## 2020-10-02 DIAGNOSIS — N186 End stage renal disease: Secondary | ICD-10-CM | POA: Diagnosis not present

## 2020-10-02 DIAGNOSIS — D688 Other specified coagulation defects: Secondary | ICD-10-CM | POA: Diagnosis not present

## 2020-10-02 DIAGNOSIS — N2581 Secondary hyperparathyroidism of renal origin: Secondary | ICD-10-CM | POA: Diagnosis not present

## 2020-10-03 ENCOUNTER — Other Ambulatory Visit: Payer: Self-pay

## 2020-10-03 ENCOUNTER — Ambulatory Visit
Admission: RE | Admit: 2020-10-03 | Discharge: 2020-10-03 | Disposition: A | Discharge status: Home / Self Care | Payer: Medicare HMO | Source: Ambulatory Visit | Attending: Neurological Surgery | Admitting: Neurological Surgery

## 2020-10-03 DIAGNOSIS — M5416 Radiculopathy, lumbar region: Secondary | ICD-10-CM

## 2020-10-03 DIAGNOSIS — M545 Low back pain, unspecified: Secondary | ICD-10-CM | POA: Diagnosis not present

## 2020-10-03 DIAGNOSIS — N186 End stage renal disease: Secondary | ICD-10-CM | POA: Diagnosis not present

## 2020-10-03 DIAGNOSIS — Z992 Dependence on renal dialysis: Secondary | ICD-10-CM | POA: Diagnosis not present

## 2020-10-03 DIAGNOSIS — M48061 Spinal stenosis, lumbar region without neurogenic claudication: Secondary | ICD-10-CM | POA: Diagnosis not present

## 2020-10-03 DIAGNOSIS — N019 Rapidly progressive nephritic syndrome with unspecified morphologic changes: Secondary | ICD-10-CM | POA: Diagnosis not present

## 2020-10-04 DIAGNOSIS — Z992 Dependence on renal dialysis: Secondary | ICD-10-CM | POA: Diagnosis not present

## 2020-10-04 DIAGNOSIS — D688 Other specified coagulation defects: Secondary | ICD-10-CM | POA: Diagnosis not present

## 2020-10-04 DIAGNOSIS — N2581 Secondary hyperparathyroidism of renal origin: Secondary | ICD-10-CM | POA: Diagnosis not present

## 2020-10-04 DIAGNOSIS — N186 End stage renal disease: Secondary | ICD-10-CM | POA: Diagnosis not present

## 2020-10-07 DIAGNOSIS — D688 Other specified coagulation defects: Secondary | ICD-10-CM | POA: Diagnosis not present

## 2020-10-07 DIAGNOSIS — Z992 Dependence on renal dialysis: Secondary | ICD-10-CM | POA: Diagnosis not present

## 2020-10-07 DIAGNOSIS — N2581 Secondary hyperparathyroidism of renal origin: Secondary | ICD-10-CM | POA: Diagnosis not present

## 2020-10-07 DIAGNOSIS — N186 End stage renal disease: Secondary | ICD-10-CM | POA: Diagnosis not present

## 2020-10-08 DIAGNOSIS — S62646D Nondisplaced fracture of proximal phalanx of right little finger, subsequent encounter for fracture with routine healing: Secondary | ICD-10-CM | POA: Diagnosis not present

## 2020-10-08 DIAGNOSIS — M5416 Radiculopathy, lumbar region: Secondary | ICD-10-CM | POA: Diagnosis not present

## 2020-10-09 DIAGNOSIS — N2581 Secondary hyperparathyroidism of renal origin: Secondary | ICD-10-CM | POA: Diagnosis not present

## 2020-10-09 DIAGNOSIS — N186 End stage renal disease: Secondary | ICD-10-CM | POA: Diagnosis not present

## 2020-10-09 DIAGNOSIS — D688 Other specified coagulation defects: Secondary | ICD-10-CM | POA: Diagnosis not present

## 2020-10-09 DIAGNOSIS — Z992 Dependence on renal dialysis: Secondary | ICD-10-CM | POA: Diagnosis not present

## 2020-10-10 DIAGNOSIS — H35372 Puckering of macula, left eye: Secondary | ICD-10-CM | POA: Diagnosis not present

## 2020-10-10 DIAGNOSIS — H401121 Primary open-angle glaucoma, left eye, mild stage: Secondary | ICD-10-CM | POA: Diagnosis not present

## 2020-10-10 DIAGNOSIS — Z961 Presence of intraocular lens: Secondary | ICD-10-CM | POA: Diagnosis not present

## 2020-10-10 DIAGNOSIS — G510 Bell's palsy: Secondary | ICD-10-CM | POA: Diagnosis not present

## 2020-10-10 DIAGNOSIS — H16223 Keratoconjunctivitis sicca, not specified as Sjogren's, bilateral: Secondary | ICD-10-CM | POA: Diagnosis not present

## 2020-10-10 DIAGNOSIS — H401112 Primary open-angle glaucoma, right eye, moderate stage: Secondary | ICD-10-CM | POA: Diagnosis not present

## 2020-10-10 DIAGNOSIS — H43813 Vitreous degeneration, bilateral: Secondary | ICD-10-CM | POA: Diagnosis not present

## 2020-10-10 DIAGNOSIS — D3132 Benign neoplasm of left choroid: Secondary | ICD-10-CM | POA: Diagnosis not present

## 2020-10-11 DIAGNOSIS — N2581 Secondary hyperparathyroidism of renal origin: Secondary | ICD-10-CM | POA: Diagnosis not present

## 2020-10-11 DIAGNOSIS — N186 End stage renal disease: Secondary | ICD-10-CM | POA: Diagnosis not present

## 2020-10-11 DIAGNOSIS — Z992 Dependence on renal dialysis: Secondary | ICD-10-CM | POA: Diagnosis not present

## 2020-10-11 DIAGNOSIS — D688 Other specified coagulation defects: Secondary | ICD-10-CM | POA: Diagnosis not present

## 2020-10-14 DIAGNOSIS — D688 Other specified coagulation defects: Secondary | ICD-10-CM | POA: Diagnosis not present

## 2020-10-14 DIAGNOSIS — N186 End stage renal disease: Secondary | ICD-10-CM | POA: Diagnosis not present

## 2020-10-14 DIAGNOSIS — N2581 Secondary hyperparathyroidism of renal origin: Secondary | ICD-10-CM | POA: Diagnosis not present

## 2020-10-14 DIAGNOSIS — Z992 Dependence on renal dialysis: Secondary | ICD-10-CM | POA: Diagnosis not present

## 2020-10-16 DIAGNOSIS — N2581 Secondary hyperparathyroidism of renal origin: Secondary | ICD-10-CM | POA: Diagnosis not present

## 2020-10-16 DIAGNOSIS — N186 End stage renal disease: Secondary | ICD-10-CM | POA: Diagnosis not present

## 2020-10-16 DIAGNOSIS — D688 Other specified coagulation defects: Secondary | ICD-10-CM | POA: Diagnosis not present

## 2020-10-16 DIAGNOSIS — Z992 Dependence on renal dialysis: Secondary | ICD-10-CM | POA: Diagnosis not present

## 2020-10-17 DIAGNOSIS — D688 Other specified coagulation defects: Secondary | ICD-10-CM | POA: Diagnosis not present

## 2020-10-17 DIAGNOSIS — Z992 Dependence on renal dialysis: Secondary | ICD-10-CM | POA: Diagnosis not present

## 2020-10-17 DIAGNOSIS — N186 End stage renal disease: Secondary | ICD-10-CM | POA: Diagnosis not present

## 2020-10-17 DIAGNOSIS — N2581 Secondary hyperparathyroidism of renal origin: Secondary | ICD-10-CM | POA: Diagnosis not present

## 2020-10-21 DIAGNOSIS — N2581 Secondary hyperparathyroidism of renal origin: Secondary | ICD-10-CM | POA: Diagnosis not present

## 2020-10-21 DIAGNOSIS — D631 Anemia in chronic kidney disease: Secondary | ICD-10-CM | POA: Diagnosis not present

## 2020-10-21 DIAGNOSIS — D688 Other specified coagulation defects: Secondary | ICD-10-CM | POA: Diagnosis not present

## 2020-10-21 DIAGNOSIS — Z992 Dependence on renal dialysis: Secondary | ICD-10-CM | POA: Diagnosis not present

## 2020-10-21 DIAGNOSIS — N186 End stage renal disease: Secondary | ICD-10-CM | POA: Diagnosis not present

## 2020-10-23 DIAGNOSIS — D631 Anemia in chronic kidney disease: Secondary | ICD-10-CM | POA: Diagnosis not present

## 2020-10-23 DIAGNOSIS — D688 Other specified coagulation defects: Secondary | ICD-10-CM | POA: Diagnosis not present

## 2020-10-23 DIAGNOSIS — N2581 Secondary hyperparathyroidism of renal origin: Secondary | ICD-10-CM | POA: Diagnosis not present

## 2020-10-23 DIAGNOSIS — N186 End stage renal disease: Secondary | ICD-10-CM | POA: Diagnosis not present

## 2020-10-23 DIAGNOSIS — Z992 Dependence on renal dialysis: Secondary | ICD-10-CM | POA: Diagnosis not present

## 2020-10-24 DIAGNOSIS — S62646D Nondisplaced fracture of proximal phalanx of right little finger, subsequent encounter for fracture with routine healing: Secondary | ICD-10-CM | POA: Diagnosis not present

## 2020-10-25 DIAGNOSIS — N186 End stage renal disease: Secondary | ICD-10-CM | POA: Diagnosis not present

## 2020-10-25 DIAGNOSIS — D688 Other specified coagulation defects: Secondary | ICD-10-CM | POA: Diagnosis not present

## 2020-10-25 DIAGNOSIS — N2581 Secondary hyperparathyroidism of renal origin: Secondary | ICD-10-CM | POA: Diagnosis not present

## 2020-10-25 DIAGNOSIS — Z992 Dependence on renal dialysis: Secondary | ICD-10-CM | POA: Diagnosis not present

## 2020-10-25 DIAGNOSIS — D631 Anemia in chronic kidney disease: Secondary | ICD-10-CM | POA: Diagnosis not present

## 2020-10-28 DIAGNOSIS — D688 Other specified coagulation defects: Secondary | ICD-10-CM | POA: Diagnosis not present

## 2020-10-28 DIAGNOSIS — N2581 Secondary hyperparathyroidism of renal origin: Secondary | ICD-10-CM | POA: Diagnosis not present

## 2020-10-28 DIAGNOSIS — Z992 Dependence on renal dialysis: Secondary | ICD-10-CM | POA: Diagnosis not present

## 2020-10-28 DIAGNOSIS — N186 End stage renal disease: Secondary | ICD-10-CM | POA: Diagnosis not present

## 2020-10-30 DIAGNOSIS — Z992 Dependence on renal dialysis: Secondary | ICD-10-CM | POA: Diagnosis not present

## 2020-10-30 DIAGNOSIS — D688 Other specified coagulation defects: Secondary | ICD-10-CM | POA: Diagnosis not present

## 2020-10-30 DIAGNOSIS — N2581 Secondary hyperparathyroidism of renal origin: Secondary | ICD-10-CM | POA: Diagnosis not present

## 2020-10-30 DIAGNOSIS — N186 End stage renal disease: Secondary | ICD-10-CM | POA: Diagnosis not present

## 2020-11-01 DIAGNOSIS — N2581 Secondary hyperparathyroidism of renal origin: Secondary | ICD-10-CM | POA: Diagnosis not present

## 2020-11-01 DIAGNOSIS — D688 Other specified coagulation defects: Secondary | ICD-10-CM | POA: Diagnosis not present

## 2020-11-01 DIAGNOSIS — N186 End stage renal disease: Secondary | ICD-10-CM | POA: Diagnosis not present

## 2020-11-01 DIAGNOSIS — Z992 Dependence on renal dialysis: Secondary | ICD-10-CM | POA: Diagnosis not present

## 2020-11-03 DIAGNOSIS — N186 End stage renal disease: Secondary | ICD-10-CM | POA: Diagnosis not present

## 2020-11-03 DIAGNOSIS — Z992 Dependence on renal dialysis: Secondary | ICD-10-CM | POA: Diagnosis not present

## 2020-11-03 DIAGNOSIS — N019 Rapidly progressive nephritic syndrome with unspecified morphologic changes: Secondary | ICD-10-CM | POA: Diagnosis not present

## 2020-11-04 DIAGNOSIS — N2581 Secondary hyperparathyroidism of renal origin: Secondary | ICD-10-CM | POA: Diagnosis not present

## 2020-11-04 DIAGNOSIS — D688 Other specified coagulation defects: Secondary | ICD-10-CM | POA: Diagnosis not present

## 2020-11-04 DIAGNOSIS — N186 End stage renal disease: Secondary | ICD-10-CM | POA: Diagnosis not present

## 2020-11-04 DIAGNOSIS — Z992 Dependence on renal dialysis: Secondary | ICD-10-CM | POA: Diagnosis not present

## 2020-11-06 DIAGNOSIS — D688 Other specified coagulation defects: Secondary | ICD-10-CM | POA: Diagnosis not present

## 2020-11-06 DIAGNOSIS — N2581 Secondary hyperparathyroidism of renal origin: Secondary | ICD-10-CM | POA: Diagnosis not present

## 2020-11-06 DIAGNOSIS — N186 End stage renal disease: Secondary | ICD-10-CM | POA: Diagnosis not present

## 2020-11-06 DIAGNOSIS — Z992 Dependence on renal dialysis: Secondary | ICD-10-CM | POA: Diagnosis not present

## 2020-11-07 DIAGNOSIS — S62646D Nondisplaced fracture of proximal phalanx of right little finger, subsequent encounter for fracture with routine healing: Secondary | ICD-10-CM | POA: Diagnosis not present

## 2020-11-08 DIAGNOSIS — N2581 Secondary hyperparathyroidism of renal origin: Secondary | ICD-10-CM | POA: Diagnosis not present

## 2020-11-08 DIAGNOSIS — N186 End stage renal disease: Secondary | ICD-10-CM | POA: Diagnosis not present

## 2020-11-08 DIAGNOSIS — D688 Other specified coagulation defects: Secondary | ICD-10-CM | POA: Diagnosis not present

## 2020-11-08 DIAGNOSIS — Z992 Dependence on renal dialysis: Secondary | ICD-10-CM | POA: Diagnosis not present

## 2020-11-11 DIAGNOSIS — N186 End stage renal disease: Secondary | ICD-10-CM | POA: Diagnosis not present

## 2020-11-11 DIAGNOSIS — D688 Other specified coagulation defects: Secondary | ICD-10-CM | POA: Diagnosis not present

## 2020-11-11 DIAGNOSIS — N2581 Secondary hyperparathyroidism of renal origin: Secondary | ICD-10-CM | POA: Diagnosis not present

## 2020-11-11 DIAGNOSIS — Z992 Dependence on renal dialysis: Secondary | ICD-10-CM | POA: Diagnosis not present

## 2020-11-13 DIAGNOSIS — D688 Other specified coagulation defects: Secondary | ICD-10-CM | POA: Diagnosis not present

## 2020-11-13 DIAGNOSIS — Z992 Dependence on renal dialysis: Secondary | ICD-10-CM | POA: Diagnosis not present

## 2020-11-13 DIAGNOSIS — N186 End stage renal disease: Secondary | ICD-10-CM | POA: Diagnosis not present

## 2020-11-13 DIAGNOSIS — N2581 Secondary hyperparathyroidism of renal origin: Secondary | ICD-10-CM | POA: Diagnosis not present

## 2020-11-15 DIAGNOSIS — N186 End stage renal disease: Secondary | ICD-10-CM | POA: Diagnosis not present

## 2020-11-15 DIAGNOSIS — D688 Other specified coagulation defects: Secondary | ICD-10-CM | POA: Diagnosis not present

## 2020-11-15 DIAGNOSIS — N2581 Secondary hyperparathyroidism of renal origin: Secondary | ICD-10-CM | POA: Diagnosis not present

## 2020-11-15 DIAGNOSIS — Z992 Dependence on renal dialysis: Secondary | ICD-10-CM | POA: Diagnosis not present

## 2020-11-18 DIAGNOSIS — Z992 Dependence on renal dialysis: Secondary | ICD-10-CM | POA: Diagnosis not present

## 2020-11-18 DIAGNOSIS — D688 Other specified coagulation defects: Secondary | ICD-10-CM | POA: Diagnosis not present

## 2020-11-18 DIAGNOSIS — N186 End stage renal disease: Secondary | ICD-10-CM | POA: Diagnosis not present

## 2020-11-18 DIAGNOSIS — N2581 Secondary hyperparathyroidism of renal origin: Secondary | ICD-10-CM | POA: Diagnosis not present

## 2020-11-20 DIAGNOSIS — D688 Other specified coagulation defects: Secondary | ICD-10-CM | POA: Diagnosis not present

## 2020-11-20 DIAGNOSIS — N2581 Secondary hyperparathyroidism of renal origin: Secondary | ICD-10-CM | POA: Diagnosis not present

## 2020-11-20 DIAGNOSIS — Z992 Dependence on renal dialysis: Secondary | ICD-10-CM | POA: Diagnosis not present

## 2020-11-20 DIAGNOSIS — N186 End stage renal disease: Secondary | ICD-10-CM | POA: Diagnosis not present

## 2020-11-22 DIAGNOSIS — N186 End stage renal disease: Secondary | ICD-10-CM | POA: Diagnosis not present

## 2020-11-22 DIAGNOSIS — D688 Other specified coagulation defects: Secondary | ICD-10-CM | POA: Diagnosis not present

## 2020-11-22 DIAGNOSIS — Z992 Dependence on renal dialysis: Secondary | ICD-10-CM | POA: Diagnosis not present

## 2020-11-22 DIAGNOSIS — N2581 Secondary hyperparathyroidism of renal origin: Secondary | ICD-10-CM | POA: Diagnosis not present

## 2020-11-25 DIAGNOSIS — N2581 Secondary hyperparathyroidism of renal origin: Secondary | ICD-10-CM | POA: Diagnosis not present

## 2020-11-25 DIAGNOSIS — N186 End stage renal disease: Secondary | ICD-10-CM | POA: Diagnosis not present

## 2020-11-25 DIAGNOSIS — D688 Other specified coagulation defects: Secondary | ICD-10-CM | POA: Diagnosis not present

## 2020-11-25 DIAGNOSIS — Z992 Dependence on renal dialysis: Secondary | ICD-10-CM | POA: Diagnosis not present

## 2020-11-28 DIAGNOSIS — D688 Other specified coagulation defects: Secondary | ICD-10-CM | POA: Diagnosis not present

## 2020-11-28 DIAGNOSIS — Z992 Dependence on renal dialysis: Secondary | ICD-10-CM | POA: Diagnosis not present

## 2020-11-28 DIAGNOSIS — N186 End stage renal disease: Secondary | ICD-10-CM | POA: Diagnosis not present

## 2020-11-28 DIAGNOSIS — N2581 Secondary hyperparathyroidism of renal origin: Secondary | ICD-10-CM | POA: Diagnosis not present

## 2020-11-30 DIAGNOSIS — Z992 Dependence on renal dialysis: Secondary | ICD-10-CM | POA: Diagnosis not present

## 2020-11-30 DIAGNOSIS — N186 End stage renal disease: Secondary | ICD-10-CM | POA: Diagnosis not present

## 2020-11-30 DIAGNOSIS — D688 Other specified coagulation defects: Secondary | ICD-10-CM | POA: Diagnosis not present

## 2020-11-30 DIAGNOSIS — N2581 Secondary hyperparathyroidism of renal origin: Secondary | ICD-10-CM | POA: Diagnosis not present

## 2020-12-01 DIAGNOSIS — D3132 Benign neoplasm of left choroid: Secondary | ICD-10-CM | POA: Diagnosis not present

## 2020-12-01 DIAGNOSIS — Z961 Presence of intraocular lens: Secondary | ICD-10-CM | POA: Diagnosis not present

## 2020-12-01 DIAGNOSIS — H401112 Primary open-angle glaucoma, right eye, moderate stage: Secondary | ICD-10-CM | POA: Diagnosis not present

## 2020-12-01 DIAGNOSIS — H35371 Puckering of macula, right eye: Secondary | ICD-10-CM | POA: Diagnosis not present

## 2020-12-01 DIAGNOSIS — H16223 Keratoconjunctivitis sicca, not specified as Sjogren's, bilateral: Secondary | ICD-10-CM | POA: Diagnosis not present

## 2020-12-01 DIAGNOSIS — H43813 Vitreous degeneration, bilateral: Secondary | ICD-10-CM | POA: Diagnosis not present

## 2020-12-01 DIAGNOSIS — H401121 Primary open-angle glaucoma, left eye, mild stage: Secondary | ICD-10-CM | POA: Diagnosis not present

## 2020-12-01 DIAGNOSIS — G510 Bell's palsy: Secondary | ICD-10-CM | POA: Diagnosis not present

## 2020-12-02 DIAGNOSIS — N186 End stage renal disease: Secondary | ICD-10-CM | POA: Diagnosis not present

## 2020-12-02 DIAGNOSIS — N2581 Secondary hyperparathyroidism of renal origin: Secondary | ICD-10-CM | POA: Diagnosis not present

## 2020-12-02 DIAGNOSIS — Z992 Dependence on renal dialysis: Secondary | ICD-10-CM | POA: Diagnosis not present

## 2020-12-02 DIAGNOSIS — D688 Other specified coagulation defects: Secondary | ICD-10-CM | POA: Diagnosis not present

## 2020-12-03 DIAGNOSIS — Z992 Dependence on renal dialysis: Secondary | ICD-10-CM | POA: Diagnosis not present

## 2020-12-03 DIAGNOSIS — N019 Rapidly progressive nephritic syndrome with unspecified morphologic changes: Secondary | ICD-10-CM | POA: Diagnosis not present

## 2020-12-03 DIAGNOSIS — S62646D Nondisplaced fracture of proximal phalanx of right little finger, subsequent encounter for fracture with routine healing: Secondary | ICD-10-CM | POA: Diagnosis not present

## 2020-12-03 DIAGNOSIS — N186 End stage renal disease: Secondary | ICD-10-CM | POA: Diagnosis not present

## 2020-12-03 DIAGNOSIS — M65331 Trigger finger, right middle finger: Secondary | ICD-10-CM | POA: Diagnosis not present

## 2020-12-04 DIAGNOSIS — Z992 Dependence on renal dialysis: Secondary | ICD-10-CM | POA: Diagnosis not present

## 2020-12-04 DIAGNOSIS — N2581 Secondary hyperparathyroidism of renal origin: Secondary | ICD-10-CM | POA: Diagnosis not present

## 2020-12-04 DIAGNOSIS — N186 End stage renal disease: Secondary | ICD-10-CM | POA: Diagnosis not present

## 2020-12-04 DIAGNOSIS — D688 Other specified coagulation defects: Secondary | ICD-10-CM | POA: Diagnosis not present

## 2020-12-06 DIAGNOSIS — D688 Other specified coagulation defects: Secondary | ICD-10-CM | POA: Diagnosis not present

## 2020-12-06 DIAGNOSIS — N2581 Secondary hyperparathyroidism of renal origin: Secondary | ICD-10-CM | POA: Diagnosis not present

## 2020-12-06 DIAGNOSIS — Z992 Dependence on renal dialysis: Secondary | ICD-10-CM | POA: Diagnosis not present

## 2020-12-06 DIAGNOSIS — N186 End stage renal disease: Secondary | ICD-10-CM | POA: Diagnosis not present

## 2020-12-08 DIAGNOSIS — M9902 Segmental and somatic dysfunction of thoracic region: Secondary | ICD-10-CM | POA: Diagnosis not present

## 2020-12-08 DIAGNOSIS — M5441 Lumbago with sciatica, right side: Secondary | ICD-10-CM | POA: Diagnosis not present

## 2020-12-08 DIAGNOSIS — M9903 Segmental and somatic dysfunction of lumbar region: Secondary | ICD-10-CM | POA: Diagnosis not present

## 2020-12-08 DIAGNOSIS — M9901 Segmental and somatic dysfunction of cervical region: Secondary | ICD-10-CM | POA: Diagnosis not present

## 2020-12-09 DIAGNOSIS — N186 End stage renal disease: Secondary | ICD-10-CM | POA: Diagnosis not present

## 2020-12-09 DIAGNOSIS — N2581 Secondary hyperparathyroidism of renal origin: Secondary | ICD-10-CM | POA: Diagnosis not present

## 2020-12-09 DIAGNOSIS — D688 Other specified coagulation defects: Secondary | ICD-10-CM | POA: Diagnosis not present

## 2020-12-09 DIAGNOSIS — Z992 Dependence on renal dialysis: Secondary | ICD-10-CM | POA: Diagnosis not present

## 2020-12-09 DIAGNOSIS — D631 Anemia in chronic kidney disease: Secondary | ICD-10-CM | POA: Diagnosis not present

## 2020-12-10 DIAGNOSIS — M9902 Segmental and somatic dysfunction of thoracic region: Secondary | ICD-10-CM | POA: Diagnosis not present

## 2020-12-10 DIAGNOSIS — Z7682 Awaiting organ transplant status: Secondary | ICD-10-CM | POA: Diagnosis not present

## 2020-12-10 DIAGNOSIS — M9903 Segmental and somatic dysfunction of lumbar region: Secondary | ICD-10-CM | POA: Diagnosis not present

## 2020-12-10 DIAGNOSIS — M9901 Segmental and somatic dysfunction of cervical region: Secondary | ICD-10-CM | POA: Diagnosis not present

## 2020-12-10 DIAGNOSIS — M9905 Segmental and somatic dysfunction of pelvic region: Secondary | ICD-10-CM | POA: Diagnosis not present

## 2020-12-11 DIAGNOSIS — N2581 Secondary hyperparathyroidism of renal origin: Secondary | ICD-10-CM | POA: Diagnosis not present

## 2020-12-11 DIAGNOSIS — N186 End stage renal disease: Secondary | ICD-10-CM | POA: Diagnosis not present

## 2020-12-11 DIAGNOSIS — Z992 Dependence on renal dialysis: Secondary | ICD-10-CM | POA: Diagnosis not present

## 2020-12-11 DIAGNOSIS — D688 Other specified coagulation defects: Secondary | ICD-10-CM | POA: Diagnosis not present

## 2020-12-11 DIAGNOSIS — D631 Anemia in chronic kidney disease: Secondary | ICD-10-CM | POA: Diagnosis not present

## 2020-12-13 DIAGNOSIS — Z992 Dependence on renal dialysis: Secondary | ICD-10-CM | POA: Diagnosis not present

## 2020-12-13 DIAGNOSIS — Z7682 Awaiting organ transplant status: Secondary | ICD-10-CM | POA: Diagnosis not present

## 2020-12-13 DIAGNOSIS — D631 Anemia in chronic kidney disease: Secondary | ICD-10-CM | POA: Diagnosis not present

## 2020-12-13 DIAGNOSIS — D688 Other specified coagulation defects: Secondary | ICD-10-CM | POA: Diagnosis not present

## 2020-12-13 DIAGNOSIS — N186 End stage renal disease: Secondary | ICD-10-CM | POA: Diagnosis not present

## 2020-12-13 DIAGNOSIS — N2581 Secondary hyperparathyroidism of renal origin: Secondary | ICD-10-CM | POA: Diagnosis not present

## 2020-12-14 DIAGNOSIS — E039 Hypothyroidism, unspecified: Secondary | ICD-10-CM | POA: Diagnosis not present

## 2020-12-14 DIAGNOSIS — K219 Gastro-esophageal reflux disease without esophagitis: Secondary | ICD-10-CM | POA: Diagnosis not present

## 2020-12-14 DIAGNOSIS — Z9189 Other specified personal risk factors, not elsewhere classified: Secondary | ICD-10-CM | POA: Diagnosis not present

## 2020-12-14 DIAGNOSIS — M310 Hypersensitivity angiitis: Secondary | ICD-10-CM | POA: Diagnosis not present

## 2020-12-14 DIAGNOSIS — G8918 Other acute postprocedural pain: Secondary | ICD-10-CM | POA: Diagnosis not present

## 2020-12-14 DIAGNOSIS — R739 Hyperglycemia, unspecified: Secondary | ICD-10-CM | POA: Diagnosis not present

## 2020-12-14 DIAGNOSIS — D849 Immunodeficiency, unspecified: Secondary | ICD-10-CM | POA: Diagnosis not present

## 2020-12-14 DIAGNOSIS — Y830 Surgical operation with transplant of whole organ as the cause of abnormal reaction of the patient, or of later complication, without mention of misadventure at the time of the procedure: Secondary | ICD-10-CM | POA: Diagnosis not present

## 2020-12-14 DIAGNOSIS — D84821 Immunodeficiency due to drugs: Secondary | ICD-10-CM | POA: Diagnosis not present

## 2020-12-14 DIAGNOSIS — Z940 Kidney transplant status: Secondary | ICD-10-CM | POA: Diagnosis not present

## 2020-12-14 DIAGNOSIS — N186 End stage renal disease: Secondary | ICD-10-CM | POA: Diagnosis not present

## 2020-12-14 DIAGNOSIS — E872 Acidosis, unspecified: Secondary | ICD-10-CM | POA: Diagnosis not present

## 2020-12-14 DIAGNOSIS — Z992 Dependence on renal dialysis: Secondary | ICD-10-CM | POA: Diagnosis not present

## 2020-12-14 DIAGNOSIS — J9811 Atelectasis: Secondary | ICD-10-CM | POA: Diagnosis not present

## 2020-12-14 DIAGNOSIS — E1122 Type 2 diabetes mellitus with diabetic chronic kidney disease: Secondary | ICD-10-CM | POA: Diagnosis not present

## 2020-12-14 DIAGNOSIS — Z7682 Awaiting organ transplant status: Secondary | ICD-10-CM | POA: Diagnosis not present

## 2020-12-14 DIAGNOSIS — Z792 Long term (current) use of antibiotics: Secondary | ICD-10-CM | POA: Diagnosis not present

## 2020-12-14 DIAGNOSIS — Z72821 Inadequate sleep hygiene: Secondary | ICD-10-CM | POA: Diagnosis not present

## 2020-12-14 DIAGNOSIS — T380X5A Adverse effect of glucocorticoids and synthetic analogues, initial encounter: Secondary | ICD-10-CM | POA: Diagnosis not present

## 2020-12-14 DIAGNOSIS — T8619 Other complication of kidney transplant: Secondary | ICD-10-CM | POA: Diagnosis not present

## 2020-12-14 DIAGNOSIS — Z8711 Personal history of peptic ulcer disease: Secondary | ICD-10-CM | POA: Diagnosis not present

## 2020-12-14 DIAGNOSIS — I120 Hypertensive chronic kidney disease with stage 5 chronic kidney disease or end stage renal disease: Secondary | ICD-10-CM | POA: Diagnosis not present

## 2020-12-14 DIAGNOSIS — Z87442 Personal history of urinary calculi: Secondary | ICD-10-CM | POA: Diagnosis not present

## 2020-12-18 DIAGNOSIS — Z9189 Other specified personal risk factors, not elsewhere classified: Secondary | ICD-10-CM | POA: Diagnosis not present

## 2020-12-18 DIAGNOSIS — Z792 Long term (current) use of antibiotics: Secondary | ICD-10-CM | POA: Diagnosis not present

## 2020-12-18 DIAGNOSIS — G8918 Other acute postprocedural pain: Secondary | ICD-10-CM | POA: Diagnosis not present

## 2020-12-18 DIAGNOSIS — Z72821 Inadequate sleep hygiene: Secondary | ICD-10-CM | POA: Diagnosis not present

## 2020-12-18 DIAGNOSIS — E02 Subclinical iodine-deficiency hypothyroidism: Secondary | ICD-10-CM | POA: Diagnosis not present

## 2020-12-18 DIAGNOSIS — T8619 Other complication of kidney transplant: Secondary | ICD-10-CM | POA: Diagnosis not present

## 2020-12-18 DIAGNOSIS — Z940 Kidney transplant status: Secondary | ICD-10-CM | POA: Diagnosis not present

## 2020-12-18 DIAGNOSIS — D849 Immunodeficiency, unspecified: Secondary | ICD-10-CM | POA: Diagnosis not present

## 2020-12-18 DIAGNOSIS — K219 Gastro-esophageal reflux disease without esophagitis: Secondary | ICD-10-CM | POA: Diagnosis not present

## 2020-12-20 DIAGNOSIS — Z992 Dependence on renal dialysis: Secondary | ICD-10-CM | POA: Diagnosis not present

## 2020-12-20 DIAGNOSIS — N2581 Secondary hyperparathyroidism of renal origin: Secondary | ICD-10-CM | POA: Diagnosis not present

## 2020-12-20 DIAGNOSIS — D689 Coagulation defect, unspecified: Secondary | ICD-10-CM | POA: Diagnosis not present

## 2020-12-20 DIAGNOSIS — N186 End stage renal disease: Secondary | ICD-10-CM | POA: Diagnosis not present

## 2020-12-22 DIAGNOSIS — D849 Immunodeficiency, unspecified: Secondary | ICD-10-CM | POA: Diagnosis not present

## 2020-12-22 DIAGNOSIS — Z940 Kidney transplant status: Secondary | ICD-10-CM | POA: Diagnosis not present

## 2020-12-24 DIAGNOSIS — Z87442 Personal history of urinary calculi: Secondary | ICD-10-CM | POA: Diagnosis not present

## 2020-12-24 DIAGNOSIS — T8619 Other complication of kidney transplant: Secondary | ICD-10-CM | POA: Diagnosis not present

## 2020-12-24 DIAGNOSIS — I10 Essential (primary) hypertension: Secondary | ICD-10-CM | POA: Diagnosis not present

## 2020-12-24 DIAGNOSIS — M5440 Lumbago with sciatica, unspecified side: Secondary | ICD-10-CM | POA: Diagnosis not present

## 2020-12-24 DIAGNOSIS — E039 Hypothyroidism, unspecified: Secondary | ICD-10-CM | POA: Diagnosis not present

## 2020-12-24 DIAGNOSIS — Z8711 Personal history of peptic ulcer disease: Secondary | ICD-10-CM | POA: Diagnosis not present

## 2020-12-24 DIAGNOSIS — Z940 Kidney transplant status: Secondary | ICD-10-CM | POA: Diagnosis not present

## 2020-12-24 DIAGNOSIS — M310 Hypersensitivity angiitis: Secondary | ICD-10-CM | POA: Diagnosis not present

## 2020-12-24 DIAGNOSIS — D899 Disorder involving the immune mechanism, unspecified: Secondary | ICD-10-CM | POA: Diagnosis not present

## 2020-12-24 DIAGNOSIS — Z79899 Other long term (current) drug therapy: Secondary | ICD-10-CM | POA: Diagnosis not present

## 2020-12-24 DIAGNOSIS — Z4822 Encounter for aftercare following kidney transplant: Secondary | ICD-10-CM | POA: Diagnosis not present

## 2020-12-24 DIAGNOSIS — D84821 Immunodeficiency due to drugs: Secondary | ICD-10-CM | POA: Diagnosis not present

## 2020-12-24 DIAGNOSIS — I150 Renovascular hypertension: Secondary | ICD-10-CM | POA: Diagnosis not present

## 2020-12-24 DIAGNOSIS — K219 Gastro-esophageal reflux disease without esophagitis: Secondary | ICD-10-CM | POA: Diagnosis not present

## 2020-12-24 DIAGNOSIS — D849 Immunodeficiency, unspecified: Secondary | ICD-10-CM | POA: Diagnosis not present

## 2020-12-24 DIAGNOSIS — Z792 Long term (current) use of antibiotics: Secondary | ICD-10-CM | POA: Diagnosis not present

## 2020-12-26 DIAGNOSIS — D849 Immunodeficiency, unspecified: Secondary | ICD-10-CM | POA: Diagnosis not present

## 2020-12-26 DIAGNOSIS — Z940 Kidney transplant status: Secondary | ICD-10-CM | POA: Diagnosis not present

## 2020-12-30 DIAGNOSIS — Z03818 Encounter for observation for suspected exposure to other biological agents ruled out: Secondary | ICD-10-CM | POA: Diagnosis not present

## 2020-12-30 DIAGNOSIS — Z20822 Contact with and (suspected) exposure to covid-19: Secondary | ICD-10-CM | POA: Diagnosis not present

## 2020-12-30 DIAGNOSIS — Z01818 Encounter for other preprocedural examination: Secondary | ICD-10-CM | POA: Diagnosis not present

## 2020-12-31 DIAGNOSIS — G8918 Other acute postprocedural pain: Secondary | ICD-10-CM | POA: Diagnosis not present

## 2020-12-31 DIAGNOSIS — K219 Gastro-esophageal reflux disease without esophagitis: Secondary | ICD-10-CM | POA: Diagnosis not present

## 2020-12-31 DIAGNOSIS — Z940 Kidney transplant status: Secondary | ICD-10-CM | POA: Diagnosis not present

## 2020-12-31 DIAGNOSIS — Z72821 Inadequate sleep hygiene: Secondary | ICD-10-CM | POA: Diagnosis not present

## 2020-12-31 DIAGNOSIS — D849 Immunodeficiency, unspecified: Secondary | ICD-10-CM | POA: Diagnosis not present

## 2020-12-31 DIAGNOSIS — Z792 Long term (current) use of antibiotics: Secondary | ICD-10-CM | POA: Diagnosis not present

## 2020-12-31 DIAGNOSIS — I150 Renovascular hypertension: Secondary | ICD-10-CM | POA: Diagnosis not present

## 2021-01-06 DIAGNOSIS — Z5181 Encounter for therapeutic drug level monitoring: Secondary | ICD-10-CM | POA: Diagnosis not present

## 2021-01-06 DIAGNOSIS — Z940 Kidney transplant status: Secondary | ICD-10-CM | POA: Diagnosis not present

## 2021-01-14 DIAGNOSIS — Z4822 Encounter for aftercare following kidney transplant: Secondary | ICD-10-CM | POA: Diagnosis not present

## 2021-01-14 DIAGNOSIS — M549 Dorsalgia, unspecified: Secondary | ICD-10-CM | POA: Diagnosis not present

## 2021-01-14 DIAGNOSIS — R0602 Shortness of breath: Secondary | ICD-10-CM | POA: Diagnosis not present

## 2021-01-14 DIAGNOSIS — R69 Illness, unspecified: Secondary | ICD-10-CM | POA: Diagnosis not present

## 2021-01-14 DIAGNOSIS — I150 Renovascular hypertension: Secondary | ICD-10-CM | POA: Diagnosis not present

## 2021-01-14 DIAGNOSIS — Z940 Kidney transplant status: Secondary | ICD-10-CM | POA: Diagnosis not present

## 2021-01-14 DIAGNOSIS — G8918 Other acute postprocedural pain: Secondary | ICD-10-CM | POA: Diagnosis not present

## 2021-01-14 DIAGNOSIS — D84821 Immunodeficiency due to drugs: Secondary | ICD-10-CM | POA: Diagnosis not present

## 2021-01-14 DIAGNOSIS — Z792 Long term (current) use of antibiotics: Secondary | ICD-10-CM | POA: Diagnosis not present

## 2021-01-14 DIAGNOSIS — R918 Other nonspecific abnormal finding of lung field: Secondary | ICD-10-CM | POA: Diagnosis not present

## 2021-01-14 DIAGNOSIS — G8929 Other chronic pain: Secondary | ICD-10-CM | POA: Diagnosis not present

## 2021-01-14 DIAGNOSIS — E039 Hypothyroidism, unspecified: Secondary | ICD-10-CM | POA: Diagnosis not present

## 2021-01-14 DIAGNOSIS — Z79621 Long term (current) use of calcineurin inhibitor: Secondary | ICD-10-CM | POA: Diagnosis not present

## 2021-01-14 DIAGNOSIS — K219 Gastro-esophageal reflux disease without esophagitis: Secondary | ICD-10-CM | POA: Diagnosis not present

## 2021-01-14 DIAGNOSIS — D849 Immunodeficiency, unspecified: Secondary | ICD-10-CM | POA: Diagnosis not present

## 2021-01-22 DIAGNOSIS — Z5181 Encounter for therapeutic drug level monitoring: Secondary | ICD-10-CM | POA: Diagnosis not present

## 2021-01-22 DIAGNOSIS — Z940 Kidney transplant status: Secondary | ICD-10-CM | POA: Diagnosis not present

## 2021-01-30 DIAGNOSIS — M310 Hypersensitivity angiitis: Secondary | ICD-10-CM | POA: Diagnosis not present

## 2021-01-30 DIAGNOSIS — Z940 Kidney transplant status: Secondary | ICD-10-CM | POA: Diagnosis not present

## 2021-01-30 DIAGNOSIS — D849 Immunodeficiency, unspecified: Secondary | ICD-10-CM | POA: Diagnosis not present

## 2021-01-30 DIAGNOSIS — K219 Gastro-esophageal reflux disease without esophagitis: Secondary | ICD-10-CM | POA: Diagnosis not present

## 2021-01-30 DIAGNOSIS — R8281 Pyuria: Secondary | ICD-10-CM | POA: Diagnosis not present

## 2021-02-04 DIAGNOSIS — Z1231 Encounter for screening mammogram for malignant neoplasm of breast: Secondary | ICD-10-CM | POA: Diagnosis not present

## 2021-02-11 DIAGNOSIS — Z298 Encounter for other specified prophylactic measures: Secondary | ICD-10-CM | POA: Diagnosis not present

## 2021-02-11 DIAGNOSIS — R300 Dysuria: Secondary | ICD-10-CM | POA: Diagnosis not present

## 2021-02-11 DIAGNOSIS — Z79899 Other long term (current) drug therapy: Secondary | ICD-10-CM | POA: Diagnosis not present

## 2021-02-11 DIAGNOSIS — D638 Anemia in other chronic diseases classified elsewhere: Secondary | ICD-10-CM | POA: Diagnosis not present

## 2021-02-11 DIAGNOSIS — Z940 Kidney transplant status: Secondary | ICD-10-CM | POA: Diagnosis not present

## 2021-02-16 DIAGNOSIS — Z940 Kidney transplant status: Secondary | ICD-10-CM | POA: Diagnosis not present

## 2021-02-16 DIAGNOSIS — Z5181 Encounter for therapeutic drug level monitoring: Secondary | ICD-10-CM | POA: Diagnosis not present

## 2021-02-23 DIAGNOSIS — E7800 Pure hypercholesterolemia, unspecified: Secondary | ICD-10-CM | POA: Diagnosis not present

## 2021-02-23 DIAGNOSIS — E039 Hypothyroidism, unspecified: Secondary | ICD-10-CM | POA: Diagnosis not present

## 2021-02-23 DIAGNOSIS — Z940 Kidney transplant status: Secondary | ICD-10-CM | POA: Diagnosis not present

## 2021-02-23 DIAGNOSIS — Z0000 Encounter for general adult medical examination without abnormal findings: Secondary | ICD-10-CM | POA: Diagnosis not present

## 2021-02-23 DIAGNOSIS — I700 Atherosclerosis of aorta: Secondary | ICD-10-CM | POA: Diagnosis not present

## 2021-02-23 DIAGNOSIS — I129 Hypertensive chronic kidney disease with stage 1 through stage 4 chronic kidney disease, or unspecified chronic kidney disease: Secondary | ICD-10-CM | POA: Diagnosis not present

## 2021-02-27 DIAGNOSIS — Z5181 Encounter for therapeutic drug level monitoring: Secondary | ICD-10-CM | POA: Diagnosis not present

## 2021-02-27 DIAGNOSIS — Z940 Kidney transplant status: Secondary | ICD-10-CM | POA: Diagnosis not present

## 2021-03-03 DIAGNOSIS — Z87448 Personal history of other diseases of urinary system: Secondary | ICD-10-CM | POA: Diagnosis not present

## 2021-03-03 DIAGNOSIS — Z87891 Personal history of nicotine dependence: Secondary | ICD-10-CM | POA: Diagnosis not present

## 2021-03-03 DIAGNOSIS — J3489 Other specified disorders of nose and nasal sinuses: Secondary | ICD-10-CM | POA: Diagnosis not present

## 2021-03-05 DIAGNOSIS — D849 Immunodeficiency, unspecified: Secondary | ICD-10-CM | POA: Diagnosis not present

## 2021-03-05 DIAGNOSIS — Z940 Kidney transplant status: Secondary | ICD-10-CM | POA: Diagnosis not present

## 2021-03-11 DIAGNOSIS — I10 Essential (primary) hypertension: Secondary | ICD-10-CM | POA: Diagnosis not present

## 2021-03-11 DIAGNOSIS — D84821 Immunodeficiency due to drugs: Secondary | ICD-10-CM | POA: Diagnosis not present

## 2021-03-11 DIAGNOSIS — Z87442 Personal history of urinary calculi: Secondary | ICD-10-CM | POA: Diagnosis not present

## 2021-03-11 DIAGNOSIS — Z940 Kidney transplant status: Secondary | ICD-10-CM | POA: Diagnosis not present

## 2021-03-11 DIAGNOSIS — D649 Anemia, unspecified: Secondary | ICD-10-CM | POA: Diagnosis not present

## 2021-03-11 DIAGNOSIS — Z79899 Other long term (current) drug therapy: Secondary | ICD-10-CM | POA: Diagnosis not present

## 2021-03-11 DIAGNOSIS — K219 Gastro-esophageal reflux disease without esophagitis: Secondary | ICD-10-CM | POA: Diagnosis not present

## 2021-03-11 DIAGNOSIS — Z8711 Personal history of peptic ulcer disease: Secondary | ICD-10-CM | POA: Diagnosis not present

## 2021-03-11 DIAGNOSIS — G4700 Insomnia, unspecified: Secondary | ICD-10-CM | POA: Diagnosis not present

## 2021-03-11 DIAGNOSIS — D849 Immunodeficiency, unspecified: Secondary | ICD-10-CM | POA: Diagnosis not present

## 2021-03-11 DIAGNOSIS — R251 Tremor, unspecified: Secondary | ICD-10-CM | POA: Diagnosis not present

## 2021-03-11 DIAGNOSIS — Z4822 Encounter for aftercare following kidney transplant: Secondary | ICD-10-CM | POA: Diagnosis not present

## 2021-03-16 DIAGNOSIS — Z940 Kidney transplant status: Secondary | ICD-10-CM | POA: Diagnosis not present

## 2021-03-16 DIAGNOSIS — Z5181 Encounter for therapeutic drug level monitoring: Secondary | ICD-10-CM | POA: Diagnosis not present

## 2021-03-30 DIAGNOSIS — G510 Bell's palsy: Secondary | ICD-10-CM | POA: Diagnosis not present

## 2021-03-30 DIAGNOSIS — H16223 Keratoconjunctivitis sicca, not specified as Sjogren's, bilateral: Secondary | ICD-10-CM | POA: Diagnosis not present

## 2021-03-30 DIAGNOSIS — H401121 Primary open-angle glaucoma, left eye, mild stage: Secondary | ICD-10-CM | POA: Diagnosis not present

## 2021-03-30 DIAGNOSIS — H401112 Primary open-angle glaucoma, right eye, moderate stage: Secondary | ICD-10-CM | POA: Diagnosis not present

## 2021-03-30 DIAGNOSIS — H43813 Vitreous degeneration, bilateral: Secondary | ICD-10-CM | POA: Diagnosis not present

## 2021-03-30 DIAGNOSIS — H35371 Puckering of macula, right eye: Secondary | ICD-10-CM | POA: Diagnosis not present

## 2021-03-30 DIAGNOSIS — Z961 Presence of intraocular lens: Secondary | ICD-10-CM | POA: Diagnosis not present

## 2021-03-30 DIAGNOSIS — D3132 Benign neoplasm of left choroid: Secondary | ICD-10-CM | POA: Diagnosis not present

## 2021-04-06 DIAGNOSIS — Z940 Kidney transplant status: Secondary | ICD-10-CM | POA: Diagnosis not present

## 2021-04-06 DIAGNOSIS — Z5181 Encounter for therapeutic drug level monitoring: Secondary | ICD-10-CM | POA: Diagnosis not present

## 2021-04-15 DIAGNOSIS — M9903 Segmental and somatic dysfunction of lumbar region: Secondary | ICD-10-CM | POA: Diagnosis not present

## 2021-04-15 DIAGNOSIS — M9902 Segmental and somatic dysfunction of thoracic region: Secondary | ICD-10-CM | POA: Diagnosis not present

## 2021-04-15 DIAGNOSIS — M9901 Segmental and somatic dysfunction of cervical region: Secondary | ICD-10-CM | POA: Diagnosis not present

## 2021-04-15 DIAGNOSIS — M9905 Segmental and somatic dysfunction of pelvic region: Secondary | ICD-10-CM | POA: Diagnosis not present

## 2021-04-17 DIAGNOSIS — M9905 Segmental and somatic dysfunction of pelvic region: Secondary | ICD-10-CM | POA: Diagnosis not present

## 2021-04-17 DIAGNOSIS — M9901 Segmental and somatic dysfunction of cervical region: Secondary | ICD-10-CM | POA: Diagnosis not present

## 2021-04-17 DIAGNOSIS — M9902 Segmental and somatic dysfunction of thoracic region: Secondary | ICD-10-CM | POA: Diagnosis not present

## 2021-04-17 DIAGNOSIS — M9903 Segmental and somatic dysfunction of lumbar region: Secondary | ICD-10-CM | POA: Diagnosis not present

## 2021-04-20 DIAGNOSIS — Z5181 Encounter for therapeutic drug level monitoring: Secondary | ICD-10-CM | POA: Diagnosis not present

## 2021-04-20 DIAGNOSIS — M9901 Segmental and somatic dysfunction of cervical region: Secondary | ICD-10-CM | POA: Diagnosis not present

## 2021-04-20 DIAGNOSIS — M9903 Segmental and somatic dysfunction of lumbar region: Secondary | ICD-10-CM | POA: Diagnosis not present

## 2021-04-20 DIAGNOSIS — Z940 Kidney transplant status: Secondary | ICD-10-CM | POA: Diagnosis not present

## 2021-04-20 DIAGNOSIS — M9905 Segmental and somatic dysfunction of pelvic region: Secondary | ICD-10-CM | POA: Diagnosis not present

## 2021-04-20 DIAGNOSIS — M9902 Segmental and somatic dysfunction of thoracic region: Secondary | ICD-10-CM | POA: Diagnosis not present

## 2021-04-21 DIAGNOSIS — M9902 Segmental and somatic dysfunction of thoracic region: Secondary | ICD-10-CM | POA: Diagnosis not present

## 2021-04-21 DIAGNOSIS — M9905 Segmental and somatic dysfunction of pelvic region: Secondary | ICD-10-CM | POA: Diagnosis not present

## 2021-04-21 DIAGNOSIS — M9901 Segmental and somatic dysfunction of cervical region: Secondary | ICD-10-CM | POA: Diagnosis not present

## 2021-04-21 DIAGNOSIS — M9903 Segmental and somatic dysfunction of lumbar region: Secondary | ICD-10-CM | POA: Diagnosis not present

## 2021-04-22 DIAGNOSIS — M9901 Segmental and somatic dysfunction of cervical region: Secondary | ICD-10-CM | POA: Diagnosis not present

## 2021-04-22 DIAGNOSIS — M9902 Segmental and somatic dysfunction of thoracic region: Secondary | ICD-10-CM | POA: Diagnosis not present

## 2021-04-22 DIAGNOSIS — M9905 Segmental and somatic dysfunction of pelvic region: Secondary | ICD-10-CM | POA: Diagnosis not present

## 2021-04-22 DIAGNOSIS — M9903 Segmental and somatic dysfunction of lumbar region: Secondary | ICD-10-CM | POA: Diagnosis not present

## 2021-04-23 DIAGNOSIS — M9901 Segmental and somatic dysfunction of cervical region: Secondary | ICD-10-CM | POA: Diagnosis not present

## 2021-04-23 DIAGNOSIS — M9902 Segmental and somatic dysfunction of thoracic region: Secondary | ICD-10-CM | POA: Diagnosis not present

## 2021-04-23 DIAGNOSIS — M9903 Segmental and somatic dysfunction of lumbar region: Secondary | ICD-10-CM | POA: Diagnosis not present

## 2021-04-23 DIAGNOSIS — M9905 Segmental and somatic dysfunction of pelvic region: Secondary | ICD-10-CM | POA: Diagnosis not present

## 2021-04-24 DIAGNOSIS — M9903 Segmental and somatic dysfunction of lumbar region: Secondary | ICD-10-CM | POA: Diagnosis not present

## 2021-04-24 DIAGNOSIS — M9901 Segmental and somatic dysfunction of cervical region: Secondary | ICD-10-CM | POA: Diagnosis not present

## 2021-04-24 DIAGNOSIS — M9902 Segmental and somatic dysfunction of thoracic region: Secondary | ICD-10-CM | POA: Diagnosis not present

## 2021-04-24 DIAGNOSIS — M9905 Segmental and somatic dysfunction of pelvic region: Secondary | ICD-10-CM | POA: Diagnosis not present

## 2021-04-27 DIAGNOSIS — M9903 Segmental and somatic dysfunction of lumbar region: Secondary | ICD-10-CM | POA: Diagnosis not present

## 2021-04-27 DIAGNOSIS — M9901 Segmental and somatic dysfunction of cervical region: Secondary | ICD-10-CM | POA: Diagnosis not present

## 2021-04-27 DIAGNOSIS — M9905 Segmental and somatic dysfunction of pelvic region: Secondary | ICD-10-CM | POA: Diagnosis not present

## 2021-04-27 DIAGNOSIS — M9902 Segmental and somatic dysfunction of thoracic region: Secondary | ICD-10-CM | POA: Diagnosis not present

## 2021-04-27 NOTE — Therapy (Incomplete)
?OUTPATIENT PHYSICAL THERAPY LOWER EXTREMITY EVALUATION ? ? ?Patient Name: Cristina Wilson ?MRN: [id number] ?DOB:[birth year], 71 y.o., female ?Today's Date: 04/27/2021 ? ? ? ?Past Medical History:  ?Diagnosis Date  ? Abdominal pain 06/06/2018  ? Acute renal failure (ARF) ([place]) 06/22/2018  ? Anemia in chronic kidney disease 08/25/2018  ? Anemia, unspecified 07/05/2018  ? 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 Anxiety 07/05/2018  ? Arthritis   ? Bell's palsy 2017  ? "mild case"  ? Cerebral aneurysm 08/2014  ? pt. states she has 2 aneurysms  ? Chronic low back pain 04/12/2017  ? Chronic pain syndrome 04/12/2017  ? Closed fracture of left distal radius   ? De Quervain's tenosynovitis 02/10/2018  ? Degeneration of lumbar intervertebral disc 03/01/2017  ? End stage renal failure on dialysis [facility])   ? M/W/F on [employer]  ? GERD (gastroesophageal reflux disease)   ? 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 Headache 10/30/2014  ? Hyperlipidemia 04/17/2018  ? Hypertension   ? Hypothyroidism   ? Iron deficiency anemia, unspecified 07/12/2018  ? PONV (postoperative nausea and vomiting)   ? violent vomiting  ? Sacral back pain 05/25/2016  ? Scoliosis of lumbar spine 03/01/2017  ? Secondary hyperparathyroidism of renal origin ([place]) 08/22/2018  ? 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 Thyroid disease   ? Trigger finger of left thumb 02/10/2018  ? ?Past Surgical History:  ?Procedure Laterality Date  ? ARTERY BIOPSY Right 10/31/2014  ? Procedure: BIOPSY TEMPORAL ARTERY-RIGHT;  Surgeon: Mal Misty, MD;  Location: [place];  Service: Vascular;  Laterality: Right;  ? AV FISTULA PLACEMENT Left 10/11/2018  ? Procedure: left arm ARTERIOVENOUS (AV) FISTULA  creation;  Surgeon: Waynetta Sandy, MD;  Location: [place];  Service: Vascular;  Laterality: Left;  ? BILATERAL CARPAL TUNNEL RELEASE    ? BIOPSY  04/29/2019  ? Procedure: BIOPSY;  Surgeon: Otis Brace, MD;  Location: [facility] ENDOSCOPY;  Service: Gastroenterology;;   ? BIOPSY  11/05/2019  ? Procedure: BIOPSY;  Surgeon: Otis Brace, MD;  Location: [facility] ENDOSCOPY;  Service: Gastroenterology;;  ? BREAST SURGERY Left   ? lumpectomy  ? BUNIONECTOMY Right   ? CHOLECYSTECTOMY N/A 10/13/2016  ? Procedure: LAPAROSCOPIC CHOLECYSTECTOMY WITH INTRAOPERATIVE CHOLANGIOGRAM;  Surgeon: Donnie Mesa, MD;  Location: [place];  Service: General;  Laterality: N/A;  ? COLONOSCOPY WITH PROPOFOL N/A 11/05/2019  ? Procedure: COLONOSCOPY WITH PROPOFOL;  Surgeon: Otis Brace, MD;  Location: [facility] ENDOSCOPY;  Service: Gastroenterology;  Laterality: N/A;  ? ESOPHAGOGASTRODUODENOSCOPY (EGD) WITH PROPOFOL N/A 04/29/2019  ? Procedure: ESOPHAGOGASTRODUODENOSCOPY (EGD) WITH PROPOFOL;  Surgeon: Otis Brace, MD;  Location: [facility] ENDOSCOPY;  Service: Gastroenterology;  Laterality: N/A;  ? EYE SURGERY    ? surgery for cross eye  ? FOOT FRACTURE SURGERY    ? OPEN REDUCTION INTERNAL FIXATION (ORIF) DISTAL RADIAL FRACTURE Left 03/29/2017  ? Procedure: OPEN REDUCTION INTERNAL FIXATION (ORIF)LEFT  DISTAL RADIAL FRACTURE;  Surgeon: Leanora Cover, MD;  Location: [place];  Service: Orthopedics;  Laterality: Left;  ? POLYPECTOMY  11/05/2019  ? Procedure: POLYPECTOMY;  Surgeon: Otis Brace, MD;  Location: [facility] ENDOSCOPY;  Service: Gastroenterology;;  ? THYROIDECTOMY    ? TONSILLECTOMY    ? WISDOM TOOTH EXTRACTION    ? ?Patient Active Problem List  ? Diagnosis Date Noted  ? Hypertensive crisis 10/23/2019  ? History of cerebral aneurysm 10/23/2019  ? 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 Hypoxemia 02/21/2019  ? Pulmonary edema 02/19/2019  ? Encounter for immunization 09/26/2018  ?  Anemia in chronic kidney disease 08/25/2018  ? End stage renal disease ([place]) 08/25/2018  ? Secondary hyperparathyroidism of renal origin ([place]) 08/22/2018  ? Referred otalgia of left ear 08/16/2018  ? Iron deficiency anemia, unspecified 07/12/2018  ? Shortness of breath 07/06/2018  ? Anemia, unspecified 07/05/2018  ? Anxiety  07/05/2018  ? 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 Acute renal failure (ARF) ([place]) 06/22/2018  ? 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 Abdominal pain 06/06/2018  ? Hydronephrosis due to obstruction of ureteral orifice 06/06/2018  ? Disease of thyroid gland 04/17/2018  ? Hyperlipidemia 04/17/2018  ? Hypertension 04/17/2018  ? De Quervain's tenosynovitis 02/10/2018  ? Trigger finger of left thumb 02/10/2018  ? Chronic low back pain 04/12/2017  ? Chronic pain syndrome 04/12/2017  ? Degeneration of lumbar intervertebral disc 03/01/2017  ? Scoliosis of lumbar spine 03/01/2017  ? Sacral back pain 05/25/2016  ? Eye pain 10/30/2014  ? ? ?PCP: Lajean Manes, MD ? ?REFERRING PROVIDER: Lajean Manes, MD ? ?REFERRING DIAG: physical deconditioning ? ?THERAPY DIAG:  ?No diagnosis found. ? ?ONSET DATE: *** ? ?SUBJECTIVE:  ? ?SUBJECTIVE STATEMENT: ?*** ? ?PERTINENT HISTORY: ?*** ? ?PAIN:  ?Are you having pain? {OPRCPAIN:27236} ? ?PRECAUTIONS: {Therapy precautions:24002} ? ?WEIGHT BEARING RESTRICTIONS {Yes ***/No:24003} ? ?FALLS:  ?Has patient fallen in last 6 months? {fallsyesno:27318} ? ?LIVING ENVIRONMENT: ?Lives with: {OPRC lives with:25569::"lives with their family"} ?Lives in: {Lives in:25570} ?Stairs: {opstairs:27293} ?Has following equipment at home: {Assistive devices:23999} ? ?OCCUPATION: *** ? ?PLOF: {PLOF:24004} ? ?PATIENT GOALS *** ? ? ?OBJECTIVE:  ? ?DIAGNOSTIC FINDINGS: *** ? ?PATIENT SURVEYS:  ?FOTO *** ? ?COGNITION: ? Overall cognitive status: Within functional limits for tasks assessed   ?  ?SENSATION: ?WFL ? ? ?POSTURE:  ?*** ? ?PALPATION: ?*** ? ?LE ROM: ? ?Active ROM Right ?04/27/2021 Left ?04/27/2021  ?Hip flexion    ?Hip extension    ?Hip abduction    ?Hip adduction    ?Hip internal rotation    ?Hip external rotation    ?Knee flexion    ?Knee extension    ?Ankle dorsiflexion    ?Ankle plantarflexion    ?Ankle inversion    ?Ankle eversion    ? (Blank rows =  not tested) ? ?LE MMT: ? ?MMT Right ?04/27/2021 Left ?04/27/2021  ?Hip flexion    ?Hip extension    ?Hip abduction    ?Hip adduction    ?Hip internal rotation    ?Hip external rotation    ?Knee flexion    ?Knee extension    ?Ankle dorsiflexion    ?Ankle plantarflexion    ?Ankle inversion    ?Ankle eversion    ? (Blank rows = not tested) ? ?LOWER EXTREMITY SPECIAL TESTS:  ?{LEspecialtests:26242} ? ?FUNCTIONAL TESTS:  ?5 times sit to stand: *** ?Timed up and go (TUG): *** ?2 minute walk test: *** ? ?GAIT: ?Distance walked: *** ?Assistive device utilized: {Assistive devices:23999} ?Level of assistance: {Levels of assistance:24026} ?Comments: *** ? ? ? ?TODAY'S TREATMENT: ?Discussed findings in evaluation and HEP. ? ? ?PATIENT EDUCATION:  ?Education details: Discussed findings in evaluation and HEP. ?Person educated: Patient ?Education method: Explanation, Demonstration, Tactile cues, Verbal cues, and Handouts ?Education comprehension: verbalized understanding ? ? ?HOME EXERCISE PROGRAM: ?*** ? ?ASSESSMENT: ? ?CLINICAL IMPRESSION: ?Patient is a 71 y.o. female who was seen today for physical therapy evaluation and treatment for physical deconditioning.  ? ? ?OBJECTIVE IMPAIRMENTS {opptimpairments:25111}.  ? ?ACTIVITY LIMITATIONS {activity limitations:25113}.  ? ?PERSONAL FACTORS {Personal factors:25162} are also  affecting patient's functional outcome.  ? ? ?REHAB POTENTIAL: {rehabpotential:25112} ? ?CLINICAL DECISION MAKING: {clinical decision making:25114} ? ?EVALUATION COMPLEXITY: {Evaluation complexity:25115} ? ? ?GOALS: ? ?SHORT TERM GOALS: Target date: 05/11/2021 ? ?Patient will be independent with HEP for PT progression. ?Baseline: initial HEP addressed ?Goal status: INITIAL ? ? ?LONG TERM GOALS: Target date: {follow up:25551} ? ?Patient will score *** on FOTO. ?Baseline:  ?Goal status: INITIAL ? ?2.  *** ?Baseline:  ?Goal status: INITIAL ? ?3.  *** ?Baseline:  ?Goal status: INITIAL ? ?4.  *** ?Baseline:  ?Goal status:  INITIAL ? ?5.  *** ?Baseline:  ?Goal status: INITIAL ? ?6.  *** ?Baseline:  ?Goal status: INITIAL ? ? ?PLAN: ?PT FREQUENCY: {rehab frequency:25116} ? ?PT DURATION: {rehab duration:25117} ? ?PLANNED INTE

## 2021-04-28 DIAGNOSIS — M9901 Segmental and somatic dysfunction of cervical region: Secondary | ICD-10-CM | POA: Diagnosis not present

## 2021-04-28 DIAGNOSIS — M9903 Segmental and somatic dysfunction of lumbar region: Secondary | ICD-10-CM | POA: Diagnosis not present

## 2021-04-28 DIAGNOSIS — M9902 Segmental and somatic dysfunction of thoracic region: Secondary | ICD-10-CM | POA: Diagnosis not present

## 2021-04-28 DIAGNOSIS — M9905 Segmental and somatic dysfunction of pelvic region: Secondary | ICD-10-CM | POA: Diagnosis not present

## 2021-04-29 DIAGNOSIS — M9905 Segmental and somatic dysfunction of pelvic region: Secondary | ICD-10-CM | POA: Diagnosis not present

## 2021-04-29 DIAGNOSIS — M9902 Segmental and somatic dysfunction of thoracic region: Secondary | ICD-10-CM | POA: Diagnosis not present

## 2021-04-29 DIAGNOSIS — M9903 Segmental and somatic dysfunction of lumbar region: Secondary | ICD-10-CM | POA: Diagnosis not present

## 2021-04-29 DIAGNOSIS — M9901 Segmental and somatic dysfunction of cervical region: Secondary | ICD-10-CM | POA: Diagnosis not present

## 2021-04-30 ENCOUNTER — Ambulatory Visit: Payer: Medicare HMO

## 2021-04-30 DIAGNOSIS — M9905 Segmental and somatic dysfunction of pelvic region: Secondary | ICD-10-CM | POA: Diagnosis not present

## 2021-04-30 DIAGNOSIS — M9902 Segmental and somatic dysfunction of thoracic region: Secondary | ICD-10-CM | POA: Diagnosis not present

## 2021-04-30 DIAGNOSIS — M9903 Segmental and somatic dysfunction of lumbar region: Secondary | ICD-10-CM | POA: Diagnosis not present

## 2021-04-30 DIAGNOSIS — M9901 Segmental and somatic dysfunction of cervical region: Secondary | ICD-10-CM | POA: Diagnosis not present

## 2021-05-01 DIAGNOSIS — M9903 Segmental and somatic dysfunction of lumbar region: Secondary | ICD-10-CM | POA: Diagnosis not present

## 2021-05-01 DIAGNOSIS — M9902 Segmental and somatic dysfunction of thoracic region: Secondary | ICD-10-CM | POA: Diagnosis not present

## 2021-05-01 DIAGNOSIS — M9905 Segmental and somatic dysfunction of pelvic region: Secondary | ICD-10-CM | POA: Diagnosis not present

## 2021-05-01 DIAGNOSIS — M9901 Segmental and somatic dysfunction of cervical region: Secondary | ICD-10-CM | POA: Diagnosis not present

## 2021-05-04 DIAGNOSIS — Z940 Kidney transplant status: Secondary | ICD-10-CM | POA: Diagnosis not present

## 2021-05-04 DIAGNOSIS — G4700 Insomnia, unspecified: Secondary | ICD-10-CM | POA: Diagnosis not present

## 2021-05-04 DIAGNOSIS — Z79899 Other long term (current) drug therapy: Secondary | ICD-10-CM | POA: Diagnosis not present

## 2021-05-04 DIAGNOSIS — I10 Essential (primary) hypertension: Secondary | ICD-10-CM | POA: Diagnosis not present

## 2021-05-04 DIAGNOSIS — D84821 Immunodeficiency due to drugs: Secondary | ICD-10-CM | POA: Diagnosis not present

## 2021-05-04 DIAGNOSIS — Z4822 Encounter for aftercare following kidney transplant: Secondary | ICD-10-CM | POA: Diagnosis not present

## 2021-05-04 DIAGNOSIS — R5383 Other fatigue: Secondary | ICD-10-CM | POA: Diagnosis not present

## 2021-05-04 DIAGNOSIS — D849 Immunodeficiency, unspecified: Secondary | ICD-10-CM | POA: Diagnosis not present

## 2021-05-04 DIAGNOSIS — Z87891 Personal history of nicotine dependence: Secondary | ICD-10-CM | POA: Diagnosis not present

## 2021-05-05 DIAGNOSIS — M9901 Segmental and somatic dysfunction of cervical region: Secondary | ICD-10-CM | POA: Diagnosis not present

## 2021-05-05 DIAGNOSIS — M9905 Segmental and somatic dysfunction of pelvic region: Secondary | ICD-10-CM | POA: Diagnosis not present

## 2021-05-05 DIAGNOSIS — M9902 Segmental and somatic dysfunction of thoracic region: Secondary | ICD-10-CM | POA: Diagnosis not present

## 2021-05-05 DIAGNOSIS — M9903 Segmental and somatic dysfunction of lumbar region: Secondary | ICD-10-CM | POA: Diagnosis not present

## 2021-05-06 DIAGNOSIS — M9902 Segmental and somatic dysfunction of thoracic region: Secondary | ICD-10-CM | POA: Diagnosis not present

## 2021-05-06 DIAGNOSIS — M9905 Segmental and somatic dysfunction of pelvic region: Secondary | ICD-10-CM | POA: Diagnosis not present

## 2021-05-06 DIAGNOSIS — M9901 Segmental and somatic dysfunction of cervical region: Secondary | ICD-10-CM | POA: Diagnosis not present

## 2021-05-06 DIAGNOSIS — M9903 Segmental and somatic dysfunction of lumbar region: Secondary | ICD-10-CM | POA: Diagnosis not present

## 2021-05-08 DIAGNOSIS — M9903 Segmental and somatic dysfunction of lumbar region: Secondary | ICD-10-CM | POA: Diagnosis not present

## 2021-05-08 DIAGNOSIS — M9902 Segmental and somatic dysfunction of thoracic region: Secondary | ICD-10-CM | POA: Diagnosis not present

## 2021-05-08 DIAGNOSIS — M9905 Segmental and somatic dysfunction of pelvic region: Secondary | ICD-10-CM | POA: Diagnosis not present

## 2021-05-08 DIAGNOSIS — M9901 Segmental and somatic dysfunction of cervical region: Secondary | ICD-10-CM | POA: Diagnosis not present

## 2021-05-11 DIAGNOSIS — M9902 Segmental and somatic dysfunction of thoracic region: Secondary | ICD-10-CM | POA: Diagnosis not present

## 2021-05-11 DIAGNOSIS — M9905 Segmental and somatic dysfunction of pelvic region: Secondary | ICD-10-CM | POA: Diagnosis not present

## 2021-05-11 DIAGNOSIS — M9901 Segmental and somatic dysfunction of cervical region: Secondary | ICD-10-CM | POA: Diagnosis not present

## 2021-05-11 DIAGNOSIS — M9903 Segmental and somatic dysfunction of lumbar region: Secondary | ICD-10-CM | POA: Diagnosis not present

## 2021-05-13 DIAGNOSIS — M9903 Segmental and somatic dysfunction of lumbar region: Secondary | ICD-10-CM | POA: Diagnosis not present

## 2021-05-13 DIAGNOSIS — M9905 Segmental and somatic dysfunction of pelvic region: Secondary | ICD-10-CM | POA: Diagnosis not present

## 2021-05-13 DIAGNOSIS — M9901 Segmental and somatic dysfunction of cervical region: Secondary | ICD-10-CM | POA: Diagnosis not present

## 2021-05-13 DIAGNOSIS — M9902 Segmental and somatic dysfunction of thoracic region: Secondary | ICD-10-CM | POA: Diagnosis not present

## 2021-05-15 DIAGNOSIS — M9902 Segmental and somatic dysfunction of thoracic region: Secondary | ICD-10-CM | POA: Diagnosis not present

## 2021-05-15 DIAGNOSIS — M9903 Segmental and somatic dysfunction of lumbar region: Secondary | ICD-10-CM | POA: Diagnosis not present

## 2021-05-15 DIAGNOSIS — M9905 Segmental and somatic dysfunction of pelvic region: Secondary | ICD-10-CM | POA: Diagnosis not present

## 2021-05-15 DIAGNOSIS — M9901 Segmental and somatic dysfunction of cervical region: Secondary | ICD-10-CM | POA: Diagnosis not present

## 2021-05-18 DIAGNOSIS — M9901 Segmental and somatic dysfunction of cervical region: Secondary | ICD-10-CM | POA: Diagnosis not present

## 2021-05-18 DIAGNOSIS — M9902 Segmental and somatic dysfunction of thoracic region: Secondary | ICD-10-CM | POA: Diagnosis not present

## 2021-05-18 DIAGNOSIS — M9905 Segmental and somatic dysfunction of pelvic region: Secondary | ICD-10-CM | POA: Diagnosis not present

## 2021-05-18 DIAGNOSIS — M9903 Segmental and somatic dysfunction of lumbar region: Secondary | ICD-10-CM | POA: Diagnosis not present

## 2021-05-20 DIAGNOSIS — M9903 Segmental and somatic dysfunction of lumbar region: Secondary | ICD-10-CM | POA: Diagnosis not present

## 2021-05-20 DIAGNOSIS — M9902 Segmental and somatic dysfunction of thoracic region: Secondary | ICD-10-CM | POA: Diagnosis not present

## 2021-05-20 DIAGNOSIS — M9901 Segmental and somatic dysfunction of cervical region: Secondary | ICD-10-CM | POA: Diagnosis not present

## 2021-05-20 DIAGNOSIS — M9905 Segmental and somatic dysfunction of pelvic region: Secondary | ICD-10-CM | POA: Diagnosis not present

## 2021-05-22 DIAGNOSIS — M9901 Segmental and somatic dysfunction of cervical region: Secondary | ICD-10-CM | POA: Diagnosis not present

## 2021-05-22 DIAGNOSIS — M9902 Segmental and somatic dysfunction of thoracic region: Secondary | ICD-10-CM | POA: Diagnosis not present

## 2021-05-22 DIAGNOSIS — M9903 Segmental and somatic dysfunction of lumbar region: Secondary | ICD-10-CM | POA: Diagnosis not present

## 2021-05-22 DIAGNOSIS — M9905 Segmental and somatic dysfunction of pelvic region: Secondary | ICD-10-CM | POA: Diagnosis not present

## 2021-05-25 DIAGNOSIS — M9903 Segmental and somatic dysfunction of lumbar region: Secondary | ICD-10-CM | POA: Diagnosis not present

## 2021-05-25 DIAGNOSIS — M9905 Segmental and somatic dysfunction of pelvic region: Secondary | ICD-10-CM | POA: Diagnosis not present

## 2021-05-25 DIAGNOSIS — M9901 Segmental and somatic dysfunction of cervical region: Secondary | ICD-10-CM | POA: Diagnosis not present

## 2021-05-25 DIAGNOSIS — M9902 Segmental and somatic dysfunction of thoracic region: Secondary | ICD-10-CM | POA: Diagnosis not present

## 2021-05-27 DIAGNOSIS — M9903 Segmental and somatic dysfunction of lumbar region: Secondary | ICD-10-CM | POA: Diagnosis not present

## 2021-05-27 DIAGNOSIS — M9902 Segmental and somatic dysfunction of thoracic region: Secondary | ICD-10-CM | POA: Diagnosis not present

## 2021-05-27 DIAGNOSIS — M9905 Segmental and somatic dysfunction of pelvic region: Secondary | ICD-10-CM | POA: Diagnosis not present

## 2021-05-27 DIAGNOSIS — M9901 Segmental and somatic dysfunction of cervical region: Secondary | ICD-10-CM | POA: Diagnosis not present

## 2021-06-03 DIAGNOSIS — M9903 Segmental and somatic dysfunction of lumbar region: Secondary | ICD-10-CM | POA: Diagnosis not present

## 2021-06-03 DIAGNOSIS — M9901 Segmental and somatic dysfunction of cervical region: Secondary | ICD-10-CM | POA: Diagnosis not present

## 2021-06-03 DIAGNOSIS — M9905 Segmental and somatic dysfunction of pelvic region: Secondary | ICD-10-CM | POA: Diagnosis not present

## 2021-06-03 DIAGNOSIS — M9902 Segmental and somatic dysfunction of thoracic region: Secondary | ICD-10-CM | POA: Diagnosis not present

## 2021-06-04 DIAGNOSIS — Z940 Kidney transplant status: Secondary | ICD-10-CM | POA: Diagnosis not present

## 2021-06-04 DIAGNOSIS — Z5181 Encounter for therapeutic drug level monitoring: Secondary | ICD-10-CM | POA: Diagnosis not present

## 2021-06-10 DIAGNOSIS — I150 Renovascular hypertension: Secondary | ICD-10-CM | POA: Diagnosis not present

## 2021-06-10 DIAGNOSIS — Z940 Kidney transplant status: Secondary | ICD-10-CM | POA: Diagnosis not present

## 2021-06-10 DIAGNOSIS — D849 Immunodeficiency, unspecified: Secondary | ICD-10-CM | POA: Diagnosis not present

## 2021-06-10 DIAGNOSIS — K219 Gastro-esophageal reflux disease without esophagitis: Secondary | ICD-10-CM | POA: Diagnosis not present

## 2021-06-10 DIAGNOSIS — M310 Hypersensitivity angiitis: Secondary | ICD-10-CM | POA: Diagnosis not present

## 2021-06-10 DIAGNOSIS — K521 Toxic gastroenteritis and colitis: Secondary | ICD-10-CM | POA: Diagnosis not present

## 2021-06-17 DIAGNOSIS — M9903 Segmental and somatic dysfunction of lumbar region: Secondary | ICD-10-CM | POA: Diagnosis not present

## 2021-06-17 DIAGNOSIS — M9905 Segmental and somatic dysfunction of pelvic region: Secondary | ICD-10-CM | POA: Diagnosis not present

## 2021-06-17 DIAGNOSIS — M9901 Segmental and somatic dysfunction of cervical region: Secondary | ICD-10-CM | POA: Diagnosis not present

## 2021-06-17 DIAGNOSIS — M9902 Segmental and somatic dysfunction of thoracic region: Secondary | ICD-10-CM | POA: Diagnosis not present

## 2021-06-18 DIAGNOSIS — R1013 Epigastric pain: Secondary | ICD-10-CM | POA: Diagnosis not present

## 2021-06-19 DIAGNOSIS — M9901 Segmental and somatic dysfunction of cervical region: Secondary | ICD-10-CM | POA: Diagnosis not present

## 2021-06-19 DIAGNOSIS — M9905 Segmental and somatic dysfunction of pelvic region: Secondary | ICD-10-CM | POA: Diagnosis not present

## 2021-06-19 DIAGNOSIS — M9903 Segmental and somatic dysfunction of lumbar region: Secondary | ICD-10-CM | POA: Diagnosis not present

## 2021-06-19 DIAGNOSIS — M9902 Segmental and somatic dysfunction of thoracic region: Secondary | ICD-10-CM | POA: Diagnosis not present

## 2021-06-22 DIAGNOSIS — Z5181 Encounter for therapeutic drug level monitoring: Secondary | ICD-10-CM | POA: Diagnosis not present

## 2021-06-22 DIAGNOSIS — D849 Immunodeficiency, unspecified: Secondary | ICD-10-CM | POA: Diagnosis not present

## 2021-06-22 DIAGNOSIS — R197 Diarrhea, unspecified: Secondary | ICD-10-CM | POA: Diagnosis not present

## 2021-06-22 DIAGNOSIS — Z940 Kidney transplant status: Secondary | ICD-10-CM | POA: Diagnosis not present

## 2021-07-03 DIAGNOSIS — Z940 Kidney transplant status: Secondary | ICD-10-CM | POA: Diagnosis not present

## 2021-07-03 DIAGNOSIS — Z5181 Encounter for therapeutic drug level monitoring: Secondary | ICD-10-CM | POA: Diagnosis not present

## 2021-07-03 DIAGNOSIS — D849 Immunodeficiency, unspecified: Secondary | ICD-10-CM | POA: Diagnosis not present

## 2021-07-13 DIAGNOSIS — D849 Immunodeficiency, unspecified: Secondary | ICD-10-CM | POA: Diagnosis not present

## 2021-07-13 DIAGNOSIS — M9902 Segmental and somatic dysfunction of thoracic region: Secondary | ICD-10-CM | POA: Diagnosis not present

## 2021-07-13 DIAGNOSIS — Z5181 Encounter for therapeutic drug level monitoring: Secondary | ICD-10-CM | POA: Diagnosis not present

## 2021-07-13 DIAGNOSIS — Z940 Kidney transplant status: Secondary | ICD-10-CM | POA: Diagnosis not present

## 2021-07-13 DIAGNOSIS — M9905 Segmental and somatic dysfunction of pelvic region: Secondary | ICD-10-CM | POA: Diagnosis not present

## 2021-07-13 DIAGNOSIS — M9903 Segmental and somatic dysfunction of lumbar region: Secondary | ICD-10-CM | POA: Diagnosis not present

## 2021-07-13 DIAGNOSIS — M9901 Segmental and somatic dysfunction of cervical region: Secondary | ICD-10-CM | POA: Diagnosis not present

## 2021-07-14 DIAGNOSIS — D849 Immunodeficiency, unspecified: Secondary | ICD-10-CM | POA: Diagnosis not present

## 2021-07-14 DIAGNOSIS — D702 Other drug-induced agranulocytosis: Secondary | ICD-10-CM | POA: Diagnosis not present

## 2021-07-14 DIAGNOSIS — B259 Cytomegaloviral disease, unspecified: Secondary | ICD-10-CM | POA: Diagnosis not present

## 2021-07-14 DIAGNOSIS — Z940 Kidney transplant status: Secondary | ICD-10-CM | POA: Diagnosis not present

## 2021-07-14 DIAGNOSIS — B258 Other cytomegaloviral diseases: Secondary | ICD-10-CM | POA: Diagnosis not present

## 2021-07-15 DIAGNOSIS — R1084 Generalized abdominal pain: Secondary | ICD-10-CM | POA: Diagnosis not present

## 2021-07-15 DIAGNOSIS — Z9049 Acquired absence of other specified parts of digestive tract: Secondary | ICD-10-CM | POA: Diagnosis not present

## 2021-07-15 DIAGNOSIS — E039 Hypothyroidism, unspecified: Secondary | ICD-10-CM | POA: Diagnosis not present

## 2021-07-15 DIAGNOSIS — M310 Hypersensitivity angiitis: Secondary | ICD-10-CM | POA: Diagnosis not present

## 2021-07-15 DIAGNOSIS — G8929 Other chronic pain: Secondary | ICD-10-CM | POA: Diagnosis not present

## 2021-07-15 DIAGNOSIS — Z881 Allergy status to other antibiotic agents status: Secondary | ICD-10-CM | POA: Diagnosis not present

## 2021-07-15 DIAGNOSIS — M5430 Sciatica, unspecified side: Secondary | ICD-10-CM | POA: Diagnosis not present

## 2021-07-15 DIAGNOSIS — D84821 Immunodeficiency due to drugs: Secondary | ICD-10-CM | POA: Diagnosis not present

## 2021-07-15 DIAGNOSIS — K219 Gastro-esophageal reflux disease without esophagitis: Secondary | ICD-10-CM | POA: Diagnosis not present

## 2021-07-15 DIAGNOSIS — I10 Essential (primary) hypertension: Secondary | ICD-10-CM | POA: Diagnosis not present

## 2021-07-15 DIAGNOSIS — B259 Cytomegaloviral disease, unspecified: Secondary | ICD-10-CM | POA: Diagnosis not present

## 2021-07-15 DIAGNOSIS — K5909 Other constipation: Secondary | ICD-10-CM | POA: Diagnosis not present

## 2021-07-15 DIAGNOSIS — D709 Neutropenia, unspecified: Secondary | ICD-10-CM | POA: Diagnosis not present

## 2021-07-15 DIAGNOSIS — Z87891 Personal history of nicotine dependence: Secondary | ICD-10-CM | POA: Diagnosis not present

## 2021-07-15 DIAGNOSIS — Z8711 Personal history of peptic ulcer disease: Secondary | ICD-10-CM | POA: Diagnosis not present

## 2021-07-16 DIAGNOSIS — D849 Immunodeficiency, unspecified: Secondary | ICD-10-CM | POA: Diagnosis not present

## 2021-07-16 DIAGNOSIS — D729 Disorder of white blood cells, unspecified: Secondary | ICD-10-CM | POA: Diagnosis not present

## 2021-07-16 DIAGNOSIS — Z940 Kidney transplant status: Secondary | ICD-10-CM | POA: Diagnosis not present

## 2021-07-16 DIAGNOSIS — B259 Cytomegaloviral disease, unspecified: Secondary | ICD-10-CM | POA: Diagnosis not present

## 2021-07-16 DIAGNOSIS — D702 Other drug-induced agranulocytosis: Secondary | ICD-10-CM | POA: Diagnosis not present

## 2021-07-17 DIAGNOSIS — Z452 Encounter for adjustment and management of vascular access device: Secondary | ICD-10-CM | POA: Diagnosis not present

## 2021-07-17 DIAGNOSIS — K219 Gastro-esophageal reflux disease without esophagitis: Secondary | ICD-10-CM | POA: Diagnosis not present

## 2021-07-17 DIAGNOSIS — N189 Chronic kidney disease, unspecified: Secondary | ICD-10-CM | POA: Diagnosis not present

## 2021-07-17 DIAGNOSIS — Z87891 Personal history of nicotine dependence: Secondary | ICD-10-CM | POA: Diagnosis not present

## 2021-07-17 DIAGNOSIS — Z7952 Long term (current) use of systemic steroids: Secondary | ICD-10-CM | POA: Diagnosis not present

## 2021-07-17 DIAGNOSIS — E039 Hypothyroidism, unspecified: Secondary | ICD-10-CM | POA: Diagnosis not present

## 2021-07-17 DIAGNOSIS — Z79621 Long term (current) use of calcineurin inhibitor: Secondary | ICD-10-CM | POA: Diagnosis not present

## 2021-07-17 DIAGNOSIS — B259 Cytomegaloviral disease, unspecified: Secondary | ICD-10-CM | POA: Diagnosis not present

## 2021-07-17 DIAGNOSIS — G8929 Other chronic pain: Secondary | ICD-10-CM | POA: Diagnosis not present

## 2021-07-17 DIAGNOSIS — Z940 Kidney transplant status: Secondary | ICD-10-CM | POA: Diagnosis not present

## 2021-07-17 DIAGNOSIS — M5430 Sciatica, unspecified side: Secondary | ICD-10-CM | POA: Diagnosis not present

## 2021-07-17 DIAGNOSIS — E785 Hyperlipidemia, unspecified: Secondary | ICD-10-CM | POA: Diagnosis not present

## 2021-07-17 DIAGNOSIS — I129 Hypertensive chronic kidney disease with stage 1 through stage 4 chronic kidney disease, or unspecified chronic kidney disease: Secondary | ICD-10-CM | POA: Diagnosis not present

## 2021-07-17 DIAGNOSIS — D709 Neutropenia, unspecified: Secondary | ICD-10-CM | POA: Diagnosis not present

## 2021-07-17 DIAGNOSIS — Z7982 Long term (current) use of aspirin: Secondary | ICD-10-CM | POA: Diagnosis not present

## 2021-07-17 DIAGNOSIS — Z87442 Personal history of urinary calculi: Secondary | ICD-10-CM | POA: Diagnosis not present

## 2021-07-17 DIAGNOSIS — K5909 Other constipation: Secondary | ICD-10-CM | POA: Diagnosis not present

## 2021-07-17 DIAGNOSIS — D849 Immunodeficiency, unspecified: Secondary | ICD-10-CM | POA: Diagnosis not present

## 2021-07-17 DIAGNOSIS — M310 Hypersensitivity angiitis: Secondary | ICD-10-CM | POA: Diagnosis not present

## 2021-07-17 DIAGNOSIS — Z8711 Personal history of peptic ulcer disease: Secondary | ICD-10-CM | POA: Diagnosis not present

## 2021-07-18 DIAGNOSIS — B259 Cytomegaloviral disease, unspecified: Secondary | ICD-10-CM | POA: Diagnosis not present

## 2021-07-21 DIAGNOSIS — E785 Hyperlipidemia, unspecified: Secondary | ICD-10-CM | POA: Diagnosis not present

## 2021-07-21 DIAGNOSIS — I129 Hypertensive chronic kidney disease with stage 1 through stage 4 chronic kidney disease, or unspecified chronic kidney disease: Secondary | ICD-10-CM | POA: Diagnosis not present

## 2021-07-21 DIAGNOSIS — Z87442 Personal history of urinary calculi: Secondary | ICD-10-CM | POA: Diagnosis not present

## 2021-07-21 DIAGNOSIS — E039 Hypothyroidism, unspecified: Secondary | ICD-10-CM | POA: Diagnosis not present

## 2021-07-21 DIAGNOSIS — M310 Hypersensitivity angiitis: Secondary | ICD-10-CM | POA: Diagnosis not present

## 2021-07-21 DIAGNOSIS — M5430 Sciatica, unspecified side: Secondary | ICD-10-CM | POA: Diagnosis not present

## 2021-07-21 DIAGNOSIS — B259 Cytomegaloviral disease, unspecified: Secondary | ICD-10-CM | POA: Diagnosis not present

## 2021-07-21 DIAGNOSIS — Z87891 Personal history of nicotine dependence: Secondary | ICD-10-CM | POA: Diagnosis not present

## 2021-07-21 DIAGNOSIS — K5909 Other constipation: Secondary | ICD-10-CM | POA: Diagnosis not present

## 2021-07-21 DIAGNOSIS — K219 Gastro-esophageal reflux disease without esophagitis: Secondary | ICD-10-CM | POA: Diagnosis not present

## 2021-07-21 DIAGNOSIS — Z7952 Long term (current) use of systemic steroids: Secondary | ICD-10-CM | POA: Diagnosis not present

## 2021-07-21 DIAGNOSIS — Z79621 Long term (current) use of calcineurin inhibitor: Secondary | ICD-10-CM | POA: Diagnosis not present

## 2021-07-21 DIAGNOSIS — D849 Immunodeficiency, unspecified: Secondary | ICD-10-CM | POA: Diagnosis not present

## 2021-07-21 DIAGNOSIS — Z452 Encounter for adjustment and management of vascular access device: Secondary | ICD-10-CM | POA: Diagnosis not present

## 2021-07-21 DIAGNOSIS — N189 Chronic kidney disease, unspecified: Secondary | ICD-10-CM | POA: Diagnosis not present

## 2021-07-21 DIAGNOSIS — G8929 Other chronic pain: Secondary | ICD-10-CM | POA: Diagnosis not present

## 2021-07-21 DIAGNOSIS — D709 Neutropenia, unspecified: Secondary | ICD-10-CM | POA: Diagnosis not present

## 2021-07-21 DIAGNOSIS — Z940 Kidney transplant status: Secondary | ICD-10-CM | POA: Diagnosis not present

## 2021-07-21 DIAGNOSIS — Z8711 Personal history of peptic ulcer disease: Secondary | ICD-10-CM | POA: Diagnosis not present

## 2021-07-21 DIAGNOSIS — Z7982 Long term (current) use of aspirin: Secondary | ICD-10-CM | POA: Diagnosis not present

## 2021-07-22 DIAGNOSIS — Z5181 Encounter for therapeutic drug level monitoring: Secondary | ICD-10-CM | POA: Diagnosis not present

## 2021-07-22 DIAGNOSIS — D849 Immunodeficiency, unspecified: Secondary | ICD-10-CM | POA: Diagnosis not present

## 2021-07-22 DIAGNOSIS — Z940 Kidney transplant status: Secondary | ICD-10-CM | POA: Diagnosis not present

## 2021-07-23 DIAGNOSIS — R1013 Epigastric pain: Secondary | ICD-10-CM | POA: Diagnosis not present

## 2021-07-23 DIAGNOSIS — A0472 Enterocolitis due to Clostridium difficile, not specified as recurrent: Secondary | ICD-10-CM | POA: Diagnosis not present

## 2021-07-23 DIAGNOSIS — B259 Cytomegaloviral disease, unspecified: Secondary | ICD-10-CM | POA: Diagnosis not present

## 2021-07-24 DIAGNOSIS — R197 Diarrhea, unspecified: Secondary | ICD-10-CM | POA: Diagnosis not present

## 2021-07-24 DIAGNOSIS — B259 Cytomegaloviral disease, unspecified: Secondary | ICD-10-CM | POA: Diagnosis not present

## 2021-07-27 DIAGNOSIS — D638 Anemia in other chronic diseases classified elsewhere: Secondary | ICD-10-CM | POA: Diagnosis not present

## 2021-07-27 DIAGNOSIS — Z79899 Other long term (current) drug therapy: Secondary | ICD-10-CM | POA: Diagnosis not present

## 2021-07-27 DIAGNOSIS — A0472 Enterocolitis due to Clostridium difficile, not specified as recurrent: Secondary | ICD-10-CM | POA: Diagnosis not present

## 2021-07-27 DIAGNOSIS — Z940 Kidney transplant status: Secondary | ICD-10-CM | POA: Diagnosis not present

## 2021-07-27 DIAGNOSIS — B259 Cytomegaloviral disease, unspecified: Secondary | ICD-10-CM | POA: Diagnosis not present

## 2021-07-28 DIAGNOSIS — Z8711 Personal history of peptic ulcer disease: Secondary | ICD-10-CM | POA: Diagnosis not present

## 2021-07-28 DIAGNOSIS — K219 Gastro-esophageal reflux disease without esophagitis: Secondary | ICD-10-CM | POA: Diagnosis not present

## 2021-07-28 DIAGNOSIS — N189 Chronic kidney disease, unspecified: Secondary | ICD-10-CM | POA: Diagnosis not present

## 2021-07-28 DIAGNOSIS — I129 Hypertensive chronic kidney disease with stage 1 through stage 4 chronic kidney disease, or unspecified chronic kidney disease: Secondary | ICD-10-CM | POA: Diagnosis not present

## 2021-07-28 DIAGNOSIS — D849 Immunodeficiency, unspecified: Secondary | ICD-10-CM | POA: Diagnosis not present

## 2021-07-28 DIAGNOSIS — K5909 Other constipation: Secondary | ICD-10-CM | POA: Diagnosis not present

## 2021-07-28 DIAGNOSIS — D709 Neutropenia, unspecified: Secondary | ICD-10-CM | POA: Diagnosis not present

## 2021-07-28 DIAGNOSIS — Z940 Kidney transplant status: Secondary | ICD-10-CM | POA: Diagnosis not present

## 2021-07-28 DIAGNOSIS — Z7952 Long term (current) use of systemic steroids: Secondary | ICD-10-CM | POA: Diagnosis not present

## 2021-07-28 DIAGNOSIS — Z7982 Long term (current) use of aspirin: Secondary | ICD-10-CM | POA: Diagnosis not present

## 2021-07-28 DIAGNOSIS — M310 Hypersensitivity angiitis: Secondary | ICD-10-CM | POA: Diagnosis not present

## 2021-07-28 DIAGNOSIS — E039 Hypothyroidism, unspecified: Secondary | ICD-10-CM | POA: Diagnosis not present

## 2021-07-28 DIAGNOSIS — Z79621 Long term (current) use of calcineurin inhibitor: Secondary | ICD-10-CM | POA: Diagnosis not present

## 2021-07-28 DIAGNOSIS — E785 Hyperlipidemia, unspecified: Secondary | ICD-10-CM | POA: Diagnosis not present

## 2021-07-28 DIAGNOSIS — Z452 Encounter for adjustment and management of vascular access device: Secondary | ICD-10-CM | POA: Diagnosis not present

## 2021-07-28 DIAGNOSIS — M5430 Sciatica, unspecified side: Secondary | ICD-10-CM | POA: Diagnosis not present

## 2021-07-28 DIAGNOSIS — Z87891 Personal history of nicotine dependence: Secondary | ICD-10-CM | POA: Diagnosis not present

## 2021-07-28 DIAGNOSIS — G8929 Other chronic pain: Secondary | ICD-10-CM | POA: Diagnosis not present

## 2021-07-28 DIAGNOSIS — Z87442 Personal history of urinary calculi: Secondary | ICD-10-CM | POA: Diagnosis not present

## 2021-07-28 DIAGNOSIS — B259 Cytomegaloviral disease, unspecified: Secondary | ICD-10-CM | POA: Diagnosis not present

## 2021-07-29 DIAGNOSIS — H35371 Puckering of macula, right eye: Secondary | ICD-10-CM | POA: Diagnosis not present

## 2021-07-29 DIAGNOSIS — H401121 Primary open-angle glaucoma, left eye, mild stage: Secondary | ICD-10-CM | POA: Diagnosis not present

## 2021-07-29 DIAGNOSIS — H43813 Vitreous degeneration, bilateral: Secondary | ICD-10-CM | POA: Diagnosis not present

## 2021-07-29 DIAGNOSIS — H16223 Keratoconjunctivitis sicca, not specified as Sjogren's, bilateral: Secondary | ICD-10-CM | POA: Diagnosis not present

## 2021-07-29 DIAGNOSIS — Z961 Presence of intraocular lens: Secondary | ICD-10-CM | POA: Diagnosis not present

## 2021-07-29 DIAGNOSIS — D3132 Benign neoplasm of left choroid: Secondary | ICD-10-CM | POA: Diagnosis not present

## 2021-07-29 DIAGNOSIS — G510 Bell's palsy: Secondary | ICD-10-CM | POA: Diagnosis not present

## 2021-07-29 DIAGNOSIS — H401112 Primary open-angle glaucoma, right eye, moderate stage: Secondary | ICD-10-CM | POA: Diagnosis not present

## 2021-08-01 DIAGNOSIS — B259 Cytomegaloviral disease, unspecified: Secondary | ICD-10-CM | POA: Diagnosis not present

## 2021-08-02 DIAGNOSIS — B259 Cytomegaloviral disease, unspecified: Secondary | ICD-10-CM | POA: Diagnosis not present

## 2021-08-03 DIAGNOSIS — Z940 Kidney transplant status: Secondary | ICD-10-CM | POA: Diagnosis not present

## 2021-08-03 DIAGNOSIS — Z5181 Encounter for therapeutic drug level monitoring: Secondary | ICD-10-CM | POA: Diagnosis not present

## 2021-08-03 DIAGNOSIS — D849 Immunodeficiency, unspecified: Secondary | ICD-10-CM | POA: Diagnosis not present

## 2021-08-04 DIAGNOSIS — G8929 Other chronic pain: Secondary | ICD-10-CM | POA: Diagnosis not present

## 2021-08-04 DIAGNOSIS — D849 Immunodeficiency, unspecified: Secondary | ICD-10-CM | POA: Diagnosis not present

## 2021-08-04 DIAGNOSIS — Z79621 Long term (current) use of calcineurin inhibitor: Secondary | ICD-10-CM | POA: Diagnosis not present

## 2021-08-04 DIAGNOSIS — Z7982 Long term (current) use of aspirin: Secondary | ICD-10-CM | POA: Diagnosis not present

## 2021-08-04 DIAGNOSIS — M310 Hypersensitivity angiitis: Secondary | ICD-10-CM | POA: Diagnosis not present

## 2021-08-04 DIAGNOSIS — M5430 Sciatica, unspecified side: Secondary | ICD-10-CM | POA: Diagnosis not present

## 2021-08-04 DIAGNOSIS — Z87891 Personal history of nicotine dependence: Secondary | ICD-10-CM | POA: Diagnosis not present

## 2021-08-04 DIAGNOSIS — I129 Hypertensive chronic kidney disease with stage 1 through stage 4 chronic kidney disease, or unspecified chronic kidney disease: Secondary | ICD-10-CM | POA: Diagnosis not present

## 2021-08-04 DIAGNOSIS — B259 Cytomegaloviral disease, unspecified: Secondary | ICD-10-CM | POA: Diagnosis not present

## 2021-08-04 DIAGNOSIS — Z452 Encounter for adjustment and management of vascular access device: Secondary | ICD-10-CM | POA: Diagnosis not present

## 2021-08-04 DIAGNOSIS — Z8711 Personal history of peptic ulcer disease: Secondary | ICD-10-CM | POA: Diagnosis not present

## 2021-08-04 DIAGNOSIS — N189 Chronic kidney disease, unspecified: Secondary | ICD-10-CM | POA: Diagnosis not present

## 2021-08-04 DIAGNOSIS — Z940 Kidney transplant status: Secondary | ICD-10-CM | POA: Diagnosis not present

## 2021-08-04 DIAGNOSIS — D709 Neutropenia, unspecified: Secondary | ICD-10-CM | POA: Diagnosis not present

## 2021-08-04 DIAGNOSIS — Z7952 Long term (current) use of systemic steroids: Secondary | ICD-10-CM | POA: Diagnosis not present

## 2021-08-04 DIAGNOSIS — K219 Gastro-esophageal reflux disease without esophagitis: Secondary | ICD-10-CM | POA: Diagnosis not present

## 2021-08-04 DIAGNOSIS — K5909 Other constipation: Secondary | ICD-10-CM | POA: Diagnosis not present

## 2021-08-04 DIAGNOSIS — E039 Hypothyroidism, unspecified: Secondary | ICD-10-CM | POA: Diagnosis not present

## 2021-08-04 DIAGNOSIS — Z87442 Personal history of urinary calculi: Secondary | ICD-10-CM | POA: Diagnosis not present

## 2021-08-04 DIAGNOSIS — E785 Hyperlipidemia, unspecified: Secondary | ICD-10-CM | POA: Diagnosis not present

## 2021-08-06 DIAGNOSIS — B259 Cytomegaloviral disease, unspecified: Secondary | ICD-10-CM | POA: Diagnosis not present

## 2021-08-08 DIAGNOSIS — B259 Cytomegaloviral disease, unspecified: Secondary | ICD-10-CM | POA: Diagnosis not present

## 2021-08-10 DIAGNOSIS — K5909 Other constipation: Secondary | ICD-10-CM | POA: Diagnosis not present

## 2021-08-10 DIAGNOSIS — Z7952 Long term (current) use of systemic steroids: Secondary | ICD-10-CM | POA: Diagnosis not present

## 2021-08-10 DIAGNOSIS — Z7982 Long term (current) use of aspirin: Secondary | ICD-10-CM | POA: Diagnosis not present

## 2021-08-10 DIAGNOSIS — G8929 Other chronic pain: Secondary | ICD-10-CM | POA: Diagnosis not present

## 2021-08-10 DIAGNOSIS — E039 Hypothyroidism, unspecified: Secondary | ICD-10-CM | POA: Diagnosis not present

## 2021-08-10 DIAGNOSIS — E785 Hyperlipidemia, unspecified: Secondary | ICD-10-CM | POA: Diagnosis not present

## 2021-08-10 DIAGNOSIS — M310 Hypersensitivity angiitis: Secondary | ICD-10-CM | POA: Diagnosis not present

## 2021-08-10 DIAGNOSIS — M5430 Sciatica, unspecified side: Secondary | ICD-10-CM | POA: Diagnosis not present

## 2021-08-10 DIAGNOSIS — Z940 Kidney transplant status: Secondary | ICD-10-CM | POA: Diagnosis not present

## 2021-08-10 DIAGNOSIS — Z79621 Long term (current) use of calcineurin inhibitor: Secondary | ICD-10-CM | POA: Diagnosis not present

## 2021-08-10 DIAGNOSIS — Z8711 Personal history of peptic ulcer disease: Secondary | ICD-10-CM | POA: Diagnosis not present

## 2021-08-10 DIAGNOSIS — B259 Cytomegaloviral disease, unspecified: Secondary | ICD-10-CM | POA: Diagnosis not present

## 2021-08-10 DIAGNOSIS — I129 Hypertensive chronic kidney disease with stage 1 through stage 4 chronic kidney disease, or unspecified chronic kidney disease: Secondary | ICD-10-CM | POA: Diagnosis not present

## 2021-08-10 DIAGNOSIS — Z87891 Personal history of nicotine dependence: Secondary | ICD-10-CM | POA: Diagnosis not present

## 2021-08-10 DIAGNOSIS — Z452 Encounter for adjustment and management of vascular access device: Secondary | ICD-10-CM | POA: Diagnosis not present

## 2021-08-10 DIAGNOSIS — N189 Chronic kidney disease, unspecified: Secondary | ICD-10-CM | POA: Diagnosis not present

## 2021-08-10 DIAGNOSIS — D849 Immunodeficiency, unspecified: Secondary | ICD-10-CM | POA: Diagnosis not present

## 2021-08-10 DIAGNOSIS — D709 Neutropenia, unspecified: Secondary | ICD-10-CM | POA: Diagnosis not present

## 2021-08-10 DIAGNOSIS — Z87442 Personal history of urinary calculi: Secondary | ICD-10-CM | POA: Diagnosis not present

## 2021-08-10 DIAGNOSIS — K219 Gastro-esophageal reflux disease without esophagitis: Secondary | ICD-10-CM | POA: Diagnosis not present

## 2021-08-11 DIAGNOSIS — Z79621 Long term (current) use of calcineurin inhibitor: Secondary | ICD-10-CM | POA: Diagnosis not present

## 2021-08-11 DIAGNOSIS — B259 Cytomegaloviral disease, unspecified: Secondary | ICD-10-CM | POA: Diagnosis not present

## 2021-08-11 DIAGNOSIS — T8619 Other complication of kidney transplant: Secondary | ICD-10-CM | POA: Diagnosis not present

## 2021-08-11 DIAGNOSIS — I10 Essential (primary) hypertension: Secondary | ICD-10-CM | POA: Diagnosis not present

## 2021-08-11 DIAGNOSIS — Z87891 Personal history of nicotine dependence: Secondary | ICD-10-CM | POA: Diagnosis not present

## 2021-08-11 DIAGNOSIS — Y830 Surgical operation with transplant of whole organ as the cause of abnormal reaction of the patient, or of later complication, without mention of misadventure at the time of the procedure: Secondary | ICD-10-CM | POA: Diagnosis not present

## 2021-08-11 DIAGNOSIS — Z7952 Long term (current) use of systemic steroids: Secondary | ICD-10-CM | POA: Diagnosis not present

## 2021-08-11 DIAGNOSIS — Z79899 Other long term (current) drug therapy: Secondary | ICD-10-CM | POA: Diagnosis not present

## 2021-08-11 DIAGNOSIS — K219 Gastro-esophageal reflux disease without esophagitis: Secondary | ICD-10-CM | POA: Diagnosis not present

## 2021-08-11 DIAGNOSIS — D84821 Immunodeficiency due to drugs: Secondary | ICD-10-CM | POA: Diagnosis not present

## 2021-08-11 DIAGNOSIS — A0472 Enterocolitis due to Clostridium difficile, not specified as recurrent: Secondary | ICD-10-CM | POA: Diagnosis not present

## 2021-08-11 DIAGNOSIS — D849 Immunodeficiency, unspecified: Secondary | ICD-10-CM | POA: Diagnosis not present

## 2021-08-11 DIAGNOSIS — Z940 Kidney transplant status: Secondary | ICD-10-CM | POA: Diagnosis not present

## 2021-08-11 DIAGNOSIS — D638 Anemia in other chronic diseases classified elsewhere: Secondary | ICD-10-CM | POA: Diagnosis not present

## 2021-08-15 DIAGNOSIS — B259 Cytomegaloviral disease, unspecified: Secondary | ICD-10-CM | POA: Diagnosis not present

## 2021-08-16 DIAGNOSIS — B259 Cytomegaloviral disease, unspecified: Secondary | ICD-10-CM | POA: Diagnosis not present

## 2021-08-17 DIAGNOSIS — Z940 Kidney transplant status: Secondary | ICD-10-CM | POA: Diagnosis not present

## 2021-08-17 DIAGNOSIS — D849 Immunodeficiency, unspecified: Secondary | ICD-10-CM | POA: Diagnosis not present

## 2021-08-17 DIAGNOSIS — Z5181 Encounter for therapeutic drug level monitoring: Secondary | ICD-10-CM | POA: Diagnosis not present

## 2021-08-18 DIAGNOSIS — M310 Hypersensitivity angiitis: Secondary | ICD-10-CM | POA: Diagnosis not present

## 2021-08-18 DIAGNOSIS — Z452 Encounter for adjustment and management of vascular access device: Secondary | ICD-10-CM | POA: Diagnosis not present

## 2021-08-18 DIAGNOSIS — Z940 Kidney transplant status: Secondary | ICD-10-CM | POA: Diagnosis not present

## 2021-08-18 DIAGNOSIS — K5909 Other constipation: Secondary | ICD-10-CM | POA: Diagnosis not present

## 2021-08-18 DIAGNOSIS — Z7952 Long term (current) use of systemic steroids: Secondary | ICD-10-CM | POA: Diagnosis not present

## 2021-08-18 DIAGNOSIS — I129 Hypertensive chronic kidney disease with stage 1 through stage 4 chronic kidney disease, or unspecified chronic kidney disease: Secondary | ICD-10-CM | POA: Diagnosis not present

## 2021-08-18 DIAGNOSIS — M5430 Sciatica, unspecified side: Secondary | ICD-10-CM | POA: Diagnosis not present

## 2021-08-18 DIAGNOSIS — Z7982 Long term (current) use of aspirin: Secondary | ICD-10-CM | POA: Diagnosis not present

## 2021-08-18 DIAGNOSIS — B259 Cytomegaloviral disease, unspecified: Secondary | ICD-10-CM | POA: Diagnosis not present

## 2021-08-18 DIAGNOSIS — N189 Chronic kidney disease, unspecified: Secondary | ICD-10-CM | POA: Diagnosis not present

## 2021-08-18 DIAGNOSIS — D849 Immunodeficiency, unspecified: Secondary | ICD-10-CM | POA: Diagnosis not present

## 2021-08-18 DIAGNOSIS — D709 Neutropenia, unspecified: Secondary | ICD-10-CM | POA: Diagnosis not present

## 2021-08-18 DIAGNOSIS — G8929 Other chronic pain: Secondary | ICD-10-CM | POA: Diagnosis not present

## 2021-08-18 DIAGNOSIS — Z87442 Personal history of urinary calculi: Secondary | ICD-10-CM | POA: Diagnosis not present

## 2021-08-18 DIAGNOSIS — Z87891 Personal history of nicotine dependence: Secondary | ICD-10-CM | POA: Diagnosis not present

## 2021-08-18 DIAGNOSIS — E785 Hyperlipidemia, unspecified: Secondary | ICD-10-CM | POA: Diagnosis not present

## 2021-08-18 DIAGNOSIS — E039 Hypothyroidism, unspecified: Secondary | ICD-10-CM | POA: Diagnosis not present

## 2021-08-18 DIAGNOSIS — K219 Gastro-esophageal reflux disease without esophagitis: Secondary | ICD-10-CM | POA: Diagnosis not present

## 2021-08-18 DIAGNOSIS — Z8711 Personal history of peptic ulcer disease: Secondary | ICD-10-CM | POA: Diagnosis not present

## 2021-08-18 DIAGNOSIS — Z79621 Long term (current) use of calcineurin inhibitor: Secondary | ICD-10-CM | POA: Diagnosis not present

## 2021-08-20 DIAGNOSIS — B259 Cytomegaloviral disease, unspecified: Secondary | ICD-10-CM | POA: Diagnosis not present

## 2021-08-22 DIAGNOSIS — B259 Cytomegaloviral disease, unspecified: Secondary | ICD-10-CM | POA: Diagnosis not present

## 2021-08-23 DIAGNOSIS — B259 Cytomegaloviral disease, unspecified: Secondary | ICD-10-CM | POA: Diagnosis not present

## 2021-08-24 DIAGNOSIS — M310 Hypersensitivity angiitis: Secondary | ICD-10-CM | POA: Diagnosis not present

## 2021-08-24 DIAGNOSIS — Z7982 Long term (current) use of aspirin: Secondary | ICD-10-CM | POA: Diagnosis not present

## 2021-08-24 DIAGNOSIS — I129 Hypertensive chronic kidney disease with stage 1 through stage 4 chronic kidney disease, or unspecified chronic kidney disease: Secondary | ICD-10-CM | POA: Diagnosis not present

## 2021-08-24 DIAGNOSIS — D709 Neutropenia, unspecified: Secondary | ICD-10-CM | POA: Diagnosis not present

## 2021-08-24 DIAGNOSIS — Z7952 Long term (current) use of systemic steroids: Secondary | ICD-10-CM | POA: Diagnosis not present

## 2021-08-24 DIAGNOSIS — Z87891 Personal history of nicotine dependence: Secondary | ICD-10-CM | POA: Diagnosis not present

## 2021-08-24 DIAGNOSIS — N189 Chronic kidney disease, unspecified: Secondary | ICD-10-CM | POA: Diagnosis not present

## 2021-08-24 DIAGNOSIS — Z79621 Long term (current) use of calcineurin inhibitor: Secondary | ICD-10-CM | POA: Diagnosis not present

## 2021-08-24 DIAGNOSIS — E785 Hyperlipidemia, unspecified: Secondary | ICD-10-CM | POA: Diagnosis not present

## 2021-08-24 DIAGNOSIS — E039 Hypothyroidism, unspecified: Secondary | ICD-10-CM | POA: Diagnosis not present

## 2021-08-24 DIAGNOSIS — K219 Gastro-esophageal reflux disease without esophagitis: Secondary | ICD-10-CM | POA: Diagnosis not present

## 2021-08-24 DIAGNOSIS — D849 Immunodeficiency, unspecified: Secondary | ICD-10-CM | POA: Diagnosis not present

## 2021-08-24 DIAGNOSIS — Z940 Kidney transplant status: Secondary | ICD-10-CM | POA: Diagnosis not present

## 2021-08-24 DIAGNOSIS — Z452 Encounter for adjustment and management of vascular access device: Secondary | ICD-10-CM | POA: Diagnosis not present

## 2021-08-24 DIAGNOSIS — Z8711 Personal history of peptic ulcer disease: Secondary | ICD-10-CM | POA: Diagnosis not present

## 2021-08-24 DIAGNOSIS — G8929 Other chronic pain: Secondary | ICD-10-CM | POA: Diagnosis not present

## 2021-08-24 DIAGNOSIS — B259 Cytomegaloviral disease, unspecified: Secondary | ICD-10-CM | POA: Diagnosis not present

## 2021-08-24 DIAGNOSIS — M5430 Sciatica, unspecified side: Secondary | ICD-10-CM | POA: Diagnosis not present

## 2021-08-24 DIAGNOSIS — Z87442 Personal history of urinary calculi: Secondary | ICD-10-CM | POA: Diagnosis not present

## 2021-08-24 DIAGNOSIS — K5909 Other constipation: Secondary | ICD-10-CM | POA: Diagnosis not present

## 2021-08-25 DIAGNOSIS — D849 Immunodeficiency, unspecified: Secondary | ICD-10-CM | POA: Diagnosis not present

## 2021-08-25 DIAGNOSIS — B259 Cytomegaloviral disease, unspecified: Secondary | ICD-10-CM | POA: Diagnosis not present

## 2021-08-25 DIAGNOSIS — Z940 Kidney transplant status: Secondary | ICD-10-CM | POA: Diagnosis not present

## 2021-08-27 DIAGNOSIS — B259 Cytomegaloviral disease, unspecified: Secondary | ICD-10-CM | POA: Diagnosis not present

## 2021-08-27 DIAGNOSIS — I129 Hypertensive chronic kidney disease with stage 1 through stage 4 chronic kidney disease, or unspecified chronic kidney disease: Secondary | ICD-10-CM | POA: Diagnosis not present

## 2021-08-27 DIAGNOSIS — D849 Immunodeficiency, unspecified: Secondary | ICD-10-CM | POA: Diagnosis not present

## 2021-08-27 DIAGNOSIS — N189 Chronic kidney disease, unspecified: Secondary | ICD-10-CM | POA: Diagnosis not present

## 2021-08-27 DIAGNOSIS — Z7982 Long term (current) use of aspirin: Secondary | ICD-10-CM | POA: Diagnosis not present

## 2021-08-27 DIAGNOSIS — Z79621 Long term (current) use of calcineurin inhibitor: Secondary | ICD-10-CM | POA: Diagnosis not present

## 2021-08-27 DIAGNOSIS — Z8711 Personal history of peptic ulcer disease: Secondary | ICD-10-CM | POA: Diagnosis not present

## 2021-08-27 DIAGNOSIS — Z940 Kidney transplant status: Secondary | ICD-10-CM | POA: Diagnosis not present

## 2021-08-27 DIAGNOSIS — G8929 Other chronic pain: Secondary | ICD-10-CM | POA: Diagnosis not present

## 2021-08-27 DIAGNOSIS — K219 Gastro-esophageal reflux disease without esophagitis: Secondary | ICD-10-CM | POA: Diagnosis not present

## 2021-08-27 DIAGNOSIS — E785 Hyperlipidemia, unspecified: Secondary | ICD-10-CM | POA: Diagnosis not present

## 2021-08-27 DIAGNOSIS — E039 Hypothyroidism, unspecified: Secondary | ICD-10-CM | POA: Diagnosis not present

## 2021-08-27 DIAGNOSIS — Z87442 Personal history of urinary calculi: Secondary | ICD-10-CM | POA: Diagnosis not present

## 2021-08-27 DIAGNOSIS — M310 Hypersensitivity angiitis: Secondary | ICD-10-CM | POA: Diagnosis not present

## 2021-08-27 DIAGNOSIS — K5909 Other constipation: Secondary | ICD-10-CM | POA: Diagnosis not present

## 2021-08-27 DIAGNOSIS — M5430 Sciatica, unspecified side: Secondary | ICD-10-CM | POA: Diagnosis not present

## 2021-08-27 DIAGNOSIS — Z7952 Long term (current) use of systemic steroids: Secondary | ICD-10-CM | POA: Diagnosis not present

## 2021-08-27 DIAGNOSIS — Z87891 Personal history of nicotine dependence: Secondary | ICD-10-CM | POA: Diagnosis not present

## 2021-08-27 DIAGNOSIS — D709 Neutropenia, unspecified: Secondary | ICD-10-CM | POA: Diagnosis not present

## 2021-08-27 DIAGNOSIS — Z452 Encounter for adjustment and management of vascular access device: Secondary | ICD-10-CM | POA: Diagnosis not present

## 2021-08-31 DIAGNOSIS — D849 Immunodeficiency, unspecified: Secondary | ICD-10-CM | POA: Diagnosis not present

## 2021-08-31 DIAGNOSIS — Z940 Kidney transplant status: Secondary | ICD-10-CM | POA: Diagnosis not present

## 2021-08-31 DIAGNOSIS — Z5181 Encounter for therapeutic drug level monitoring: Secondary | ICD-10-CM | POA: Diagnosis not present

## 2021-09-14 DIAGNOSIS — Z940 Kidney transplant status: Secondary | ICD-10-CM | POA: Diagnosis not present

## 2021-09-14 DIAGNOSIS — Z5181 Encounter for therapeutic drug level monitoring: Secondary | ICD-10-CM | POA: Diagnosis not present

## 2021-09-14 DIAGNOSIS — D849 Immunodeficiency, unspecified: Secondary | ICD-10-CM | POA: Diagnosis not present

## 2021-09-15 ENCOUNTER — Telehealth: Payer: Self-pay

## 2021-09-15 NOTE — Patient Outreach (Signed)
  Care Coordination   09/15/2021 Name: Cristina Wilson MRN: [id number] DOB: 1950/02/11   Care Coordination Outreach Attempts:  An unsuccessful telephone outreach was attempted today to offer the patient information about available care coordination services as a benefit of their health plan.   Follow Up Plan:  Additional outreach attempts will be made to offer the patient care coordination information and services.   Encounter Outcome:  No Answer  Care Coordination Interventions Activated:  No   Care Coordination Interventions:  No, not indicated    [place] [facility] [phone]

## 2021-09-16 DIAGNOSIS — R1013 Epigastric pain: Secondary | ICD-10-CM | POA: Diagnosis not present

## 2021-09-17 DIAGNOSIS — R234 Changes in skin texture: Secondary | ICD-10-CM | POA: Diagnosis not present

## 2021-09-17 DIAGNOSIS — L720 Epidermal cyst: Secondary | ICD-10-CM | POA: Diagnosis not present

## 2021-09-18 DIAGNOSIS — K2950 Unspecified chronic gastritis without bleeding: Secondary | ICD-10-CM | POA: Diagnosis not present

## 2021-09-18 DIAGNOSIS — K2100 Gastro-esophageal reflux disease with esophagitis, without bleeding: Secondary | ICD-10-CM | POA: Diagnosis not present

## 2021-09-18 DIAGNOSIS — K449 Diaphragmatic hernia without obstruction or gangrene: Secondary | ICD-10-CM | POA: Diagnosis not present

## 2021-09-18 DIAGNOSIS — K319 Disease of stomach and duodenum, unspecified: Secondary | ICD-10-CM | POA: Diagnosis not present

## 2021-09-18 DIAGNOSIS — R1013 Epigastric pain: Secondary | ICD-10-CM | POA: Diagnosis not present

## 2021-09-21 DIAGNOSIS — Z5181 Encounter for therapeutic drug level monitoring: Secondary | ICD-10-CM | POA: Diagnosis not present

## 2021-09-21 DIAGNOSIS — D849 Immunodeficiency, unspecified: Secondary | ICD-10-CM | POA: Diagnosis not present

## 2021-09-21 DIAGNOSIS — B259 Cytomegaloviral disease, unspecified: Secondary | ICD-10-CM | POA: Diagnosis not present

## 2021-09-21 DIAGNOSIS — Z940 Kidney transplant status: Secondary | ICD-10-CM | POA: Diagnosis not present

## 2021-09-22 DIAGNOSIS — K319 Disease of stomach and duodenum, unspecified: Secondary | ICD-10-CM | POA: Diagnosis not present

## 2021-09-28 DIAGNOSIS — D849 Immunodeficiency, unspecified: Secondary | ICD-10-CM | POA: Diagnosis not present

## 2021-09-28 DIAGNOSIS — Z5181 Encounter for therapeutic drug level monitoring: Secondary | ICD-10-CM | POA: Diagnosis not present

## 2021-09-28 DIAGNOSIS — Z940 Kidney transplant status: Secondary | ICD-10-CM | POA: Diagnosis not present

## 2021-09-30 DIAGNOSIS — M9903 Segmental and somatic dysfunction of lumbar region: Secondary | ICD-10-CM | POA: Diagnosis not present

## 2021-09-30 DIAGNOSIS — M9901 Segmental and somatic dysfunction of cervical region: Secondary | ICD-10-CM | POA: Diagnosis not present

## 2021-09-30 DIAGNOSIS — M5441 Lumbago with sciatica, right side: Secondary | ICD-10-CM | POA: Diagnosis not present

## 2021-09-30 DIAGNOSIS — M9902 Segmental and somatic dysfunction of thoracic region: Secondary | ICD-10-CM | POA: Diagnosis not present

## 2021-10-01 ENCOUNTER — Other Ambulatory Visit: Payer: Self-pay | Admitting: Neurosurgery

## 2021-10-01 DIAGNOSIS — I671 Cerebral aneurysm, nonruptured: Secondary | ICD-10-CM

## 2021-10-02 DIAGNOSIS — M9903 Segmental and somatic dysfunction of lumbar region: Secondary | ICD-10-CM | POA: Diagnosis not present

## 2021-10-02 DIAGNOSIS — M9901 Segmental and somatic dysfunction of cervical region: Secondary | ICD-10-CM | POA: Diagnosis not present

## 2021-10-02 DIAGNOSIS — M9902 Segmental and somatic dysfunction of thoracic region: Secondary | ICD-10-CM | POA: Diagnosis not present

## 2021-10-02 DIAGNOSIS — M5441 Lumbago with sciatica, right side: Secondary | ICD-10-CM | POA: Diagnosis not present

## 2021-10-05 DIAGNOSIS — Z5181 Encounter for therapeutic drug level monitoring: Secondary | ICD-10-CM | POA: Diagnosis not present

## 2021-10-05 DIAGNOSIS — D849 Immunodeficiency, unspecified: Secondary | ICD-10-CM | POA: Diagnosis not present

## 2021-10-05 DIAGNOSIS — Z940 Kidney transplant status: Secondary | ICD-10-CM | POA: Diagnosis not present

## 2021-10-08 DIAGNOSIS — M9903 Segmental and somatic dysfunction of lumbar region: Secondary | ICD-10-CM | POA: Diagnosis not present

## 2021-10-08 DIAGNOSIS — M9902 Segmental and somatic dysfunction of thoracic region: Secondary | ICD-10-CM | POA: Diagnosis not present

## 2021-10-08 DIAGNOSIS — M9905 Segmental and somatic dysfunction of pelvic region: Secondary | ICD-10-CM | POA: Diagnosis not present

## 2021-10-08 DIAGNOSIS — M9901 Segmental and somatic dysfunction of cervical region: Secondary | ICD-10-CM | POA: Diagnosis not present

## 2021-10-09 DIAGNOSIS — M9901 Segmental and somatic dysfunction of cervical region: Secondary | ICD-10-CM | POA: Diagnosis not present

## 2021-10-09 DIAGNOSIS — E039 Hypothyroidism, unspecified: Secondary | ICD-10-CM | POA: Diagnosis not present

## 2021-10-09 DIAGNOSIS — M9905 Segmental and somatic dysfunction of pelvic region: Secondary | ICD-10-CM | POA: Diagnosis not present

## 2021-10-09 DIAGNOSIS — Z1639 Resistance to other specified antimicrobial drug: Secondary | ICD-10-CM | POA: Diagnosis not present

## 2021-10-09 DIAGNOSIS — T8613 Kidney transplant infection: Secondary | ICD-10-CM | POA: Diagnosis not present

## 2021-10-09 DIAGNOSIS — Z8679 Personal history of other diseases of the circulatory system: Secondary | ICD-10-CM | POA: Diagnosis not present

## 2021-10-09 DIAGNOSIS — M310 Hypersensitivity angiitis: Secondary | ICD-10-CM | POA: Diagnosis not present

## 2021-10-09 DIAGNOSIS — Z7952 Long term (current) use of systemic steroids: Secondary | ICD-10-CM | POA: Diagnosis not present

## 2021-10-09 DIAGNOSIS — Z881 Allergy status to other antibiotic agents status: Secondary | ICD-10-CM | POA: Diagnosis not present

## 2021-10-09 DIAGNOSIS — D849 Immunodeficiency, unspecified: Secondary | ICD-10-CM | POA: Diagnosis not present

## 2021-10-09 DIAGNOSIS — Z79899 Other long term (current) drug therapy: Secondary | ICD-10-CM | POA: Diagnosis not present

## 2021-10-09 DIAGNOSIS — D702 Other drug-induced agranulocytosis: Secondary | ICD-10-CM | POA: Diagnosis not present

## 2021-10-09 DIAGNOSIS — Z792 Long term (current) use of antibiotics: Secondary | ICD-10-CM | POA: Diagnosis not present

## 2021-10-09 DIAGNOSIS — D3132 Benign neoplasm of left choroid: Secondary | ICD-10-CM | POA: Diagnosis not present

## 2021-10-09 DIAGNOSIS — Z7982 Long term (current) use of aspirin: Secondary | ICD-10-CM | POA: Diagnosis not present

## 2021-10-09 DIAGNOSIS — K5900 Constipation, unspecified: Secondary | ICD-10-CM | POA: Diagnosis not present

## 2021-10-09 DIAGNOSIS — T8619 Other complication of kidney transplant: Secondary | ICD-10-CM | POA: Diagnosis not present

## 2021-10-09 DIAGNOSIS — D709 Neutropenia, unspecified: Secondary | ICD-10-CM | POA: Diagnosis not present

## 2021-10-09 DIAGNOSIS — M9903 Segmental and somatic dysfunction of lumbar region: Secondary | ICD-10-CM | POA: Diagnosis not present

## 2021-10-09 DIAGNOSIS — Z8619 Personal history of other infectious and parasitic diseases: Secondary | ICD-10-CM | POA: Diagnosis not present

## 2021-10-09 DIAGNOSIS — N179 Acute kidney failure, unspecified: Secondary | ICD-10-CM | POA: Diagnosis not present

## 2021-10-09 DIAGNOSIS — Z9049 Acquired absence of other specified parts of digestive tract: Secondary | ICD-10-CM | POA: Diagnosis not present

## 2021-10-09 DIAGNOSIS — Z888 Allergy status to other drugs, medicaments and biological substances status: Secondary | ICD-10-CM | POA: Diagnosis not present

## 2021-10-09 DIAGNOSIS — I10 Essential (primary) hypertension: Secondary | ICD-10-CM | POA: Diagnosis not present

## 2021-10-09 DIAGNOSIS — K219 Gastro-esophageal reflux disease without esophagitis: Secondary | ICD-10-CM | POA: Diagnosis not present

## 2021-10-09 DIAGNOSIS — D3131 Benign neoplasm of right choroid: Secondary | ICD-10-CM | POA: Diagnosis not present

## 2021-10-09 DIAGNOSIS — Z1633 Resistance to antiviral drug(s): Secondary | ICD-10-CM | POA: Diagnosis not present

## 2021-10-09 DIAGNOSIS — B259 Cytomegaloviral disease, unspecified: Secondary | ICD-10-CM | POA: Diagnosis not present

## 2021-10-09 DIAGNOSIS — Z79621 Long term (current) use of calcineurin inhibitor: Secondary | ICD-10-CM | POA: Diagnosis not present

## 2021-10-09 DIAGNOSIS — Z8719 Personal history of other diseases of the digestive system: Secondary | ICD-10-CM | POA: Diagnosis not present

## 2021-10-09 DIAGNOSIS — Z940 Kidney transplant status: Secondary | ICD-10-CM | POA: Diagnosis not present

## 2021-10-09 DIAGNOSIS — M9902 Segmental and somatic dysfunction of thoracic region: Secondary | ICD-10-CM | POA: Diagnosis not present

## 2021-10-09 DIAGNOSIS — H04123 Dry eye syndrome of bilateral lacrimal glands: Secondary | ICD-10-CM | POA: Diagnosis not present

## 2021-10-09 DIAGNOSIS — D84821 Immunodeficiency due to drugs: Secondary | ICD-10-CM | POA: Diagnosis not present

## 2021-10-09 DIAGNOSIS — B348 Other viral infections of unspecified site: Secondary | ICD-10-CM | POA: Diagnosis not present

## 2021-10-09 DIAGNOSIS — Z882 Allergy status to sulfonamides status: Secondary | ICD-10-CM | POA: Diagnosis not present

## 2021-10-19 DIAGNOSIS — Z5181 Encounter for therapeutic drug level monitoring: Secondary | ICD-10-CM | POA: Diagnosis not present

## 2021-10-19 DIAGNOSIS — D849 Immunodeficiency, unspecified: Secondary | ICD-10-CM | POA: Diagnosis not present

## 2021-10-19 DIAGNOSIS — Z940 Kidney transplant status: Secondary | ICD-10-CM | POA: Diagnosis not present

## 2021-10-20 DIAGNOSIS — M9901 Segmental and somatic dysfunction of cervical region: Secondary | ICD-10-CM | POA: Diagnosis not present

## 2021-10-20 DIAGNOSIS — M9903 Segmental and somatic dysfunction of lumbar region: Secondary | ICD-10-CM | POA: Diagnosis not present

## 2021-10-20 DIAGNOSIS — M9902 Segmental and somatic dysfunction of thoracic region: Secondary | ICD-10-CM | POA: Diagnosis not present

## 2021-10-20 DIAGNOSIS — M9905 Segmental and somatic dysfunction of pelvic region: Secondary | ICD-10-CM | POA: Diagnosis not present

## 2021-10-21 ENCOUNTER — Telehealth: Payer: Self-pay

## 2021-10-21 DIAGNOSIS — M9905 Segmental and somatic dysfunction of pelvic region: Secondary | ICD-10-CM | POA: Diagnosis not present

## 2021-10-21 DIAGNOSIS — M9902 Segmental and somatic dysfunction of thoracic region: Secondary | ICD-10-CM | POA: Diagnosis not present

## 2021-10-21 DIAGNOSIS — M9903 Segmental and somatic dysfunction of lumbar region: Secondary | ICD-10-CM | POA: Diagnosis not present

## 2021-10-21 DIAGNOSIS — M9901 Segmental and somatic dysfunction of cervical region: Secondary | ICD-10-CM | POA: Diagnosis not present

## 2021-10-21 NOTE — Patient Outreach (Signed)
  Care Coordination   10/21/2021 Name: Cristina Wilson MRN: [id number] DOB: 08-Nov-1950   Care Coordination Outreach Attempts:  A second unsuccessful outreach was attempted today to offer the patient with information about available care coordination services as a benefit of their health plan.     Follow Up Plan:  Additional outreach attempts will be made to offer the patient care coordination information and services.   Encounter Outcome:  No Answer  Care Coordination Interventions Activated:  No   Care Coordination Interventions:  No, not indicated     [place] [facility] [phone]

## 2021-10-22 DIAGNOSIS — R69 Illness, unspecified: Secondary | ICD-10-CM | POA: Diagnosis not present

## 2021-10-22 DIAGNOSIS — E039 Hypothyroidism, unspecified: Secondary | ICD-10-CM | POA: Diagnosis not present

## 2021-10-22 DIAGNOSIS — F325 Major depressive disorder, single episode, in full remission: Secondary | ICD-10-CM | POA: Diagnosis not present

## 2021-10-22 DIAGNOSIS — M810 Age-related osteoporosis without current pathological fracture: Secondary | ICD-10-CM | POA: Diagnosis not present

## 2021-10-22 DIAGNOSIS — E7800 Pure hypercholesterolemia, unspecified: Secondary | ICD-10-CM | POA: Diagnosis not present

## 2021-10-22 DIAGNOSIS — I129 Hypertensive chronic kidney disease with stage 1 through stage 4 chronic kidney disease, or unspecified chronic kidney disease: Secondary | ICD-10-CM | POA: Diagnosis not present

## 2021-10-23 ENCOUNTER — Ambulatory Visit
Admission: RE | Admit: 2021-10-23 | Discharge: 2021-10-23 | Disposition: A | Discharge status: Home / Self Care | Payer: Medicare HMO | Source: Ambulatory Visit | Attending: Neurosurgery | Admitting: Neurosurgery

## 2021-10-23 DIAGNOSIS — M9905 Segmental and somatic dysfunction of pelvic region: Secondary | ICD-10-CM | POA: Diagnosis not present

## 2021-10-23 DIAGNOSIS — I6602 Occlusion and stenosis of left middle cerebral artery: Secondary | ICD-10-CM | POA: Diagnosis not present

## 2021-10-23 DIAGNOSIS — M9902 Segmental and somatic dysfunction of thoracic region: Secondary | ICD-10-CM | POA: Diagnosis not present

## 2021-10-23 DIAGNOSIS — M9903 Segmental and somatic dysfunction of lumbar region: Secondary | ICD-10-CM | POA: Diagnosis not present

## 2021-10-23 DIAGNOSIS — Q283 Other malformations of cerebral vessels: Secondary | ICD-10-CM | POA: Diagnosis not present

## 2021-10-23 DIAGNOSIS — M9901 Segmental and somatic dysfunction of cervical region: Secondary | ICD-10-CM | POA: Diagnosis not present

## 2021-10-23 DIAGNOSIS — I671 Cerebral aneurysm, nonruptured: Secondary | ICD-10-CM

## 2021-10-26 DIAGNOSIS — D849 Immunodeficiency, unspecified: Secondary | ICD-10-CM | POA: Diagnosis not present

## 2021-10-26 DIAGNOSIS — M9901 Segmental and somatic dysfunction of cervical region: Secondary | ICD-10-CM | POA: Diagnosis not present

## 2021-10-26 DIAGNOSIS — B259 Cytomegaloviral disease, unspecified: Secondary | ICD-10-CM | POA: Diagnosis not present

## 2021-10-26 DIAGNOSIS — M9905 Segmental and somatic dysfunction of pelvic region: Secondary | ICD-10-CM | POA: Diagnosis not present

## 2021-10-26 DIAGNOSIS — M9902 Segmental and somatic dysfunction of thoracic region: Secondary | ICD-10-CM | POA: Diagnosis not present

## 2021-10-26 DIAGNOSIS — D702 Other drug-induced agranulocytosis: Secondary | ICD-10-CM | POA: Diagnosis not present

## 2021-10-26 DIAGNOSIS — B348 Other viral infections of unspecified site: Secondary | ICD-10-CM | POA: Diagnosis not present

## 2021-10-26 DIAGNOSIS — Z940 Kidney transplant status: Secondary | ICD-10-CM | POA: Diagnosis not present

## 2021-10-26 DIAGNOSIS — M9903 Segmental and somatic dysfunction of lumbar region: Secondary | ICD-10-CM | POA: Diagnosis not present

## 2021-10-26 DIAGNOSIS — Z5181 Encounter for therapeutic drug level monitoring: Secondary | ICD-10-CM | POA: Diagnosis not present

## 2021-10-29 DIAGNOSIS — M9901 Segmental and somatic dysfunction of cervical region: Secondary | ICD-10-CM | POA: Diagnosis not present

## 2021-10-29 DIAGNOSIS — M9903 Segmental and somatic dysfunction of lumbar region: Secondary | ICD-10-CM | POA: Diagnosis not present

## 2021-10-29 DIAGNOSIS — M9902 Segmental and somatic dysfunction of thoracic region: Secondary | ICD-10-CM | POA: Diagnosis not present

## 2021-10-29 DIAGNOSIS — M9905 Segmental and somatic dysfunction of pelvic region: Secondary | ICD-10-CM | POA: Diagnosis not present

## 2021-10-30 DIAGNOSIS — M9902 Segmental and somatic dysfunction of thoracic region: Secondary | ICD-10-CM | POA: Diagnosis not present

## 2021-10-30 DIAGNOSIS — M9903 Segmental and somatic dysfunction of lumbar region: Secondary | ICD-10-CM | POA: Diagnosis not present

## 2021-10-30 DIAGNOSIS — M9901 Segmental and somatic dysfunction of cervical region: Secondary | ICD-10-CM | POA: Diagnosis not present

## 2021-10-30 DIAGNOSIS — M9905 Segmental and somatic dysfunction of pelvic region: Secondary | ICD-10-CM | POA: Diagnosis not present

## 2021-11-02 DIAGNOSIS — M9901 Segmental and somatic dysfunction of cervical region: Secondary | ICD-10-CM | POA: Diagnosis not present

## 2021-11-02 DIAGNOSIS — M9905 Segmental and somatic dysfunction of pelvic region: Secondary | ICD-10-CM | POA: Diagnosis not present

## 2021-11-02 DIAGNOSIS — Z940 Kidney transplant status: Secondary | ICD-10-CM | POA: Diagnosis not present

## 2021-11-02 DIAGNOSIS — D849 Immunodeficiency, unspecified: Secondary | ICD-10-CM | POA: Diagnosis not present

## 2021-11-02 DIAGNOSIS — Z5181 Encounter for therapeutic drug level monitoring: Secondary | ICD-10-CM | POA: Diagnosis not present

## 2021-11-02 DIAGNOSIS — M9902 Segmental and somatic dysfunction of thoracic region: Secondary | ICD-10-CM | POA: Diagnosis not present

## 2021-11-02 DIAGNOSIS — M9903 Segmental and somatic dysfunction of lumbar region: Secondary | ICD-10-CM | POA: Diagnosis not present

## 2021-11-05 DIAGNOSIS — M9902 Segmental and somatic dysfunction of thoracic region: Secondary | ICD-10-CM | POA: Diagnosis not present

## 2021-11-05 DIAGNOSIS — M9901 Segmental and somatic dysfunction of cervical region: Secondary | ICD-10-CM | POA: Diagnosis not present

## 2021-11-05 DIAGNOSIS — M9903 Segmental and somatic dysfunction of lumbar region: Secondary | ICD-10-CM | POA: Diagnosis not present

## 2021-11-05 DIAGNOSIS — M9905 Segmental and somatic dysfunction of pelvic region: Secondary | ICD-10-CM | POA: Diagnosis not present

## 2021-11-09 DIAGNOSIS — Z5181 Encounter for therapeutic drug level monitoring: Secondary | ICD-10-CM | POA: Diagnosis not present

## 2021-11-09 DIAGNOSIS — Z940 Kidney transplant status: Secondary | ICD-10-CM | POA: Diagnosis not present

## 2021-11-09 DIAGNOSIS — D849 Immunodeficiency, unspecified: Secondary | ICD-10-CM | POA: Diagnosis not present

## 2021-11-09 DIAGNOSIS — M9902 Segmental and somatic dysfunction of thoracic region: Secondary | ICD-10-CM | POA: Diagnosis not present

## 2021-11-09 DIAGNOSIS — M9901 Segmental and somatic dysfunction of cervical region: Secondary | ICD-10-CM | POA: Diagnosis not present

## 2021-11-09 DIAGNOSIS — M9903 Segmental and somatic dysfunction of lumbar region: Secondary | ICD-10-CM | POA: Diagnosis not present

## 2021-11-09 DIAGNOSIS — M9905 Segmental and somatic dysfunction of pelvic region: Secondary | ICD-10-CM | POA: Diagnosis not present

## 2021-11-11 DIAGNOSIS — I671 Cerebral aneurysm, nonruptured: Secondary | ICD-10-CM | POA: Diagnosis not present

## 2021-11-12 DIAGNOSIS — M9902 Segmental and somatic dysfunction of thoracic region: Secondary | ICD-10-CM | POA: Diagnosis not present

## 2021-11-12 DIAGNOSIS — M9901 Segmental and somatic dysfunction of cervical region: Secondary | ICD-10-CM | POA: Diagnosis not present

## 2021-11-12 DIAGNOSIS — M9905 Segmental and somatic dysfunction of pelvic region: Secondary | ICD-10-CM | POA: Diagnosis not present

## 2021-11-12 DIAGNOSIS — M9903 Segmental and somatic dysfunction of lumbar region: Secondary | ICD-10-CM | POA: Diagnosis not present

## 2021-11-16 DIAGNOSIS — M9905 Segmental and somatic dysfunction of pelvic region: Secondary | ICD-10-CM | POA: Diagnosis not present

## 2021-11-16 DIAGNOSIS — Z5181 Encounter for therapeutic drug level monitoring: Secondary | ICD-10-CM | POA: Diagnosis not present

## 2021-11-16 DIAGNOSIS — M9901 Segmental and somatic dysfunction of cervical region: Secondary | ICD-10-CM | POA: Diagnosis not present

## 2021-11-16 DIAGNOSIS — M9903 Segmental and somatic dysfunction of lumbar region: Secondary | ICD-10-CM | POA: Diagnosis not present

## 2021-11-16 DIAGNOSIS — Z940 Kidney transplant status: Secondary | ICD-10-CM | POA: Diagnosis not present

## 2021-11-16 DIAGNOSIS — D849 Immunodeficiency, unspecified: Secondary | ICD-10-CM | POA: Diagnosis not present

## 2021-11-16 DIAGNOSIS — M9902 Segmental and somatic dysfunction of thoracic region: Secondary | ICD-10-CM | POA: Diagnosis not present

## 2021-11-18 DIAGNOSIS — M9905 Segmental and somatic dysfunction of pelvic region: Secondary | ICD-10-CM | POA: Diagnosis not present

## 2021-11-18 DIAGNOSIS — M9903 Segmental and somatic dysfunction of lumbar region: Secondary | ICD-10-CM | POA: Diagnosis not present

## 2021-11-18 DIAGNOSIS — M9901 Segmental and somatic dysfunction of cervical region: Secondary | ICD-10-CM | POA: Diagnosis not present

## 2021-11-18 DIAGNOSIS — M9902 Segmental and somatic dysfunction of thoracic region: Secondary | ICD-10-CM | POA: Diagnosis not present

## 2021-11-20 DIAGNOSIS — M9903 Segmental and somatic dysfunction of lumbar region: Secondary | ICD-10-CM | POA: Diagnosis not present

## 2021-11-20 DIAGNOSIS — M9902 Segmental and somatic dysfunction of thoracic region: Secondary | ICD-10-CM | POA: Diagnosis not present

## 2021-11-20 DIAGNOSIS — M9905 Segmental and somatic dysfunction of pelvic region: Secondary | ICD-10-CM | POA: Diagnosis not present

## 2021-11-20 DIAGNOSIS — M9901 Segmental and somatic dysfunction of cervical region: Secondary | ICD-10-CM | POA: Diagnosis not present

## 2021-11-23 DIAGNOSIS — M9901 Segmental and somatic dysfunction of cervical region: Secondary | ICD-10-CM | POA: Diagnosis not present

## 2021-11-23 DIAGNOSIS — Z940 Kidney transplant status: Secondary | ICD-10-CM | POA: Diagnosis not present

## 2021-11-23 DIAGNOSIS — B259 Cytomegaloviral disease, unspecified: Secondary | ICD-10-CM | POA: Diagnosis not present

## 2021-11-23 DIAGNOSIS — D849 Immunodeficiency, unspecified: Secondary | ICD-10-CM | POA: Diagnosis not present

## 2021-11-23 DIAGNOSIS — M9903 Segmental and somatic dysfunction of lumbar region: Secondary | ICD-10-CM | POA: Diagnosis not present

## 2021-11-23 DIAGNOSIS — M9905 Segmental and somatic dysfunction of pelvic region: Secondary | ICD-10-CM | POA: Diagnosis not present

## 2021-11-23 DIAGNOSIS — Z5181 Encounter for therapeutic drug level monitoring: Secondary | ICD-10-CM | POA: Diagnosis not present

## 2021-11-23 DIAGNOSIS — M9902 Segmental and somatic dysfunction of thoracic region: Secondary | ICD-10-CM | POA: Diagnosis not present

## 2021-11-24 DIAGNOSIS — M9902 Segmental and somatic dysfunction of thoracic region: Secondary | ICD-10-CM | POA: Diagnosis not present

## 2021-11-24 DIAGNOSIS — M9903 Segmental and somatic dysfunction of lumbar region: Secondary | ICD-10-CM | POA: Diagnosis not present

## 2021-11-24 DIAGNOSIS — M9901 Segmental and somatic dysfunction of cervical region: Secondary | ICD-10-CM | POA: Diagnosis not present

## 2021-11-24 DIAGNOSIS — M9905 Segmental and somatic dysfunction of pelvic region: Secondary | ICD-10-CM | POA: Diagnosis not present

## 2021-11-25 DIAGNOSIS — M9901 Segmental and somatic dysfunction of cervical region: Secondary | ICD-10-CM | POA: Diagnosis not present

## 2021-11-25 DIAGNOSIS — M9902 Segmental and somatic dysfunction of thoracic region: Secondary | ICD-10-CM | POA: Diagnosis not present

## 2021-11-25 DIAGNOSIS — M9905 Segmental and somatic dysfunction of pelvic region: Secondary | ICD-10-CM | POA: Diagnosis not present

## 2021-11-25 DIAGNOSIS — M9903 Segmental and somatic dysfunction of lumbar region: Secondary | ICD-10-CM | POA: Diagnosis not present

## 2021-12-07 DIAGNOSIS — D849 Immunodeficiency, unspecified: Secondary | ICD-10-CM | POA: Diagnosis not present

## 2021-12-07 DIAGNOSIS — B259 Cytomegaloviral disease, unspecified: Secondary | ICD-10-CM | POA: Diagnosis not present

## 2021-12-07 DIAGNOSIS — Z940 Kidney transplant status: Secondary | ICD-10-CM | POA: Diagnosis not present

## 2021-12-07 DIAGNOSIS — Z5181 Encounter for therapeutic drug level monitoring: Secondary | ICD-10-CM | POA: Diagnosis not present

## 2021-12-14 DIAGNOSIS — M9902 Segmental and somatic dysfunction of thoracic region: Secondary | ICD-10-CM | POA: Diagnosis not present

## 2021-12-14 DIAGNOSIS — M9901 Segmental and somatic dysfunction of cervical region: Secondary | ICD-10-CM | POA: Diagnosis not present

## 2021-12-14 DIAGNOSIS — M9903 Segmental and somatic dysfunction of lumbar region: Secondary | ICD-10-CM | POA: Diagnosis not present

## 2021-12-14 DIAGNOSIS — M9905 Segmental and somatic dysfunction of pelvic region: Secondary | ICD-10-CM | POA: Diagnosis not present

## 2021-12-16 DIAGNOSIS — M5412 Radiculopathy, cervical region: Secondary | ICD-10-CM | POA: Diagnosis not present

## 2021-12-16 DIAGNOSIS — M9905 Segmental and somatic dysfunction of pelvic region: Secondary | ICD-10-CM | POA: Diagnosis not present

## 2021-12-16 DIAGNOSIS — M9901 Segmental and somatic dysfunction of cervical region: Secondary | ICD-10-CM | POA: Diagnosis not present

## 2021-12-16 DIAGNOSIS — M9903 Segmental and somatic dysfunction of lumbar region: Secondary | ICD-10-CM | POA: Diagnosis not present

## 2021-12-16 DIAGNOSIS — M9902 Segmental and somatic dysfunction of thoracic region: Secondary | ICD-10-CM | POA: Diagnosis not present

## 2021-12-17 DIAGNOSIS — Z940 Kidney transplant status: Secondary | ICD-10-CM | POA: Diagnosis not present

## 2021-12-17 DIAGNOSIS — B259 Cytomegaloviral disease, unspecified: Secondary | ICD-10-CM | POA: Diagnosis not present

## 2021-12-17 DIAGNOSIS — Z5181 Encounter for therapeutic drug level monitoring: Secondary | ICD-10-CM | POA: Diagnosis not present

## 2021-12-17 DIAGNOSIS — D849 Immunodeficiency, unspecified: Secondary | ICD-10-CM | POA: Diagnosis not present

## 2021-12-18 DIAGNOSIS — M9902 Segmental and somatic dysfunction of thoracic region: Secondary | ICD-10-CM | POA: Diagnosis not present

## 2021-12-18 DIAGNOSIS — M9903 Segmental and somatic dysfunction of lumbar region: Secondary | ICD-10-CM | POA: Diagnosis not present

## 2021-12-18 DIAGNOSIS — M5412 Radiculopathy, cervical region: Secondary | ICD-10-CM | POA: Diagnosis not present

## 2021-12-18 DIAGNOSIS — M9901 Segmental and somatic dysfunction of cervical region: Secondary | ICD-10-CM | POA: Diagnosis not present

## 2021-12-18 DIAGNOSIS — Z6825 Body mass index (BMI) 25.0-25.9, adult: Secondary | ICD-10-CM | POA: Diagnosis not present

## 2021-12-18 DIAGNOSIS — M9905 Segmental and somatic dysfunction of pelvic region: Secondary | ICD-10-CM | POA: Diagnosis not present

## 2021-12-18 DIAGNOSIS — M5416 Radiculopathy, lumbar region: Secondary | ICD-10-CM | POA: Diagnosis not present

## 2021-12-21 DIAGNOSIS — M9902 Segmental and somatic dysfunction of thoracic region: Secondary | ICD-10-CM | POA: Diagnosis not present

## 2021-12-21 DIAGNOSIS — M9901 Segmental and somatic dysfunction of cervical region: Secondary | ICD-10-CM | POA: Diagnosis not present

## 2021-12-21 DIAGNOSIS — M9905 Segmental and somatic dysfunction of pelvic region: Secondary | ICD-10-CM | POA: Diagnosis not present

## 2021-12-21 DIAGNOSIS — M9903 Segmental and somatic dysfunction of lumbar region: Secondary | ICD-10-CM | POA: Diagnosis not present

## 2021-12-23 DIAGNOSIS — M9905 Segmental and somatic dysfunction of pelvic region: Secondary | ICD-10-CM | POA: Diagnosis not present

## 2021-12-23 DIAGNOSIS — M9903 Segmental and somatic dysfunction of lumbar region: Secondary | ICD-10-CM | POA: Diagnosis not present

## 2021-12-23 DIAGNOSIS — M9901 Segmental and somatic dysfunction of cervical region: Secondary | ICD-10-CM | POA: Diagnosis not present

## 2021-12-23 DIAGNOSIS — M9902 Segmental and somatic dysfunction of thoracic region: Secondary | ICD-10-CM | POA: Diagnosis not present

## 2021-12-25 DIAGNOSIS — M9902 Segmental and somatic dysfunction of thoracic region: Secondary | ICD-10-CM | POA: Diagnosis not present

## 2021-12-25 DIAGNOSIS — M9903 Segmental and somatic dysfunction of lumbar region: Secondary | ICD-10-CM | POA: Diagnosis not present

## 2021-12-25 DIAGNOSIS — M9905 Segmental and somatic dysfunction of pelvic region: Secondary | ICD-10-CM | POA: Diagnosis not present

## 2021-12-25 DIAGNOSIS — M9901 Segmental and somatic dysfunction of cervical region: Secondary | ICD-10-CM | POA: Diagnosis not present

## 2021-12-30 DIAGNOSIS — E213 Hyperparathyroidism, unspecified: Secondary | ICD-10-CM | POA: Diagnosis not present

## 2021-12-30 DIAGNOSIS — D849 Immunodeficiency, unspecified: Secondary | ICD-10-CM | POA: Diagnosis not present

## 2021-12-30 DIAGNOSIS — E7800 Pure hypercholesterolemia, unspecified: Secondary | ICD-10-CM | POA: Diagnosis not present

## 2021-12-30 DIAGNOSIS — Z940 Kidney transplant status: Secondary | ICD-10-CM | POA: Diagnosis not present

## 2021-12-31 DIAGNOSIS — M9903 Segmental and somatic dysfunction of lumbar region: Secondary | ICD-10-CM | POA: Diagnosis not present

## 2021-12-31 DIAGNOSIS — M9905 Segmental and somatic dysfunction of pelvic region: Secondary | ICD-10-CM | POA: Diagnosis not present

## 2021-12-31 DIAGNOSIS — M9902 Segmental and somatic dysfunction of thoracic region: Secondary | ICD-10-CM | POA: Diagnosis not present

## 2021-12-31 DIAGNOSIS — M9901 Segmental and somatic dysfunction of cervical region: Secondary | ICD-10-CM | POA: Diagnosis not present

## 2022-01-06 DIAGNOSIS — M9902 Segmental and somatic dysfunction of thoracic region: Secondary | ICD-10-CM | POA: Diagnosis not present

## 2022-01-06 DIAGNOSIS — M9901 Segmental and somatic dysfunction of cervical region: Secondary | ICD-10-CM | POA: Diagnosis not present

## 2022-01-06 DIAGNOSIS — M9903 Segmental and somatic dysfunction of lumbar region: Secondary | ICD-10-CM | POA: Diagnosis not present

## 2022-01-06 DIAGNOSIS — M9905 Segmental and somatic dysfunction of pelvic region: Secondary | ICD-10-CM | POA: Diagnosis not present

## 2022-01-07 DIAGNOSIS — M9902 Segmental and somatic dysfunction of thoracic region: Secondary | ICD-10-CM | POA: Diagnosis not present

## 2022-01-07 DIAGNOSIS — M9905 Segmental and somatic dysfunction of pelvic region: Secondary | ICD-10-CM | POA: Diagnosis not present

## 2022-01-07 DIAGNOSIS — M9901 Segmental and somatic dysfunction of cervical region: Secondary | ICD-10-CM | POA: Diagnosis not present

## 2022-01-07 DIAGNOSIS — M9903 Segmental and somatic dysfunction of lumbar region: Secondary | ICD-10-CM | POA: Diagnosis not present

## 2022-01-08 DIAGNOSIS — M9901 Segmental and somatic dysfunction of cervical region: Secondary | ICD-10-CM | POA: Diagnosis not present

## 2022-01-08 DIAGNOSIS — M9903 Segmental and somatic dysfunction of lumbar region: Secondary | ICD-10-CM | POA: Diagnosis not present

## 2022-01-08 DIAGNOSIS — M9902 Segmental and somatic dysfunction of thoracic region: Secondary | ICD-10-CM | POA: Diagnosis not present

## 2022-01-08 DIAGNOSIS — M9905 Segmental and somatic dysfunction of pelvic region: Secondary | ICD-10-CM | POA: Diagnosis not present

## 2022-01-11 DIAGNOSIS — D3132 Benign neoplasm of left choroid: Secondary | ICD-10-CM | POA: Diagnosis not present

## 2022-01-11 DIAGNOSIS — M9901 Segmental and somatic dysfunction of cervical region: Secondary | ICD-10-CM | POA: Diagnosis not present

## 2022-01-11 DIAGNOSIS — H401121 Primary open-angle glaucoma, left eye, mild stage: Secondary | ICD-10-CM | POA: Diagnosis not present

## 2022-01-11 DIAGNOSIS — I120 Hypertensive chronic kidney disease with stage 5 chronic kidney disease or end stage renal disease: Secondary | ICD-10-CM | POA: Diagnosis not present

## 2022-01-11 DIAGNOSIS — M9902 Segmental and somatic dysfunction of thoracic region: Secondary | ICD-10-CM | POA: Diagnosis not present

## 2022-01-11 DIAGNOSIS — N179 Acute kidney failure, unspecified: Secondary | ICD-10-CM | POA: Diagnosis not present

## 2022-01-11 DIAGNOSIS — M9905 Segmental and somatic dysfunction of pelvic region: Secondary | ICD-10-CM | POA: Diagnosis not present

## 2022-01-11 DIAGNOSIS — H401112 Primary open-angle glaucoma, right eye, moderate stage: Secondary | ICD-10-CM | POA: Diagnosis not present

## 2022-01-11 DIAGNOSIS — B259 Cytomegaloviral disease, unspecified: Secondary | ICD-10-CM | POA: Diagnosis not present

## 2022-01-11 DIAGNOSIS — Z5181 Encounter for therapeutic drug level monitoring: Secondary | ICD-10-CM | POA: Diagnosis not present

## 2022-01-11 DIAGNOSIS — D849 Immunodeficiency, unspecified: Secondary | ICD-10-CM | POA: Diagnosis not present

## 2022-01-11 DIAGNOSIS — Z961 Presence of intraocular lens: Secondary | ICD-10-CM | POA: Diagnosis not present

## 2022-01-11 DIAGNOSIS — M9903 Segmental and somatic dysfunction of lumbar region: Secondary | ICD-10-CM | POA: Diagnosis not present

## 2022-01-11 DIAGNOSIS — Z940 Kidney transplant status: Secondary | ICD-10-CM | POA: Diagnosis not present

## 2022-01-11 DIAGNOSIS — H35371 Puckering of macula, right eye: Secondary | ICD-10-CM | POA: Diagnosis not present

## 2022-01-11 DIAGNOSIS — D899 Disorder involving the immune mechanism, unspecified: Secondary | ICD-10-CM | POA: Diagnosis not present

## 2022-01-11 DIAGNOSIS — N186 End stage renal disease: Secondary | ICD-10-CM | POA: Diagnosis not present

## 2022-01-11 DIAGNOSIS — G510 Bell's palsy: Secondary | ICD-10-CM | POA: Diagnosis not present

## 2022-01-11 DIAGNOSIS — H43813 Vitreous degeneration, bilateral: Secondary | ICD-10-CM | POA: Diagnosis not present

## 2022-01-13 DIAGNOSIS — M9903 Segmental and somatic dysfunction of lumbar region: Secondary | ICD-10-CM | POA: Diagnosis not present

## 2022-01-13 DIAGNOSIS — M9902 Segmental and somatic dysfunction of thoracic region: Secondary | ICD-10-CM | POA: Diagnosis not present

## 2022-01-13 DIAGNOSIS — M9901 Segmental and somatic dysfunction of cervical region: Secondary | ICD-10-CM | POA: Diagnosis not present

## 2022-01-13 DIAGNOSIS — M9905 Segmental and somatic dysfunction of pelvic region: Secondary | ICD-10-CM | POA: Diagnosis not present

## 2022-01-15 DIAGNOSIS — M9905 Segmental and somatic dysfunction of pelvic region: Secondary | ICD-10-CM | POA: Diagnosis not present

## 2022-01-15 DIAGNOSIS — M9903 Segmental and somatic dysfunction of lumbar region: Secondary | ICD-10-CM | POA: Diagnosis not present

## 2022-01-15 DIAGNOSIS — M9901 Segmental and somatic dysfunction of cervical region: Secondary | ICD-10-CM | POA: Diagnosis not present

## 2022-01-15 DIAGNOSIS — M9902 Segmental and somatic dysfunction of thoracic region: Secondary | ICD-10-CM | POA: Diagnosis not present

## 2022-01-19 DIAGNOSIS — B259 Cytomegaloviral disease, unspecified: Secondary | ICD-10-CM | POA: Diagnosis not present

## 2022-01-19 DIAGNOSIS — M9902 Segmental and somatic dysfunction of thoracic region: Secondary | ICD-10-CM | POA: Diagnosis not present

## 2022-01-19 DIAGNOSIS — M9903 Segmental and somatic dysfunction of lumbar region: Secondary | ICD-10-CM | POA: Diagnosis not present

## 2022-01-19 DIAGNOSIS — M9901 Segmental and somatic dysfunction of cervical region: Secondary | ICD-10-CM | POA: Diagnosis not present

## 2022-01-19 DIAGNOSIS — M9905 Segmental and somatic dysfunction of pelvic region: Secondary | ICD-10-CM | POA: Diagnosis not present

## 2022-01-19 DIAGNOSIS — Z940 Kidney transplant status: Secondary | ICD-10-CM | POA: Diagnosis not present

## 2022-01-19 DIAGNOSIS — Z5181 Encounter for therapeutic drug level monitoring: Secondary | ICD-10-CM | POA: Diagnosis not present

## 2022-01-19 DIAGNOSIS — D849 Immunodeficiency, unspecified: Secondary | ICD-10-CM | POA: Diagnosis not present

## 2022-01-20 DIAGNOSIS — M9902 Segmental and somatic dysfunction of thoracic region: Secondary | ICD-10-CM | POA: Diagnosis not present

## 2022-01-20 DIAGNOSIS — M9905 Segmental and somatic dysfunction of pelvic region: Secondary | ICD-10-CM | POA: Diagnosis not present

## 2022-01-20 DIAGNOSIS — M9901 Segmental and somatic dysfunction of cervical region: Secondary | ICD-10-CM | POA: Diagnosis not present

## 2022-01-20 DIAGNOSIS — M9903 Segmental and somatic dysfunction of lumbar region: Secondary | ICD-10-CM | POA: Diagnosis not present

## 2022-01-20 IMAGING — MR MR LUMBAR SPINE W/O CM
4 of 6 series · 18 of 48 positions shown · non-contrast
Comparison: 02/08/2015

CLINICAL DATA: Lumbar radiculopathy with burning pain in the right
buttocks. Right leg pain for 6 years

EXAM:
MRI LUMBAR SPINE WITHOUT CONTRAST
TECHNIQUE: Multiplanar, multisequence MR imaging of the lumbar spine was
performed. No intravenous contrast was administered.

[Series 5: T2 · sagittal · 4.0mm · 0.73mm/px · 5 of 17 slices shown (1 of 2)]
[im 1/17]
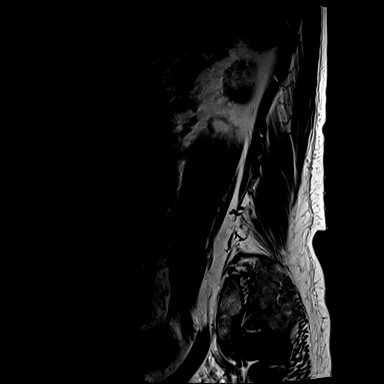
[im 5/17]
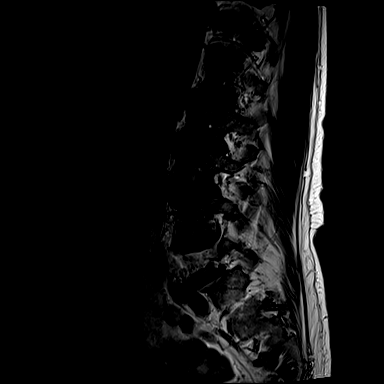
[im 9/17]
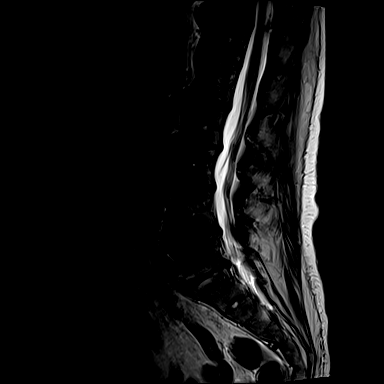
[im 13/17]
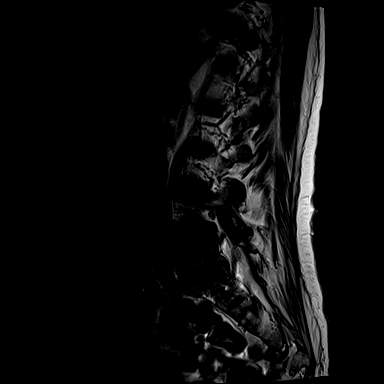
[im 17/17]
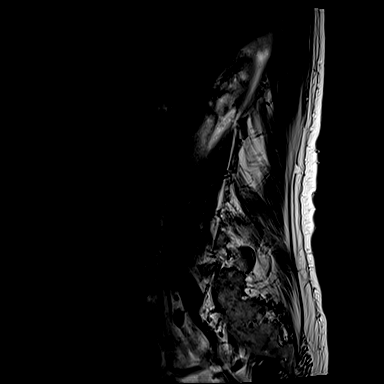

[Series 6: T1 · sagittal · 4.0mm · 0.73mm/px · 3 of 17 slices shown (1 of 2)]
[im 1/17]
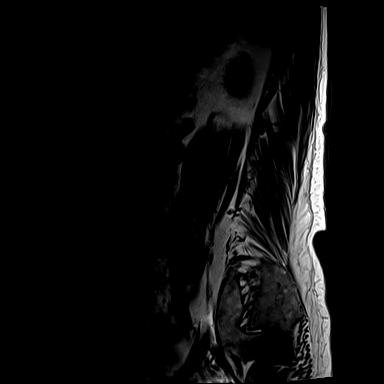
[im 9/17]
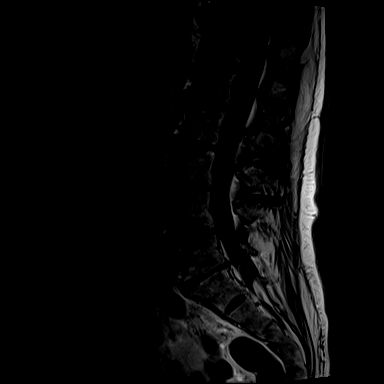
[im 17/17]
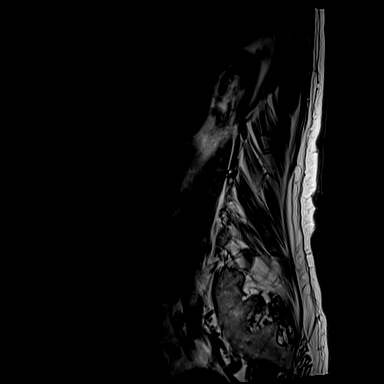

[Series 10: T1 · axial · 4.0mm · 0.28mm/px · z∈[+35,+175]mm · 3 of 39 slices shown (2 of 2)]
[im 7/39]
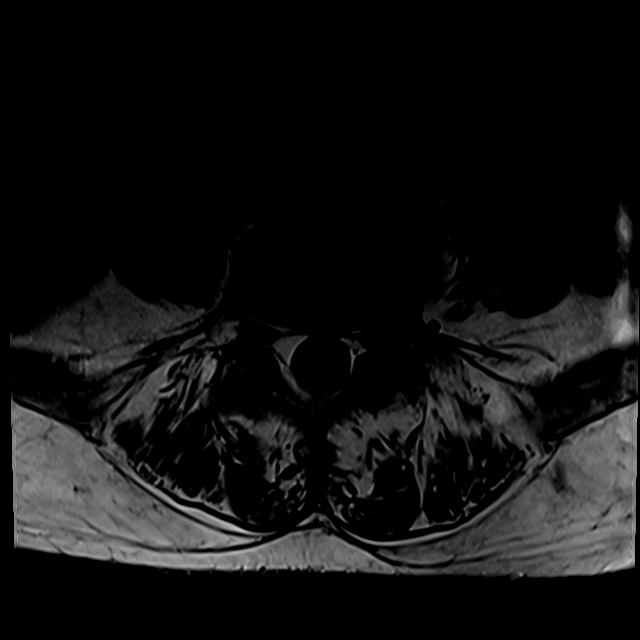
[im 20/39]
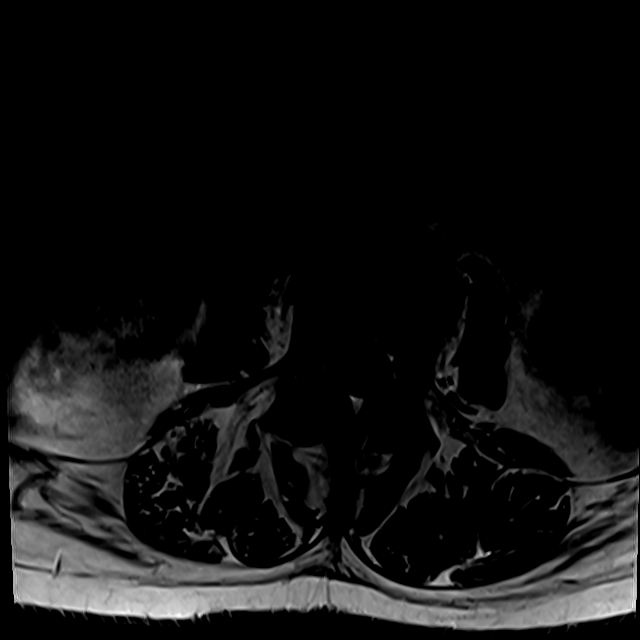
[im 32/39]
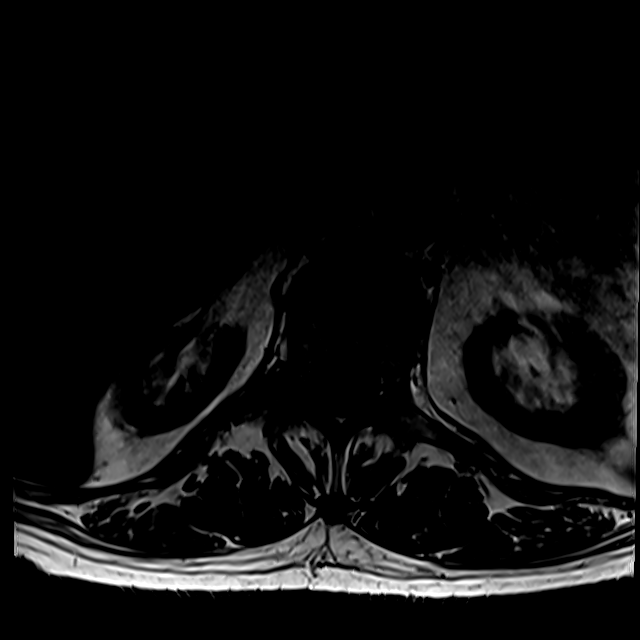

[Series 14: T2 · axial · 4.0mm · 0.28mm/px · z∈[+5,+175]mm · 7 of 39 slices shown (2 of 2)]
[im 1/39]
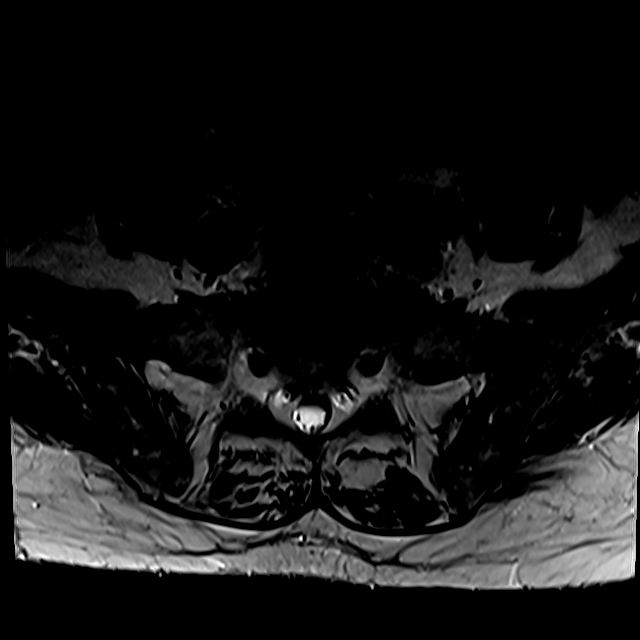
[im 7/39]
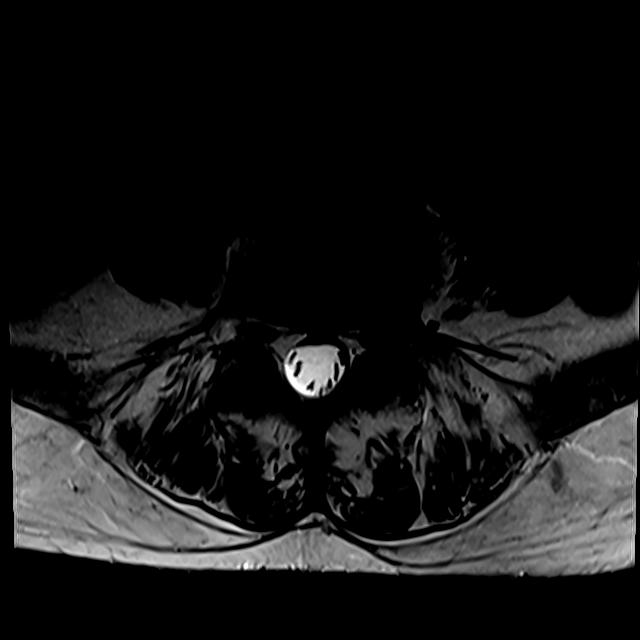
[im 13/39]
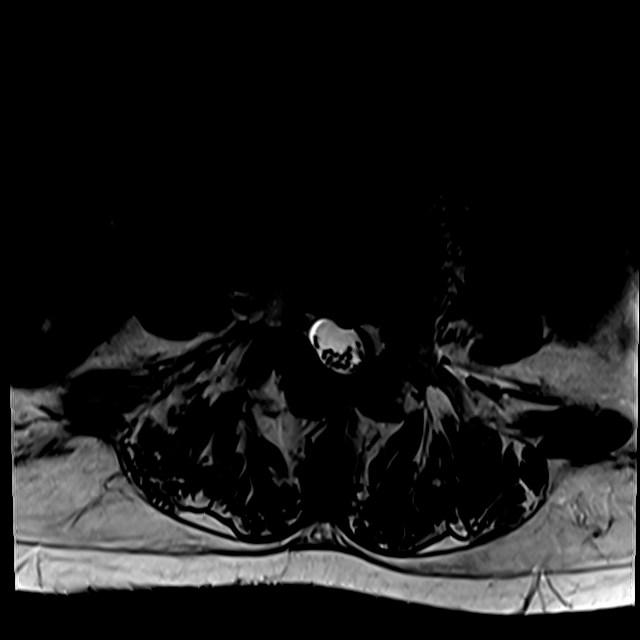
[im 16/39]
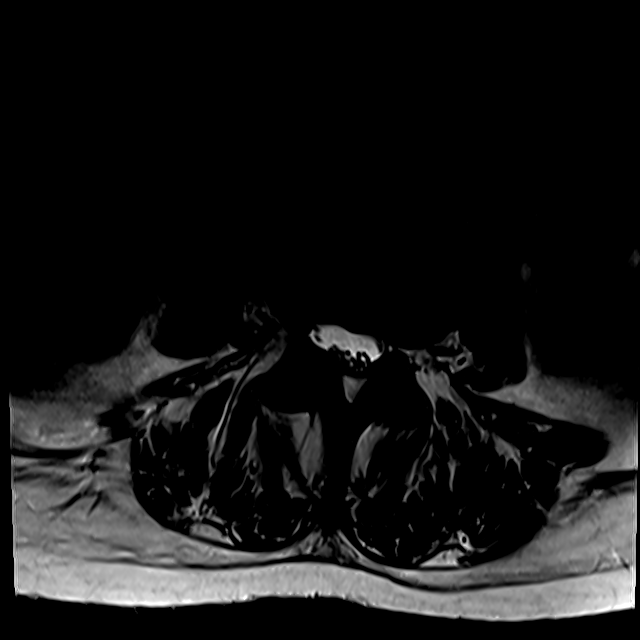
[im 20/39]
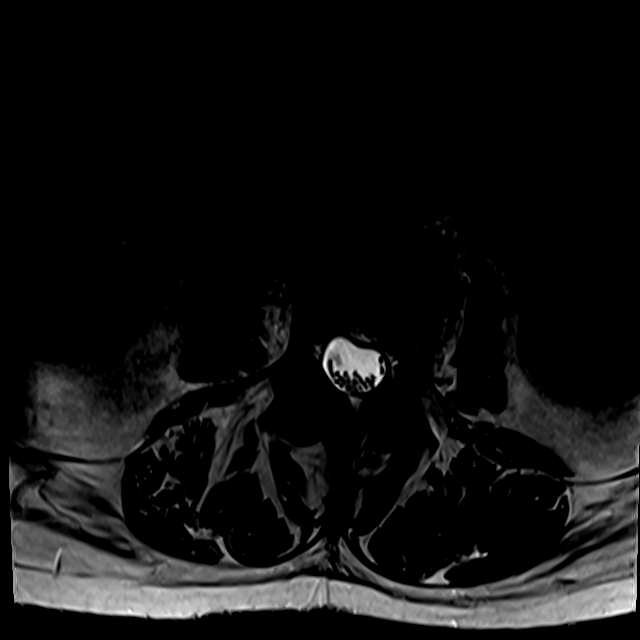
[im 23/39]
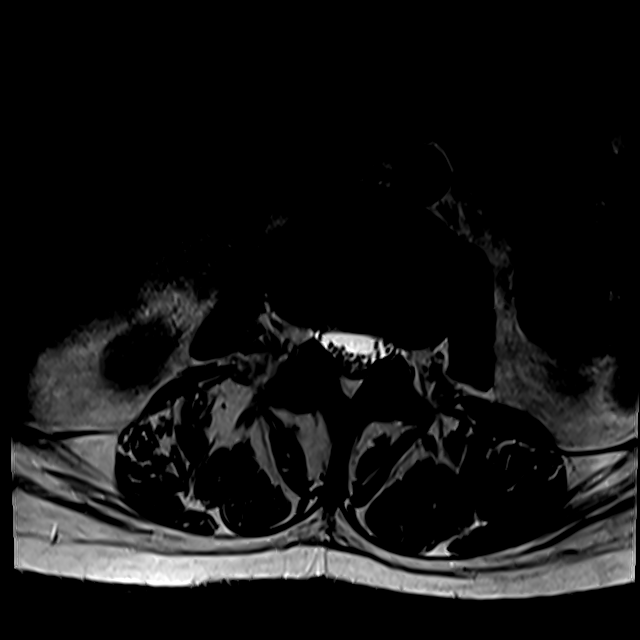
[im 32/39]
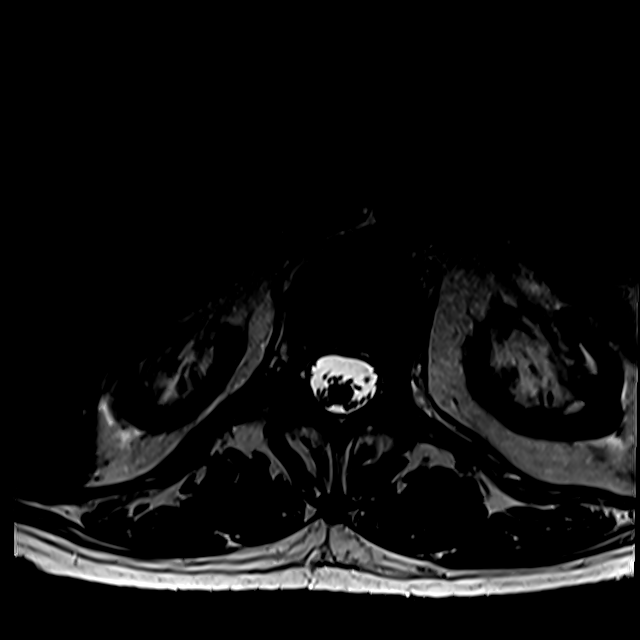

[18 of 48 positions shown; findings below may reference images not displayed]

FINDINGS: Segmentation:  5 lumbar type vertebrae

Alignment:  Levoscoliosis.

Vertebrae:  Remote L3 superior endplate fracture.

Conus medullaris and cauda equina: Conus extends to the L1-2 level.
Conus and cauda equina appear normal.

Paraspinal and other soft tissues: No evidence of perispinal mass or
inflammation. Renal atrophy.

Disc levels:

T12- L1: Unremarkable.

L1-L2: Ventral spondylitic spurring

L2-L3: Disc narrowing and bulging with mild right facet spurring.

L3-L4: Disc narrowing and bulging.  Mild facet spurring

L4-L5: Disc narrowing and bulging with left inferior foraminal
protrusion. Asymmetric left facet spurring. Left foraminal stenosis
is mild

L5-S1:Disc narrowing and bulging with endplate spurring eccentric to
the left. Negative facets.
IMPRESSION: Generalized lumbar spine degeneration similar to 8241. No
impingement to explain right leg symptoms.

## 2022-01-22 DIAGNOSIS — M9905 Segmental and somatic dysfunction of pelvic region: Secondary | ICD-10-CM | POA: Diagnosis not present

## 2022-01-22 DIAGNOSIS — M9902 Segmental and somatic dysfunction of thoracic region: Secondary | ICD-10-CM | POA: Diagnosis not present

## 2022-01-22 DIAGNOSIS — M9903 Segmental and somatic dysfunction of lumbar region: Secondary | ICD-10-CM | POA: Diagnosis not present

## 2022-01-22 DIAGNOSIS — M9901 Segmental and somatic dysfunction of cervical region: Secondary | ICD-10-CM | POA: Diagnosis not present

## 2022-01-25 DIAGNOSIS — M9903 Segmental and somatic dysfunction of lumbar region: Secondary | ICD-10-CM | POA: Diagnosis not present

## 2022-01-25 DIAGNOSIS — M9905 Segmental and somatic dysfunction of pelvic region: Secondary | ICD-10-CM | POA: Diagnosis not present

## 2022-01-25 DIAGNOSIS — M9901 Segmental and somatic dysfunction of cervical region: Secondary | ICD-10-CM | POA: Diagnosis not present

## 2022-01-25 DIAGNOSIS — M9902 Segmental and somatic dysfunction of thoracic region: Secondary | ICD-10-CM | POA: Diagnosis not present

## 2022-01-27 DIAGNOSIS — M9905 Segmental and somatic dysfunction of pelvic region: Secondary | ICD-10-CM | POA: Diagnosis not present

## 2022-01-27 DIAGNOSIS — M9903 Segmental and somatic dysfunction of lumbar region: Secondary | ICD-10-CM | POA: Diagnosis not present

## 2022-01-27 DIAGNOSIS — M9902 Segmental and somatic dysfunction of thoracic region: Secondary | ICD-10-CM | POA: Diagnosis not present

## 2022-01-27 DIAGNOSIS — M5441 Lumbago with sciatica, right side: Secondary | ICD-10-CM | POA: Diagnosis not present

## 2022-01-27 DIAGNOSIS — M9901 Segmental and somatic dysfunction of cervical region: Secondary | ICD-10-CM | POA: Diagnosis not present

## 2022-02-01 DIAGNOSIS — M9901 Segmental and somatic dysfunction of cervical region: Secondary | ICD-10-CM | POA: Diagnosis not present

## 2022-02-01 DIAGNOSIS — B259 Cytomegaloviral disease, unspecified: Secondary | ICD-10-CM | POA: Diagnosis not present

## 2022-02-01 DIAGNOSIS — M9903 Segmental and somatic dysfunction of lumbar region: Secondary | ICD-10-CM | POA: Diagnosis not present

## 2022-02-01 DIAGNOSIS — Z940 Kidney transplant status: Secondary | ICD-10-CM | POA: Diagnosis not present

## 2022-02-01 DIAGNOSIS — M9905 Segmental and somatic dysfunction of pelvic region: Secondary | ICD-10-CM | POA: Diagnosis not present

## 2022-02-01 DIAGNOSIS — M9902 Segmental and somatic dysfunction of thoracic region: Secondary | ICD-10-CM | POA: Diagnosis not present

## 2022-02-01 DIAGNOSIS — D849 Immunodeficiency, unspecified: Secondary | ICD-10-CM | POA: Diagnosis not present

## 2022-02-01 DIAGNOSIS — Z5181 Encounter for therapeutic drug level monitoring: Secondary | ICD-10-CM | POA: Diagnosis not present

## 2022-02-08 DIAGNOSIS — Z1231 Encounter for screening mammogram for malignant neoplasm of breast: Secondary | ICD-10-CM | POA: Diagnosis not present

## 2022-02-15 DIAGNOSIS — Z940 Kidney transplant status: Secondary | ICD-10-CM | POA: Diagnosis not present

## 2022-02-15 DIAGNOSIS — Z5181 Encounter for therapeutic drug level monitoring: Secondary | ICD-10-CM | POA: Diagnosis not present

## 2022-02-15 DIAGNOSIS — Z0100 Encounter for examination of eyes and vision without abnormal findings: Secondary | ICD-10-CM | POA: Diagnosis not present

## 2022-02-15 DIAGNOSIS — D849 Immunodeficiency, unspecified: Secondary | ICD-10-CM | POA: Diagnosis not present

## 2022-02-15 DIAGNOSIS — B259 Cytomegaloviral disease, unspecified: Secondary | ICD-10-CM | POA: Diagnosis not present

## 2022-02-23 DIAGNOSIS — A0472 Enterocolitis due to Clostridium difficile, not specified as recurrent: Secondary | ICD-10-CM | POA: Diagnosis not present

## 2022-02-23 DIAGNOSIS — Z940 Kidney transplant status: Secondary | ICD-10-CM | POA: Diagnosis not present

## 2022-03-01 DIAGNOSIS — D849 Immunodeficiency, unspecified: Secondary | ICD-10-CM | POA: Diagnosis not present

## 2022-03-01 DIAGNOSIS — Z940 Kidney transplant status: Secondary | ICD-10-CM | POA: Diagnosis not present

## 2022-03-01 DIAGNOSIS — Z5181 Encounter for therapeutic drug level monitoring: Secondary | ICD-10-CM | POA: Diagnosis not present

## 2022-03-01 DIAGNOSIS — B259 Cytomegaloviral disease, unspecified: Secondary | ICD-10-CM | POA: Diagnosis not present

## 2022-03-05 ENCOUNTER — Ambulatory Visit
Admission: RE | Admit: 2022-03-05 | Discharge: 2022-03-05 | Disposition: A | Discharge status: Home / Self Care | Payer: Medicare HMO | Source: Ambulatory Visit | Attending: Gastroenterology | Admitting: Gastroenterology

## 2022-03-05 ENCOUNTER — Other Ambulatory Visit: Payer: Self-pay | Admitting: Gastroenterology

## 2022-03-05 DIAGNOSIS — Z940 Kidney transplant status: Secondary | ICD-10-CM | POA: Diagnosis not present

## 2022-03-05 DIAGNOSIS — R197 Diarrhea, unspecified: Secondary | ICD-10-CM | POA: Diagnosis not present

## 2022-03-05 DIAGNOSIS — K5900 Constipation, unspecified: Secondary | ICD-10-CM

## 2022-03-10 DIAGNOSIS — D849 Immunodeficiency, unspecified: Secondary | ICD-10-CM | POA: Diagnosis not present

## 2022-03-10 DIAGNOSIS — B259 Cytomegaloviral disease, unspecified: Secondary | ICD-10-CM | POA: Diagnosis not present

## 2022-03-10 DIAGNOSIS — Z940 Kidney transplant status: Secondary | ICD-10-CM | POA: Diagnosis not present

## 2022-03-10 DIAGNOSIS — K219 Gastro-esophageal reflux disease without esophagitis: Secondary | ICD-10-CM | POA: Diagnosis not present

## 2022-03-22 DIAGNOSIS — Z5181 Encounter for therapeutic drug level monitoring: Secondary | ICD-10-CM | POA: Diagnosis not present

## 2022-03-22 DIAGNOSIS — Z940 Kidney transplant status: Secondary | ICD-10-CM | POA: Diagnosis not present

## 2022-03-22 DIAGNOSIS — B259 Cytomegaloviral disease, unspecified: Secondary | ICD-10-CM | POA: Diagnosis not present

## 2022-03-22 DIAGNOSIS — D849 Immunodeficiency, unspecified: Secondary | ICD-10-CM | POA: Diagnosis not present

## 2022-03-24 DIAGNOSIS — R197 Diarrhea, unspecified: Secondary | ICD-10-CM | POA: Diagnosis not present

## 2022-03-24 DIAGNOSIS — R194 Change in bowel habit: Secondary | ICD-10-CM | POA: Diagnosis not present

## 2022-03-24 DIAGNOSIS — K6389 Other specified diseases of intestine: Secondary | ICD-10-CM | POA: Diagnosis not present

## 2022-03-24 DIAGNOSIS — K648 Other hemorrhoids: Secondary | ICD-10-CM | POA: Diagnosis not present

## 2022-03-24 DIAGNOSIS — K5289 Other specified noninfective gastroenteritis and colitis: Secondary | ICD-10-CM | POA: Diagnosis not present

## 2022-03-24 DIAGNOSIS — K52832 Lymphocytic colitis: Secondary | ICD-10-CM | POA: Diagnosis not present

## 2022-04-05 DIAGNOSIS — Z5181 Encounter for therapeutic drug level monitoring: Secondary | ICD-10-CM | POA: Diagnosis not present

## 2022-04-05 DIAGNOSIS — D849 Immunodeficiency, unspecified: Secondary | ICD-10-CM | POA: Diagnosis not present

## 2022-04-05 DIAGNOSIS — Z940 Kidney transplant status: Secondary | ICD-10-CM | POA: Diagnosis not present

## 2022-04-05 DIAGNOSIS — B259 Cytomegaloviral disease, unspecified: Secondary | ICD-10-CM | POA: Diagnosis not present

## 2022-04-09 DIAGNOSIS — E7800 Pure hypercholesterolemia, unspecified: Secondary | ICD-10-CM | POA: Diagnosis not present

## 2022-04-09 DIAGNOSIS — R69 Illness, unspecified: Secondary | ICD-10-CM | POA: Diagnosis not present

## 2022-04-09 DIAGNOSIS — E039 Hypothyroidism, unspecified: Secondary | ICD-10-CM | POA: Diagnosis not present

## 2022-04-09 DIAGNOSIS — M810 Age-related osteoporosis without current pathological fracture: Secondary | ICD-10-CM | POA: Diagnosis not present

## 2022-04-09 DIAGNOSIS — Z940 Kidney transplant status: Secondary | ICD-10-CM | POA: Diagnosis not present

## 2022-04-09 DIAGNOSIS — I700 Atherosclerosis of aorta: Secondary | ICD-10-CM | POA: Diagnosis not present

## 2022-04-09 DIAGNOSIS — Z0000 Encounter for general adult medical examination without abnormal findings: Secondary | ICD-10-CM | POA: Diagnosis not present

## 2022-04-15 ENCOUNTER — Ambulatory Visit
Admission: RE | Admit: 2022-04-15 | Discharge: 2022-04-15 | Disposition: A | Discharge status: Home / Self Care | Payer: Medicare HMO | Source: Ambulatory Visit | Attending: Internal Medicine | Admitting: Internal Medicine

## 2022-04-15 ENCOUNTER — Other Ambulatory Visit: Payer: Self-pay | Admitting: Internal Medicine

## 2022-04-15 DIAGNOSIS — W19XXXA Unspecified fall, initial encounter: Secondary | ICD-10-CM

## 2022-04-15 DIAGNOSIS — S299XXA Unspecified injury of thorax, initial encounter: Secondary | ICD-10-CM | POA: Diagnosis not present

## 2022-04-15 DIAGNOSIS — S2241XA Multiple fractures of ribs, right side, initial encounter for closed fracture: Secondary | ICD-10-CM | POA: Diagnosis not present

## 2022-04-15 DIAGNOSIS — J984 Other disorders of lung: Secondary | ICD-10-CM | POA: Diagnosis not present

## 2022-04-19 DIAGNOSIS — M8589 Other specified disorders of bone density and structure, multiple sites: Secondary | ICD-10-CM | POA: Diagnosis not present

## 2022-04-26 DIAGNOSIS — I129 Hypertensive chronic kidney disease with stage 1 through stage 4 chronic kidney disease, or unspecified chronic kidney disease: Secondary | ICD-10-CM | POA: Diagnosis not present

## 2022-04-26 DIAGNOSIS — M810 Age-related osteoporosis without current pathological fracture: Secondary | ICD-10-CM | POA: Diagnosis not present

## 2022-04-26 DIAGNOSIS — Z940 Kidney transplant status: Secondary | ICD-10-CM | POA: Diagnosis not present

## 2022-05-03 DIAGNOSIS — B259 Cytomegaloviral disease, unspecified: Secondary | ICD-10-CM | POA: Diagnosis not present

## 2022-05-03 DIAGNOSIS — Z940 Kidney transplant status: Secondary | ICD-10-CM | POA: Diagnosis not present

## 2022-05-03 DIAGNOSIS — D849 Immunodeficiency, unspecified: Secondary | ICD-10-CM | POA: Diagnosis not present

## 2022-05-03 DIAGNOSIS — Z5181 Encounter for therapeutic drug level monitoring: Secondary | ICD-10-CM | POA: Diagnosis not present

## 2022-05-04 DIAGNOSIS — C44629 Squamous cell carcinoma of skin of left upper limb, including shoulder: Secondary | ICD-10-CM | POA: Diagnosis not present

## 2022-05-04 DIAGNOSIS — D485 Neoplasm of uncertain behavior of skin: Secondary | ICD-10-CM | POA: Diagnosis not present

## 2022-05-11 DIAGNOSIS — C44619 Basal cell carcinoma of skin of left upper limb, including shoulder: Secondary | ICD-10-CM | POA: Diagnosis not present

## 2022-05-11 DIAGNOSIS — D485 Neoplasm of uncertain behavior of skin: Secondary | ICD-10-CM | POA: Diagnosis not present

## 2022-05-11 DIAGNOSIS — C44629 Squamous cell carcinoma of skin of left upper limb, including shoulder: Secondary | ICD-10-CM | POA: Diagnosis not present

## 2022-05-12 DIAGNOSIS — Z0100 Encounter for examination of eyes and vision without abnormal findings: Secondary | ICD-10-CM | POA: Diagnosis not present

## 2022-05-12 DIAGNOSIS — G510 Bell's palsy: Secondary | ICD-10-CM | POA: Diagnosis not present

## 2022-05-12 DIAGNOSIS — H524 Presbyopia: Secondary | ICD-10-CM | POA: Diagnosis not present

## 2022-05-12 DIAGNOSIS — H43813 Vitreous degeneration, bilateral: Secondary | ICD-10-CM | POA: Diagnosis not present

## 2022-05-12 DIAGNOSIS — H401121 Primary open-angle glaucoma, left eye, mild stage: Secondary | ICD-10-CM | POA: Diagnosis not present

## 2022-05-12 DIAGNOSIS — H401112 Primary open-angle glaucoma, right eye, moderate stage: Secondary | ICD-10-CM | POA: Diagnosis not present

## 2022-05-12 DIAGNOSIS — Z961 Presence of intraocular lens: Secondary | ICD-10-CM | POA: Diagnosis not present

## 2022-05-12 DIAGNOSIS — H35371 Puckering of macula, right eye: Secondary | ICD-10-CM | POA: Diagnosis not present

## 2022-05-12 DIAGNOSIS — D3132 Benign neoplasm of left choroid: Secondary | ICD-10-CM | POA: Diagnosis not present

## 2022-05-13 DIAGNOSIS — K5900 Constipation, unspecified: Secondary | ICD-10-CM | POA: Diagnosis not present

## 2022-05-13 DIAGNOSIS — Z940 Kidney transplant status: Secondary | ICD-10-CM | POA: Diagnosis not present

## 2022-05-13 DIAGNOSIS — K529 Noninfective gastroenteritis and colitis, unspecified: Secondary | ICD-10-CM | POA: Diagnosis not present

## 2022-05-18 DIAGNOSIS — K529 Noninfective gastroenteritis and colitis, unspecified: Secondary | ICD-10-CM | POA: Diagnosis not present

## 2022-05-21 DIAGNOSIS — E039 Hypothyroidism, unspecified: Secondary | ICD-10-CM | POA: Diagnosis not present

## 2022-05-26 DIAGNOSIS — Z940 Kidney transplant status: Secondary | ICD-10-CM | POA: Diagnosis not present

## 2022-05-26 DIAGNOSIS — R197 Diarrhea, unspecified: Secondary | ICD-10-CM | POA: Diagnosis not present

## 2022-05-26 DIAGNOSIS — M8000XD Age-related osteoporosis with current pathological fracture, unspecified site, subsequent encounter for fracture with routine healing: Secondary | ICD-10-CM | POA: Diagnosis not present

## 2022-05-26 DIAGNOSIS — R42 Dizziness and giddiness: Secondary | ICD-10-CM | POA: Diagnosis not present

## 2022-05-26 DIAGNOSIS — D849 Immunodeficiency, unspecified: Secondary | ICD-10-CM | POA: Diagnosis not present

## 2022-07-06 DIAGNOSIS — E039 Hypothyroidism, unspecified: Secondary | ICD-10-CM | POA: Diagnosis not present

## 2022-07-12 DIAGNOSIS — L570 Actinic keratosis: Secondary | ICD-10-CM | POA: Diagnosis not present

## 2022-07-12 DIAGNOSIS — C44619 Basal cell carcinoma of skin of left upper limb, including shoulder: Secondary | ICD-10-CM | POA: Diagnosis not present

## 2022-07-12 DIAGNOSIS — D485 Neoplasm of uncertain behavior of skin: Secondary | ICD-10-CM | POA: Diagnosis not present

## 2022-07-12 DIAGNOSIS — C44629 Squamous cell carcinoma of skin of left upper limb, including shoulder: Secondary | ICD-10-CM | POA: Diagnosis not present

## 2022-08-09 DIAGNOSIS — D849 Immunodeficiency, unspecified: Secondary | ICD-10-CM | POA: Diagnosis not present

## 2022-08-09 DIAGNOSIS — Z5181 Encounter for therapeutic drug level monitoring: Secondary | ICD-10-CM | POA: Diagnosis not present

## 2022-08-09 DIAGNOSIS — E7800 Pure hypercholesterolemia, unspecified: Secondary | ICD-10-CM | POA: Diagnosis not present

## 2022-08-09 DIAGNOSIS — Z940 Kidney transplant status: Secondary | ICD-10-CM | POA: Diagnosis not present

## 2022-08-09 DIAGNOSIS — B259 Cytomegaloviral disease, unspecified: Secondary | ICD-10-CM | POA: Diagnosis not present

## 2022-08-11 DIAGNOSIS — C44629 Squamous cell carcinoma of skin of left upper limb, including shoulder: Secondary | ICD-10-CM | POA: Diagnosis not present

## 2022-08-12 DIAGNOSIS — L821 Other seborrheic keratosis: Secondary | ICD-10-CM | POA: Diagnosis not present

## 2022-08-12 DIAGNOSIS — D485 Neoplasm of uncertain behavior of skin: Secondary | ICD-10-CM | POA: Diagnosis not present

## 2022-08-12 DIAGNOSIS — Z85828 Personal history of other malignant neoplasm of skin: Secondary | ICD-10-CM | POA: Diagnosis not present

## 2022-08-12 DIAGNOSIS — D225 Melanocytic nevi of trunk: Secondary | ICD-10-CM | POA: Diagnosis not present

## 2022-08-12 DIAGNOSIS — D229 Melanocytic nevi, unspecified: Secondary | ICD-10-CM | POA: Diagnosis not present

## 2022-08-12 DIAGNOSIS — Z86018 Personal history of other benign neoplasm: Secondary | ICD-10-CM | POA: Diagnosis not present

## 2022-08-12 DIAGNOSIS — L570 Actinic keratosis: Secondary | ICD-10-CM | POA: Diagnosis not present

## 2022-09-21 DIAGNOSIS — H35371 Puckering of macula, right eye: Secondary | ICD-10-CM | POA: Diagnosis not present

## 2022-09-21 DIAGNOSIS — D3132 Benign neoplasm of left choroid: Secondary | ICD-10-CM | POA: Diagnosis not present

## 2022-09-21 DIAGNOSIS — H43813 Vitreous degeneration, bilateral: Secondary | ICD-10-CM | POA: Diagnosis not present

## 2022-09-21 DIAGNOSIS — Z961 Presence of intraocular lens: Secondary | ICD-10-CM | POA: Diagnosis not present

## 2022-09-21 DIAGNOSIS — H401112 Primary open-angle glaucoma, right eye, moderate stage: Secondary | ICD-10-CM | POA: Diagnosis not present

## 2022-09-21 DIAGNOSIS — H401121 Primary open-angle glaucoma, left eye, mild stage: Secondary | ICD-10-CM | POA: Diagnosis not present

## 2022-09-21 DIAGNOSIS — G510 Bell's palsy: Secondary | ICD-10-CM | POA: Diagnosis not present

## 2022-11-05 DIAGNOSIS — G959 Disease of spinal cord, unspecified: Secondary | ICD-10-CM | POA: Diagnosis not present

## 2022-11-05 DIAGNOSIS — Z6829 Body mass index (BMI) 29.0-29.9, adult: Secondary | ICD-10-CM | POA: Diagnosis not present

## 2022-11-10 ENCOUNTER — Other Ambulatory Visit: Payer: Self-pay | Admitting: Neurological Surgery

## 2022-11-10 DIAGNOSIS — Z23 Encounter for immunization: Secondary | ICD-10-CM | POA: Diagnosis not present

## 2022-11-10 DIAGNOSIS — G959 Disease of spinal cord, unspecified: Secondary | ICD-10-CM

## 2022-11-10 DIAGNOSIS — D84821 Immunodeficiency due to drugs: Secondary | ICD-10-CM | POA: Diagnosis not present

## 2022-11-10 DIAGNOSIS — H8113 Benign paroxysmal vertigo, bilateral: Secondary | ICD-10-CM | POA: Diagnosis not present

## 2022-11-10 DIAGNOSIS — M810 Age-related osteoporosis without current pathological fracture: Secondary | ICD-10-CM | POA: Diagnosis not present

## 2022-11-10 DIAGNOSIS — Z940 Kidney transplant status: Secondary | ICD-10-CM | POA: Diagnosis not present

## 2022-11-10 DIAGNOSIS — I129 Hypertensive chronic kidney disease with stage 1 through stage 4 chronic kidney disease, or unspecified chronic kidney disease: Secondary | ICD-10-CM | POA: Diagnosis not present

## 2022-11-10 DIAGNOSIS — M48062 Spinal stenosis, lumbar region with neurogenic claudication: Secondary | ICD-10-CM | POA: Diagnosis not present

## 2022-11-19 ENCOUNTER — Encounter: Payer: Self-pay | Admitting: Physical Therapy

## 2022-11-19 ENCOUNTER — Ambulatory Visit: Payer: Medicare HMO | Attending: Vascular Surgery | Admitting: Physical Therapy

## 2022-11-19 DIAGNOSIS — M6281 Muscle weakness (generalized): Secondary | ICD-10-CM | POA: Insufficient documentation

## 2022-11-19 DIAGNOSIS — M5431 Sciatica, right side: Secondary | ICD-10-CM | POA: Diagnosis not present

## 2022-11-19 DIAGNOSIS — R2681 Unsteadiness on feet: Secondary | ICD-10-CM | POA: Diagnosis not present

## 2022-11-19 DIAGNOSIS — R2689 Other abnormalities of gait and mobility: Secondary | ICD-10-CM | POA: Diagnosis not present

## 2022-11-19 DIAGNOSIS — R296 Repeated falls: Secondary | ICD-10-CM | POA: Diagnosis not present

## 2022-11-19 NOTE — Therapy (Signed)
OUTPATIENT PHYSICAL THERAPY NEURO EVALUATION   Patient Name: Cristina Wilson MRN: [id number] DOB:1950/04/10, 72 y.o., female Today's Date: 11/19/2022   PCP: Merlene Laughter, MD REFERRING PROVIDER: Joaquim Nam, MD  END OF SESSION:  PT End of Session - 11/19/22 1113     Visit Number 1    Number of Visits 13    Date for PT Re-Evaluation 01/14/23    Authorization Type [employer]    PT Start Time 1108   Pt arrived late   PT Stop Time 1145    PT Time Calculation (min) 37 min    Activity Tolerance Patient tolerated treatment well    Behavior During Therapy [facility] for tasks assessed/performed             Past Medical History:  Diagnosis Date   Abdominal pain 06/06/2018   Acute renal failure (ARF) (HCC) 06/22/2018   Anemia in chronic kidney disease 08/25/2018   Anemia, unspecified 07/05/2018   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 states she has 2 aneurysms   Chronic low back pain 04/12/2017   Chronic pain syndrome 04/12/2017   Closed fracture of left distal radius    De Quervain's tenosynovitis 02/10/2018   Degeneration of lumbar intervertebral disc 03/01/2017   End stage renal failure on dialysis [facility])    M/W/F on [employer]   GERD (gastroesophageal reflux disease)    Goodpasture syndrome (HCC)    Headache 10/30/2014   Hyperlipidemia 04/17/2018   Hypertension    Hypothyroidism    Iron deficiency anemia, unspecified 07/12/2018   PONV (postoperative nausea and vomiting)    violent vomiting   Sacral back pain 05/25/2016   Scoliosis of lumbar spine 03/01/2017   Secondary hyperparathyroidism of renal origin (HCC) 08/22/2018   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   Thyroid disease    Trigger finger of left thumb 02/10/2018   Past Surgical History:  Procedure Laterality Date   ARTERY BIOPSY Right 10/31/2014   Procedure: BIOPSY TEMPORAL  ARTERY-RIGHT;  Surgeon: Pryor Ochoa, MD;  Location[facility];  Service: Vascular;  Laterality: Right;   AV FISTULA PLACEMENT Left 10/11/2018   Procedure: left arm ARTERIOVENOUS (AV) FISTULA  creation;  Surgeon: Maeola Harman, MD;  Location: [facility] OR;  Service: Vascular;  Laterality: Left;   BILATERAL CARPAL TUNNEL RELEASE     BIOPSY  04/29/2019   Procedure: BIOPSY;  Surgeon: Kathi Der, MD;  Location: [facility] ENDOSCOPY;  Service: Gastroenterology;;   BIOPSY  11/05/2019   Procedure: BIOPSY;  Surgeon: Kathi Der, MD;  Location: [facility];  Service: Gastroenterology;;   BREAST SURGERY Left    lumpectomy   BUNIONECTOMY Right    CHOLECYSTECTOMY N/A 10/13/2016   Procedure: LAPAROSCOPIC CHOLECYSTECTOMY WITH INTRAOPERATIVE CHOLANGIOGRAM;  Surgeon: Manus Rudd, MD;  Location: [facility] OR;  Service: General;  Laterality: N/A;   COLONOSCOPY WITH PROPOFOL N/A 11/05/2019   Procedure: COLONOSCOPY WITH PROPOFOL;  Surgeon: Kathi Der, MD;  Location: [facility];  Service: Gastroenterology;  Laterality: N/A;   ESOPHAGOGASTRODUODENOSCOPY (EGD) WITH PROPOFOL N/A 04/29/2019   Procedure: ESOPHAGOGASTRODUODENOSCOPY (EGD) WITH PROPOFOL;  Surgeon: Kathi Der, MD;  Location: [facility];  Service: Gastroenterology;  Laterality: N/A;   EYE SURGERY     surgery for cross eye   FOOT FRACTURE SURGERY     OPEN REDUCTION INTERNAL FIXATION (ORIF) DISTAL RADIAL FRACTURE Left 03/29/2017   Procedure: OPEN REDUCTION  INTERNAL FIXATION (ORIF)LEFT  DISTAL RADIAL FRACTURE;  Surgeon: Betha Loa, MD;  Location: [facility];  Service: Orthopedics;  Laterality: Left;   POLYPECTOMY  11/05/2019   Procedure: POLYPECTOMY;  Surgeon: Kathi Der, MD;  Location: [facility];  Service: Gastroenterology;;   THYROIDECTOMY     TONSILLECTOMY     WISDOM TOOTH EXTRACTION     Patient Active Problem List   Diagnosis Date Noted   Hypertensive crisis 10/23/2019   History of cerebral aneurysm  10/23/2019   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   Hypoxemia 02/21/2019   Pulmonary edema 03/17/2019   Encounter for immunization 09/26/2018   Anemia in chronic kidney disease 08/25/2018   End stage renal disease (HCC) 08/25/2018   Secondary hyperparathyroidism of renal origin (HCC) 08/22/2018   Referred otalgia of left ear 08/16/2018   Iron deficiency anemia, unspecified 07/12/2018   Shortness of breath 07/06/2018   Anemia, unspecified 07/05/2018   Anxiety 07/05/2018   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   Acute renal failure (ARF) (HCC) 06/22/2018   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   Abdominal pain 06/06/2018   Hydronephrosis due to obstruction of ureteral orifice 06/06/2018   Disease of thyroid gland 04/17/2018   Hyperlipidemia 04/17/2018   Hypertension 04/17/2018   De Quervain's tenosynovitis 02/10/2018   Trigger finger of left thumb 02/10/2018   Chronic low back pain 04/12/2017   Chronic pain syndrome 04/12/2017   Degeneration of lumbar intervertebral disc 03/01/2017   Scoliosis of lumbar spine 03/01/2017   Sacral back pain 05/25/2016   Eye pain 10/30/2014    ONSET DATE: 11/10/2022 (referral)   REFERRING DIAG:  H08.657 (ICD-10-CM) - Spinal stenosis, lumbar region with neurogenic claudication    THERAPY DIAG:  Muscle weakness (generalized)  Other abnormalities of gait and mobility  Unsteadiness on feet  Repeated falls  Sciatica, right side  Rationale for Evaluation and Treatment: Rehabilitation  SUBJECTIVE:                                                                                                                                                                                             SUBJECTIVE STATEMENT: Pt reports she has "had a fit" with her RLE for at least 5 years. Has had 5 falls this year, all when walking or when turning too quickly. "Every time I fall, I draw blood".States her RLE is constantly  burning, pain starts from mid butt and radiates down the back of her leg. Has three aneurysms in her brain as well. Is going to be receiving lumbar distraction from her chiropractor. Had a kidney transplant two years ago and states she  is very deconditioned which is likely adding to her instability.   Pt accompanied by: self  PERTINENT HISTORY: hypothyroidism; HTN; HLD; Goodpasture syndrome; cerebral aneurysm; ESRD on TTS HD; and chronic pain, kidney replacement (12/14/2020).   PAIN:  Are you having pain? Yes: NPRS scale: 6-7/10 Pain location: RLE Pain description: shooting pain Aggravating factors: "probably this workout"  Relieving factors: Heat/ice   PRECAUTIONS: Fall  RED FLAGS: None   WEIGHT BEARING RESTRICTIONS: No  FALLS: Has patient fallen in last 6 months? Yes. Number of falls %  LIVING ENVIRONMENT: Lives with:  her dog, Snickers  Lives in: House/apartment Stairs: Yes: External: 5 in carport w/bilateral rails and 2 in back w/no rails steps; see previous Has following equipment at home: Single point cane, Walker - 2 wheeled, shower chair, and walking sticks   PLOF: Independent  PATIENT GOALS: "To establish mobility so that I do not fall and I feel confident"   OBJECTIVE:  Note: Objective measures were completed at Evaluation unless otherwise noted.  DIAGNOSTIC FINDINGS: MRI of lumbar spine from 2022   Disc levels:   T12- L1: Unremarkable.   L1-L2: Ventral spondylitic spurring   L2-L3: Disc narrowing and bulging with mild right facet spurring.   L3-L4: Disc narrowing and bulging.  Mild facet spurring   L4-L5: Disc narrowing and bulging with left inferior foraminal protrusion. Asymmetric left facet spurring. Left foraminal stenosis is mild   L5-S1:Disc narrowing and bulging with endplate spurring eccentric to the left. Negative facets.   IMPRESSION: Generalized lumbar spine degeneration similar to 2017. No impingement to explain right leg  symptoms.  COGNITION: Overall cognitive status: Within functional limits for tasks assessed   SENSATION: Shooting pain down her RLE that follows sciatic n. Distribution    POSTURE: rounded shoulders and forward head  LOWER EXTREMITY MMT:  Tested in seated position   MMT Right Eval Left Eval  Hip flexion 4- 4  Hip extension    Hip abduction 5 5  Hip adduction 5 5  Hip internal rotation    Hip external rotation    Knee flexion 4+ 5  Knee extension 5 5  Ankle dorsiflexion 4 5  Ankle plantarflexion    Ankle inversion    Ankle eversion    (Blank rows = not tested)  BED MOBILITY:  Independent per pt  TRANSFERS: Assistive device utilized: None  Sit to stand: Modified independence Stand to sit: Modified independence   GAIT: Gait pattern: step through pattern, decreased arm swing- Right, decreased arm swing- Left, decreased stride length, decreased hip/knee flexion- Right, decreased ankle dorsiflexion- Right, lateral hip instability, and wide BOS Distance walked: Various clinic distances  Assistive device utilized: None Level of assistance: SBA Comments: Pt demonstrates waddle-like gait pattern w/very wide BOS. Noted uncompensated trendelenburg on RLE as well.   FUNCTIONAL TESTS:   [facility] PT Assessment - 11/19/22 1138       Transfers   Five time sit to stand comments  11.72   no UE support, wide BOS            TODAY'S TREATMENT:      Next Session  PATIENT EDUCATION: Education details: POC, eval findings  Person educated: Patient Education method: Explanation Education comprehension: verbalized understanding  HOME EXERCISE PROGRAM: Verbally added seated march overs on eval   GOALS: Goals reviewed with patient? Yes  SHORT TERM GOALS: Target date: 12/17/2022   Pt will be independent with initial  HEP for improved strength, balance,  transfers and gait.  Baseline: Goal status: INITIAL  2.  FGA to be assessed and STG/LTG updated  Baseline:  Goal status: INITIAL  3.  MCTSIB to be assessed and STG/LTG updated  Baseline:  Goal status: INITIAL  4.  to be assessed and goal updated  Baseline:  Goal status: INITIAL   LONG TERM GOALS: Target date: 12/31/2022    Pt will be independent with final HEP for improved strength, balance, transfers and gait.  Baseline:  Goal status: INITIAL  2.  FGA goal  Baseline:  Goal status: INITIAL  3.  MCTSIB goal  Baseline:  Goal status: INITIAL  4.  goal  Baseline:  Goal status: INITIAL   ASSESSMENT:  CLINICAL IMPRESSION: Patient is a 72 year old female referred to Neuro [facility] for spinal stenosis. Pt's PMH is significant for: hypothyroidism; HTN; HLD; Goodpasture syndrome; cerebral aneurysm; ESRD on TTS HD; and chronic pain. The following deficits were present during the exam: impaired sensation, decreased functional strength, decreased activity tolerance and impaired balance. Based on falls history and gait abnormalities, pt is an incr risk for falls. Pt would benefit from skilled PT to address these impairments and functional limitations to maximize functional mobility independence.    OBJECTIVE IMPAIRMENTS: Abnormal gait, decreased activity tolerance, decreased balance, decreased endurance, decreased mobility, difficulty walking, decreased ROM, decreased strength, impaired flexibility, impaired sensation, postural dysfunction, and pain  ACTIVITY LIMITATIONS: lifting, standing, stairs, transfers, bed mobility, bathing, hygiene/grooming, locomotion level, and caring for others  PARTICIPATION LIMITATIONS: cleaning, laundry, shopping, community activity, and yard work  PERSONAL FACTORS: Age, Fitness, Past/current experiences, and 1-2 comorbidities: Lumbar stenosis, 3 brain aneurysms, kidney transplant in 2022  are also affecting patient's functional outcome.    REHAB POTENTIAL: Good  CLINICAL DECISION MAKING: Evolving/moderate complexity  EVALUATION COMPLEXITY: Moderate  PLAN:  PT FREQUENCY: 2x/week  PT DURATION: 6 weeks  PLANNED INTERVENTIONS: 97164- PT Re-evaluation, 97110-Therapeutic exercises, 97530- Therapeutic activity, 97112- Neuromuscular re-education, 97535- Self Care, [id number]- Manual therapy, [phone]- Gait training, [phone]- Orthotic Fit/training, [phone]- Aquatic Therapy, Patient/Family education, Balance training, Stair training, Dry Needling, Joint mobilization, Vestibular training, and DME instructions  PLAN FOR NEXT SESSION: FGA, MCTSIB and and update goals. Establish HEP for improved spinal and R hip mobility, strength. Global endurance and functional strength (lifting)    Mikalyn Hermida E Kiaya [place], PT, DPT 11/19/2022, 11:51 AM

## 2022-11-22 ENCOUNTER — Encounter: Payer: Self-pay | Admitting: Neurological Surgery

## 2022-11-23 ENCOUNTER — Ambulatory Visit: Payer: Medicare HMO | Admitting: Physical Therapy

## 2022-11-23 VITALS — BP 97/56 | HR 76

## 2022-11-23 DIAGNOSIS — M6281 Muscle weakness (generalized): Secondary | ICD-10-CM

## 2022-11-23 DIAGNOSIS — R2689 Other abnormalities of gait and mobility: Secondary | ICD-10-CM

## 2022-11-23 DIAGNOSIS — R2681 Unsteadiness on feet: Secondary | ICD-10-CM | POA: Diagnosis not present

## 2022-11-23 DIAGNOSIS — R296 Repeated falls: Secondary | ICD-10-CM | POA: Diagnosis not present

## 2022-11-23 DIAGNOSIS — L609 Nail disorder, unspecified: Secondary | ICD-10-CM | POA: Diagnosis not present

## 2022-11-23 DIAGNOSIS — M5431 Sciatica, right side: Secondary | ICD-10-CM | POA: Diagnosis not present

## 2022-11-23 NOTE — Therapy (Signed)
OUTPATIENT PHYSICAL THERAPY NEURO TREATMENT   Patient Name: Asyria Larimer MRN: [id number] DOB:Dec 04, 1950, 72 y.o., female Today's Date: 11/23/2022   PCP: Merlene Laughter, MD REFERRING PROVIDER: Joaquim Nam, MD  END OF SESSION:  PT End of Session - 11/23/22 1106     Visit Number 2    Number of Visits 13    Date for PT Re-Evaluation 01/14/23    Authorization Type [employer]    PT Start Time 1105    PT Stop Time 1147    PT Time Calculation (min) 42 min    Activity Tolerance Patient tolerated treatment well    Behavior During Therapy [facility] for tasks assessed/performed             Past Medical History:  Diagnosis Date   Abdominal pain 06/06/2018   Acute renal failure (ARF) (HCC) 06/22/2018   Anemia in chronic kidney disease 08/25/2018   Anemia, unspecified 07/05/2018   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 states she has 2 aneurysms   Chronic low back pain 04/12/2017   Chronic pain syndrome 04/12/2017   Closed fracture of left distal radius    De Quervain's tenosynovitis 02/10/2018   Degeneration of lumbar intervertebral disc 03/01/2017   End stage renal failure on dialysis [facility])    M/W/F on [employer]   GERD (gastroesophageal reflux disease)    Goodpasture syndrome (HCC)    Headache 10/30/2014   Hyperlipidemia 04/17/2018   Hypertension    Hypothyroidism    Iron deficiency anemia, unspecified 07/12/2018   PONV (postoperative nausea and vomiting)    violent vomiting   Sacral back pain 05/25/2016   Scoliosis of lumbar spine 03/01/2017   Secondary hyperparathyroidism of renal origin (HCC) 08/22/2018   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   Thyroid disease    Trigger finger of left thumb 02/10/2018   Past Surgical History:  Procedure Laterality Date   ARTERY BIOPSY Right 10/31/2014   Procedure: BIOPSY TEMPORAL ARTERY-RIGHT;   Surgeon: Pryor Ochoa, MD;  Location: [facility] OR;  Service: Vascular;  Laterality: Right;   AV FISTULA PLACEMENT Left 10/11/2018   Procedure: left arm ARTERIOVENOUS (AV) FISTULA  creation;  Surgeon: Maeola Harman, MD;  Location: [facility] OR;  Service: Vascular;  Laterality: Left;   BILATERAL CARPAL TUNNEL RELEASE     BIOPSY  04/29/2019   Procedure: BIOPSY;  Surgeon: Kathi Der, MD;  Location: [facility];  Service: Gastroenterology;;   BIOPSY  11/05/2019   Procedure: BIOPSY;  Surgeon: Kathi Der, MD;  Location: [facility];  Service: Gastroenterology;;   BREAST SURGERY Left    lumpectomy   BUNIONECTOMY Right    CHOLECYSTECTOMY N/A 10/13/2016   Procedure: LAPAROSCOPIC CHOLECYSTECTOMY WITH INTRAOPERATIVE CHOLANGIOGRAM;  Surgeon: Manus Rudd, MD;  Location: [facility] OR;  Service: General;  Laterality: N/A;   COLONOSCOPY WITH PROPOFOL N/A 11/05/2019   Procedure: COLONOSCOPY WITH PROPOFOL;  Surgeon: Kathi Der, MD;  Location: [facility];  Service: Gastroenterology;  Laterality: N/A;   ESOPHAGOGASTRODUODENOSCOPY (EGD) WITH PROPOFOL N/A 04/29/2019   Procedure: ESOPHAGOGASTRODUODENOSCOPY (EGD) WITH PROPOFOL;  Surgeon: Kathi Der, MD;  Location: [facility];  Service: Gastroenterology;  Laterality: N/A;   EYE SURGERY     surgery for cross eye   FOOT FRACTURE SURGERY     OPEN REDUCTION INTERNAL FIXATION (ORIF) DISTAL RADIAL FRACTURE Left 03/29/2017   Procedure: OPEN REDUCTION INTERNAL FIXATION (ORIF)LEFT  DISTAL RADIAL FRACTURE;  Surgeon: Betha Loa, MD;  Location: [facility];  Service: Orthopedics;  Laterality: Left;   POLYPECTOMY  11/05/2019   Procedure: POLYPECTOMY;  Surgeon: Kathi Der, MD;  Location: [facility];  Service: Gastroenterology;;   THYROIDECTOMY     TONSILLECTOMY     WISDOM TOOTH EXTRACTION     Patient Active Problem List   Diagnosis Date Noted   Hypertensive crisis 10/23/2019   History of cerebral aneurysm 10/23/2019   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   Hypoxemia 02/21/2019   Pulmonary edema March 08, 2019   Encounter for immunization 09/26/2018   Anemia in chronic kidney disease 08/25/2018   End stage renal disease (HCC) 08/25/2018   Secondary hyperparathyroidism of renal origin (HCC) 08/22/2018   Referred otalgia of left ear 08/16/2018   Iron deficiency anemia, unspecified 07/12/2018   Shortness of breath 07/06/2018   Anemia, unspecified 07/05/2018   Anxiety 07/05/2018   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   Acute renal failure (ARF) (HCC) 06/22/2018   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   Abdominal pain 06/06/2018   Hydronephrosis due to obstruction of ureteral orifice 06/06/2018   Disease of thyroid gland 04/17/2018   Hyperlipidemia 04/17/2018   Hypertension 04/17/2018   De Quervain's tenosynovitis 02/10/2018   Trigger finger of left thumb 02/10/2018   Chronic low back pain 04/12/2017   Chronic pain syndrome 04/12/2017   Degeneration of lumbar intervertebral disc 03/01/2017   Scoliosis of lumbar spine 03/01/2017   Sacral back pain 05/25/2016   Eye pain 10/30/2014    ONSET DATE: 11/10/2022 (referral)   REFERRING DIAG:  Z61.096 (ICD-10-CM) - Spinal stenosis, lumbar region with neurogenic claudication    THERAPY DIAG:  Muscle weakness (generalized)  Other abnormalities of gait and mobility  Unsteadiness on feet  Rationale for Evaluation and Treatment: Rehabilitation  SUBJECTIVE:                                                                                                                                                                                             SUBJECTIVE STATEMENT: Pt reports feeling her sciatica today, rating it as a 5/10. Has been trying her march overs and feels pain in her low back, describes it as a pull. Has an infection of her toe, is gonna call the doctor today as it has been going on for 2 weeks.   Pt accompanied  by: self  PERTINENT HISTORY: hypothyroidism; HTN; HLD; Goodpasture syndrome; cerebral aneurysm; ESRD on TTS HD; and chronic pain, kidney replacement (12/14/2020).   PAIN:  Are you having pain? Yes: NPRS scale: 5/10 Pain location: RLE Pain description:  shooting pain Aggravating factors: "probably this workout"  Relieving factors: Heat/ice   PRECAUTIONS: Fall  RED FLAGS: None   WEIGHT BEARING RESTRICTIONS: No  FALLS: Has patient fallen in last 6 months? Yes. Number of falls %  LIVING ENVIRONMENT: Lives with:  her dog, Snickers  Lives in: House/apartment Stairs: Yes: External: 5 in carport w/bilateral rails and 2 in back w/no rails steps; see previous Has following equipment at home: Single point cane, Walker - 2 wheeled, shower chair, and walking sticks   PLOF: Independent  PATIENT GOALS: "To establish mobility so that I do not fall and I feel confident"   OBJECTIVE:  Note: Objective measures were completed at Evaluation unless otherwise noted.  DIAGNOSTIC FINDINGS: MRI of lumbar spine from 2022   Disc levels:   T12- L1: Unremarkable.   L1-L2: Ventral spondylitic spurring   L2-L3: Disc narrowing and bulging with mild right facet spurring.   L3-L4: Disc narrowing and bulging.  Mild facet spurring   L4-L5: Disc narrowing and bulging with left inferior foraminal protrusion. Asymmetric left facet spurring. Left foraminal stenosis is mild   L5-S1:Disc narrowing and bulging with endplate spurring eccentric to the left. Negative facets.   IMPRESSION: Generalized lumbar spine degeneration similar to 2017. No impingement to explain right leg symptoms.  COGNITION: Overall cognitive status: Within functional limits for tasks assessed   SENSATION: Shooting pain down her RLE that follows sciatic n. Distribution    POSTURE: rounded shoulders and forward head  LOWER EXTREMITY MMT:  Tested in seated position   MMT Right Eval Left Eval  Hip flexion 4- 4  Hip  extension    Hip abduction 5 5  Hip adduction 5 5  Hip internal rotation    Hip external rotation    Knee flexion 4+ 5  Knee extension 5 5  Ankle dorsiflexion 4 5  Ankle plantarflexion    Ankle inversion    Ankle eversion    (Blank rows = not tested)   GAIT: Gait pattern: step through pattern, decreased arm swing- Right, decreased arm swing- Left, decreased stride length, decreased hip/knee flexion- Right, decreased ankle dorsiflexion- Right, lateral hip instability, and wide BOS Distance walked: Various clinic distances  Assistive device utilized: None Level of assistance: SBA Comments: Pt demonstrates waddle-like gait pattern w/very wide BOS. Noted uncompensated trendelenburg on RLE as well.   VITALS Vitals:   11/23/22 1140  BP: (!) 97/56  Pulse: 76      TODAY'S TREATMENT:      Ther Ex  SciFit multi-peaks level 6 for 8 minutes using BUE/BLEs for neural priming for reciprocal movement, dynamic cardiovascular warmup and global strength.  Established HEP for improved functional mobility and pain modulation:  Seated figure 4 stretch, 2x53min per side. Noted good mobility bilaterally but pt reported feeling more intense stretch on RLE Supine figure 4 stretch w/contralateral sciatic nerve glide, x90s per side. Significant stretch noted on RLE when performing nerve glide  Supine iron crosses, x6 per side. Pt unable to perform w/straight leg due to pain, so advised her to bend knee which was much more comfortable  Supine butterfly stretch w/ppt, x10 reps  Seated hamstring stretch, x90s per side. Pt more symptomatic on RLE > LLE.  Assessed vitals (see above) as pt reported feeling dizzy coming to sit from supine. Pt's BP low and will continue to monitor in sessions. Pt reports she did not drink much water today but will hydrate at home.       PATIENT EDUCATION: Education details:  initial HEP Person educated: Patient Education method: Explanation, Demonstration, and  Handouts Education comprehension: verbalized understanding and returned demonstration  HOME EXERCISE PROGRAM: Verbally added seated march overs on eval   Access Code: [email or web address] URL: [email or web address] Date: 11/23/2022 Prepared by: Alethia Berthold Toshiyuki Fredell  Exercises - Seated Piriformis Stretch with Trunk Bend  - 1 x daily - 7 x weekly - 3 sets - 30-45 second  hold - Supine Figure 4 Piriformis Stretch  - 1 x daily - 7 x weekly - 3 sets - 45-60 second hold - Supine Straight Leg Lumbar Rotation Stretch  - 1 x daily - 7 x weekly - 1 sets - 5-10 reps - 10-20 second hold - Seated Hamstring Stretch  - 1 x daily - 7 x weekly - 3 sets - 10 reps - Supine Butterfly Groin Stretch  - 1 x daily - 7 x weekly - 3 sets - 10 reps  GOALS: Goals reviewed with patient? Yes  SHORT TERM GOALS: Target date: 12/17/2022   Pt will be independent with initial  HEP for improved strength, balance, transfers and gait.  Baseline: Goal status: INITIAL  2.  FGA to be assessed and STG/LTG updated  Baseline:  Goal status: INITIAL  3.  MCTSIB to be assessed and STG/LTG updated  Baseline:  Goal status: INITIAL  4.  to be assessed and goal updated  Baseline:  Goal status: INITIAL   LONG TERM GOALS: Target date: 12/31/2022    Pt will be independent with final HEP for improved strength, balance, transfers and gait.  Baseline:  Goal status: INITIAL  2.  FGA goal  Baseline:  Goal status: INITIAL  3.  MCTSIB goal  Baseline:  Goal status: INITIAL  4.  goal  Baseline:  Goal status: INITIAL   ASSESSMENT:  CLINICAL IMPRESSION: Emphasis of skilled PT session on establishing initial HEP for improved functional mobility and reduced pain levels. Pt's BP low at end of session and pt symptomatic but reported she had not drank much today, so will continue to monitor. Pt reported significant cramping in hamstrings this date that occur frequently, so advised pt to increase salt and fluid  intake and pt in agreement. Continue POC.    OBJECTIVE IMPAIRMENTS: Abnormal gait, decreased activity tolerance, decreased balance, decreased endurance, decreased mobility, difficulty walking, decreased ROM, decreased strength, impaired flexibility, impaired sensation, postural dysfunction, and pain  ACTIVITY LIMITATIONS: lifting, standing, stairs, transfers, bed mobility, bathing, hygiene/grooming, locomotion level, and caring for others  PARTICIPATION LIMITATIONS: cleaning, laundry, shopping, community activity, and yard work  PERSONAL FACTORS: Age, Fitness, Past/current experiences, and 1-2 comorbidities: Lumbar stenosis, 3 brain aneurysms, kidney transplant in 2022  are also affecting patient's functional outcome.   REHAB POTENTIAL: Good  CLINICAL DECISION MAKING: Evolving/moderate complexity  EVALUATION COMPLEXITY: Moderate  PLAN:  PT FREQUENCY: 2x/week  PT DURATION: 6 weeks  PLANNED INTERVENTIONS: 97164- PT Re-evaluation, 97110-Therapeutic exercises, 97530- Therapeutic activity, 97112- Neuromuscular re-education, 97535- Self Care, 20254- Manual therapy, [phone]- Gait training, [phone]- Orthotic Fit/training, [phone]- Aquatic Therapy, Patient/Family education, Balance training, Stair training, Dry Needling, Joint mobilization, Vestibular training, and DME instructions  PLAN FOR NEXT SESSION: Check BP. FGA, MCTSIB and and update goals. Add to HEP for improved spinal and R hip mobility, strength. Global endurance and functional strength (lifting)    Francella Barnett E Carlon Chaloux, PT, DPT 11/23/2022, 11:48 AM

## 2022-11-25 ENCOUNTER — Ambulatory Visit: Payer: Medicare HMO | Admitting: Physical Therapy

## 2022-11-25 VITALS — BP 133/69 | HR 64

## 2022-11-25 DIAGNOSIS — M6281 Muscle weakness (generalized): Secondary | ICD-10-CM

## 2022-11-25 DIAGNOSIS — R2689 Other abnormalities of gait and mobility: Secondary | ICD-10-CM

## 2022-11-25 DIAGNOSIS — M5431 Sciatica, right side: Secondary | ICD-10-CM | POA: Diagnosis not present

## 2022-11-25 DIAGNOSIS — R2681 Unsteadiness on feet: Secondary | ICD-10-CM

## 2022-11-25 DIAGNOSIS — R296 Repeated falls: Secondary | ICD-10-CM | POA: Diagnosis not present

## 2022-11-25 NOTE — Therapy (Addendum)
OUTPATIENT PHYSICAL THERAPY NEURO TREATMENT   Patient Name: Cristina Wilson MRN: [id number] DOB:1950/08/17, 72 y.o., female Today's Date: 11/25/2022   PCP: Merlene Laughter, MD REFERRING PROVIDER: Joaquim Nam, MD  END OF SESSION:  PT End of Session - 11/25/22 0932     Visit Number 3    Number of Visits 13    Date for PT Re-Evaluation 01/14/23    Authorization Type [employer]    PT Start Time [phone]    PT Stop Time 1014    PT Time Calculation (min) 43 min    Equipment Utilized During Treatment Gait belt    Activity Tolerance Patient tolerated treatment well    Behavior During Therapy WFL for tasks assessed/performed             Past Medical History:  Diagnosis Date   Abdominal pain 06/06/2018   Acute renal failure (ARF) (HCC) 06/22/2018   Anemia in chronic kidney disease 08/25/2018   Anemia, unspecified 07/05/2018   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 states she has 2 aneurysms   Chronic low back pain 04/12/2017   Chronic pain syndrome 04/12/2017   Closed fracture of left distal radius    De Quervain's tenosynovitis 02/10/2018   Degeneration of lumbar intervertebral disc 03/01/2017   End stage renal failure on dialysis [facility])    M/W/F on [employer]   GERD (gastroesophageal reflux disease)    Goodpasture syndrome (HCC)    Headache 10/30/2014   Hyperlipidemia 04/17/2018   Hypertension    Hypothyroidism    Iron deficiency anemia, unspecified 07/12/2018   PONV (postoperative nausea and vomiting)    violent vomiting   Sacral back pain 05/25/2016   Scoliosis of lumbar spine 03/01/2017   Secondary hyperparathyroidism of renal origin (HCC) 08/22/2018   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   Thyroid disease    Trigger finger of left thumb 02/10/2018   Past Surgical History:  Procedure Laterality Date   ARTERY BIOPSY Right 10/31/2014    Procedure: BIOPSY TEMPORAL ARTERY-RIGHT;  Surgeon: Pryor Ochoa, MD;  Location: [facility] OR;  Service: Vascular;  Laterality: Right;   AV FISTULA PLACEMENT Left 10/11/2018   Procedure: left arm ARTERIOVENOUS (AV) FISTULA  creation;  Surgeon: Maeola Harman, MD;  Location: [facility] OR;  Service: Vascular;  Laterality: Left;   BILATERAL CARPAL TUNNEL RELEASE     BIOPSY  04/29/2019   Procedure: BIOPSY;  Surgeon: Kathi Der, MD;  Location: [facility]COPY;  Service: Gastroenterology;;   BIOPSY  11/05/2019   Procedure: BIOPSY;  Surgeon: Kathi Der, MD;  Location: [facility];  Service: Gastroenterology;;   BREAST SURGERY Left    lumpectomy   BUNIONECTOMY Right    CHOLECYSTECTOMY N/A 10/13/2016   Procedure: LAPAROSCOPIC CHOLECYSTECTOMY WITH INTRAOPERATIVE CHOLANGIOGRAM;  Surgeon: Manus Rudd, MD;  Location: [facility] OR;  Service: General;  Laterality: N/A;   COLONOSCOPY WITH PROPOFOL N/A 11/05/2019   Procedure: COLONOSCOPY WITH PROPOFOL;  Surgeon: Kathi Der, MD;  Location: [facility];  Service: Gastroenterology;  Laterality: N/A;   ESOPHAGOGASTRODUODENOSCOPY (EGD) WITH PROPOFOL N/A 04/29/2019   Procedure: ESOPHAGOGASTRODUODENOSCOPY (EGD) WITH PROPOFOL;  Surgeon: Kathi Der, MD;  Location: [facility];  Service: Gastroenterology;  Laterality: N/A;   EYE SURGERY     surgery for cross eye   FOOT FRACTURE SURGERY     OPEN REDUCTION INTERNAL FIXATION (ORIF) DISTAL RADIAL FRACTURE Left 03/29/2017  Procedure: OPEN REDUCTION INTERNAL FIXATION (ORIF)LEFT  DISTAL RADIAL FRACTURE;  Surgeon: Betha Loa, MD;  Location: [facility];  Service: Orthopedics;  Laterality: Left;   POLYPECTOMY  11/05/2019   Procedure: POLYPECTOMY;  Surgeon: Kathi Der, MD;  Location: [facility];  Service: Gastroenterology;;   THYROIDECTOMY     TONSILLECTOMY     WISDOM TOOTH EXTRACTION     Patient Active Problem List   Diagnosis Date Noted   Hypertensive crisis 10/23/2019    History of cerebral aneurysm 10/23/2019   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   Hypoxemia 02/21/2019   Pulmonary edema 03/16/19   Encounter for immunization 09/26/2018   Anemia in chronic kidney disease 08/25/2018   End stage renal disease (HCC) 08/25/2018   Secondary hyperparathyroidism of renal origin (HCC) 08/22/2018   Referred otalgia of left ear 08/16/2018   Iron deficiency anemia, unspecified 07/12/2018   Shortness of breath 07/06/2018   Anemia, unspecified 07/05/2018   Anxiety 07/05/2018   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   Acute renal failure (ARF) (HCC) 06/22/2018   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   Abdominal pain 06/06/2018   Hydronephrosis due to obstruction of ureteral orifice 06/06/2018   Disease of thyroid gland 04/17/2018   Hyperlipidemia 04/17/2018   Hypertension 04/17/2018   De Quervain's tenosynovitis 02/10/2018   Trigger finger of left thumb 02/10/2018   Chronic low back pain 04/12/2017   Chronic pain syndrome 04/12/2017   Degeneration of lumbar intervertebral disc 03/01/2017   Scoliosis of lumbar spine 03/01/2017   Sacral back pain 05/25/2016   Eye pain 10/30/2014    ONSET DATE: 11/10/2022 (referral)   REFERRING DIAG:  M57.846 (ICD-10-CM) - Spinal stenosis, lumbar region with neurogenic claudication    THERAPY DIAG:  Muscle weakness (generalized)  Other abnormalities of gait and mobility  Unsteadiness on feet  Rationale for Evaluation and Treatment: Rehabilitation  SUBJECTIVE:                                                                                                                                                                                             SUBJECTIVE STATEMENT: Pt reports doing well. Pain is about the same. No falls. Got an antibiotic for her foot.   Pt accompanied by: self  PERTINENT HISTORY: hypothyroidism; HTN; HLD; Goodpasture syndrome; cerebral aneurysm; ESRD on  TTS HD; and chronic pain, kidney replacement (12/14/2020).   PAIN:  Are you having pain? Yes: NPRS scale: 5/10 Pain location: RLE Pain description: shooting pain Aggravating factors: "probably this workout"  Relieving factors: Heat/ice   PRECAUTIONS: Fall  RED FLAGS: None   WEIGHT BEARING RESTRICTIONS: No  FALLS: Has patient fallen in last 6 months? Yes. Number of falls %  LIVING ENVIRONMENT: Lives with:  her dog, Snickers  Lives in: House/apartment Stairs: Yes: External: 5 in carport w/bilateral rails and 2 in back w/no rails steps; see previous Has following equipment at home: Single point cane, Walker - 2 wheeled, shower chair, and walking sticks   PLOF: Independent  PATIENT GOALS: "To establish mobility so that I do not fall and I feel confident"   OBJECTIVE:  Note: Objective measures were completed at Evaluation unless otherwise noted.  DIAGNOSTIC FINDINGS: MRI of lumbar spine from 2022   Disc levels:   T12- L1: Unremarkable.   L1-L2: Ventral spondylitic spurring   L2-L3: Disc narrowing and bulging with mild right facet spurring.   L3-L4: Disc narrowing and bulging.  Mild facet spurring   L4-L5: Disc narrowing and bulging with left inferior foraminal protrusion. Asymmetric left facet spurring. Left foraminal stenosis is mild   L5-S1:Disc narrowing and bulging with endplate spurring eccentric to the left. Negative facets.   IMPRESSION: Generalized lumbar spine degeneration similar to 2017. No impingement to explain right leg symptoms.  COGNITION: Overall cognitive status: Within functional limits for tasks assessed   SENSATION: Shooting pain down her RLE that follows sciatic n. Distribution    POSTURE: rounded shoulders and forward head  LOWER EXTREMITY MMT:  Tested in seated position   MMT Right Eval Left Eval  Hip flexion 4- 4  Hip extension    Hip abduction 5 5  Hip adduction 5 5  Hip internal rotation    Hip external rotation    Knee  flexion 4+ 5  Knee extension 5 5  Ankle dorsiflexion 4 5  Ankle plantarflexion    Ankle inversion    Ankle eversion    (Blank rows = not tested)   GAIT: Gait pattern: step through pattern, decreased arm swing- Right, decreased arm swing- Left, decreased stride length, decreased hip/knee flexion- Right, decreased ankle dorsiflexion- Right, lateral hip instability, and wide BOS Distance walked: Various clinic distances  Assistive device utilized: None Level of assistance: SBA Comments: Pt demonstrates waddle-like gait pattern w/very wide BOS. Noted uncompensated trendelenburg on RLE as well.   VITALS Vitals:   11/25/22 0937 11/25/22 1009  BP: (!) 141/74 133/69  Pulse: 64 64     TODAY'S TREATMENT:      Ther Act  Assessed vitals (see above) and WNL for therapy    [facility] PT Assessment - 11/25/22 0939       Balance   Balance Assessed Yes      Functional Gait  Assessment   Gait assessed  Yes    Gait Level Surface Walks 20 ft in less than 7 sec but greater than 5.5 sec, uses assistive device, slower speed, mild gait deviations, or deviates 6-10 in outside of the 12 in walkway width.   6.78s   Change in Gait Speed Able to change speed, demonstrates mild gait deviations, deviates 6-10 in outside of the 12 in walkway width, or no gait deviations, unable to achieve a major change in velocity, or uses a change in velocity, or uses an assistive device.    Gait with Horizontal Head Turns Performs head turns with moderate changes in gait velocity, slows down, deviates 10-15 in outside 12 in walkway width but recovers, can continue to walk.    Gait with Vertical Head Turns Performs task with slight change in gait velocity (eg, minor disruption to smooth gait path), deviates 6 - 10  in outside 12 in walkway width or uses assistive device    Gait and Pivot Turn Pivot turns safely within 3 sec and stops quickly with no loss of balance.    Step Over Obstacle Cannot perform without assistance.     Gait with Narrow Base of Support Ambulates less than 4 steps heel to toe or cannot perform without assistance.    Gait with Eyes Closed Walks 20 ft, slow speed, abnormal gait pattern, evidence for imbalance, deviates 10-15 in outside 12 in walkway width. Requires more than 9 sec to ambulate 20 ft.   22.57s   Ambulating Backwards Walks 20 ft, uses assistive device, slower speed, mild gait deviations, deviates 6-10 in outside 12 in walkway width.    Steps Two feet to a stair, must use rail.    Total Score 14    FGA comment: High fall risk            MCTSIB: Condition 1: Avg of 3 trials: 30 sec, Condition 2: Avg of 3 trials: 30 sec, Condition 3: Avg of 3 trials: 30 sec, Condition 4: Avg of 3 trials: 30 sec (moderate sway, A>P), and Total Score: 120/120   Ther Ex  In // bars for improved quad and hip strength, alt eccentric heel taps from 4" step in fwd and lateral directions w/BUE support, x8 per side. Noted decreased eccentric control of RLE in fwd direction and LLE in lateral direciton. Pt w/significant hip drop of LLE when tapping fwd w/RLE as well. Added to HEP (see bolded below) as pt has an aerobic step at home.       PATIENT EDUCATION: Education details: FGA results, additions to HEP, proper stair sequence (ascend w/LLE, descend w/RLE) Person educated: Patient Education method: Explanation, Demonstration, and Handouts Education comprehension: verbalized understanding and returned demonstration  HOME EXERCISE PROGRAM: Verbally added seated march overs on eval   Access Code: [email or web address] URL: [email or web address] Date: 11/23/2022 Prepared by: Alethia Berthold Radonna Bracher  Exercises - Seated Piriformis Stretch with Trunk Bend  - 1 x daily - 7 x weekly - 3 sets - 30-45 second  hold - Supine Figure 4 Piriformis Stretch  - 1 x daily - 7 x weekly - 3 sets - 45-60 second hold - Supine Straight Leg Lumbar Rotation Stretch  - 1 x daily - 7 x weekly - 1 sets - 5-10 reps - 10-20 second  hold - Seated Hamstring Stretch  - 1 x daily - 7 x weekly - 3 sets - 10 reps - Supine Butterfly Groin Stretch  - 1 x daily - 7 x weekly - 3 sets - 10 reps - Forward Step Down with Heel Tap and Rail Support  - 1 x daily - 7 x weekly - 3 sets - 10 reps - Side Step Down with Counter Support  - 1 x daily - 7 x weekly - 3 sets - 10 reps  GOALS: Goals reviewed with patient? Yes  SHORT TERM GOALS: Target date: 12/17/2022   Pt will be independent with initial  HEP for improved strength, balance, transfers and gait.  Baseline: Goal status: INITIAL  2.  Pt will improve FGA to 18/30 for decreased fall risk   Baseline: 14/30 Goal status: INITIAL  3.  MCTSIB to be assessed and STG/LTG updated  Baseline:  Goal status: DC due to high baseline score   4.  to be assessed and goal updated  Baseline:  Goal status: INITIAL   LONG TERM GOALS: Target date: 12/31/2022  Pt will be independent with final HEP for improved strength, balance, transfers and gait.  Baseline:  Goal status: INITIAL  2.  Pt will improve FGA to 22/30 for decreased fall risk   Baseline: 14/30 Goal status: REVISED   3.  MCTSIB goal  Baseline:  Goal status: DC due to high baseline score   4.  goal  Baseline:  Goal status: INITIAL   ASSESSMENT:  CLINICAL IMPRESSION: Emphasis of skilled PT session on assessing balance via GA and MCTSIB and adding to HEP for improved hip and quad strength. Pt scored a 14/30 on FGA, indicative of high fall risk. Pt most challenged by horizontal head turns, narrow BOS  and stepping over obstacles. Pt scored a 120/120 on MCTSIB, indicating fully functional balance systems. Noted pt tends to catch her RLE on the ground and lose balance anteriorly, w/very poor righting reactions and anticipatory balance strategies. Continue POC.    OBJECTIVE IMPAIRMENTS: Abnormal gait, decreased activity tolerance, decreased balance, decreased endurance, decreased mobility, difficulty  walking, decreased ROM, decreased strength, impaired flexibility, impaired sensation, postural dysfunction, and pain  ACTIVITY LIMITATIONS: lifting, standing, stairs, transfers, bed mobility, bathing, hygiene/grooming, locomotion level, and caring for others  PARTICIPATION LIMITATIONS: cleaning, laundry, shopping, community activity, and yard work  PERSONAL FACTORS: Age, Fitness, Past/current experiences, and 1-2 comorbidities: Lumbar stenosis, 3 brain aneurysms, kidney transplant in 2022  are also affecting patient's functional outcome.   REHAB POTENTIAL: Good  CLINICAL DECISION MAKING: Evolving/moderate complexity  EVALUATION COMPLEXITY: Moderate  PLAN:  PT FREQUENCY: 2x/week  PT DURATION: 6 weeks  PLANNED INTERVENTIONS: 97164- PT Re-evaluation, 97110-Therapeutic exercises, 97530- Therapeutic activity, 97112- Neuromuscular re-education, 97535- Self Care, 54098- Manual therapy, [phone]- Gait training, [phone]- Orthotic Fit/training, [phone]- Aquatic Therapy, Patient/Family education, Balance training, Stair training, Dry Needling, Joint mobilization, Vestibular training, and DME instructions  PLAN FOR NEXT SESSION: Check BP. and update goal. Add to HEP for improved spinal and R hip mobility, strength. Global endurance and functional strength (lifting), eccentric control, head turns, anticipatory balance   Jill Alexanders Leotha Voeltz, PT, DPT 11/25/2022, 10:15 AM

## 2022-11-26 DIAGNOSIS — E7800 Pure hypercholesterolemia, unspecified: Secondary | ICD-10-CM | POA: Diagnosis not present

## 2022-11-26 DIAGNOSIS — D849 Immunodeficiency, unspecified: Secondary | ICD-10-CM | POA: Diagnosis not present

## 2022-11-26 DIAGNOSIS — E213 Hyperparathyroidism, unspecified: Secondary | ICD-10-CM | POA: Diagnosis not present

## 2022-11-26 DIAGNOSIS — Z940 Kidney transplant status: Secondary | ICD-10-CM | POA: Diagnosis not present

## 2022-11-29 ENCOUNTER — Ambulatory Visit: Payer: Medicare HMO | Admitting: Physical Therapy

## 2022-11-29 VITALS — BP 100/57 | HR 59

## 2022-11-29 DIAGNOSIS — R296 Repeated falls: Secondary | ICD-10-CM

## 2022-11-29 DIAGNOSIS — R2681 Unsteadiness on feet: Secondary | ICD-10-CM

## 2022-11-29 DIAGNOSIS — M6281 Muscle weakness (generalized): Secondary | ICD-10-CM

## 2022-11-29 DIAGNOSIS — R2689 Other abnormalities of gait and mobility: Secondary | ICD-10-CM | POA: Diagnosis not present

## 2022-11-29 DIAGNOSIS — M5431 Sciatica, right side: Secondary | ICD-10-CM | POA: Diagnosis not present

## 2022-11-29 NOTE — Therapy (Signed)
OUTPATIENT PHYSICAL THERAPY NEURO TREATMENT   Patient Name: Cristina Wilson MRN: [id number] DOB:1951-01-04, 72 y.o., female Today's Date: 11/29/2022   PCP: Merlene Laughter, MD REFERRING PROVIDER: Joaquim Nam, MD  END OF SESSION:  PT End of Session - 11/29/22 1319     Visit Number 4    Number of Visits 13    Date for PT Re-Evaluation 01/14/23    Authorization Type [employer]    PT Start Time 1318    PT Stop Time 1400    PT Time Calculation (min) 42 min    Equipment Utilized During Treatment Gait belt    Activity Tolerance Patient tolerated treatment well    Behavior During Therapy WFL for tasks assessed/performed             Past Medical History:  Diagnosis Date   Abdominal pain 06/06/2018   Acute renal failure (ARF) (HCC) 06/22/2018   Anemia in chronic kidney disease 08/25/2018   Anemia, unspecified 07/05/2018   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 states she has 2 aneurysms   Chronic low back pain 04/12/2017   Chronic pain syndrome 04/12/2017   Closed fracture of left distal radius    De Quervain's tenosynovitis 02/10/2018   Degeneration of lumbar intervertebral disc 03/01/2017   End stage renal failure on dialysis [facility])    M/W/F on [employer]   GERD (gastroesophageal reflux disease)    Goodpasture syndrome (HCC)    Headache 10/30/2014   Hyperlipidemia 04/17/2018   Hypertension    Hypothyroidism    Iron deficiency anemia, unspecified 07/12/2018   PONV (postoperative nausea and vomiting)    violent vomiting   Sacral back pain 05/25/2016   Scoliosis of lumbar spine 03/01/2017   Secondary hyperparathyroidism of renal origin (HCC) 08/22/2018   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   Thyroid disease    Trigger finger of left thumb 02/10/2018   Past Surgical History:  Procedure Laterality Date   ARTERY BIOPSY Right 10/31/2014    Procedure: BIOPSY TEMPORAL ARTERY-RIGHT;  Surgeon: Pryor Ochoa, MD;  Location: [facility] OR;  Service: Vascular;  Laterality: Right;   AV FISTULA PLACEMENT Left 10/11/2018   Procedure: left arm ARTERIOVENOUS (AV) FISTULA  creation;  Surgeon: Maeola Harman, MD;  Location: [facility] OR;  Service: Vascular;  Laterality: Left;   BILATERAL CARPAL TUNNEL RELEASE     BIOPSY  04/29/2019   Procedure: BIOPSY;  Surgeon: Kathi Der, MD;  Location: [facility];  Service: Gastroenterology;;   BIOPSY  11/05/2019   Procedure: BIOPSY;  Surgeon: Kathi Der, MD;  Location: [facility];  Service: Gastroenterology;;   BREAST SURGERY Left    lumpectomy   BUNIONECTOMY Right    CHOLECYSTECTOMY N/A 10/13/2016   Procedure: LAPAROSCOPIC CHOLECYSTECTOMY WITH INTRAOPERATIVE CHOLANGIOGRAM;  Surgeon: Manus Rudd, MD;  Location: [facility] OR;  Service: General;  Laterality: N/A;   COLONOSCOPY WITH PROPOFOL N/A 11/05/2019   Procedure: COLONOSCOPY WITH PROPOFOL;  Surgeon: Kathi Der, MD;  Location: [facility];  Service: Gastroenterology;  Laterality: N/A;   ESOPHAGOGASTRODUODENOSCOPY (EGD) WITH PROPOFOL N/A 04/29/2019   Procedure: ESOPHAGOGASTRODUODENOSCOPY (EGD) WITH PROPOFOL;  Surgeon: Kathi Der, MD;  Location: [facility];  Service: Gastroenterology;  Laterality: N/A;   EYE SURGERY     surgery for cross eye   FOOT FRACTURE SURGERY     OPEN REDUCTION INTERNAL FIXATION (ORIF) DISTAL RADIAL FRACTURE Left 03/29/2017  Procedure: OPEN REDUCTION INTERNAL FIXATION (ORIF)LEFT  DISTAL RADIAL FRACTURE;  Surgeon: Betha Loa, MD;  Location: [facility];  Service: Orthopedics;  Laterality: Left;   POLYPECTOMY  11/05/2019   Procedure: POLYPECTOMY;  Surgeon: Kathi Der, MD;  Location: [facility];  Service: Gastroenterology;;   THYROIDECTOMY     TONSILLECTOMY     WISDOM TOOTH EXTRACTION     Patient Active Problem List   Diagnosis Date Noted   Hypertensive crisis 10/23/2019    History of cerebral aneurysm 10/23/2019   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   Hypoxemia 02/21/2019   Pulmonary edema March 11, 2019   Encounter for immunization 09/26/2018   Anemia in chronic kidney disease 08/25/2018   End stage renal disease (HCC) 08/25/2018   Secondary hyperparathyroidism of renal origin (HCC) 08/22/2018   Referred otalgia of left ear 08/16/2018   Iron deficiency anemia, unspecified 07/12/2018   Shortness of breath 07/06/2018   Anemia, unspecified 07/05/2018   Anxiety 07/05/2018   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   Acute renal failure (ARF) (HCC) 06/22/2018   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   Abdominal pain 06/06/2018   Hydronephrosis due to obstruction of ureteral orifice 06/06/2018   Disease of thyroid gland 04/17/2018   Hyperlipidemia 04/17/2018   Hypertension 04/17/2018   De Quervain's tenosynovitis 02/10/2018   Trigger finger of left thumb 02/10/2018   Chronic low back pain 04/12/2017   Chronic pain syndrome 04/12/2017   Degeneration of lumbar intervertebral disc 03/01/2017   Scoliosis of lumbar spine 03/01/2017   Sacral back pain 05/25/2016   Eye pain 10/30/2014    ONSET DATE: 11/10/2022 (referral)   REFERRING DIAG:  W29.562 (ICD-10-CM) - Spinal stenosis, lumbar region with neurogenic claudication    THERAPY DIAG:  Muscle weakness (generalized)  Other abnormalities of gait and mobility  Unsteadiness on feet  Repeated falls  Rationale for Evaluation and Treatment: Rehabilitation  SUBJECTIVE:                                                                                                                                                                                             SUBJECTIVE STATEMENT: Pt reports doing well. Rode her bike once this weekend. Got her blood results back and will have some medication changes. No falls.   Pt accompanied by: self  PERTINENT HISTORY: hypothyroidism;  HTN; HLD; Goodpasture syndrome; cerebral aneurysm; ESRD on TTS HD; and chronic pain, kidney replacement (12/14/2020).   PAIN:  Are you having pain? Yes: NPRS scale: 5-6/10 Pain location: RLE Pain description: shooting pain Aggravating factors: "probably this workout"  Relieving factors: Heat/ice   PRECAUTIONS: Fall  RED  FLAGS: None   WEIGHT BEARING RESTRICTIONS: No  FALLS: Has patient fallen in last 6 months? Yes. Number of falls %  LIVING ENVIRONMENT: Lives with:  her dog, Snickers  Lives in: House/apartment Stairs: Yes: External: 5 in carport w/bilateral rails and 2 in back w/no rails steps; see previous Has following equipment at home: Single point cane, Walker - 2 wheeled, shower chair, and walking sticks   PLOF: Independent  PATIENT GOALS: "To establish mobility so that I do not fall and I feel confident"   OBJECTIVE:  Note: Objective measures were completed at Evaluation unless otherwise noted.  DIAGNOSTIC FINDINGS: MRI of lumbar spine from 2022   Disc levels:   T12- L1: Unremarkable.   L1-L2: Ventral spondylitic spurring   L2-L3: Disc narrowing and bulging with mild right facet spurring.   L3-L4: Disc narrowing and bulging.  Mild facet spurring   L4-L5: Disc narrowing and bulging with left inferior foraminal protrusion. Asymmetric left facet spurring. Left foraminal stenosis is mild   L5-S1:Disc narrowing and bulging with endplate spurring eccentric to the left. Negative facets.   IMPRESSION: Generalized lumbar spine degeneration similar to 2017. No impingement to explain right leg symptoms.  COGNITION: Overall cognitive status: Within functional limits for tasks assessed   SENSATION: Shooting pain down her RLE that follows sciatic n. Distribution    POSTURE: rounded shoulders and forward head  LOWER EXTREMITY MMT:  Tested in seated position   MMT Right Eval Left Eval  Hip flexion 4- 4  Hip extension    Hip abduction 5 5  Hip adduction 5 5   Hip internal rotation    Hip external rotation    Knee flexion 4+ 5  Knee extension 5 5  Ankle dorsiflexion 4 5  Ankle plantarflexion    Ankle inversion    Ankle eversion    (Blank rows = not tested)   GAIT: Gait pattern: step through pattern, decreased arm swing- Right, decreased arm swing- Left, decreased stride length, decreased hip/knee flexion- Right, decreased ankle dorsiflexion- Right, lateral hip instability, and wide BOS Distance walked: Various clinic distances  Assistive device utilized: None Level of assistance: SBA Comments: Pt demonstrates waddle-like gait pattern w/very wide BOS. Noted uncompensated trendelenburg on RLE as well.   VITALS Vitals:   11/29/22 1327  BP: (!) 100/57  Pulse: (!) 59      TODAY'S TREATMENT:      Ther Act  Assessed vitals (see above) and BP low but WNL for therapy    Gait pattern: step through pattern, decreased stride length, decreased hip/knee flexion- Right, Left foot flat, lateral hip instability, lateral lean- Left, decreased trunk rotation, and narrow BOS Distance walked: 115' loop completed 13x + 19' = 1583'  Assistive device utilized: None Level of assistance: SBA Comments: Noted uncompensated Trendelenburg on R side, resulting in poor eccentric control of IC on LLE w/significant L truncal lean. Pt alternated "slow" and "fast" laps during final 2 minutes due to fatigue RPE of 8-10/10 following activity.    Ther Ex  On mat, quadruped bird dogs, 2x2 per side w/min-mod A for stabilization at pelvis for improved posterior chain strength and functional hip stability. Increased difficulty stabilizing on LLE > RLE. Pt very challenged by this so did not add to HEP this date.  At ballet bar, standing glute kickbacks w/yellow resistance band, x12 per side w/BUE support. Mod multimodal cues to facilitate glute med rather than max, but due to glute weakness, pt unable to do so. Pt reported  tightness in low back w/activity.      PATIENT EDUCATION: Education details: continue HEP Person educated: Patient Education method: Explanation, Demonstration, and Handouts Education comprehension: verbalized understanding and returned demonstration  HOME EXERCISE PROGRAM: Verbally added seated march overs on eval   Access Code: [email or web address] URL: [email or web address] Date: 11/23/2022 Prepared by: Alethia Berthold Crandall Harvel  Exercises - Seated Piriformis Stretch with Trunk Bend  - 1 x daily - 7 x weekly - 3 sets - 30-45 second  hold - Supine Figure 4 Piriformis Stretch  - 1 x daily - 7 x weekly - 3 sets - 45-60 second hold - Supine Straight Leg Lumbar Rotation Stretch  - 1 x daily - 7 x weekly - 1 sets - 5-10 reps - 10-20 second hold - Seated Hamstring Stretch  - 1 x daily - 7 x weekly - 3 sets - 10 reps - Supine Butterfly Groin Stretch  - 1 x daily - 7 x weekly - 3 sets - 10 reps - Forward Step Down with Heel Tap and Rail Support  - 1 x daily - 7 x weekly - 3 sets - 10 reps - Side Step Down with Counter Support  - 1 x daily - 7 x weekly - 3 sets - 10 reps  GOALS: Goals reviewed with patient? Yes  SHORT TERM GOALS: Target date: 12/17/2022   Pt will be independent with initial  HEP for improved strength, balance, transfers and gait.  Baseline: Goal status: INITIAL  2.  Pt will improve FGA to 18/30 for decreased fall risk   Baseline: 14/30 Goal status: INITIAL  3.  MCTSIB to be assessed and STG/LTG updated  Baseline:  Goal status: DC due to high baseline score   4.  Pt will ambulate greater than or equal to 1625' feet on with RPE of <7/10 for improved cardiovascular endurance and BLE strength.   Baseline: 1583' w/RPE of 8-10/10, no AD (11/25) Goal status: REVISED    LONG TERM GOALS: Target date: 12/31/2022    Pt will be independent with final HEP for improved strength, balance, transfers and gait.  Baseline:  Goal status: INITIAL  2.  Pt will improve FGA to 22/30 for decreased fall risk    Baseline: 14/30 Goal status: REVISED   3.  MCTSIB goal  Baseline:  Goal status: DC due to high baseline score   4.  Pt will ambulate greater than or equal to 1700 feet on with <5/10 RPE for improved cardiovascular endurance and BLE strength.   Baseline: 1583' no AD RPE of 8-10/10 Goal status: REVISED    ASSESSMENT:  CLINICAL IMPRESSION: Emphasis of skilled PT session on assessing endurance and gait kinematics via , posterior chain strength and glute strength. Pt ambulated 1583' on w/RPE of 8-10/10, which is on the lower end of normal range for pt's age. Noted significant left truncal lean w/activity as well as uncompensated trendelenburg on R side, resulting in poor eccentric control of LLE in IC. Pt very challenged by hip strengthening activity and requires mod multimodal cues to facilitate contraction of appropriate musculature. Continue POC.    OBJECTIVE IMPAIRMENTS: Abnormal gait, decreased activity tolerance, decreased balance, decreased endurance, decreased mobility, difficulty walking, decreased ROM, decreased strength, impaired flexibility, impaired sensation, postural dysfunction, and pain  ACTIVITY LIMITATIONS: lifting, standing, stairs, transfers, bed mobility, bathing, hygiene/grooming, locomotion level, and caring for others  PARTICIPATION LIMITATIONS: cleaning, laundry, shopping, community activity, and yard work  PERSONAL FACTORS: Age, Fitness, Past/current experiences, and 1-2 comorbidities: Lumbar stenosis,  3 brain aneurysms, kidney transplant in 2022  are also affecting patient's functional outcome.   REHAB POTENTIAL: Good  CLINICAL DECISION MAKING: Evolving/moderate complexity  EVALUATION COMPLEXITY: Moderate  PLAN:  PT FREQUENCY: 2x/week  PT DURATION: 6 weeks  PLANNED INTERVENTIONS: 97164- PT Re-evaluation, 97110-Therapeutic exercises, 97530- Therapeutic activity, 97112- Neuromuscular re-education, 97535- Self Care, 60454- Manual therapy,  [phone]- Gait training, [phone]- Orthotic Fit/training, [phone]- Aquatic Therapy, Patient/Family education, Balance training, Stair training, Dry Needling, Joint mobilization, Vestibular training, and DME instructions  PLAN FOR NEXT SESSION: Check BP. Add to HEP for improved spinal and R hip mobility, strength. Global endurance and functional strength (lifting), eccentric control, head turns, anticipatory balance   Jill Alexanders Ashtian Villacis, PT, DPT 11/29/2022, 2:01 PM

## 2022-12-01 ENCOUNTER — Ambulatory Visit: Payer: Medicare HMO | Admitting: Physical Therapy

## 2022-12-01 VITALS — BP 110/55 | HR 56

## 2022-12-01 DIAGNOSIS — R296 Repeated falls: Secondary | ICD-10-CM | POA: Diagnosis not present

## 2022-12-01 DIAGNOSIS — R2689 Other abnormalities of gait and mobility: Secondary | ICD-10-CM | POA: Diagnosis not present

## 2022-12-01 DIAGNOSIS — R2681 Unsteadiness on feet: Secondary | ICD-10-CM | POA: Diagnosis not present

## 2022-12-01 DIAGNOSIS — M6281 Muscle weakness (generalized): Secondary | ICD-10-CM

## 2022-12-01 DIAGNOSIS — M5431 Sciatica, right side: Secondary | ICD-10-CM | POA: Diagnosis not present

## 2022-12-01 NOTE — Therapy (Signed)
OUTPATIENT PHYSICAL THERAPY NEURO TREATMENT   Patient Name: Cristina Wilson MRN: [id number] DOB:1950-12-03, 72 y.o., female Today's Date: 12/01/2022   PCP: Merlene Laughter, MD REFERRING PROVIDER: Joaquim Nam, MD  END OF SESSION:  PT End of Session - 12/01/22 0851     Visit Number 5    Number of Visits 13    Date for PT Re-Evaluation 01/14/23    Authorization Type [employer]    PT Start Time 0849    PT Stop Time 0929    PT Time Calculation (min) 40 min    Equipment Utilized During Treatment Gait belt    Activity Tolerance Patient tolerated treatment well    Behavior During Therapy [facility] for tasks assessed/performed              Past Medical History:  Diagnosis Date   Abdominal pain 06/06/2018   Acute renal failure (ARF) (HCC) 06/22/2018   Anemia in chronic kidney disease 08/25/2018   Anemia, unspecified 07/05/2018   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 states she has 2 aneurysms   Chronic low back pain 04/12/2017   Chronic pain syndrome 04/12/2017   Closed fracture of left distal radius    De Quervain's tenosynovitis 02/10/2018   Degeneration of lumbar intervertebral disc 03/01/2017   End stage renal failure on dialysis [facility])    M/W/F on [employer]   GERD (gastroesophageal reflux disease)    Goodpasture syndrome (HCC)    Headache 10/30/2014   Hyperlipidemia 04/17/2018   Hypertension    Hypothyroidism    Iron deficiency anemia, unspecified 07/12/2018   PONV (postoperative nausea and vomiting)    violent vomiting   Sacral back pain 05/25/2016   Scoliosis of lumbar spine 03/01/2017   Secondary hyperparathyroidism of renal origin (HCC) 08/22/2018   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   Thyroid disease    Trigger finger of left thumb 02/10/2018   Past Surgical History:  Procedure Laterality Date   ARTERY BIOPSY Right  10/31/2014   Procedure: BIOPSY TEMPORAL ARTERY-RIGHT;  Surgeon: Pryor Ochoa, MD;  Location: [facility] OR;  Service: Vascular;  Laterality: Right;   AV FISTULA PLACEMENT Left 10/11/2018   Procedure: left arm ARTERIOVENOUS (AV) FISTULA  creation;  Surgeon: Maeola Harman, MD;  Location: [facility] OR;  Service: Vascular;  Laterality: Left;   BILATERAL CARPAL TUNNEL RELEASE     BIOPSY  04/29/2019   Procedure: BIOPSY;  Surgeon: Kathi Der, MD;  Location: [facility] END[facility]COPY;  Service: Gastroenterology;;   BIOPSY  11/05/2019   Procedure: BIOPSY;  Surgeon: Kathi Der, MD;  Location: [facility];  Service: Gastroenterology;;   BREAST SURGERY Left    lumpectomy   BUNIONECTOMY Right    CHOLECYSTECTOMY N/A 10/13/2016   Procedure: LAPAROSCOPIC CHOLECYSTECTOMY WITH INTRAOPERATIVE CHOLANGIOGRAM;  Surgeon: Manus Rudd, MD;  Location: [facility] OR;  Service: General;  Laterality: N/A;   COLONOSCOPY WITH PROPOFOL N/A 11/05/2019   Procedure: COLONOSCOPY WITH PROPOFOL;  Surgeon: Kathi Der, MD;  Location: [facility];  Service: Gastroenterology;  Laterality: N/A;   ESOPHAGOGASTRODUODENOSCOPY (EGD) WITH PROPOFOL N/A 04/29/2019   Procedure: ESOPHAGOGASTRODUODENOSCOPY (EGD) WITH PROPOFOL;  Surgeon: Kathi Der, MD;  Location: [facility];  Service: Gastroenterology;  Laterality: N/A;   EYE SURGERY     surgery for cross eye   FOOT FRACTURE SURGERY     OPEN REDUCTION INTERNAL FIXATION (ORIF) DISTAL RADIAL FRACTURE Left  03/29/2017   Procedure: OPEN REDUCTION INTERNAL FIXATION (ORIF)LEFT  DISTAL RADIAL FRACTURE;  Surgeon: Betha Loa, MD;  Location: [facility];  Service: Orthopedics;  Laterality: Left;   POLYPECTOMY  11/05/2019   Procedure: POLYPECTOMY;  Surgeon: Kathi Der, MD;  Location: [facility];  Service: Gastroenterology;;   THYROIDECTOMY     TONSILLECTOMY     WISDOM TOOTH EXTRACTION     Patient Active Problem List   Diagnosis Date Noted   Hypertensive crisis  10/23/2019   History of cerebral aneurysm 10/23/2019   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   Hypoxemia 02/21/2019   Pulmonary edema 03-15-2019   Encounter for immunization 09/26/2018   Anemia in chronic kidney disease 08/25/2018   End stage renal disease (HCC) 08/25/2018   Secondary hyperparathyroidism of renal origin (HCC) 08/22/2018   Referred otalgia of left ear 08/16/2018   Iron deficiency anemia, unspecified 07/12/2018   Shortness of breath 07/06/2018   Anemia, unspecified 07/05/2018   Anxiety 07/05/2018   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   Acute renal failure (ARF) (HCC) 06/22/2018   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   Abdominal pain 06/06/2018   Hydronephrosis due to obstruction of ureteral orifice 06/06/2018   Disease of thyroid gland 04/17/2018   Hyperlipidemia 04/17/2018   Hypertension 04/17/2018   De Quervain's tenosynovitis 02/10/2018   Trigger finger of left thumb 02/10/2018   Chronic low back pain 04/12/2017   Chronic pain syndrome 04/12/2017   Degeneration of lumbar intervertebral disc 03/01/2017   Scoliosis of lumbar spine 03/01/2017   Sacral back pain 05/25/2016   Eye pain 10/30/2014    ONSET DATE: 11/10/2022 (referral)   REFERRING DIAG:  W09.811 (ICD-10-CM) - Spinal stenosis, lumbar region with neurogenic claudication    THERAPY DIAG:  Muscle weakness (generalized)  Other abnormalities of gait and mobility  Unsteadiness on feet  Rationale for Evaluation and Treatment: Rehabilitation  SUBJECTIVE:                                                                                                                                                                                             SUBJECTIVE STATEMENT: Pt reports doing well. Rating her pain as a 6-7/10 today. Had a lot of pain yesterday. No falls.  Pt accompanied by: self  PERTINENT HISTORY: hypothyroidism; HTN; HLD; Goodpasture syndrome;  cerebral aneurysm; ESRD on TTS HD; and chronic pain, kidney replacement (12/14/2020).   PAIN:  Are you having pain? Yes: NPRS scale: 6-7/10 Pain location: RLE Pain description: shooting pain Aggravating factors: "probably this workout"  Relieving factors: Heat/ice   PRECAUTIONS: Fall  RED FLAGS: None   WEIGHT  BEARING RESTRICTIONS: No  FALLS: Has patient fallen in last 6 months? Yes. Number of falls %  LIVING ENVIRONMENT: Lives with:  her dog, Snickers  Lives in: House/apartment Stairs: Yes: External: 5 in carport w/bilateral rails and 2 in back w/no rails steps; see previous Has following equipment at home: Single point cane, Walker - 2 wheeled, shower chair, and walking sticks   PLOF: Independent  PATIENT GOALS: "To establish mobility so that I do not fall and I feel confident"   OBJECTIVE:  Note: Objective measures were completed at Evaluation unless otherwise noted.  DIAGNOSTIC FINDINGS: MRI of lumbar spine from 2022   Disc levels:   T12- L1: Unremarkable.   L1-L2: Ventral spondylitic spurring   L2-L3: Disc narrowing and bulging with mild right facet spurring.   L3-L4: Disc narrowing and bulging.  Mild facet spurring   L4-L5: Disc narrowing and bulging with left inferior foraminal protrusion. Asymmetric left facet spurring. Left foraminal stenosis is mild   L5-S1:Disc narrowing and bulging with endplate spurring eccentric to the left. Negative facets.   IMPRESSION: Generalized lumbar spine degeneration similar to 2017. No impingement to explain right leg symptoms.  COGNITION: Overall cognitive status: Within functional limits for tasks assessed   SENSATION: Shooting pain down her RLE that follows sciatic n. Distribution    POSTURE: rounded shoulders and forward head  LOWER EXTREMITY MMT:  Tested in seated position   MMT Right Eval Left Eval  Hip flexion 4- 4  Hip extension    Hip abduction 5 5  Hip adduction 5 5  Hip internal rotation    Hip  external rotation    Knee flexion 4+ 5  Knee extension 5 5  Ankle dorsiflexion 4 5  Ankle plantarflexion    Ankle inversion    Ankle eversion    (Blank rows = not tested)   GAIT: Gait pattern: step through pattern, decreased arm swing- Right, decreased arm swing- Left, decreased stride length, decreased hip/knee flexion- Right, decreased ankle dorsiflexion- Right, lateral hip instability, and wide BOS Distance walked: Various clinic distances  Assistive device utilized: None Level of assistance: SBA Comments: Pt demonstrates waddle-like gait pattern w/very wide BOS. Noted uncompensated trendelenburg on RLE as well.   VITALS Vitals:   12/01/22 0855  BP: (!) 110/55  Pulse: (!) 56       TODAY'S TREATMENT:      Ther Act  Assessed vitals (see above) and WNL for therapy. Pt drinking water throughout session   Ther Ex  SciFit multi-peaks level 6 for 8 minutes using BUE/BLEs for neural priming for reciprocal movement, dynamic cardiovascular warmup and increased amplitude of stepping. RPE of 4/10 following activity.  In // bars for improved functional hip strength and anticipatory balance strategies:  Lateral/fwd/retro monster walks w/red theraband around distal quads, x5 reps each direction. Min cues for improved eccentric control when side stepping to R side and maintaining BLE abduction throughout. Intermittent UE support throughout.  Fwd 6" hurdle navigation using step-through pattern, x10 reps down and back. Noted scissoring of RLE throughout, so mod cues for abduction of RLE. Pt reported activity being difficult, requiring a lot of concentration for her to perform properly. Pt reported fatigue in RLE >LLE.    PATIENT EDUCATION: Education details: continue HEP Person educated: Patient Education method: Explanation, Demonstration, and Handouts Education comprehension: verbalized understanding and returned demonstration  HOME EXERCISE PROGRAM: Verbally added seated march overs  on eval   Access Code: [email or web address] URL: [email or web address] Date: 11/23/2022 Prepared by: Alethia Berthold Waris Rodger  Exercises - Seated Piriformis Stretch with Trunk Bend  - 1 x daily - 7 x weekly - 3 sets - 30-45 second  hold - Supine Figure 4 Piriformis Stretch  - 1 x daily - 7 x weekly - 3 sets - 45-60 second hold - Supine Straight Leg Lumbar Rotation Stretch  - 1 x daily - 7 x weekly - 1 sets - 5-10 reps - 10-20 second hold - Seated Hamstring Stretch  - 1 x daily - 7 x weekly - 3 sets - 10 reps - Supine Butterfly Groin Stretch  - 1 x daily - 7 x weekly - 3 sets - 10 reps - Forward Step Down with Heel Tap and Rail Support  - 1 x daily - 7 x weekly - 3 sets - 10 reps - Side Step Down with Counter Support  - 1 x daily - 7 x weekly - 3 sets - 10 reps  GOALS: Goals reviewed with patient? Yes  SHORT TERM GOALS: Target date: 12/17/2022   Pt will be independent with initial  HEP for improved strength, balance, transfers and gait.  Baseline: Goal status: INITIAL  2.  Pt will improve FGA to 18/30 for decreased fall risk   Baseline: 14/30 Goal status: INITIAL  3.  MCTSIB to be assessed and STG/LTG updated  Baseline:  Goal status: DC due to high baseline score   4.  Pt will ambulate greater than or equal to 1625' feet on with RPE of <7/10 for improved cardiovascular endurance and BLE strength.   Baseline: 1583' w/RPE of 8-10/10, no AD (11/25) Goal status: REVISED    LONG TERM GOALS: Target date: 12/31/2022    Pt will be independent with final HEP for improved strength, balance, transfers and gait.  Baseline:  Goal status: INITIAL  2.  Pt will improve FGA to 22/30 for decreased fall risk   Baseline: 14/30 Goal status: REVISED   3.  MCTSIB goal  Baseline:  Goal status: DC due to high baseline score   4.  Pt will ambulate greater than or equal to 1700 feet on with <5/10 RPE for improved cardiovascular endurance and BLE strength.   Baseline: 1583' no AD  RPE of 8-10/10 Goal status: REVISED    ASSESSMENT:  CLINICAL IMPRESSION: Emphasis of skilled PT session on improved functional BLE strength, step clearance and anticipatory balance. Pt continues to be most limited by functional hip weakness bilaterally, resulting in poor eccentric control of LLE and scissoring of RLE. Pt did improve w/verbal cues for hip abduction w/activity. Continue POC.    OBJECTIVE IMPAIRMENTS: Abnormal gait, decreased activity tolerance, decreased balance, decreased endurance, decreased mobility, difficulty walking, decreased ROM, decreased strength, impaired flexibility, impaired sensation, postural dysfunction, and pain  ACTIVITY LIMITATIONS: lifting, standing, stairs, transfers, bed mobility, bathing, hygiene/grooming, locomotion level, and caring for others  PARTICIPATION LIMITATIONS: cleaning, laundry, shopping, community activity, and yard work  PERSONAL FACTORS: Age, Fitness, Past/current experiences, and 1-2 comorbidities: Lumbar stenosis, 3 brain aneurysms, kidney transplant in 2022  are also affecting patient's functional outcome.   REHAB POTENTIAL: Good  CLINICAL DECISION MAKING: Evolving/moderate complexity  EVALUATION COMPLEXITY: Moderate  PLAN:  PT FREQUENCY: 2x/week  PT DURATION: 6 weeks  PLANNED INTERVENTIONS: 97164- PT Re-evaluation, 97110-Therapeutic exercises, 97530- Therapeutic activity, 97112- Neuromuscular re-education, 97535- Self Care, 62130- Manual therapy, [phone]- Gait training, [phone]- Orthotic Fit/training, [phone]- Aquatic Therapy, Patient/Family education, Balance training, Stair training, Dry Needling, Joint mobilization, Vestibular training, and DME instructions  PLAN FOR NEXT SESSION: Check BP.  Add to HEP for improved spinal and R hip mobility, strength. Global endurance and functional strength (lifting), eccentric control, head turns, anticipatory balance, blaze pods    Jill Alexanders Edyn Qazi, PT, DPT 12/01/2022, 9:31  AM

## 2022-12-04 ENCOUNTER — Ambulatory Visit
Admission: RE | Admit: 2022-12-04 | Discharge: 2022-12-04 | Disposition: A | Discharge status: Home / Self Care | Payer: Medicare HMO | Source: Ambulatory Visit | Attending: Neurological Surgery | Admitting: Neurological Surgery

## 2022-12-04 DIAGNOSIS — G959 Disease of spinal cord, unspecified: Secondary | ICD-10-CM

## 2022-12-04 DIAGNOSIS — M47814 Spondylosis without myelopathy or radiculopathy, thoracic region: Secondary | ICD-10-CM | POA: Diagnosis not present

## 2022-12-04 DIAGNOSIS — M47812 Spondylosis without myelopathy or radiculopathy, cervical region: Secondary | ICD-10-CM | POA: Diagnosis not present

## 2022-12-06 ENCOUNTER — Ambulatory Visit: Payer: Medicare HMO | Admitting: Podiatry

## 2022-12-06 DIAGNOSIS — Z5181 Encounter for therapeutic drug level monitoring: Secondary | ICD-10-CM | POA: Diagnosis not present

## 2022-12-06 DIAGNOSIS — Z940 Kidney transplant status: Secondary | ICD-10-CM | POA: Diagnosis not present

## 2022-12-07 ENCOUNTER — Ambulatory Visit: Payer: Medicare HMO | Attending: Vascular Surgery | Admitting: Physical Therapy

## 2022-12-07 VITALS — BP 108/76 | HR 69

## 2022-12-07 DIAGNOSIS — M5459 Other low back pain: Secondary | ICD-10-CM | POA: Insufficient documentation

## 2022-12-07 DIAGNOSIS — M6281 Muscle weakness (generalized): Secondary | ICD-10-CM | POA: Diagnosis not present

## 2022-12-07 DIAGNOSIS — R2689 Other abnormalities of gait and mobility: Secondary | ICD-10-CM | POA: Diagnosis not present

## 2022-12-07 DIAGNOSIS — R2681 Unsteadiness on feet: Secondary | ICD-10-CM | POA: Diagnosis not present

## 2022-12-07 NOTE — Therapy (Signed)
OUTPATIENT PHYSICAL THERAPY NEURO TREATMENT   Patient Name: Cristina Wilson MRN: [id number] DOB:December 10, 1950, 72 y.o., female Today's Date: 12/07/2022   PCP: Merlene Laughter, MD REFERRING PROVIDER: Joaquim Nam, MD  END OF SESSION:  PT End of Session - 12/07/22 1021     Visit Number 6    Number of Visits 13    Date for PT Re-Evaluation 01/14/23    Authorization Type [employer]    PT Start Time 1020    PT Stop Time 1058    PT Time Calculation (min) 38 min    Equipment Utilized During Treatment Gait belt    Activity Tolerance Patient tolerated treatment well    Behavior During Therapy WFL for tasks assessed/performed              Past Medical History:  Diagnosis Date   Abdominal pain 06/06/2018   Acute renal failure (ARF) (HCC) 06/22/2018   Anemia in chronic kidney disease 08/25/2018   Anemia, unspecified 07/05/2018   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 states she has 2 aneurysms   Chronic low back pain 04/12/2017   Chronic pain syndrome 04/12/2017   Closed fracture of left distal radius    De Quervain's tenosynovitis 02/10/2018   Degeneration of lumbar intervertebral disc 03/01/2017   End stage renal failure on dialysis [facility])    M/W/F on [employer]   GERD (gastroesophageal reflux disease)    Goodpasture syndrome (HCC)    Headache 10/30/2014   Hyperlipidemia 04/17/2018   Hypertension    Hypothyroidism    Iron deficiency anemia, unspecified 07/12/2018   PONV (postoperative nausea and vomiting)    violent vomiting   Sacral back pain 05/25/2016   Scoliosis of lumbar spine 03/01/2017   Secondary hyperparathyroidism of renal origin (HCC) 08/22/2018   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   Thyroid disease    Trigger finger of left thumb 02/10/2018   Past Surgical History:  Procedure Laterality Date   ARTERY BIOPSY Right 10/31/2014    Procedure: BIOPSY TEMPORAL ARTERY-RIGHT;  Surgeon: Pryor Ochoa, MD;  Location: [facility] OR;  Service: Vascular;  Laterality: Right;   AV FISTULA PLACEMENT Left 10/11/2018   Procedure: left arm ARTERIOVENOUS (AV) FISTULA  creation;  Surgeon: Maeola Harman, MD;  Location: [facility] OR;  Service: Vascular;  Laterality: Left;   BILATERAL CARPAL TUNNEL RELEASE     BIOPSY  04/29/2019   Procedure: BIOPSY;  Surgeon: Kathi Der, MD;  Location: [facility] END[facility]COPY;  Service: Gastroenterology;;   BIOPSY  11/05/2019   Procedure: BIOPSY;  Surgeon: Kathi Der, MD;  Location: [facility];  Service: Gastroenterology;;   BREAST SURGERY Left    lumpectomy   BUNIONECTOMY Right    CHOLECYSTECTOMY N/A 10/13/2016   Procedure: LAPAROSCOPIC CHOLECYSTECTOMY WITH INTRAOPERATIVE CHOLANGIOGRAM;  Surgeon: Manus Rudd, MD;  Location: [facility] OR;  Service: General;  Laterality: N/A;   COLONOSCOPY WITH PROPOFOL N/A 11/05/2019   Procedure: COLONOSCOPY WITH PROPOFOL;  Surgeon: Kathi Der, MD;  Location: [facility];  Service: Gastroenterology;  Laterality: N/A;   ESOPHAGOGASTRODUODENOSCOPY (EGD) WITH PROPOFOL N/A 04/29/2019   Procedure: ESOPHAGOGASTRODUODENOSCOPY (EGD) WITH PROPOFOL;  Surgeon: Kathi Der, MD;  Location: [facility];  Service: Gastroenterology;  Laterality: N/A;   EYE SURGERY     surgery for cross eye   FOOT FRACTURE SURGERY     OPEN REDUCTION INTERNAL FIXATION (ORIF) DISTAL RADIAL FRACTURE Left  03/29/2017   Procedure: OPEN REDUCTION INTERNAL FIXATION (ORIF)LEFT  DISTAL RADIAL FRACTURE;  Surgeon: Betha Loa, MD;  Location: [facility];  Service: Orthopedics;  Laterality: Left;   POLYPECTOMY  11/05/2019   Procedure: POLYPECTOMY;  Surgeon: Kathi Der, MD;  Location: [facility];  Service: Gastroenterology;;   THYROIDECTOMY     TONSILLECTOMY     WISDOM TOOTH EXTRACTION     Patient Active Problem List   Diagnosis Date Noted   Hypertensive crisis 10/23/2019    History of cerebral aneurysm 10/23/2019   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   Hypoxemia 02/21/2019   Pulmonary edema 03-11-19   Encounter for immunization 09/26/2018   Anemia in chronic kidney disease 08/25/2018   End stage renal disease (HCC) 08/25/2018   Secondary hyperparathyroidism of renal origin (HCC) 08/22/2018   Referred otalgia of left ear 08/16/2018   Iron deficiency anemia, unspecified 07/12/2018   Shortness of breath 07/06/2018   Anemia, unspecified 07/05/2018   Anxiety 07/05/2018   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   Acute renal failure (ARF) (HCC) 06/22/2018   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   Abdominal pain 06/06/2018   Hydronephrosis due to obstruction of ureteral orifice 06/06/2018   Disease of thyroid gland 04/17/2018   Hyperlipidemia 04/17/2018   Hypertension 04/17/2018   De Quervain's tenosynovitis 02/10/2018   Trigger finger of left thumb 02/10/2018   Chronic low back pain 04/12/2017   Chronic pain syndrome 04/12/2017   Degeneration of lumbar intervertebral disc 03/01/2017   Scoliosis of lumbar spine 03/01/2017   Sacral back pain 05/25/2016   Eye pain 10/30/2014    ONSET DATE: 11/10/2022 (referral)   REFERRING DIAG:  V95.638 (ICD-10-CM) - Spinal stenosis, lumbar region with neurogenic claudication    THERAPY DIAG:  Muscle weakness (generalized)  Other abnormalities of gait and mobility  Unsteadiness on feet  Rationale for Evaluation and Treatment: Rehabilitation  SUBJECTIVE:                                                                                                                                                                                             SUBJECTIVE STATEMENT: Pt reports doing well. Pain is about the same. No falls or acute changes.   Pt accompanied by: self  PERTINENT HISTORY: hypothyroidism; HTN; HLD; Goodpasture syndrome; cerebral aneurysm; ESRD on TTS HD; and  chronic pain, kidney replacement (12/14/2020).   PAIN:  Are you having pain? Yes: NPRS scale: 6-7/10 Pain location: RLE Pain description: shooting pain Aggravating factors: "probably this workout"  Relieving factors: Heat/ice   PRECAUTIONS: Fall  RED FLAGS: None   WEIGHT BEARING RESTRICTIONS: No  FALLS: Has patient fallen in last 6 months? Yes. Number of falls %  LIVING ENVIRONMENT: Lives with:  her dog, Snickers  Lives in: House/apartment Stairs: Yes: External: 5 in carport w/bilateral rails and 2 in back w/no rails steps; see previous Has following equipment at home: Single point cane, Walker - 2 wheeled, shower chair, and walking sticks   PLOF: Independent  PATIENT GOALS: "To establish mobility so that I do not fall and I feel confident"   OBJECTIVE:  Note: Objective measures were completed at Evaluation unless otherwise noted.  DIAGNOSTIC FINDINGS: MRI of lumbar spine from 2022   Disc levels:   T12- L1: Unremarkable.   L1-L2: Ventral spondylitic spurring   L2-L3: Disc narrowing and bulging with mild right facet spurring.   L3-L4: Disc narrowing and bulging.  Mild facet spurring   L4-L5: Disc narrowing and bulging with left inferior foraminal protrusion. Asymmetric left facet spurring. Left foraminal stenosis is mild   L5-S1:Disc narrowing and bulging with endplate spurring eccentric to the left. Negative facets.   IMPRESSION: Generalized lumbar spine degeneration similar to 2017. No impingement to explain right leg symptoms.  COGNITION: Overall cognitive status: Within functional limits for tasks assessed   SENSATION: Shooting pain down her RLE that follows sciatic n. Distribution    POSTURE: rounded shoulders and forward head  LOWER EXTREMITY MMT:  Tested in seated position   MMT Right Eval Left Eval  Hip flexion 4- 4  Hip extension    Hip abduction 5 5  Hip adduction 5 5  Hip internal rotation    Hip external rotation    Knee flexion 4+  5  Knee extension 5 5  Ankle dorsiflexion 4 5  Ankle plantarflexion    Ankle inversion    Ankle eversion    (Blank rows = not tested)   GAIT: Gait pattern: step through pattern, decreased arm swing- Right, decreased arm swing- Left, decreased stride length, decreased hip/knee flexion- Right, decreased ankle dorsiflexion- Right, lateral hip instability, and wide BOS Distance walked: Various clinic distances  Assistive device utilized: None Level of assistance: SBA Comments: Pt demonstrates waddle-like gait pattern w/very wide BOS. Noted uncompensated trendelenburg on RLE as well.   VITALS Vitals:   12/07/22 1026 12/07/22 1039  BP: (!) 93/59 108/76  Pulse: 69 69      TODAY'S TREATMENT:      Ther Act  Assessed vitals (see above) and BP low but within limits for session. Pt asymptomatic but continued to monitor throughout session.   Ther Ex  SciFit multi-peaks level 10 for 8 minutes using BUE/BLEs for neural priming for reciprocal movement, dynamic cardiovascular warmup and increased amplitude of stepping. Reassessed BP (see above) and BP responded appropriately to cardiovascular exercise.  In // bars:  6 Blaze pods on random reach setting for improved sequencing of turns, LE coordination, step clearance and single leg stability.  Performed on 2.5 minute intervals with 30-60 second rest periods.  Pt requires SBA guarding and BUE support. Round 1:  3 pods placed in 2 // lines on either side of bars while navigating 2 6" hurdles setup.  24 hits. Noted frequent crossover of BLEs and turning too quickly, resulting in instability.  Round 2:  Same setup.  20 hits. Cued pt for proper turn technique and importance of slowing down and pt able to demonstrate well  Round 3:  same setup.  34 hits. Fwd/retro stepping  PATIENT EDUCATION: Education details: continue HEP Person educated: Patient Education method: Explanation, Demonstration, and  Handouts Education comprehension: verbalized  understanding and returned demonstration  HOME EXERCISE PROGRAM: Verbally added seated march overs on eval   Access Code: [email or web address] URL: [email or web address] Date: 11/23/2022 Prepared by: Alethia Berthold Christy Friede  Exercises - Seated Piriformis Stretch with Trunk Bend  - 1 x daily - 7 x weekly - 3 sets - 30-45 second  hold - Supine Figure 4 Piriformis Stretch  - 1 x daily - 7 x weekly - 3 sets - 45-60 second hold - Supine Straight Leg Lumbar Rotation Stretch  - 1 x daily - 7 x weekly - 1 sets - 5-10 reps - 10-20 second hold - Seated Hamstring Stretch  - 1 x daily - 7 x weekly - 3 sets - 10 reps - Supine Butterfly Groin Stretch  - 1 x daily - 7 x weekly - 3 sets - 10 reps - Forward Step Down with Heel Tap and Rail Support  - 1 x daily - 7 x weekly - 3 sets - 10 reps - Side Step Down with Counter Support  - 1 x daily - 7 x weekly - 3 sets - 10 reps  GOALS: Goals reviewed with patient? Yes  SHORT TERM GOALS: Target date: 12/17/2022   Pt will be independent with initial  HEP for improved strength, balance, transfers and gait.  Baseline: Goal status: INITIAL  2.  Pt will improve FGA to 18/30 for decreased fall risk   Baseline: 14/30 Goal status: INITIAL  3.  MCTSIB to be assessed and STG/LTG updated  Baseline:  Goal status: DC due to high baseline score   4.  Pt will ambulate greater than or equal to 1625' feet on with RPE of <7/10 for improved cardiovascular endurance and BLE strength.   Baseline: 1583' w/RPE of 8-10/10, no AD (11/25) Goal status: REVISED    LONG TERM GOALS: Target date: 12/31/2022    Pt will be independent with final HEP for improved strength, balance, transfers and gait.  Baseline:  Goal status: INITIAL  2.  Pt will improve FGA to 22/30 for decreased fall risk   Baseline: 14/30 Goal status: REVISED   3.  MCTSIB goal  Baseline:  Goal status: DC due to high baseline score   4.  Pt will ambulate greater than or equal to 1700 feet on  with <5/10 RPE for improved cardiovascular endurance and BLE strength.   Baseline: 1583' no AD RPE of 8-10/10 Goal status: REVISED    ASSESSMENT:  CLINICAL IMPRESSION: Emphasis of skilled PT session on LE coordination, stability w/turns and functional BLE strength. Pt's BP initially low but did respond appropriately to cardiovascular exercise. Pt asymptomatic throughout session. Pt demonstrated improved step clearance/hip flexor strength this date w/hurdles and was most challenged by breaking circumduction compensation w/RLE. Pt demonstrates occasional crossover stepping w/LLE and impulsivity w/turns, resulting in imbalance. However, pt responds well to use of verbal cues for proper foot positioning. Continue POC.    OBJECTIVE IMPAIRMENTS: Abnormal gait, decreased activity tolerance, decreased balance, decreased endurance, decreased mobility, difficulty walking, decreased ROM, decreased strength, impaired flexibility, impaired sensation, postural dysfunction, and pain  ACTIVITY LIMITATIONS: lifting, standing, stairs, transfers, bed mobility, bathing, hygiene/grooming, locomotion level, and caring for others  PARTICIPATION LIMITATIONS: cleaning, laundry, shopping, community activity, and yard work  PERSONAL FACTORS: Age, Fitness, Past/current experiences, and 1-2 comorbidities: Lumbar stenosis, 3 brain aneurysms, kidney transplant in 2022  are also affecting patient's functional outcome.   REHAB POTENTIAL: Good  CLINICAL DECISION MAKING: Evolving/moderate complexity  EVALUATION COMPLEXITY: Moderate  PLAN:  PT FREQUENCY: 2x/week  PT DURATION: 6 weeks  PLANNED INTERVENTIONS: 97164- PT Re-evaluation, 97110-Therapeutic exercises, 97530- Therapeutic activity, 97112- Neuromuscular re-education, 97535- Self Care, 16109- Manual therapy, [phone]- Gait training, [phone]- Orthotic Fit/training, [phone]- Aquatic Therapy, Patient/Family education, Balance training, Stair training, Dry Needling,  Joint mobilization, Vestibular training, and DME instructions  PLAN FOR NEXT SESSION: Check BP. Add to HEP for improved spinal and R hip mobility, strength. Global endurance and functional strength (lifting), eccentric control, head turns, anticipatory balance, blaze pods, rebounder   Jill Alexanders Enda Santo, PT, DPT 12/07/2022, 10:58 AM

## 2022-12-10 ENCOUNTER — Ambulatory Visit: Payer: Medicare HMO | Admitting: Physical Therapy

## 2022-12-10 VITALS — BP 100/54 | HR 62

## 2022-12-10 DIAGNOSIS — R2689 Other abnormalities of gait and mobility: Secondary | ICD-10-CM | POA: Diagnosis not present

## 2022-12-10 DIAGNOSIS — M6281 Muscle weakness (generalized): Secondary | ICD-10-CM

## 2022-12-10 DIAGNOSIS — R2681 Unsteadiness on feet: Secondary | ICD-10-CM | POA: Diagnosis not present

## 2022-12-10 DIAGNOSIS — M5459 Other low back pain: Secondary | ICD-10-CM

## 2022-12-10 NOTE — Therapy (Signed)
OUTPATIENT PHYSICAL THERAPY NEURO TREATMENT   Patient Name: Cristina Wilson MRN: [id number] DOB:Jun 10, 1950, 72 y.o., female Today's Date: 12/10/2022   PCP: Merlene Laughter, MD REFERRING PROVIDER: Joaquim Nam, MD  END OF SESSION:  PT End of Session - 12/10/22 1022     Visit Number 7    Number of Visits 13    Date for PT Re-Evaluation 01/14/23    Authorization Type [employer]    PT Start Time 1018    PT Stop Time 1103    PT Time Calculation (min) 45 min    Equipment Utilized During Treatment Gait belt    Activity Tolerance Patient tolerated treatment well    Behavior During Therapy WFL for tasks assessed/performed              Past Medical History:  Diagnosis Date   Abdominal pain 06/06/2018   Acute renal failure (ARF) (HCC) 06/22/2018   Anemia in chronic kidney disease 08/25/2018   Anemia, unspecified 07/05/2018   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 states she has 2 aneurysms   Chronic low back pain 04/12/2017   Chronic pain syndrome 04/12/2017   Closed fracture of left distal radius    De Quervain's tenosynovitis 02/10/2018   Degeneration of lumbar intervertebral disc 03/01/2017   End stage renal failure on dialysis [facility])    M/W/F on [employer]   GERD (gastroesophageal reflux disease)    Goodpasture syndrome (HCC)    Headache 10/30/2014   Hyperlipidemia 04/17/2018   Hypertension    Hypothyroidism    Iron deficiency anemia, unspecified 07/12/2018   PONV (postoperative nausea and vomiting)    violent vomiting   Sacral back pain 05/25/2016   Scoliosis of lumbar spine 03/01/2017   Secondary hyperparathyroidism of renal origin (HCC) 08/22/2018   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   Thyroid disease    Trigger finger of left thumb 02/10/2018   Past Surgical History:  Procedure Laterality Date   ARTERY BIOPSY Right 10/31/2014    Procedure: BIOPSY TEMPORAL ARTERY-RIGHT;  Surgeon: Pryor Ochoa, MD;  Location: [facility] OR;  Service: Vascular;  Laterality: Right;   AV FISTULA PLACEMENT Left 10/11/2018   Procedure: left arm ARTERIOVENOUS (AV) FISTULA  creation;  Surgeon: Maeola Harman, MD;  Location: [facility] OR;  Service: Vascular;  Laterality: Left;   BILATERAL CARPAL TUNNEL RELEASE     BIOPSY  04/29/2019   Procedure: BIOPSY;  Surgeon: Kathi Der, MD;  Location: [facility];  Service: Gastroenterology;;   BIOPSY  11/05/2019   Procedure: BIOPSY;  Surgeon: Kathi Der, MD;  Location: [facility];  Service: Gastroenterology;;   BREAST SURGERY Left    lumpectomy   BUNIONECTOMY Right    CHOLECYSTECTOMY N/A 10/13/2016   Procedure: LAPAROSCOPIC CHOLECYSTECTOMY WITH INTRAOPERATIVE CHOLANGIOGRAM;  Surgeon: Manus Rudd, MD;  Location: [facility] OR;  Service: General;  Laterality: N/A;   COLONOSCOPY WITH PROPOFOL N/A 11/05/2019   Procedure: COLONOSCOPY WITH PROPOFOL;  Surgeon: Kathi Der, MD;  Location: [facility];  Service: Gastroenterology;  Laterality: N/A;   ESOPHAGOGASTRODUODENOSCOPY (EGD) WITH PROPOFOL N/A 04/29/2019   Procedure: ESOPHAGOGASTRODUODENOSCOPY (EGD) WITH PROPOFOL;  Surgeon: Kathi Der, MD;  Location: [facility];  Service: Gastroenterology;  Laterality: N/A;   EYE SURGERY     surgery for cross eye   FOOT FRACTURE SURGERY     OPEN REDUCTION INTERNAL FIXATION (ORIF) DISTAL RADIAL FRACTURE Left  03/29/2017   Procedure: OPEN REDUCTION INTERNAL FIXATION (ORIF)LEFT  DISTAL RADIAL FRACTURE;  Surgeon: Betha Loa, MD;  Location: [facility];  Service: Orthopedics;  Laterality: Left;   POLYPECTOMY  11/05/2019   Procedure: POLYPECTOMY;  Surgeon: Kathi Der, MD;  Location: [facility];  Service: Gastroenterology;;   THYROIDECTOMY     TONSILLECTOMY     WISDOM TOOTH EXTRACTION     Patient Active Problem List   Diagnosis Date Noted   Hypertensive crisis 10/23/2019    History of cerebral aneurysm 10/23/2019   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   Hypoxemia 02/21/2019   Pulmonary edema 2019-03-02   Encounter for immunization 09/26/2018   Anemia in chronic kidney disease 08/25/2018   End stage renal disease (HCC) 08/25/2018   Secondary hyperparathyroidism of renal origin (HCC) 08/22/2018   Referred otalgia of left ear 08/16/2018   Iron deficiency anemia, unspecified 07/12/2018   Shortness of breath 07/06/2018   Anemia, unspecified 07/05/2018   Anxiety 07/05/2018   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   Acute renal failure (ARF) (HCC) 06/22/2018   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   Abdominal pain 06/06/2018   Hydronephrosis due to obstruction of ureteral orifice 06/06/2018   Disease of thyroid gland 04/17/2018   Hyperlipidemia 04/17/2018   Hypertension 04/17/2018   De Quervain's tenosynovitis 02/10/2018   Trigger finger of left thumb 02/10/2018   Chronic low back pain 04/12/2017   Chronic pain syndrome 04/12/2017   Degeneration of lumbar intervertebral disc 03/01/2017   Scoliosis of lumbar spine 03/01/2017   Sacral back pain 05/25/2016   Eye pain 10/30/2014    ONSET DATE: 11/10/2022 (referral)   REFERRING DIAG:  W09.811 (ICD-10-CM) - Spinal stenosis, lumbar region with neurogenic claudication    THERAPY DIAG:  Muscle weakness (generalized)  Other abnormalities of gait and mobility  Unsteadiness on feet  Other low back pain  Rationale for Evaluation and Treatment: Rehabilitation  SUBJECTIVE:                                                                                                                                                                                             SUBJECTIVE STATEMENT: Pt reports having a lot of pain today. Yesterday was worse. Could barely move her back this morning. Denies falls.   Pt accompanied by: self  PERTINENT HISTORY: hypothyroidism; HTN; HLD;  Goodpasture syndrome; cerebral aneurysm; ESRD on TTS HD; and chronic pain, kidney replacement (12/14/2020).   PAIN:  Are you having pain? Yes: NPRS scale: 6-7/10 Pain location: low back Pain description: aching pain Aggravating factors: "probably this workout"  Relieving factors: Heat/ice   PRECAUTIONS:  Fall  RED FLAGS: None   WEIGHT BEARING RESTRICTIONS: No  FALLS: Has patient fallen in last 6 months? Yes. Number of falls %  LIVING ENVIRONMENT: Lives with:  her dog, Snickers  Lives in: House/apartment Stairs: Yes: External: 5 in carport w/bilateral rails and 2 in back w/no rails steps; see previous Has following equipment at home: Single point cane, Walker - 2 wheeled, shower chair, and walking sticks   PLOF: Independent  PATIENT GOALS: "To establish mobility so that I do not fall and I feel confident"   OBJECTIVE:  Note: Objective measures were completed at Evaluation unless otherwise noted.  DIAGNOSTIC FINDINGS: MRI of lumbar spine from 2022   Disc levels:   T12- L1: Unremarkable.   L1-L2: Ventral spondylitic spurring   L2-L3: Disc narrowing and bulging with mild right facet spurring.   L3-L4: Disc narrowing and bulging.  Mild facet spurring   L4-L5: Disc narrowing and bulging with left inferior foraminal protrusion. Asymmetric left facet spurring. Left foraminal stenosis is mild   L5-S1:Disc narrowing and bulging with endplate spurring eccentric to the left. Negative facets.   IMPRESSION: Generalized lumbar spine degeneration similar to 2017. No impingement to explain right leg symptoms.  COGNITION: Overall cognitive status: Within functional limits for tasks assessed   SENSATION: Shooting pain down her RLE that follows sciatic n. Distribution    POSTURE: rounded shoulders and forward head  LOWER EXTREMITY MMT:  Tested in seated position   MMT Right Eval Left Eval  Hip flexion 4- 4  Hip extension    Hip abduction 5 5  Hip adduction 5 5  Hip  internal rotation    Hip external rotation    Knee flexion 4+ 5  Knee extension 5 5  Ankle dorsiflexion 4 5  Ankle plantarflexion    Ankle inversion    Ankle eversion    (Blank rows = not tested)   GAIT: Gait pattern: step through pattern, decreased arm swing- Right, decreased arm swing- Left, decreased stride length, decreased hip/knee flexion- Right, decreased ankle dorsiflexion- Right, lateral hip instability, and wide BOS Distance walked: Various clinic distances  Assistive device utilized: None Level of assistance: SBA Comments: Pt demonstrates waddle-like gait pattern w/very wide BOS. Noted uncompensated trendelenburg on RLE as well.   VITALS Vitals:   12/10/22 1027  BP: (!) 100/54  Pulse: 62       TODAY'S TREATMENT:      Ther Act  Assessed vitals (see above) and BP low but within limits for session. Pt asymptomatic but continued to monitor throughout session.  Reviewed imaging of C-spine and T-spine, but images have not been read by MD. Pt reports she got a notification in hey MyChart but could not see it, so was hopeful it was her MRI results   Ther Ex  Large blue theraball roll outs in fwd and lateral directions, x5 minutes, for improved spinal mobility and low back pain modulation.  Seated pallof presses w/orange resistance band, x8 per side. Increased difficulty when anchored to R side > L. Pt reported this flared up her sciatica as well.  Seated march overs using 12# KB on its side and holding 2# dowel in front rack position, x12 per side, for improved functional core and hip strength. Increased difficulty performing on R side > L. Encouraged pt to work on this at home.  In // bars, on rockerboard in A/P direction for improved ankle strategy, vestibular input and reactive balance strategies:  EO w/intermittent UE support, x4 minutes. Occasional  LOB posteriorly which pt demonstrated premature righting reactions to correct.  EC x1 minute w/CGA. Pt losing balance  equally in anterior and posterior direction but did have good righting reactions.   PATIENT EDUCATION: Education details: continue HEP Person educated: Patient Education method: Explanation, Demonstration, and Handouts Education comprehension: verbalized understanding and returned demonstration  HOME EXERCISE PROGRAM: Verbally added seated march overs on eval   Access Code: [email or web address] URL: [email or web address] Date: 11/23/2022 Prepared by: Alethia Berthold Natalio Salois  Exercises - Seated Piriformis Stretch with Trunk Bend  - 1 x daily - 7 x weekly - 3 sets - 30-45 second  hold - Supine Figure 4 Piriformis Stretch  - 1 x daily - 7 x weekly - 3 sets - 45-60 second hold - Supine Straight Leg Lumbar Rotation Stretch  - 1 x daily - 7 x weekly - 1 sets - 5-10 reps - 10-20 second hold - Seated Hamstring Stretch  - 1 x daily - 7 x weekly - 3 sets - 10 reps - Supine Butterfly Groin Stretch  - 1 x daily - 7 x weekly - 3 sets - 10 reps - Forward Step Down with Heel Tap and Rail Support  - 1 x daily - 7 x weekly - 3 sets - 10 reps - Side Step Down with Counter Support  - 1 x daily - 7 x weekly - 3 sets - 10 reps  GOALS: Goals reviewed with patient? Yes  SHORT TERM GOALS: Target date: 12/17/2022   Pt will be independent with initial  HEP for improved strength, balance, transfers and gait.  Baseline: Goal status: INITIAL  2.  Pt will improve FGA to 18/30 for decreased fall risk   Baseline: 14/30 Goal status: INITIAL  3.  MCTSIB to be assessed and STG/LTG updated  Baseline:  Goal status: DC due to high baseline score   4.  Pt will ambulate greater than or equal to 1625' feet on with RPE of <7/10 for improved cardiovascular endurance and BLE strength.   Baseline: 1583' w/RPE of 8-10/10, no AD (11/25) Goal status: REVISED    LONG TERM GOALS: Target date: 12/31/2022    Pt will be independent with final HEP for improved strength, balance, transfers and gait.  Baseline:   Goal status: INITIAL  2.  Pt will improve FGA to 22/30 for decreased fall risk   Baseline: 14/30 Goal status: REVISED   3.  MCTSIB goal  Baseline:  Goal status: DC due to high baseline score   4.  Pt will ambulate greater than or equal to 1700 feet on with <5/10 RPE for improved cardiovascular endurance and BLE strength.   Baseline: 1583' no AD RPE of 8-10/10 Goal status: REVISED    ASSESSMENT:  CLINICAL IMPRESSION: Emphasis of skilled PT session on spinal mobility, reactive balance strategies and functional core stability. Pt reporting high levels of pain this date but tolerated session well. Pt continues to be limited by functional core weakness and R hip instability. Pt reports she has had an easier time getting into and out of the car with her left leg since starting therapy.  Continue POC.    OBJECTIVE IMPAIRMENTS: Abnormal gait, decreased activity tolerance, decreased balance, decreased endurance, decreased mobility, difficulty walking, decreased ROM, decreased strength, impaired flexibility, impaired sensation, postural dysfunction, and pain  ACTIVITY LIMITATIONS: lifting, standing, stairs, transfers, bed mobility, bathing, hygiene/grooming, locomotion level, and caring for others  PARTICIPATION LIMITATIONS: cleaning, laundry, shopping, community activity, and yard work  PERSONAL FACTORS: Age, Fitness, Past/current  experiences, and 1-2 comorbidities: Lumbar stenosis, 3 brain aneurysms, kidney transplant in 2022  are also affecting patient's functional outcome.   REHAB POTENTIAL: Good  CLINICAL DECISION MAKING: Evolving/moderate complexity  EVALUATION COMPLEXITY: Moderate  PLAN:  PT FREQUENCY: 2x/week  PT DURATION: 6 weeks  PLANNED INTERVENTIONS: 97164- PT Re-evaluation, 97110-Therapeutic exercises, 97530- Therapeutic activity, 97112- Neuromuscular re-education, 97535- Self Care, 16109- Manual therapy, [phone]- Gait training, [phone]- Orthotic Fit/training, [phone]-  Aquatic Therapy, Patient/Family education, Balance training, Stair training, Dry Needling, Joint mobilization, Vestibular training, and DME instructions  PLAN FOR NEXT SESSION: Check BP. Add to HEP for improved spinal and R hip mobility, strength. Global endurance and functional strength (lifting), eccentric control, head turns, anticipatory balance, blaze pods, rebounder, rockerboard    Kolleen Ochsner E Moustafa Mossa, PT, DPT 12/10/2022, 11:03 AM

## 2022-12-15 ENCOUNTER — Ambulatory Visit: Payer: Medicare HMO | Admitting: Physical Therapy

## 2022-12-15 VITALS — BP 109/70 | HR 61

## 2022-12-15 DIAGNOSIS — M6281 Muscle weakness (generalized): Secondary | ICD-10-CM | POA: Diagnosis not present

## 2022-12-15 DIAGNOSIS — M5459 Other low back pain: Secondary | ICD-10-CM | POA: Diagnosis not present

## 2022-12-15 DIAGNOSIS — R2689 Other abnormalities of gait and mobility: Secondary | ICD-10-CM | POA: Diagnosis not present

## 2022-12-15 DIAGNOSIS — R2681 Unsteadiness on feet: Secondary | ICD-10-CM

## 2022-12-15 NOTE — Therapy (Signed)
OUTPATIENT PHYSICAL THERAPY NEURO TREATMENT   Patient Name: Cristina Wilson MRN: [id number] DOB:[birth year], 72 y.o., female Today's Date: 12/15/2022   PCP: Merlene Laughter, MD REFERRING PROVIDER: Joaquim Nam, MD  END OF SESSION:  PT End of Session - 12/15/22 1234     Visit Number 8    Number of Visits 13    Date for PT Re-Evaluation 01/14/23    Authorization Type [employer]    PT Start Time 1232    PT Stop Time 1316    PT Time Calculation (min) 44 min    Equipment Utilized During Treatment Gait belt    Activity Tolerance Patient tolerated treatment well    Behavior During Therapy WFL for tasks assessed/performed              Past Medical History:  Diagnosis Date   Abdominal pain 06/06/2018   Acute renal failure (ARF) (HCC) 06/22/2018   Anemia in chronic kidney disease 08/25/2018   Anemia, unspecified 07/05/2018   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 states she has 2 aneurysms   Chronic low back pain 04/12/2017   Chronic pain syndrome 04/12/2017   Closed fracture of left distal radius    De Quervain's tenosynovitis 02/10/2018   Degeneration of lumbar intervertebral disc 03/01/2017   End stage renal failure on [facility])    M/W/F on [employer]   GERD (gastroesophageal reflux disease)    Goodpasture syndrome (HCC)    Headache 10/30/2014   Hyperlipidemia 04/17/2018   Hypertension    Hypothyroidism    Iron deficiency anemia, unspecified 07/12/2018   PONV (postoperative nausea and vomiting)    violent vomiting   Sacral back pain 05/25/2016   Scoliosis of lumbar spine 03/01/2017   Secondary hyperparathyroidism of renal origin (HCC) 08/22/2018   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   Thyroid disease    Trigger finger of left thumb 02/10/2018   Past Surgical History:  Procedure Laterality Date   ARTERY BIOPSY Right  10/31/2014   Procedure: BIOPSY TEMPORAL ARTERY-RIGHT;  Surgeon: Pryor Ochoa, MD;  Location: [facility] OR;  Service: Vascular;  Laterality: Right;   AV FISTULA PLACEMENT Left 10/11/2018   Procedure: left arm ARTERIOVENOUS (AV) FISTULA  creation;  Surgeon: Maeola Harman, MD;  Location: [facility] OR;  Service: Vascular;  Laterality: Left;   BILATERAL CARPAL TUNNEL RELEASE     BIOPSY  04/29/2019   Procedure: BIOPSY;  Surgeon: Kathi Der, MD;  Location: [facility] ENDOSCOPY;  Service: Gastroenterology;;   BIOPSY  11/05/2019   Procedure: BIOPSY;  Surgeon: Kathi Der, MD;  Location: [facility]COPY;  Service: Gastroenterology;;   BREAST SURGERY Left    lumpectomy   BUNIONECTOMY Right    CHOLECYSTECTOMY N/A 10/13/2016   Procedure: LAPAROSCOPIC CHOLECYSTECTOMY WITH INTRAOPERATIVE CHOLANGIOGRAM;  Surgeon: Manus Rudd, MD;  Location: [facility] OR;  Service: General;  Laterality: N/A;   COLONOSCOPY WITH PROPOFOL N/A 11/05/2019   Procedure: COLONOSCOPY WITH PROPOFOL;  Surgeon: Kathi Der, MD;  Location: [facility];  Service: Gastroenterology;  Laterality: N/A;   ESOPHAGOGASTRODUODENOSCOPY (EGD) WITH PROPOFOL N/A 04/29/2019   Procedure: ESOPHAGOGASTRODUODENOSCOPY (EGD) WITH PROPOFOL;  Surgeon: Kathi Der, MD;  Location: [facility]COPY;  Service: Gastroenterology;  Laterality: N/A;   EYE SURGERY     surgery for cross eye   FOOT FRACTURE SURGERY     OPEN REDUCTION INTERNAL FIXATION (ORIF) DISTAL RADIAL FRACTURE Left  03/29/2017   Procedure: OPEN REDUCTION INTERNAL FIXATION (ORIF)LEFT  DISTAL RADIAL FRACTURE;  Surgeon: Betha Loa, MD;  Location: [facility];  Service: Orthopedics;  Laterality: Left;   POLYPECTOMY  11/05/2019   Procedure: POLYPECTOMY;  Surgeon: Kathi Der, MD;  Location: [facility];  Service: Gastroenterology;;   THYROIDECTOMY     TONSILLECTOMY     WISDOM TOOTH EXTRACTION     Patient Active Problem List   Diagnosis Date Noted   Hypertensive crisis  10/23/2019   History of cerebral aneurysm 10/23/2019   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   Hypoxemia 02/21/2019   Pulmonary edema 03-10-19   Encounter for immunization 09/26/2018   Anemia in chronic kidney disease 08/25/2018   End stage renal disease (HCC) 08/25/2018   Secondary hyperparathyroidism of renal origin (HCC) 08/22/2018   Referred otalgia of left ear 08/16/2018   Iron deficiency anemia, unspecified 07/12/2018   Shortness of breath 07/06/2018   Anemia, unspecified 07/05/2018   Anxiety 07/05/2018   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   Acute renal failure (ARF) (HCC) 06/22/2018   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   Abdominal pain 06/06/2018   Hydronephrosis due to obstruction of ureteral orifice 06/06/2018   Disease of thyroid gland 04/17/2018   Hyperlipidemia 04/17/2018   Hypertension 04/17/2018   De Quervain's tenosynovitis 02/10/2018   Trigger finger of left thumb 02/10/2018   Chronic low back pain 04/12/2017   Chronic pain syndrome 04/12/2017   Degeneration of lumbar intervertebral disc 03/01/2017   Scoliosis of lumbar spine 03/01/2017   Sacral back pain 05/25/2016   Eye pain 10/30/2014    ONSET DATE: 11/10/2022 (referral)   REFERRING DIAG:  V61.607 (ICD-10-CM) - Spinal stenosis, lumbar region with neurogenic claudication    THERAPY DIAG:  Muscle weakness (generalized)  Other abnormalities of gait and mobility  Unsteadiness on feet  Rationale for Evaluation and Treatment: Rehabilitation  SUBJECTIVE:                                                                                                                                                                                             SUBJECTIVE STATEMENT: Pt reports her pain was bad today but has gotten better since she has been moving around. Denies falls. Sees Dr. Danielle Dess Friday for her MRI.   Pt accompanied by: self  PERTINENT HISTORY:  hypothyroidism; HTN; HLD; Goodpasture syndrome; cerebral aneurysm; ESRD on TTS HD; and chronic pain, kidney replacement (12/14/2020).   PAIN:  Are you having pain? Yes: NPRS scale: 6-7/10 Pain location: low back Pain description: aching pain Aggravating factors: "probably this workout"  Relieving factors: Heat/ice  PRECAUTIONS: Fall  RED FLAGS: None   WEIGHT BEARING RESTRICTIONS: No  FALLS: Has patient fallen in last 6 months? Yes. Number of falls %  LIVING ENVIRONMENT: Lives with:  her dog, Snickers  Lives in: House/apartment Stairs: Yes: External: 5 in carport w/bilateral rails and 2 in back w/no rails steps; see previous Has following equipment at home: Single point cane, Walker - 2 wheeled, shower chair, and walking sticks   PLOF: Independent  PATIENT GOALS: "To establish mobility so that I do not fall and I feel confident"   OBJECTIVE:  Note: Objective measures were completed at Evaluation unless otherwise noted.  DIAGNOSTIC FINDINGS: MRI of lumbar spine from 2022   Disc levels:   T12- L1: Unremarkable.   L1-L2: Ventral spondylitic spurring   L2-L3: Disc narrowing and bulging with mild right facet spurring.   L3-L4: Disc narrowing and bulging.  Mild facet spurring   L4-L5: Disc narrowing and bulging with left inferior foraminal protrusion. Asymmetric left facet spurring. Left foraminal stenosis is mild   L5-S1:Disc narrowing and bulging with endplate spurring eccentric to the left. Negative facets.   IMPRESSION: Generalized lumbar spine degeneration similar to 2017. No impingement to explain right leg symptoms.  COGNITION: Overall cognitive status: Within functional limits for tasks assessed   SENSATION: Shooting pain down her RLE that follows sciatic n. Distribution    POSTURE: rounded shoulders and forward head  LOWER EXTREMITY MMT:  Tested in seated position   MMT Right Eval Left Eval  Hip flexion 4- 4  Hip extension    Hip abduction 5 5   Hip adduction 5 5  Hip internal rotation    Hip external rotation    Knee flexion 4+ 5  Knee extension 5 5  Ankle dorsiflexion 4 5  Ankle plantarflexion    Ankle inversion    Ankle eversion    (Blank rows = not tested)   GAIT: Gait pattern: step through pattern, decreased arm swing- Right, decreased arm swing- Left, decreased stride length, decreased hip/knee flexion- Right, decreased ankle dorsiflexion- Right, lateral hip instability, and wide BOS Distance walked: Various clinic distances  Assistive device utilized: None Level of assistance: SBA Comments: Pt demonstrates waddle-like gait pattern w/very wide BOS. Noted uncompensated trendelenburg on RLE as well.   VITALS Vitals:   12/15/22 1239  BP: 109/70  Pulse: 61        TODAY'S TREATMENT:      Ther Act  Assessed vitals (see above) and WNL  Ther Ex  Modified dead bugs while moving BLEs only, x12 reps per side, for improved functional core stability and BLE strength. Mod verbal cues to maintain neutral spine and TA activation throughout.  Sidelying clamshells, x12 per side, w/min A to stabilize pelvis. Increased difficulty on RLE and pt initially reported pain in R hip but dissipated w/movement.  Alt step ups w/contralateral march to 6" box, x10 reps per side w/BUE support for improved single leg stability, core strength and hip abduction. Added contralateral OH UE reach for added core and balance challenge, x8 per side. Increased difficulty performing on RLE.  Farmer's carriers around gym track, 230' each UE holding 15# KB. Pt required CGA-min A for anterior LOB correction, especially around turns. Pt tends to move too quickly w/fatigue, resulting in tripping over feet (L>R). Cued pt to slow down and pt stated it was harder to stabilize but no instability noted w/slower cadence.  On rockerboard in L/R direction, standing w/no UE support and using mirror for visual biofeedback  on body position for improved midline  orientation and facilitation of hip strategy, x4 minutes. Pt keeping weight on L side and frequently losing balance to L. When equal weight is placed through BLEs, pt reports pain in low back.     PATIENT EDUCATION: Education details: continue HEP, education on importance of body position on COG when walking and its effect on balance.  Person educated: Patient Education method: Explanation Education comprehension: verbalized understanding  HOME EXERCISE PROGRAM: Verbally added seated march overs on eval   Access Code: [email or web address] URL: [email or web address] Date: 11/23/2022 Prepared by: Alethia Berthold Faigy Stretch  Exercises - Seated Piriformis Stretch with Trunk Bend  - 1 x daily - 7 x weekly - 3 sets - 30-45 second  hold - Supine Figure 4 Piriformis Stretch  - 1 x daily - 7 x weekly - 3 sets - 45-60 second hold - Supine Straight Leg Lumbar Rotation Stretch  - 1 x daily - 7 x weekly - 1 sets - 5-10 reps - 10-20 second hold - Seated Hamstring Stretch  - 1 x daily - 7 x weekly - 3 sets - 10 reps - Supine Butterfly Groin Stretch  - 1 x daily - 7 x weekly - 3 sets - 10 reps - Forward Step Down with Heel Tap and Rail Support  - 1 x daily - 7 x weekly - 3 sets - 10 reps - Side Step Down with Counter Support  - 1 x daily - 7 x weekly - 3 sets - 10 reps  GOALS: Goals reviewed with patient? Yes  SHORT TERM GOALS: Target date: 12/17/2022   Pt will be independent with initial  HEP for improved strength, balance, transfers and gait.  Baseline: Goal status: INITIAL  2.  Pt will improve FGA to 18/30 for decreased fall risk   Baseline: 14/30 Goal status: INITIAL  3.  MCTSIB to be assessed and STG/LTG updated  Baseline:  Goal status: DC due to high baseline score   4.  Pt will ambulate greater than or equal to 1625' feet on with RPE of <7/10 for improved cardiovascular endurance and BLE strength.   Baseline: 1583' w/RPE of 8-10/10, no AD (11/25) Goal status: REVISED    LONG  TERM GOALS: Target date: 12/31/2022    Pt will be independent with final HEP for improved strength, balance, transfers and gait.  Baseline:  Goal status: INITIAL  2.  Pt will improve FGA to 22/30 for decreased fall risk   Baseline: 14/30 Goal status: REVISED   3.  MCTSIB goal  Baseline:  Goal status: DC due to high baseline score   4.  Pt will ambulate greater than or equal to 1700 feet on with <5/10 RPE for improved cardiovascular endurance and BLE strength.   Baseline: 1583' no AD RPE of 8-10/10 Goal status: REVISED    ASSESSMENT:  CLINICAL IMPRESSION: Emphasis of skilled PT session on functional core stability, single leg stability and midline orientation. Pt continues to be limited by low back pain and R sciatica but demonstrates improved hip strength since eval. Pt tends to keep weight to L side, resulting in frequent catching of L foot if moving too quickly. Cued pt to practice maintaining upright posture and slowing down w/gait, as she is much more stable w/this. Continue POC.    OBJECTIVE IMPAIRMENTS: Abnormal gait, decreased activity tolerance, decreased balance, decreased endurance, decreased mobility, difficulty walking, decreased ROM, decreased strength, impaired flexibility, impaired sensation, postural dysfunction, and pain  ACTIVITY LIMITATIONS: lifting, standing,  stairs, transfers, bed mobility, bathing, hygiene/grooming, locomotion level, and caring for others  PARTICIPATION LIMITATIONS: cleaning, laundry, shopping, community activity, and yard work  PERSONAL FACTORS: Age, Fitness, Past/current experiences, and 1-2 comorbidities: Lumbar stenosis, 3 brain aneurysms, kidney transplant in 2022  are also affecting patient's functional outcome.   REHAB POTENTIAL: Good  CLINICAL DECISION MAKING: Evolving/moderate complexity  EVALUATION COMPLEXITY: Moderate  PLAN:  PT FREQUENCY: 2x/week  PT DURATION: 6 weeks  PLANNED INTERVENTIONS: 97164- PT  Re-evaluation, 97110-Therapeutic exercises, 97530- Therapeutic activity, 97112- Neuromuscular re-education, 97535- Self Care, 11914- Manual therapy, [phone]- Gait training, [phone]- Orthotic Fit/training, [phone]- Aquatic Therapy, Patient/Family education, Balance training, Stair training, Dry Needling, Joint mobilization, Vestibular training, and DME instructions  PLAN FOR NEXT SESSION: Goals. How is MRI? Check BP. Add to HEP for improved spinal and R hip mobility, strength. Global endurance and functional strength (lifting), eccentric control, head turns, anticipatory balance, blaze pods, rebounder, rockerboard    Casidy Alberta E Shiniqua Groseclose, PT, DPT 12/15/2022, 1:18 PM

## 2022-12-17 ENCOUNTER — Ambulatory Visit: Payer: Medicare HMO | Admitting: Physical Therapy

## 2022-12-17 VITALS — BP 113/77

## 2022-12-17 DIAGNOSIS — M5459 Other low back pain: Secondary | ICD-10-CM | POA: Diagnosis not present

## 2022-12-17 DIAGNOSIS — R2681 Unsteadiness on feet: Secondary | ICD-10-CM

## 2022-12-17 DIAGNOSIS — R2689 Other abnormalities of gait and mobility: Secondary | ICD-10-CM

## 2022-12-17 DIAGNOSIS — M6281 Muscle weakness (generalized): Secondary | ICD-10-CM

## 2022-12-17 NOTE — Therapy (Signed)
OUTPATIENT PHYSICAL THERAPY NEURO TREATMENT   Patient Name: Cristina Wilson MRN: [id number] DOB:[birth year], 72 y.o., female Today's Date: 12/17/2022   PCP: Merlene Laughter, MD REFERRING PROVIDER: Joaquim Nam, MD  END OF SESSION:  PT End of Session - 12/17/22 1109     Visit Number 9    Number of Visits 13    Date for PT Re-Evaluation 01/14/23    Authorization Type [employer]    PT Start Time 1105   Previous pt session ran late   PT Stop Time 1145    PT Time Calculation (min) 40 min    Equipment Utilized During Treatment Gait belt    Activity Tolerance Patient tolerated treatment well    Behavior During Therapy WFL for tasks assessed/performed              Past Medical History:  Diagnosis Date   Abdominal pain 06/06/2018   Acute renal failure (ARF) (HCC) 06/22/2018   Anemia in chronic kidney disease 08/25/2018   Anemia, unspecified 07/05/2018   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 states she has 2 aneurysms   Chronic low back pain 04/12/2017   Chronic pain syndrome 04/12/2017   Closed fracture of left distal radius    De Quervain's tenosynovitis 02/10/2018   Degeneration of lumbar intervertebral disc 03/01/2017   End stage renal failure on dialysis [facility])    M/W/F on [employer]   GERD (gastroesophageal reflux disease)    Goodpasture syndrome (HCC)    Headache 10/30/2014   Hyperlipidemia 04/17/2018   Hypertension    Hypothyroidism    Iron deficiency anemia, unspecified 07/12/2018   PONV (postoperative nausea and vomiting)    violent vomiting   Sacral back pain 05/25/2016   Scoliosis of lumbar spine 03/01/2017   Secondary hyperparathyroidism of renal origin (HCC) 08/22/2018   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   Thyroid disease    Trigger finger of left thumb 02/10/2018   Past Surgical History:  Procedure Laterality Date    ARTERY BIOPSY Right 10/31/2014   Procedure: BIOPSY TEMPORAL ARTERY-RIGHT;  Surgeon: Pryor Ochoa, MD;  Location: [facility] OR;  Service: Vascular;  Laterality: Right;   AV FISTULA PLACEMENT Left 10/11/2018   Procedure: left arm ARTERIOVENOUS (AV) FISTULA  creation;  Surgeon: Maeola Harman, MD;  Location: [facility] OR;  Service: Vascular;  Laterality: Left;   BILATERAL CARPAL TUNNEL RELEASE     BIOPSY  04/29/2019   Procedure: BIOPSY;  Surgeon: Kathi Der, MD;  Location: [facility]COPY;  Service: Gastroenterology;;   BIOPSY  11/05/2019   Procedure: BIOPSY;  Surgeon: Kathi Der, MD;  Location: [facility];  Service: Gastroenterology;;   BREAST SURGERY Left    lumpectomy   BUNIONECTOMY Right    CHOLECYSTECTOMY N/A 10/13/2016   Procedure: LAPAROSCOPIC CHOLECYSTECTOMY WITH INTRAOPERATIVE CHOLANGIOGRAM;  Surgeon: Manus Rudd, MD;  Location: [facility] OR;  Service: General;  Laterality: N/A;   COLONOSCOPY WITH PROPOFOL N/A 11/05/2019   Procedure: COLONOSCOPY WITH PROPOFOL;  Surgeon: Kathi Der, MD;  Location: [facility];  Service: Gastroenterology;  Laterality: N/A;   ESOPHAGOGASTRODUODENOSCOPY (EGD) WITH PROPOFOL N/A 04/29/2019   Procedure: ESOPHAGOGASTRODUODENOSCOPY (EGD) WITH PROPOFOL;  Surgeon: Kathi Der, MD;  Location: [facility];  Service: Gastroenterology;  Laterality: N/A;   EYE SURGERY     surgery for cross eye   FOOT FRACTURE SURGERY     OPEN REDUCTION INTERNAL  FIXATION (ORIF) DISTAL RADIAL FRACTURE Left 03/29/2017   Procedure: OPEN REDUCTION INTERNAL FIXATION (ORIF)LEFT  DISTAL RADIAL FRACTURE;  Surgeon: Betha Loa, MD;  Location: [facility];  Service: Orthopedics;  Laterality: Left;   POLYPECTOMY  11/05/2019   Procedure: POLYPECTOMY;  Surgeon: Kathi Der, MD;  Location: [facility];  Service: Gastroenterology;;   THYROIDECTOMY     TONSILLECTOMY     WISDOM TOOTH EXTRACTION     Patient Active Problem List   Diagnosis Date Noted    Hypertensive crisis 10/23/2019   History of cerebral aneurysm 10/23/2019   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   Hypoxemia 02/21/2019   Pulmonary edema 03/22/2019   Encounter for immunization 09/26/2018   Anemia in chronic kidney disease 08/25/2018   End stage renal disease (HCC) 08/25/2018   Secondary hyperparathyroidism of renal origin (HCC) 08/22/2018   Referred otalgia of left ear 08/16/2018   Iron deficiency anemia, unspecified 07/12/2018   Shortness of breath 07/06/2018   Anemia, unspecified 07/05/2018   Anxiety 07/05/2018   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   Acute renal failure (ARF) (HCC) 06/22/2018   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   Abdominal pain 06/06/2018   Hydronephrosis due to obstruction of ureteral orifice 06/06/2018   Disease of thyroid gland 04/17/2018   Hyperlipidemia 04/17/2018   Hypertension 04/17/2018   De Quervain's tenosynovitis 02/10/2018   Trigger finger of left thumb 02/10/2018   Chronic low back pain 04/12/2017   Chronic pain syndrome 04/12/2017   Degeneration of lumbar intervertebral disc 03/01/2017   Scoliosis of lumbar spine 03/01/2017   Sacral back pain 05/25/2016   Eye pain 10/30/2014    ONSET DATE: 11/10/2022 (referral)   REFERRING DIAG:  U44.034 (ICD-10-CM) - Spinal stenosis, lumbar region with neurogenic claudication    THERAPY DIAG:  Muscle weakness (generalized)  Other abnormalities of gait and mobility  Unsteadiness on feet  Other low back pain  Rationale for Evaluation and Treatment: Rehabilitation  SUBJECTIVE:                                                                                                                                                                                             SUBJECTIVE STATEMENT: Pt reports doing well. States she feels more balanced and has reduced pain levels since starting PT. Appointment w/Dr. Danielle Dess was cancelled, so pt requesting  to review MRI results   Pt accompanied by: self  PERTINENT HISTORY: hypothyroidism; HTN; HLD; Goodpasture syndrome; cerebral aneurysm; ESRD on TTS HD; and chronic pain, kidney replacement (12/14/2020).   PAIN:  Are you having pain? Yes: NPRS scale: 5-6/10 Pain location: low back Pain  description: aching pain Aggravating factors: "probably this workout"  Relieving factors: Heat/ice   PRECAUTIONS: Fall  RED FLAGS: None   WEIGHT BEARING RESTRICTIONS: No  FALLS: Has patient fallen in last 6 months? Yes. Number of falls %  LIVING ENVIRONMENT: Lives with:  her dog, Snickers  Lives in: House/apartment Stairs: Yes: External: 5 in carport w/bilateral rails and 2 in back w/no rails steps; see previous Has following equipment at home: Single point cane, Walker - 2 wheeled, shower chair, and walking sticks   PLOF: Independent  PATIENT GOALS: "To establish mobility so that I do not fall and I feel confident"   OBJECTIVE:  Note: Objective measures were completed at Evaluation unless otherwise noted.  DIAGNOSTIC FINDINGS: MRI of lumbar spine from 2022   Disc levels:   T12- L1: Unremarkable.   L1-L2: Ventral spondylitic spurring   L2-L3: Disc narrowing and bulging with mild right facet spurring.   L3-L4: Disc narrowing and bulging.  Mild facet spurring   L4-L5: Disc narrowing and bulging with left inferior foraminal protrusion. Asymmetric left facet spurring. Left foraminal stenosis is mild   L5-S1:Disc narrowing and bulging with endplate spurring eccentric to the left. Negative facets.   IMPRESSION: Generalized lumbar spine degeneration similar to 2017. No impingement to explain right leg symptoms.  COGNITION: Overall cognitive status: Within functional limits for tasks assessed   SENSATION: Shooting pain down her RLE that follows sciatic n. Distribution    POSTURE: rounded shoulders and forward head  LOWER EXTREMITY MMT:  Tested in seated position   MMT  Right Eval Left Eval  Hip flexion 4- 4  Hip extension    Hip abduction 5 5  Hip adduction 5 5  Hip internal rotation    Hip external rotation    Knee flexion 4+ 5  Knee extension 5 5  Ankle dorsiflexion 4 5  Ankle plantarflexion    Ankle inversion    Ankle eversion    (Blank rows = not tested)   GAIT: Gait pattern: step through pattern, decreased arm swing- Right, decreased arm swing- Left, decreased stride length, decreased hip/knee flexion- Right, decreased ankle dorsiflexion- Right, lateral hip instability, and wide BOS Distance walked: Various clinic distances  Assistive device utilized: None Level of assistance: SBA Comments: Pt demonstrates waddle-like gait pattern w/very wide BOS. Noted uncompensated trendelenburg on RLE as well.   VITALS Vitals:   12/17/22 1118  BP: 113/77     TODAY'S TREATMENT:      Ther Act  Assessed vitals (see above) and WNL Reviewed cervical and thoracic MRI results from pt's MyChart but encouraged pt to review them w/MD. Pt verbalized understanding.   Ther Ex  Seated DL using blue resistance band, 1x12 and 2x6 reps, for improved posterior chain strength and progression towards proper lifting technique. Pt very challenged by activity, reporting feeling the exercise in her hip adductors, shoulders and core. No pain w/activity.  Sit to stands while holding 15# KB in goblet position, x10 reps, for improved BLE strength and endurance. Noted pt bracing against mat to stabilize, so cued pt for proper body mechanics (anterior lean, scooting hips towards edge of seat and hip-width BOS) and pt able to perform well and reported ease of movement when performing this way. Educated pt on practicing proper sit to stand technique at home to continue working on proper form and BLE strength.  At rebounder, alt fwd step w/2kg ball throw and catch followed by retro step, x10 reps per side for improved anticipatory balance strategies  and step clearance. Pt performed  well w/no LOB and naturally squatted down to catch ball and stabilized independently, SBA throughout.    RPE of >5/10 following session. "I feel kinda wobbly but I am okay"    PATIENT EDUCATION: Education details: continue HEP, proper sit to stand technique  Person educated: Patient Education method: Explanation, Demonstration, and Verbal cues Education comprehension: verbalized understanding and returned demonstration  HOME EXERCISE PROGRAM: Verbally added seated march overs on eval   Access Code: [email or web address] URL: [email or web address] Date: 11/23/2022 Prepared by: Alethia Berthold Tiwanda Threats  Exercises - Seated Piriformis Stretch with Trunk Bend  - 1 x daily - 7 x weekly - 3 sets - 30-45 second  hold - Supine Figure 4 Piriformis Stretch  - 1 x daily - 7 x weekly - 3 sets - 45-60 second hold - Supine Straight Leg Lumbar Rotation Stretch  - 1 x daily - 7 x weekly - 1 sets - 5-10 reps - 10-20 second hold - Seated Hamstring Stretch  - 1 x daily - 7 x weekly - 3 sets - 10 reps - Supine Butterfly Groin Stretch  - 1 x daily - 7 x weekly - 3 sets - 10 reps - Forward Step Down with Heel Tap and Rail Support  - 1 x daily - 7 x weekly - 3 sets - 10 reps - Side Step Down with Counter Support  - 1 x daily - 7 x weekly - 3 sets - 10 reps  GOALS: Goals reviewed with patient? Yes  SHORT TERM GOALS: Target date: 12/17/2022   Pt will be independent with initial  HEP for improved strength, balance, transfers and gait.  Baseline: Goal status: INITIAL  2.  Pt will improve FGA to 18/30 for decreased fall risk   Baseline: 14/30 Goal status: INITIAL  3.  MCTSIB to be assessed and STG/LTG updated  Baseline:  Goal status: DC due to high baseline score   4.  Pt will ambulate greater than or equal to 1625' feet on with RPE of <7/10 for improved cardiovascular endurance and BLE strength.   Baseline: 1583' w/RPE of 8-10/10, no AD (11/25) Goal status: REVISED    LONG TERM GOALS: Target  date: 12/31/2022    Pt will be independent with final HEP for improved strength, balance, transfers and gait.  Baseline:  Goal status: INITIAL  2.  Pt will improve FGA to 22/30 for decreased fall risk   Baseline: 14/30 Goal status: REVISED   3.  MCTSIB goal  Baseline:  Goal status: DC due to high baseline score   4.  Pt will ambulate greater than or equal to 1700 feet on with <5/10 RPE for improved cardiovascular endurance and BLE strength.   Baseline: 1583' no AD RPE of 8-10/10 Goal status: REVISED    ASSESSMENT:  CLINICAL IMPRESSION: Emphasis of skilled PT session on posterior chain strength, endurance and anticipatory balance strategies. Pt did receive her MRI result but has not talked to Dr. Danielle Dess, so did briefly review results w/pt but encouraged her to call Dr. Verlee Rossetti office too. Pt continues to be limited by decreased functional BLE strength, core weakness and low back pain, but has improved since eval. Pt reports she does feel more balanced and has reduced pain levels since starting PT. Continue POC.    OBJECTIVE IMPAIRMENTS: Abnormal gait, decreased activity tolerance, decreased balance, decreased endurance, decreased mobility, difficulty walking, decreased ROM, decreased strength, impaired flexibility, impaired sensation, postural dysfunction, and pain  ACTIVITY LIMITATIONS: lifting, standing,  stairs, transfers, bed mobility, bathing, hygiene/grooming, locomotion level, and caring for others  PARTICIPATION LIMITATIONS: cleaning, laundry, shopping, community activity, and yard work  PERSONAL FACTORS: Age, Fitness, Past/current experiences, and 1-2 comorbidities: Lumbar stenosis, 3 brain aneurysms, kidney transplant in 2022  are also affecting patient's functional outcome.   REHAB POTENTIAL: Good  CLINICAL DECISION MAKING: Evolving/moderate complexity  EVALUATION COMPLEXITY: Moderate  PLAN:  PT FREQUENCY: 2x/week  PT DURATION: 6 weeks  PLANNED  INTERVENTIONS: 97164- PT Re-evaluation, 97110-Therapeutic exercises, 97530- Therapeutic activity, 97112- Neuromuscular re-education, 97535- Self Care, 02725- Manual therapy, [phone]- Gait training, [phone]- Orthotic Fit/training, [phone]- Aquatic Therapy, Patient/Family education, Balance training, Stair training, Dry Needling, Joint mobilization, Vestibular training, and DME instructions  PLAN FOR NEXT SESSION: 10th visit PN. Goals. How is MRI? Check BP. Add to HEP for improved spinal and R hip mobility, strength. Global endurance and functional strength (lifting), eccentric control, head turns, anticipatory balance, blaze pods, rebounder, rockerboard    Zamar Odwyer E Myangel Summons, PT, DPT 12/17/2022, 11:58 AM

## 2022-12-21 ENCOUNTER — Ambulatory Visit: Payer: Medicare HMO | Admitting: Physical Therapy

## 2022-12-21 VITALS — BP 108/69 | HR 74

## 2022-12-21 DIAGNOSIS — R2689 Other abnormalities of gait and mobility: Secondary | ICD-10-CM

## 2022-12-21 DIAGNOSIS — R2681 Unsteadiness on feet: Secondary | ICD-10-CM

## 2022-12-21 DIAGNOSIS — M6281 Muscle weakness (generalized): Secondary | ICD-10-CM | POA: Diagnosis not present

## 2022-12-21 DIAGNOSIS — M5459 Other low back pain: Secondary | ICD-10-CM

## 2022-12-21 NOTE — Therapy (Signed)
OUTPATIENT PHYSICAL THERAPY NEURO TREATMENT- 10TH VISIT PROGRESS NOTE   Patient Name: Cristina Wilson MRN: [id number] DOB:31-Jan-1950, 72 y.o., female Today's Date: 12/21/2022   PCP: Merlene Laughter, MD REFERRING PROVIDER: Joaquim Nam, MD  Physical Therapy Progress Note   Dates of Reporting Period:11/19/22 - 12/21/22  See Note below for Objective Data and Assessment of Progress/Goals.    END OF SESSION:  PT End of Session - 12/21/22 1022     Visit Number 10    Number of Visits 13    Date for PT Re-Evaluation 01/14/23    Authorization Type [employer]    PT Start Time 1019    PT Stop Time 1059    PT Time Calculation (min) 40 min    Equipment Utilized During Treatment Gait belt    Activity Tolerance Patient tolerated treatment well    Behavior During Therapy WFL for tasks assessed/performed              Past Medical History:  Diagnosis Date   Abdominal pain 06/06/2018   Acute renal failure (ARF) (HCC) 06/22/2018   Anemia in chronic kidney disease 08/25/2018   Anemia, unspecified 07/05/2018   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 states she has 2 aneurysms   Chronic low back pain 04/12/2017   Chronic pain syndrome 04/12/2017   Closed fracture of left distal radius    De Quervain's tenosynovitis 02/10/2018   Degeneration of lumbar intervertebral disc 03/01/2017   End stage renal failure on dialysis [facility])    M/W/F on [employer]   GERD (gastroesophageal reflux disease)    Goodpasture syndrome (HCC)    Headache 10/30/2014   Hyperlipidemia 04/17/2018   Hypertension    Hypothyroidism    Iron deficiency anemia, unspecified 07/12/2018   PONV (postoperative nausea and vomiting)    violent vomiting   Sacral back pain 05/25/2016   Scoliosis of lumbar spine 03/01/2017   Secondary hyperparathyroidism of renal origin (HCC) 08/22/2018    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   Thyroid disease    Trigger finger of left thumb 02/10/2018   Past Surgical History:  Procedure Laterality Date   ARTERY BIOPSY Right 10/31/2014   Procedure: BIOPSY TEMPORAL ARTERY-RIGHT;  Surgeon: Pryor Ochoa, MD;  Location: [facility] OR;  Service: Vascular;  Laterality: Right;   AV FISTULA PLACEMENT Left 10/11/2018   Procedure: left arm ARTERIOVENOUS (AV) FISTULA  creation;  Surgeon: Maeola Harman, MD;  Location: [facility] OR;  Service: Vascular;  Laterality: Left;   BILATERAL CARPAL TUNNEL RELEASE     BIOPSY  04/29/2019   Procedure: BIOPSY;  Surgeon: Kathi Der, MD;  Location: [facility] ENDOSCOPY;  Service: Gastroenterology;;   BIOPSY  11/05/2019   Procedure: BIOPSY;  Surgeon: Kathi Der, MD;  Location: [facility] ENDOSCOPY;  Service: Gastroenterology;;   BREAST SURGERY Left    lumpectomy   BUNIONECTOMY Right    CHOLECYSTECTOMY N/A 10/13/2016   Procedure: LAPAROSCOPIC CHOLECYSTECTOMY WITH INTRAOPERATIVE CHOLANGIOGRAM;  Surgeon: Manus Rudd, MD;  Location: [facility] OR;  Service: General;  Laterality: N/A;   COLONOSCOPY WITH PROPOFOL N/A 11/05/2019   Procedure: COLONOSCOPY WITH PROPOFOL;  Surgeon: Kathi Der, MD;  Location: [facility] ENDOSCOPY;  Service: Gastroenterology;  Laterality: N/A;   ESOPHAGOGASTRODUODENOSCOPY (EGD) WITH PROPOFOL N/A 04/29/2019   Procedure: ESOPHAGOGASTRODUODENOSCOPY (EGD) WITH PROPOFOL;  Surgeon: Kathi Der, MD;  Location: [facility] ENDOSCOPY;  Service: Gastroenterology;  Laterality: N/A;  EYE SURGERY     surgery for cross eye   FOOT FRACTURE SURGERY     OPEN REDUCTION INTERNAL FIXATION (ORIF) DISTAL RADIAL FRACTURE Left 03/29/2017   Procedure: OPEN REDUCTION INTERNAL FIXATION (ORIF)LEFT  DISTAL RADIAL FRACTURE;  Surgeon: Betha Loa, MD;  Location: [facility];  Service: Orthopedics;  Laterality: Left;   POLYPECTOMY  11/05/2019   Procedure: POLYPECTOMY;  Surgeon: Kathi Der, MD;  Location: [facility]OSCOPY;   Service: Gastroenterology;;   THYROIDECTOMY     TONSILLECTOMY     WISDOM TOOTH EXTRACTION     Patient Active Problem List   Diagnosis Date Noted   Hypertensive crisis 10/23/2019   History of cerebral aneurysm 10/23/2019   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   Hypoxemia 02/21/2019   Pulmonary edema 03/16/2019   Encounter for immunization 09/26/2018   Anemia in chronic kidney disease 08/25/2018   End stage renal disease (HCC) 08/25/2018   Secondary hyperparathyroidism of renal origin (HCC) 08/22/2018   Referred otalgia of left ear 08/16/2018   Iron deficiency anemia, unspecified 07/12/2018   Shortness of breath 07/06/2018   Anemia, unspecified 07/05/2018   Anxiety 07/05/2018   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   Acute renal failure (ARF) (HCC) 06/22/2018   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   Abdominal pain 06/06/2018   Hydronephrosis due to obstruction of ureteral orifice 06/06/2018   Disease of thyroid gland 04/17/2018   Hyperlipidemia 04/17/2018   Hypertension 04/17/2018   De Quervain's tenosynovitis 02/10/2018   Trigger finger of left thumb 02/10/2018   Chronic low back pain 04/12/2017   Chronic pain syndrome 04/12/2017   Degeneration of lumbar intervertebral disc 03/01/2017   Scoliosis of lumbar spine 03/01/2017   Sacral back pain 05/25/2016   Eye pain 10/30/2014    ONSET DATE: 11/10/2022 (referral)   REFERRING DIAG:  J62.831 (ICD-10-CM) - Spinal stenosis, lumbar region with neurogenic claudication    THERAPY DIAG:  Muscle weakness (generalized)  Other abnormalities of gait and mobility  Unsteadiness on feet  Other low back pain  Rationale for Evaluation and Treatment: Rehabilitation  SUBJECTIVE:                                                                                                                                                                                             SUBJECTIVE STATEMENT: Pt  received wandering around parking lot. Reports it is a nice day and she wanted to walk. Pain is better today, rating as a 5/10. Is feeling a lot more stable with her walking and seems more balanced.   Pt accompanied by: self  PERTINENT HISTORY: hypothyroidism; HTN; HLD;  Goodpasture syndrome; cerebral aneurysm; ESRD on TTS HD; and chronic pain, kidney replacement (12/14/2020).   PAIN:  Are you having pain? Yes: NPRS scale: 5/10 Pain location: low back Pain description: aching pain Aggravating factors: "probably this workout"  Relieving factors: Heat/ice   PRECAUTIONS: Fall  RED FLAGS: None   WEIGHT BEARING RESTRICTIONS: No  FALLS: Has patient fallen in last 6 months? Yes. Number of falls %  LIVING ENVIRONMENT: Lives with:  her dog, Snickers  Lives in: House/apartment Stairs: Yes: External: 5 in carport w/bilateral rails and 2 in back w/no rails steps; see previous Has following equipment at home: Single point cane, Walker - 2 wheeled, shower chair, and walking sticks   PLOF: Independent  PATIENT GOALS: "To establish mobility so that I do not fall and I feel confident"   OBJECTIVE:  Note: Objective measures were completed at Evaluation unless otherwise noted.  DIAGNOSTIC FINDINGS: MRI of lumbar spine from 2022   Disc levels:   T12- L1: Unremarkable.   L1-L2: Ventral spondylitic spurring   L2-L3: Disc narrowing and bulging with mild right facet spurring.   L3-L4: Disc narrowing and bulging.  Mild facet spurring   L4-L5: Disc narrowing and bulging with left inferior foraminal protrusion. Asymmetric left facet spurring. Left foraminal stenosis is mild   L5-S1:Disc narrowing and bulging with endplate spurring eccentric to the left. Negative facets.   IMPRESSION: Generalized lumbar spine degeneration similar to 2017. No impingement to explain right leg symptoms.  COGNITION: Overall cognitive status: Within functional limits for tasks  assessed   SENSATION: Shooting pain down her RLE that follows sciatic n. Distribution    POSTURE: rounded shoulders and forward head  LOWER EXTREMITY MMT:  Tested in seated position   MMT Right Eval Left Eval  Hip flexion 4- 4  Hip extension    Hip abduction 5 5  Hip adduction 5 5  Hip internal rotation    Hip external rotation    Knee flexion 4+ 5  Knee extension 5 5  Ankle dorsiflexion 4 5  Ankle plantarflexion    Ankle inversion    Ankle eversion    (Blank rows = not tested)   GAIT: Gait pattern:  Decreased eccentric control of LLE, step through pattern, decreased hip/knee flexion- Right, decreased ankle dorsiflexion- Right, lateral hip instability, and lateral lean- Left Distance walked: Various clinic distances  Assistive device utilized: None Level of assistance: SBA Comments: Pt demonstrates improved gait kinematics but continues to be limited by poor eccentric control of LLE w/fatigue and L lateral lean   VITALS Vitals:   12/21/22 1026  BP: 108/69  Pulse: 74      TODAY'S TREATMENT:      Ther Act  Assessed vitals (see above) and WNL  STG Assessment    [facility] PT Assessment - 12/21/22 1028       Functional Gait  Assessment   Gait assessed  Yes    Gait Level Surface Walks 20 ft in less than 5.5 sec, no assistive devices, good speed, no evidence for imbalance, normal gait pattern, deviates no more than 6 in outside of the 12 in walkway width.   5.22   Change in Gait Speed Able to smoothly change walking speed without loss of balance or gait deviation. Deviate no more than 6 in outside of the 12 in walkway width.    Gait with Horizontal Head Turns Performs head turns smoothly with no change in gait. Deviates no more than 6 in outside 12 in walkway width  Gait with Vertical Head Turns Performs head turns with no change in gait. Deviates no more than 6 in outside 12 in walkway width.    Gait and Pivot Turn Pivot turns safely within 3 sec and stops quickly  with no loss of balance.    Step Over Obstacle Is able to step over 2 stacked shoe boxes taped together (9 in total height) without changing gait speed. No evidence of imbalance.    Gait with Narrow Base of Support Is able to ambulate for 10 steps heel to toe with no staggering.    Gait with Eyes Closed Walks 20 ft, slow speed, abnormal gait pattern, evidence for imbalance, deviates 10-15 in outside 12 in walkway width. Requires more than 9 sec to ambulate 20 ft.    Ambulating Backwards Walks 20 ft, no assistive devices, good speed, no evidence for imbalance, normal gait    Steps Alternating feet, must use rail.    Total Score 27    FGA comment: Low fall risk             Gait pattern: step through pattern, lateral hip instability, and lateral lean- Left Distance walked: 115' loop completed 13 x+ 105' = 1600' Assistive device utilized: None Level of assistance: SBA Comments: Pt performed two "fast laps" and one "slow lap" throughout. RPE of 7-8/10 following activity. Increased L lateral lean w/fatigue but no major LOB noted. All turns to L side   Ther Ex On mat table, quadruped bird dogs, x6 per side, for improved functional core stability and posterior chain strength. Pt much more stable this date compared to previous session, demonstrating ability to stabilize independently w/no compensation strategies or LOB noted.  At wall, standing scapular retraction w/chin tuck holds, 2x30s, for improved cervical ROM and postural control. Pt reported pain in rhomboids/lower trap w/movement, so will continue to work on in therapy.   PATIENT EDUCATION: Education details: continue HEP, proper sit to stand technique  Person educated: Patient Education method: Explanation, Demonstration, and Verbal cues Education comprehension: verbalized understanding and returned demonstration  HOME EXERCISE PROGRAM: Verbally added seated march overs on eval   Access Code: [email or web address] URL:  [email or web address] Date: 11/23/2022 Prepared by: Alethia Berthold Donalee Gaumond  Exercises - Seated Piriformis Stretch with Trunk Bend  - 1 x daily - 7 x weekly - 3 sets - 30-45 second  hold - Supine Figure 4 Piriformis Stretch  - 1 x daily - 7 x weekly - 3 sets - 45-60 second hold - Supine Straight Leg Lumbar Rotation Stretch  - 1 x daily - 7 x weekly - 1 sets - 5-10 reps - 10-20 second hold - Seated Hamstring Stretch  - 1 x daily - 7 x weekly - 3 sets - 10 reps - Supine Butterfly Groin Stretch  - 1 x daily - 7 x weekly - 3 sets - 10 reps - Forward Step Down with Heel Tap and Rail Support  - 1 x daily - 7 x weekly - 3 sets - 10 reps - Side Step Down with Counter Support  - 1 x daily - 7 x weekly - 3 sets - 10 reps  GOALS: Goals reviewed with patient? Yes  SHORT TERM GOALS: Target date: 12/17/2022   Pt will be independent with initial  HEP for improved strength, balance, transfers and gait.  Baseline: Goal status: MET  2.  Pt will improve FGA to 18/30 for decreased fall risk   Baseline: 14/30; 27/30  Goal status: MET  3.  MCTSIB to be assessed and STG/LTG updated  Baseline:  Goal status: DC due to high baseline score   4.  Pt will ambulate greater than or equal to 1625' feet on with RPE of <7/10 for improved cardiovascular endurance and BLE strength.   Baseline: 1583' w/RPE of 8-10/10, no AD (11/25); 1600' w/RPE of 7-8/10 no AD  Goal status: PARTIALLY MET     LONG TERM GOALS: Target date: 12/31/2022    Pt will be independent with final HEP for improved strength, balance, transfers and gait.  Baseline:  Goal status: INITIAL  2.  Pt will improve FGA to 22/30 for decreased fall risk   Baseline: 14/30; 27/30  Goal status: MET   3.  MCTSIB goal  Baseline:  Goal status: DC due to high baseline score   4.  Pt will ambulate greater than or equal to 1700 feet on with <5/10 RPE for improved cardiovascular endurance and BLE strength.   Baseline: 1583' no AD  RPE of 8-10/10 Goal status: REVISED    ASSESSMENT:  CLINICAL IMPRESSION: Emphasis of skilled PT session on STG assessment, posterior chain strength and improved cervical ROM. Pt has met 3 of 4 STGs and partially met 1 of 4. Pt is independent w/HEP and has significantly improved her dynamic balance, as noted by large improvement on FGA. Pt improved her score on FGA from a 14 to 27 out of 30, w/most notable improvements being seen in gait w/head turns, narrow BOS and anticipatory balance strategies. Pt did improve her quality of gait on and narrowly missed her distance goal, so consider this goal partially met. Pt continues to be limited by chronic neck and low back pain as well as RLE weakness, but overall has significantly reduced her fall frequency and improved her functional mobility over past month. Pt will continue to benefit from skilled PT to maintain independence and reduce pain levels. Continue POC.    OBJECTIVE IMPAIRMENTS: Abnormal gait, decreased activity tolerance, decreased balance, decreased endurance, decreased mobility, difficulty walking, decreased ROM, decreased strength, impaired flexibility, impaired sensation, postural dysfunction, and pain  ACTIVITY LIMITATIONS: lifting, standing, stairs, transfers, bed mobility, bathing, hygiene/grooming, locomotion level, and caring for others  PARTICIPATION LIMITATIONS: cleaning, laundry, shopping, community activity, and yard work  PERSONAL FACTORS: Age, Fitness, Past/current experiences, and 1-2 comorbidities: Lumbar stenosis, 3 brain aneurysms, kidney transplant in 2022  are also affecting patient's functional outcome.   REHAB POTENTIAL: Good  CLINICAL DECISION MAKING: Evolving/moderate complexity  EVALUATION COMPLEXITY: Moderate  PLAN:  PT FREQUENCY: 2x/week  PT DURATION: 6 weeks  PLANNED INTERVENTIONS: 97164- PT Re-evaluation, 97110-Therapeutic exercises, 97530- Therapeutic activity, 97112- Neuromuscular re-education,  97535- Self Care, 21308- Manual therapy, [phone]- Gait training, [phone]- Orthotic Fit/training, [phone]- Aquatic Therapy, Patient/Family education, Balance training, Stair training, Dry Needling, Joint mobilization, Vestibular training, and DME instructions  PLAN FOR NEXT SESSION:  How is MRI? Check BP. Add to HEP for improved spinal and R hip mobility, strength. Global endurance and functional strength (lifting), eccentric control, head turns, anticipatory balance, blaze pods, rebounder, rockerboard, scapular retraction    Ulonda Klosowski E Corona Popovich, PT, DPT 12/21/2022, 11:04 AM

## 2022-12-22 DIAGNOSIS — D485 Neoplasm of uncertain behavior of skin: Secondary | ICD-10-CM | POA: Diagnosis not present

## 2022-12-22 DIAGNOSIS — G959 Disease of spinal cord, unspecified: Secondary | ICD-10-CM | POA: Diagnosis not present

## 2022-12-22 DIAGNOSIS — D040 Carcinoma in situ of skin of lip: Secondary | ICD-10-CM | POA: Diagnosis not present

## 2022-12-23 ENCOUNTER — Ambulatory Visit: Payer: Medicare HMO | Admitting: Physical Therapy

## 2022-12-23 VITALS — BP 126/65 | HR 60

## 2022-12-23 DIAGNOSIS — M6281 Muscle weakness (generalized): Secondary | ICD-10-CM | POA: Diagnosis not present

## 2022-12-23 DIAGNOSIS — M5459 Other low back pain: Secondary | ICD-10-CM | POA: Diagnosis not present

## 2022-12-23 DIAGNOSIS — R2689 Other abnormalities of gait and mobility: Secondary | ICD-10-CM

## 2022-12-23 DIAGNOSIS — R2681 Unsteadiness on feet: Secondary | ICD-10-CM

## 2022-12-23 NOTE — Therapy (Signed)
OUTPATIENT PHYSICAL THERAPY NEURO TREATMENT   Patient Name: Cristina Wilson MRN: [id number] DOB:Mar 20, 1950, 72 y.o., female Today's Date: 12/23/2022   PCP: Merlene Laughter, MD REFERRING PROVIDER: Joaquim Nam, MD     END OF SESSION:  PT End of Session - 12/23/22 1022     Visit Number 11    Number of Visits 13    Date for PT Re-Evaluation 01/14/23    Authorization Type [employer]    PT Start Time 1019    PT Stop Time 1102    PT Time Calculation (min) 43 min    Equipment Utilized During Treatment Gait belt    Activity Tolerance Patient tolerated treatment well    Behavior During Therapy WFL for tasks assessed/performed              Past Medical History:  Diagnosis Date   Abdominal pain 06/06/2018   Acute renal failure (ARF) (HCC) 06/22/2018   Anemia in chronic kidney disease 08/25/2018   Anemia, unspecified 07/05/2018   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 states she has 2 aneurysms   Chronic low back pain 04/12/2017   Chronic pain syndrome 04/12/2017   Closed fracture of left distal radius    De Quervain's tenosynovitis 02/10/2018   Degeneration of lumbar intervertebral disc 03/01/2017   End stage renal failure on dialysis [facility])    M/W/F on [employer]   GERD (gastroesophageal reflux disease)    Goodpasture syndrome (HCC)    Headache 10/30/2014   Hyperlipidemia 04/17/2018   Hypertension    Hypothyroidism    Iron deficiency anemia, unspecified 07/12/2018   PONV (postoperative nausea and vomiting)    violent vomiting   Sacral back pain 05/25/2016   Scoliosis of lumbar spine 03/01/2017   Secondary hyperparathyroidism of renal origin (HCC) 08/22/2018   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   Thyroid disease    Trigger finger of left thumb 02/10/2018   Past Surgical History:  Procedure Laterality Date   ARTERY BIOPSY Right  10/31/2014   Procedure: BIOPSY TEMPORAL ARTERY-RIGHT;  Surgeon: Pryor Ochoa, MD;  Location: [facility] OR;  Service: Vascular;  Laterality: Right;   AV FISTULA PLACEMENT Left 10/11/2018   Procedure: left arm ARTERIOVENOUS (AV) FISTULA  creation;  Surgeon: Maeola Harman, MD;  Location: [facility] OR;  Service: Vascular;  Laterality: Left;   BILATERAL CARPAL TUNNEL RELEASE     BIOPSY  04/29/2019   Procedure: BIOPSY;  Surgeon: Kathi Der, MD;  Location: [facility];  Service: Gastroenterology;;   BIOPSY  11/05/2019   Procedure: BIOPSY;  Surgeon: Kathi Der, MD;  Location: [facility];  Service: Gastroenterology;;   BREAST SURGERY Left    lumpectomy   BUNIONECTOMY Right    CHOLECYSTECTOMY N/A 10/13/2016   Procedure: LAPAROSCOPIC CHOLECYSTECTOMY WITH INTRAOPERATIVE CHOLANGIOGRAM;  Surgeon: Manus Rudd, MD;  Location: [facility] OR;  Service: General;  Laterality: N/A;   COLONOSCOPY WITH PROPOFOL N/A 11/05/2019   Procedure: COLONOSCOPY WITH PROPOFOL;  Surgeon: Kathi Der, MD;  Location: [facility];  Service: Gastroenterology;  Laterality: N/A;   ESOPHAGOGASTRODUODENOSCOPY (EGD) WITH PROPOFOL N/A 04/29/2019   Procedure: ESOPHAGOGASTRODUODENOSCOPY (EGD) WITH PROPOFOL;  Surgeon: Kathi Der, MD;  Location: [facility];  Service: Gastroenterology;  Laterality: N/A;   EYE SURGERY     surgery for cross eye   FOOT FRACTURE SURGERY     OPEN REDUCTION INTERNAL FIXATION (ORIF) DISTAL  RADIAL FRACTURE Left 03/29/2017   Procedure: OPEN REDUCTION INTERNAL FIXATION (ORIF)LEFT  DISTAL RADIAL FRACTURE;  Surgeon: Betha Loa, MD;  Location: [facility];  Service: Orthopedics;  Laterality: Left;   POLYPECTOMY  11/05/2019   Procedure: POLYPECTOMY;  Surgeon: Kathi Der, MD;  Location: [facility];  Service: Gastroenterology;;   THYROIDECTOMY     TONSILLECTOMY     WISDOM TOOTH EXTRACTION     Patient Active Problem List   Diagnosis Date Noted   Hypertensive crisis  10/23/2019   History of cerebral aneurysm 10/23/2019   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   Hypoxemia 02/21/2019   Pulmonary edema 03-15-19   Encounter for immunization 09/26/2018   Anemia in chronic kidney disease 08/25/2018   End stage renal disease (HCC) 08/25/2018   Secondary hyperparathyroidism of renal origin (HCC) 08/22/2018   Referred otalgia of left ear 08/16/2018   Iron deficiency anemia, unspecified 07/12/2018   Shortness of breath 07/06/2018   Anemia, unspecified 07/05/2018   Anxiety 07/05/2018   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   Acute renal failure (ARF) (HCC) 06/22/2018   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   Abdominal pain 06/06/2018   Hydronephrosis due to obstruction of ureteral orifice 06/06/2018   Disease of thyroid gland 04/17/2018   Hyperlipidemia 04/17/2018   Hypertension 04/17/2018   De Quervain's tenosynovitis 02/10/2018   Trigger finger of left thumb 02/10/2018   Chronic low back pain 04/12/2017   Chronic pain syndrome 04/12/2017   Degeneration of lumbar intervertebral disc 03/01/2017   Scoliosis of lumbar spine 03/01/2017   Sacral back pain 05/25/2016   Eye pain 10/30/2014    ONSET DATE: 11/10/2022 (referral)   REFERRING DIAG:  Z61.096 (ICD-10-CM) - Spinal stenosis, lumbar region with neurogenic claudication    THERAPY DIAG:  Muscle weakness (generalized)  Other abnormalities of gait and mobility  Unsteadiness on feet  Other low back pain  Rationale for Evaluation and Treatment: Rehabilitation  SUBJECTIVE:                                                                                                                                                                                             SUBJECTIVE STATEMENT: Pt reports she spoke to Dr. Danielle Dess yesterday and she will need to have surgery on her neck. Will likely be scheduled early next year. Denies pain today but still has the burning  of her RLE. Denies falls.   Pt accompanied by: self  PERTINENT HISTORY: hypothyroidism; HTN; HLD; Goodpasture syndrome; cerebral aneurysm; ESRD on TTS HD; and chronic pain, kidney replacement (12/14/2020).   PAIN:  Are you having pain? No  PRECAUTIONS:  Cervical and Fall  RED FLAGS: None   WEIGHT BEARING RESTRICTIONS: No  FALLS: Has patient fallen in last 6 months? Yes. Number of falls %  LIVING ENVIRONMENT: Lives with:  her dog, Snickers  Lives in: House/apartment Stairs: Yes: External: 5 in carport w/bilateral rails and 2 in back w/no rails steps; see previous Has following equipment at home: Single point cane, Walker - 2 wheeled, shower chair, and walking sticks   PLOF: Independent  PATIENT GOALS: "To establish mobility so that I do not fall and I feel confident"   OBJECTIVE:  Note: Objective measures were completed at Evaluation unless otherwise noted.  DIAGNOSTIC FINDINGS: MRI of lumbar spine from 2022   Disc levels:   T12- L1: Unremarkable.   L1-L2: Ventral spondylitic spurring   L2-L3: Disc narrowing and bulging with mild right facet spurring.   L3-L4: Disc narrowing and bulging.  Mild facet spurring   L4-L5: Disc narrowing and bulging with left inferior foraminal protrusion. Asymmetric left facet spurring. Left foraminal stenosis is mild   L5-S1:Disc narrowing and bulging with endplate spurring eccentric to the left. Negative facets.   IMPRESSION: Generalized lumbar spine degeneration similar to 2017. No impingement to explain right leg symptoms.  COGNITION: Overall cognitive status: Within functional limits for tasks assessed   SENSATION: Shooting pain down her RLE that follows sciatic n. Distribution    POSTURE: rounded shoulders and forward head  LOWER EXTREMITY MMT:  Tested in seated position   MMT Right Eval Left Eval  Hip flexion 4- 4  Hip extension    Hip abduction 5 5  Hip adduction 5 5  Hip internal rotation    Hip external  rotation    Knee flexion 4+ 5  Knee extension 5 5  Ankle dorsiflexion 4 5  Ankle plantarflexion    Ankle inversion    Ankle eversion    (Blank rows = not tested)   GAIT: Gait pattern:  Decreased eccentric control of LLE, step through pattern, decreased hip/knee flexion- Right, decreased ankle dorsiflexion- Right, lateral hip instability, and lateral lean- Left Distance walked: Various clinic distances  Assistive device utilized: None Level of assistance: SBA Comments: Pt demonstrates improved gait kinematics but continues to be limited by poor eccentric control of LLE w/fatigue and L lateral lean   VITALS Vitals:   12/23/22 1028 12/23/22 1030  BP: (!) 137/38 126/65  Pulse: 61 60       TODAY'S TREATMENT:      Ther Act  Assessed vitals (see above) and diastolic BP initially low but reassessed and WNL. Pt asymptomatic so attribute low diastolic to machine error.   Floor Recovery: Patient educated in floor recovery this visit using teach-back for injury assessment and sequencing of task in clinic setting.  Discussion of transfer of skills to variable scenarios outside the clinic. Pt performed stand > supine on floor mat w/distant SBA and no instability noted. Pt dizzy w/sit to supine and supine to sit transition that did dissipate after ~30s. Pt performed quadruped > stand w/o UE support slowly but noted good stability. Educated pt on how to perform w/BUE support on table/chair and pt verbalized understanding.   Ther Ex  Staggered sit to stands w/2" box under single foot, x10 reps per side while holding 12# KB at chest and x15 reps w/BW only. Pt most challenged when LLE on step and reported discomfort in low back w/increased weight shift to R side. Pt required wide BOS to perform due to BLE weakness but no LOB  throughout activity.  Seated rows w/green resistance band, x18 reps, for improved periscapular and grip strength. Min cues to reduce shoulder shrug compensation and proper elbow  positioning. Pt reported feeling movement more in forearms and biceps than periscapular musculature but no pain reported.  Alt fwd step w/4# med ball slam followed by retro step, x10 reps per side, for improved core stability, BUE strength, lateral weight shifting and step clearance. Pt very challenged by this and demonstrated sliding of R foot to step backwards due to improper weight shift and anterior truncal lean. Mod multimodal cues to facilitate upright posture and lateral weight shift and pt able to perform 1-2 retro steps to return to midline w/RLE. CGA-min A throughout due to anterior LOB when performing retro stepping.    PATIENT EDUCATION: Education details: continue HEP, plan to DC when she needs cervical surgery and will obtain new referral to resume PT once she is cleared  Person educated: Patient Education method: Explanation Education comprehension: verbalized understanding  HOME EXERCISE PROGRAM: Verbally added seated march overs on eval   Access Code: [email or web address] URL: [email or web address] Date: 11/23/2022 Prepared by: Alethia Berthold Kaige Whistler  Exercises - Seated Piriformis Stretch with Trunk Bend  - 1 x daily - 7 x weekly - 3 sets - 30-45 second  hold - Supine Figure 4 Piriformis Stretch  - 1 x daily - 7 x weekly - 3 sets - 45-60 second hold - Supine Straight Leg Lumbar Rotation Stretch  - 1 x daily - 7 x weekly - 1 sets - 5-10 reps - 10-20 second hold - Seated Hamstring Stretch  - 1 x daily - 7 x weekly - 3 sets - 10 reps - Supine Butterfly Groin Stretch  - 1 x daily - 7 x weekly - 3 sets - 10 reps - Forward Step Down with Heel Tap and Rail Support  - 1 x daily - 7 x weekly - 3 sets - 10 reps - Side Step Down with Counter Support  - 1 x daily - 7 x weekly - 3 sets - 10 reps  GOALS: Goals reviewed with patient? Yes  SHORT TERM GOALS: Target date: 12/17/2022   Pt will be independent with initial  HEP for improved strength, balance, transfers and  gait.  Baseline: Goal status: MET  2.  Pt will improve FGA to 18/30 for decreased fall risk   Baseline: 14/30; 27/30  Goal status: MET  3.  MCTSIB to be assessed and STG/LTG updated  Baseline:  Goal status: DC due to high baseline score   4.  Pt will ambulate greater than or equal to 1625' feet on with RPE of <7/10 for improved cardiovascular endurance and BLE strength.   Baseline: 1583' w/RPE of 8-10/10, no AD (11/25); 1600' w/RPE of 7-8/10 no AD  Goal status: PARTIALLY MET     LONG TERM GOALS: Target date: 12/31/2022    Pt will be independent with final HEP for improved strength, balance, transfers and gait.  Baseline:  Goal status: INITIAL  2.  Pt will improve FGA to 22/30 for decreased fall risk   Baseline: 14/30; 27/30  Goal status: MET   3.  MCTSIB goal  Baseline:  Goal status: DC due to high baseline score   4.  Pt will ambulate greater than or equal to 1700 feet on with <5/10 RPE for improved cardiovascular endurance and BLE strength.   Baseline: 1583' no AD RPE of 8-10/10 Goal status: REVISED    ASSESSMENT:  CLINICAL IMPRESSION: Emphasis  of skilled PT session on periscapular strength, increased step clearance/length, BLE strength and global endurance. Pt tolerated session well but did report discomfort in low back w/staggered sit to stands due to decreased tolerance with weight shift to R side. Pt very challenged by slam ball activity, particularly due to retro step component, so will continue to work on this in sessions. Pt to have cervical spine surgery soon, pt does not have scheduled time yet. Will implement cervical precautions into sessions from now on. Continue POC.    OBJECTIVE IMPAIRMENTS: Abnormal gait, decreased activity tolerance, decreased balance, decreased endurance, decreased mobility, difficulty walking, decreased ROM, decreased strength, impaired flexibility, impaired sensation, postural dysfunction, and pain  ACTIVITY  LIMITATIONS: lifting, standing, stairs, transfers, bed mobility, bathing, hygiene/grooming, locomotion level, and caring for others  PARTICIPATION LIMITATIONS: cleaning, laundry, shopping, community activity, and yard work  PERSONAL FACTORS: Age, Fitness, Past/current experiences, and 1-2 comorbidities: Lumbar stenosis, 3 brain aneurysms, kidney transplant in 2022  are also affecting patient's functional outcome.   REHAB POTENTIAL: Good  CLINICAL DECISION MAKING: Evolving/moderate complexity  EVALUATION COMPLEXITY: Moderate  PLAN:  PT FREQUENCY: 2x/week  PT DURATION: 6 weeks  PLANNED INTERVENTIONS: 97164- PT Re-evaluation, 97110-Therapeutic exercises, 97530- Therapeutic activity, 97112- Neuromuscular re-education, 97535- Self Care, 16109- Manual therapy, [phone]- Gait training, [phone]- Orthotic Fit/training, [phone]- Aquatic Therapy, Patient/Family education, Balance training, Stair training, Dry Needling, Joint mobilization, Vestibular training, and DME instructions  PLAN FOR NEXT SESSION:  Goals and update date or recert. Check BP. Add to HEP for improved spinal and R hip mobility, strength. Global endurance and functional strength (lifting), eccentric control, head turns, anticipatory balance, blaze pods, rebounder, rockerboard, scapular retraction, slam balls, scap push ups    Danaya Geddis E Katelyne Galster, PT, DPT 12/23/2022, 11:05 AM

## 2023-01-04 ENCOUNTER — Ambulatory Visit: Payer: Medicare HMO | Admitting: Physical Therapy

## 2023-01-04 VITALS — BP 140/75 | HR 62

## 2023-01-04 DIAGNOSIS — R2681 Unsteadiness on feet: Secondary | ICD-10-CM | POA: Diagnosis not present

## 2023-01-04 DIAGNOSIS — M5459 Other low back pain: Secondary | ICD-10-CM

## 2023-01-04 DIAGNOSIS — R2689 Other abnormalities of gait and mobility: Secondary | ICD-10-CM

## 2023-01-04 DIAGNOSIS — M6281 Muscle weakness (generalized): Secondary | ICD-10-CM | POA: Diagnosis not present

## 2023-01-04 NOTE — Therapy (Signed)
 OUTPATIENT PHYSICAL THERAPY NEURO TREATMENT- RECERTIFICATION   Patient Name: Cristina Wilson MRN: [id number] DOB:April 18, 1950, 72 y.o., female Today's Date: 01/04/2023   PCP: Roseann Coad, MD REFERRING PROVIDER: Dwight Isles, MD     END OF SESSION:  PT End of Session - 01/04/23 1106     Visit Number 12    Number of Visits 29   Recert   Date for PT Re-Evaluation 03/01/23   Recert   Authorization Type [employer]    PT Start Time 1103    PT Stop Time 1145    PT Time Calculation (min) 42 min    Equipment Utilized During Treatment Gait belt    Activity Tolerance Patient tolerated treatment well    Behavior During Therapy WFL for tasks assessed/performed               Past Medical History:  Diagnosis Date   Abdominal pain 06/06/2018   Acute renal failure (ARF) (HCC) 06/22/2018   Anemia in chronic kidney disease 08/25/2018   Anemia, unspecified 07/05/2018   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 states she has 2 aneurysms   Chronic low back pain 04/12/2017   Chronic pain syndrome 04/12/2017   Closed fracture of left distal radius    De Quervain's tenosynovitis 02/10/2018   Degeneration of lumbar intervertebral disc 03/01/2017   End stage renal failure on dialysis [facility])    M/W/F on [employer]   GERD (gastroesophageal reflux disease)    Goodpasture syndrome (HCC)    Headache 10/30/2014   Hyperlipidemia 04/17/2018   Hypertension    Hypothyroidism    Iron deficiency anemia, unspecified 07/12/2018   PONV (postoperative nausea and vomiting)    violent vomiting   Sacral back pain 05/25/2016   Scoliosis of lumbar spine 03/01/2017   Secondary hyperparathyroidism of renal origin (HCC) 08/22/2018   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   Thyroid  disease    Trigger finger of left thumb 02/10/2018   Past Surgical History:  Procedure  Laterality Date   ARTERY BIOPSY Right 10/31/2014   Procedure: BIOPSY TEMPORAL ARTERY-RIGHT;  Surgeon: Lynwood JONETTA Collum, MD;  Location: [facility] OR;  Service: Vascular;  Laterality: Right;   AV FISTULA PLACEMENT Left 10/11/2018   Procedure: left arm ARTERIOVENOUS (AV) FISTULA  creation;  Surgeon: Sheree Penne Bruckner, MD;  Location: [facility] OR;  Service: Vascular;  Laterality: Left;   BILATERAL CARPAL TUNNEL RELEASE     BIOPSY  04/29/2019   Procedure: BIOPSY;  Surgeon: Elicia Claw, MD;  Location: [facility]COPY;  Service: Gastroenterology;;   BIOPSY  11/05/2019   Procedure: BIOPSY;  Surgeon: Elicia Claw, MD;  Location: [facility];  Service: Gastroenterology;;   BREAST SURGERY Left    lumpectomy   BUNIONECTOMY Right    CHOLECYSTECTOMY N/A 10/13/2016   Procedure: LAPAROSCOPIC CHOLECYSTECTOMY WITH INTRAOPERATIVE CHOLANGIOGRAM;  Surgeon: Belinda Cough, MD;  Location: [facility] OR;  Service: General;  Laterality: N/A;   COLONOSCOPY WITH PROPOFOL  N/A 11/05/2019   Procedure: COLONOSCOPY WITH PROPOFOL ;  Surgeon: Elicia Claw, MD;  Location: [facility];  Service: Gastroenterology;  Laterality: N/A;   ESOPHAGOGASTRODUODENOSCOPY (EGD) WITH PROPOFOL  N/A 04/29/2019   Procedure: ESOPHAGOGASTRODUODENOSCOPY (EGD) WITH PROPOFOL ;  Surgeon: Elicia Claw, MD;  Location: [facility];  Service: Gastroenterology;  Laterality: N/A;   EYE SURGERY     surgery for cross eye   FOOT FRACTURE SURGERY  OPEN REDUCTION INTERNAL FIXATION (ORIF) DISTAL RADIAL FRACTURE Left 03/29/2017   Procedure: OPEN REDUCTION INTERNAL FIXATION (ORIF)LEFT  DISTAL RADIAL FRACTURE;  Surgeon: Murrell Drivers, MD;  Location: [facility];  Service: Orthopedics;  Laterality: Left;   POLYPECTOMY  11/05/2019   Procedure: POLYPECTOMY;  Surgeon: Elicia Claw, MD;  Location: [facility];  Service: Gastroenterology;;   THYROIDECTOMY     TONSILLECTOMY     WISDOM TOOTH EXTRACTION     Patient Active Problem List   Diagnosis  Date Noted   Hypertensive crisis 10/23/2019   History of cerebral aneurysm 10/23/2019   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   Hypoxemia 02/21/2019   Pulmonary edema 02/20/2019   Encounter for immunization 09/26/2018   Anemia in chronic kidney disease 08/25/2018   End stage renal disease (HCC) 08/25/2018   Secondary hyperparathyroidism of renal origin (HCC) 08/22/2018   Referred otalgia of left ear 08/16/2018   Iron deficiency anemia, unspecified 07/12/2018   Shortness of breath 07/06/2018   Anemia, unspecified 07/05/2018   Anxiety 07/05/2018   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   Acute renal failure (ARF) (HCC) 06/22/2018   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   Abdominal pain 06/06/2018   Hydronephrosis due to obstruction of ureteral orifice 06/06/2018   Disease of thyroid  gland 04/17/2018   Hyperlipidemia 04/17/2018   Hypertension 04/17/2018   De Quervain's tenosynovitis 02/10/2018   Trigger finger of left thumb 02/10/2018   Chronic low back pain 04/12/2017   Chronic pain syndrome 04/12/2017   Degeneration of lumbar intervertebral disc 03/01/2017   Scoliosis of lumbar spine 03/01/2017   Sacral back pain 05/25/2016   Eye pain 10/30/2014    ONSET DATE: 11/10/2022 (referral)   REFERRING DIAG:  F51.937 (ICD-10-CM) - Spinal stenosis, lumbar region with neurogenic claudication    THERAPY DIAG:  Muscle weakness (generalized)  Other abnormalities of gait and mobility  Unsteadiness on feet  Other low back pain  Rationale for Evaluation and Treatment: Rehabilitation  SUBJECTIVE:                                                                                                                                                                                             SUBJECTIVE STATEMENT: Pt reports feeling very weak today, rating it as a 8-9/10. Rating her pain as a 8-9/10 as well. Felt as though she would fall in the sink this  morning while washing dishes. No falls  Pt accompanied by: self  PERTINENT HISTORY: hypothyroidism; HTN; HLD; Goodpasture syndrome; cerebral aneurysm; ESRD on TTS HD; and chronic pain, kidney replacement (12/14/2020).   PAIN:  Are you having pain? No  PRECAUTIONS: Cervical and Fall  RED FLAGS: None   WEIGHT BEARING RESTRICTIONS: No  FALLS: Has patient fallen in last 6 months? Yes. Number of falls %  LIVING ENVIRONMENT: Lives with:  her dog, Snickers  Lives in: House/apartment Stairs: Yes: External: 5 in carport w/bilateral rails and 2 in back w/no rails steps; see previous Has following equipment at home: Single point cane, Walker - 2 wheeled, shower chair, and walking sticks   PLOF: Independent  PATIENT GOALS: To establish mobility so that I do not fall and I feel confident   OBJECTIVE:  Note: Objective measures were completed at Evaluation unless otherwise noted.  DIAGNOSTIC FINDINGS: MRI of lumbar spine from 2022   Disc levels:   T12- L1: Unremarkable.   L1-L2: Ventral spondylitic spurring   L2-L3: Disc narrowing and bulging with mild right facet spurring.   L3-L4: Disc narrowing and bulging.  Mild facet spurring   L4-L5: Disc narrowing and bulging with left inferior foraminal protrusion. Asymmetric left facet spurring. Left foraminal stenosis is mild   L5-S1:Disc narrowing and bulging with endplate spurring eccentric to the left. Negative facets.   IMPRESSION: Generalized lumbar spine degeneration similar to 2017. No impingement to explain right leg symptoms.  COGNITION: Overall cognitive status: Within functional limits for tasks assessed   SENSATION: Shooting pain down her RLE that follows sciatic n. Distribution    POSTURE: rounded shoulders and forward head  LOWER EXTREMITY MMT:  Tested in seated position   MMT Right Eval Left Eval  Hip flexion 4- 4  Hip extension    Hip abduction 5 5  Hip adduction 5 5  Hip internal rotation    Hip  external rotation    Knee flexion 4+ 5  Knee extension 5 5  Ankle dorsiflexion 4 5  Ankle plantarflexion    Ankle inversion    Ankle eversion    (Blank rows = not tested)   GAIT: Gait pattern:  Decreased eccentric control of LLE, step through pattern, decreased hip/knee flexion- Right, decreased ankle dorsiflexion- Right, lateral hip instability, and lateral lean- Left Distance walked: Various clinic distances  Assistive device utilized: None Level of assistance: SBA Comments: Pt demonstrates improved gait kinematics but continues to be limited by poor eccentric control of LLE w/fatigue and L lateral lean   VITALS Vitals:   01/04/23 1110 01/04/23 1123  BP: 96/62 (!) 140/75  Pulse: (!) 57 62        TODAY'S TREATMENT:      Ther Act  Assessed vitals (see above) and BP on lower end but within limits for therapy.  Trigger point manual therapy to L latissimus dorsi x3 minutes via therapist and x3 minutes w/theracane.   Ther Ex  SciFit multi-peaks level 7 for 8 minutes using BUE/BLEs for neural priming for reciprocal movement, dynamic cardiovascular warmup and increased amplitude of stepping. RPE of 15/10 following activity. Reassessed vitals (see above) and pt w/appropriate cardiovascular response  Cat cows on mat table, x10 reps, for improved thoracic mobility and pain modulation. Pt with increased pain w/extension and pt reported sciatica down LLE rather than RLE. Pt unable to tolerate position due to bilateral shoulder weakness.  Supine lying on yoga blocks for improved thoracic mobility, x2 minutes w/BUEs in 90 degrees of abduction. Pt reported pain in L lat w/movement, so performed manual therapy (see ther act above)  Seated shoulder W's w/red theraband, x12 reps, for improved lower trap strength and postural control. Pt w/significant pain in upper thoracic/lower cervical spine with this.  PATIENT EDUCATION: Education details: continue HEP, updated goals  Person educated:  Patient Education method: Explanation Education comprehension: verbalized understanding  HOME EXERCISE PROGRAM: Verbally added seated march overs on eval   Access Code: [email or web address] URL: [email or web address] Date: 11/23/2022 Prepared by: Marlon Jaedyn Lard  Exercises - Seated Piriformis Stretch with Trunk Bend  - 1 x daily - 7 x weekly - 3 sets - 30-45 second  hold - Supine Figure 4 Piriformis Stretch  - 1 x daily - 7 x weekly - 3 sets - 45-60 second hold - Supine Straight Leg Lumbar Rotation Stretch  - 1 x daily - 7 x weekly - 1 sets - 5-10 reps - 10-20 second hold - Seated Hamstring Stretch  - 1 x daily - 7 x weekly - 3 sets - 10 reps - Supine Butterfly Groin Stretch  - 1 x daily - 7 x weekly - 3 sets - 10 reps - Forward Step Down with Heel Tap and Rail Support  - 1 x daily - 7 x weekly - 3 sets - 10 reps - Side Step Down with Counter Support  - 1 x daily - 7 x weekly - 3 sets - 10 reps  GOALS: Goals reviewed with patient? Yes  SHORT TERM GOALS: Target date: 12/17/2022   Pt will be independent with initial  HEP for improved strength, balance, transfers and gait.  Baseline: Goal status: MET  2.  Pt will improve FGA to 18/30 for decreased fall risk   Baseline: 14/30; 27/30  Goal status: MET  3.  MCTSIB to be assessed and STG/LTG updated  Baseline:  Goal status: DC due to high baseline score   4.  Pt will ambulate greater than or equal to 1625' feet on with RPE of <7/10 for improved cardiovascular endurance and BLE strength.   Baseline: 1583' w/RPE of 8-10/10, no AD (11/25); 1600' w/RPE of 7-8/10 no AD  Goal status: PARTIALLY MET     LONG TERM GOALS: Target date: 12/31/2022    Pt will be independent with final HEP for improved strength, balance, transfers and gait.  Baseline:  Goal status: IN PROGRESS  2.  Pt will improve FGA to 22/30 for decreased fall risk   Baseline: 14/30; 27/30  Goal status: MET   3.  MCTSIB goal  Baseline:  Goal status:  DC due to high baseline score   4.  Pt will ambulate greater than or equal to 1700 feet on with <5/10 RPE for improved cardiovascular endurance and BLE strength.   Baseline: 1583' no AD RPE of 8-10/10 Goal status: IN PROGRESS   NEW SHORT TERM GOALS FOR UPDATED POC:   Target date: 02/01/2023  Pt will demonstrate proper lifting technique of 5# object w/no more than 4/10 pain for improved posterior chain strength and safety at home  Baseline: Hands braced on thighs, >8/10 pain  Goal status: INITIAL  2.  Pt will ambulate greater than or equal to 1700 feet on with <5/10 RPE for improved cardiovascular endurance and BLE strength.  Baseline: 1583' no AD RPE of 8-10/10 Goal status: IN PROGRESS   NEW LONG TERM GOALS FOR UPDATED POC:  Target date: 03/01/2023  Pt will be independent with final HEP for improved strength, balance, transfers and gait. Baseline:  Goal status: IN PROGRESS  2.  Pt will lift 5# from [facility] shelf w/proper form and no more than 4/10 pain for improved body mechanics and reduced pain levels  Baseline:  Goal status: INITIAL  ASSESSMENT:  CLINICAL IMPRESSION: Emphasis of skilled PT session on LTG assessment, periscapular strength and spinal mobility. Pt very limited by pain and fatigue this date but did demonstrate good cardiovascular response throughout session. Pt has met 1 of 3 LTGs, improving her score on FGA to 27/30. Pt continues to progress towards her remaining two goals of independence w/HEP and improved endurance via . Pt is to have surgery on cervical spine soon, unsure of date yet, so will continue to work on posterior chain and paraspinal strengthening to prepare for surgery. Continue POC.    OBJECTIVE IMPAIRMENTS: Abnormal gait, decreased activity tolerance, decreased balance, decreased endurance, decreased mobility, difficulty walking, decreased ROM, decreased strength, impaired flexibility, impaired sensation, postural dysfunction, and  pain  ACTIVITY LIMITATIONS: lifting, standing, stairs, transfers, bed mobility, bathing, hygiene/grooming, locomotion level, and caring for others  PARTICIPATION LIMITATIONS: cleaning, laundry, shopping, community activity, and yard work  PERSONAL FACTORS: Age, Fitness, Past/current experiences, and 1-2 comorbidities: Lumbar stenosis, 3 brain aneurysms, kidney transplant in 2022  are also affecting patient's functional outcome.   REHAB POTENTIAL: Good  CLINICAL DECISION MAKING: Evolving/moderate complexity  EVALUATION COMPLEXITY: Moderate  PLAN:  PT FREQUENCY: 2x/week  PT DURATION: 6 weeks + 8 weeks (recert)  PLANNED INTERVENTIONS: 02835- PT Re-evaluation, 97110-Therapeutic exercises, 97530- Therapeutic activity, 97112- Neuromuscular re-education, 97535- Self Care, 02859- Manual therapy, [phone]- Gait training, [phone]- Orthotic Fit/training, [phone]- Aquatic Therapy, Patient/Family education, Balance training, Stair training, Dry Needling, Joint mobilization, Vestibular training, and DME instructions  PLAN FOR NEXT SESSION: Check BP. Add to HEP for improved spinal and R hip mobility, strength. Global endurance and functional strength (lifting), eccentric control, head turns, anticipatory balance, blaze pods, rebounder, rockerboard, scapular retraction, slam balls, scap push ups    Tessah Patchen E Manila Rommel, PT, DPT 01/04/2023, 11:51 AM

## 2023-01-06 ENCOUNTER — Ambulatory Visit: Payer: Medicare HMO | Admitting: Physical Therapy

## 2023-01-11 ENCOUNTER — Ambulatory Visit: Payer: Medicare HMO | Attending: Vascular Surgery | Admitting: Physical Therapy

## 2023-01-11 VITALS — BP 116/66 | HR 59

## 2023-01-11 DIAGNOSIS — M6281 Muscle weakness (generalized): Secondary | ICD-10-CM | POA: Insufficient documentation

## 2023-01-11 DIAGNOSIS — R2681 Unsteadiness on feet: Secondary | ICD-10-CM | POA: Insufficient documentation

## 2023-01-11 DIAGNOSIS — R2689 Other abnormalities of gait and mobility: Secondary | ICD-10-CM | POA: Insufficient documentation

## 2023-01-11 DIAGNOSIS — M542 Cervicalgia: Secondary | ICD-10-CM | POA: Insufficient documentation

## 2023-01-11 DIAGNOSIS — M5459 Other low back pain: Secondary | ICD-10-CM | POA: Insufficient documentation

## 2023-01-11 DIAGNOSIS — M5431 Sciatica, right side: Secondary | ICD-10-CM | POA: Insufficient documentation

## 2023-01-11 NOTE — Therapy (Addendum)
 OUTPATIENT PHYSICAL THERAPY NEURO TREATMENT   Patient Name: Cristina Wilson MRN: [id number] DOB:06-27-1950, 73 y.o., female Today's Date: 01/11/2023   PCP: Roseann Coad, MD REFERRING PROVIDER: Dwight Isles, MD     END OF SESSION:  PT End of Session - 01/11/23 1019     Visit Number 13    Number of Visits 29   Recert   Date for PT Re-Evaluation 03/01/23   Recert   Authorization Type [employer]    PT Start Time 1017    PT Stop Time 1100    PT Time Calculation (min) 43 min    Equipment Utilized During Treatment --    Activity Tolerance Patient tolerated treatment well    Behavior During Therapy WFL for tasks assessed/performed               Past Medical History:  Diagnosis Date   Abdominal pain 06/06/2018   Acute renal failure (ARF) (HCC) 06/22/2018   Anemia in chronic kidney disease 08/25/2018   Anemia, unspecified 07/05/2018   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 states she has 2 aneurysms   Chronic low back pain 04/12/2017   Chronic pain syndrome 04/12/2017   Closed fracture of left distal radius    De Quervain's tenosynovitis 02/10/2018   Degeneration of lumbar intervertebral disc 03/01/2017   End stage renal failure on dialysis [facility])    M/W/F on [employer]   GERD (gastroesophageal reflux disease)    Goodpasture syndrome (HCC)    Headache 10/30/2014   Hyperlipidemia 04/17/2018   Hypertension    Hypothyroidism    Iron deficiency anemia, unspecified 07/12/2018   PONV (postoperative nausea and vomiting)    violent vomiting   Sacral back pain 05/25/2016   Scoliosis of lumbar spine 03/01/2017   Secondary hyperparathyroidism of renal origin (HCC) 08/22/2018   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   Thyroid  disease    Trigger finger of left thumb 02/10/2018   Past Surgical History:  Procedure Laterality Date   ARTERY BIOPSY  Right 10/31/2014   Procedure: BIOPSY TEMPORAL ARTERY-RIGHT;  Surgeon: Lynwood JONETTA Collum, MD;  Location: [facility] OR;  Service: Vascular;  Laterality: Right;   AV FISTULA PLACEMENT Left 10/11/2018   Procedure: left arm ARTERIOVENOUS (AV) FISTULA  creation;  Surgeon: Sheree Penne Bruckner, MD;  Location: [facility] OR;  Service: Vascular;  Laterality: Left;   BILATERAL CARPAL TUNNEL RELEASE     BIOPSY  04/29/2019   Procedure: BIOPSY;  Surgeon: Elicia Claw, MD;  Location: [facility];  Service: Gastroenterology;;   BIOPSY  11/05/2019   Procedure: BIOPSY;  Surgeon: Elicia Claw, MD;  Location: [facility];  Service: Gastroenterology;;   BREAST SURGERY Left    lumpectomy   BUNIONECTOMY Right    CHOLECYSTECTOMY N/A 10/13/2016   Procedure: LAPAROSCOPIC CHOLECYSTECTOMY WITH INTRAOPERATIVE CHOLANGIOGRAM;  Surgeon: Belinda Cough, MD;  Location: [facility] OR;  Service: General;  Laterality: N/A;   COLONOSCOPY WITH PROPOFOL  N/A 11/05/2019   Procedure: COLONOSCOPY WITH PROPOFOL ;  Surgeon: Elicia Claw, MD;  Location: [facility];  Service: Gastroenterology;  Laterality: N/A;   ESOPHAGOGASTRODUODENOSCOPY (EGD) WITH PROPOFOL  N/A 04/29/2019   Procedure: ESOPHAGOGASTRODUODENOSCOPY (EGD) WITH PROPOFOL ;  Surgeon: Elicia Claw, MD;  Location: [facility];  Service: Gastroenterology;  Laterality: N/A;   EYE SURGERY     surgery for cross eye   FOOT FRACTURE SURGERY     OPEN REDUCTION  INTERNAL FIXATION (ORIF) DISTAL RADIAL FRACTURE Left 03/29/2017   Procedure: OPEN REDUCTION INTERNAL FIXATION (ORIF)LEFT  DISTAL RADIAL FRACTURE;  Surgeon: Murrell Drivers, MD;  Location: [facility];  Service: Orthopedics;  Laterality: Left;   POLYPECTOMY  11/05/2019   Procedure: POLYPECTOMY;  Surgeon: Elicia Claw, MD;  Location: [facility];  Service: Gastroenterology;;   THYROIDECTOMY     TONSILLECTOMY     WISDOM TOOTH EXTRACTION     Patient Active Problem List   Diagnosis Date Noted   Hypertensive crisis  10/23/2019   History of cerebral aneurysm 10/23/2019   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   Hypoxemia 02/21/2019   Pulmonary edema 02/20/2019   Encounter for immunization 09/26/2018   Anemia in chronic kidney disease 08/25/2018   End stage renal disease (HCC) 08/25/2018   Secondary hyperparathyroidism of renal origin (HCC) 08/22/2018   Referred otalgia of left ear 08/16/2018   Iron deficiency anemia, unspecified 07/12/2018   Shortness of breath 07/06/2018   Anemia, unspecified 07/05/2018   Anxiety 07/05/2018   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   Acute renal failure (ARF) (HCC) 06/22/2018   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   Abdominal pain 06/06/2018   Hydronephrosis due to obstruction of ureteral orifice 06/06/2018   Disease of thyroid  gland 04/17/2018   Hyperlipidemia 04/17/2018   Hypertension 04/17/2018   De Quervain's tenosynovitis 02/10/2018   Trigger finger of left thumb 02/10/2018   Chronic low back pain 04/12/2017   Chronic pain syndrome 04/12/2017   Degeneration of lumbar intervertebral disc 03/01/2017   Scoliosis of lumbar spine 03/01/2017   Sacral back pain 05/25/2016   Eye pain 10/30/2014    ONSET DATE: 11/10/2022 (referral)   REFERRING DIAG:  F51.937 (ICD-10-CM) - Spinal stenosis, lumbar region with neurogenic claudication    THERAPY DIAG:  Muscle weakness (generalized)  Other abnormalities of gait and mobility  Unsteadiness on feet  Other low back pain  Sciatica, right side  Rationale for Evaluation and Treatment: Rehabilitation  SUBJECTIVE:                                                                                                                                                                                             SUBJECTIVE STATEMENT: Pt reports having good days and bad days. Feels dizzy when she wakes up and has had a tight feeling in her head. Denies pain but states she has global  discomfort today.   Pt accompanied by: self  PERTINENT HISTORY: hypothyroidism; HTN; HLD; Goodpasture syndrome; cerebral aneurysm; ESRD on TTS HD; and chronic pain, kidney replacement (12/14/2020).   PAIN:  Are you having pain? No  PRECAUTIONS: Cervical and Fall  RED FLAGS: None   WEIGHT BEARING RESTRICTIONS: No  FALLS: Has patient fallen in last 6 months? Yes. Number of falls %  LIVING ENVIRONMENT: Lives with:  her dog, Snickers  Lives in: House/apartment Stairs: Yes: External: 5 in carport w/bilateral rails and 2 in back w/no rails steps; see previous Has following equipment at home: Single point cane, Walker - 2 wheeled, shower chair, and walking sticks   PLOF: Independent  PATIENT GOALS: To establish mobility so that I do not fall and I feel confident   OBJECTIVE:  Note: Objective measures were completed at Evaluation unless otherwise noted.  DIAGNOSTIC FINDINGS: MRI of lumbar spine from 2022   Disc levels:   T12- L1: Unremarkable.   L1-L2: Ventral spondylitic spurring   L2-L3: Disc narrowing and bulging with mild right facet spurring.   L3-L4: Disc narrowing and bulging.  Mild facet spurring   L4-L5: Disc narrowing and bulging with left inferior foraminal protrusion. Asymmetric left facet spurring. Left foraminal stenosis is mild   L5-S1:Disc narrowing and bulging with endplate spurring eccentric to the left. Negative facets.   IMPRESSION: Generalized lumbar spine degeneration similar to 2017. No impingement to explain right leg symptoms.  COGNITION: Overall cognitive status: Within functional limits for tasks assessed   SENSATION: Shooting pain down her RLE that follows sciatic n. Distribution    POSTURE: rounded shoulders and forward head  LOWER EXTREMITY MMT:  Tested in seated position   MMT Right Eval Left Eval  Hip flexion 4- 4  Hip extension    Hip abduction 5 5  Hip adduction 5 5  Hip internal rotation    Hip external rotation     Knee flexion 4+ 5  Knee extension 5 5  Ankle dorsiflexion 4 5  Ankle plantarflexion    Ankle inversion    Ankle eversion    (Blank rows = not tested)   GAIT: Gait pattern:  Decreased eccentric control of LLE, step through pattern, decreased hip/knee flexion- Right, decreased ankle dorsiflexion- Right, lateral hip instability, and lateral lean- Left Distance walked: Various clinic distances  Assistive device utilized: None Level of assistance: SBA Comments: Pt demonstrates improved gait kinematics but continues to be limited by poor eccentric control of LLE w/fatigue and L lateral lean   VITALS Vitals:   01/11/23 1022  BP: 116/66  Pulse: (!) 59  SpO2: 99%      TODAY'S TREATMENT:      Ther Act  Assessed vitals (see above) and within limits for therapy.   VESTIBULAR ASSESSMENT (performed by vestibular therapist):    SYMPTOM BEHAVIOR:  Subjective history: Pt reports she has had vertigo symptoms for about a year. Feels imbalanced when she performs supine to sit and rolling to L side. Frequently feels tight in head in the AM and has to move slowly in the morning when she first wakes up.   Non-Vestibular symptoms: neck pain and headaches  Type of dizziness: Imbalance (Disequilibrium), Spinning/Vertigo, Unsteady with head/body turns, Lightheadedness/Faint, Funny feeling in the head, and Swimmyheaded  Frequency: Daily   Duration: 10-15s   Aggravating factors: Induced by position change: lying supine, rolling to the left, and supine to sit, Induced by motion: looking up at the ceiling, bending down to the ground, and turning head quickly, and Worse in the morning  Relieving factors: lying supine, rest, and slow movements  Progression of symptoms: unchanged    POSITIONAL TESTING: Right Dix-Hallpike: no nystagmus and performed modified with pillow due to decr  cervical extension Left Dix-Hallpike: no nystagmus and performed modified with pillow due to decr cervical  extension Right Roll Test: no nystagmus Left Roll Test: no nystagmus Right Sidelying: no nystagmus Left Sidelying: no nystagmus and pt reporting some dizziness initially in position, but no nystagmus is noted    MOTION SENSITIVITY:    Motion Sensitivity Quotient Intensity: 0 = none, 1 = Lightheaded, 2 = Mild, 3 = Moderate, 4 = Severe, 5 = Vomiting  Intensity  1. Sitting to supine   2. Supine to L side   3. Supine to R side   4. Supine to sitting   5. L Hallpike-Dix   6. Up from L    7. R Hallpike-Dix   8. Up from R    9. Sitting, head  tipped to L knee   10. Head up from L  knee   11. Sitting, head  tipped to R knee   12. Head up from R  knee   13. Sitting head turns x5   14.Sitting head nods x5   15. In stance, 180  turn to L    16. In stance, 180  turn to R     OTHOSTATICS: Taken in RUE Sitting: 109/74 mmHg, HR 57 bpm Standing: 115/78 mmHg, HR 59 bpm    Performed Brandt-Daroff x4 reps per side and added to HEP (see bolded below). Pt reported dizziness and nausea with movement (none in sidelying position, just had symptoms when returning upright) but was able to tolerate.    PATIENT EDUCATION: Education details: additions to HEP with Wilhelmena Carrel  Person educated: Patient Education method: Explanation, Demonstration, and Handouts Education comprehension: verbalized understanding, returned demonstration, and needs further education  HOME EXERCISE PROGRAM: Verbally added seated march overs on eval   Access Code: [email or web address] URL: [email or web address] Date: 11/23/2022 Prepared by: Marlon Clova Morlock  Exercises - Seated Piriformis Stretch with Trunk Bend  - 1 x daily - 7 x weekly - 3 sets - 30-45 second  hold - Supine Figure 4 Piriformis Stretch  - 1 x daily - 7 x weekly - 3 sets - 45-60 second hold - Supine Straight Leg Lumbar Rotation Stretch  - 1 x daily - 7 x weekly - 1 sets - 5-10 reps - 10-20 second hold - Seated Hamstring Stretch  - 1 x daily  - 7 x weekly - 3 sets - 10 reps - Supine Butterfly Groin Stretch  - 1 x daily - 7 x weekly - 3 sets - 10 reps - Forward Step Down with Heel Tap and Rail Support  - 1 x daily - 7 x weekly - 3 sets - 10 reps - Side Step Down with Counter Support  - 1 x daily - 7 x weekly - 3 sets - 10 reps - Brandt-Daroff Vestibular Exercise  - 1 x daily - 7 x weekly - 3 sets - 10 reps  GOALS: Goals reviewed with patient? Yes  SHORT TERM GOALS: Target date: 12/17/2022   Pt will be independent with initial  HEP for improved strength, balance, transfers and gait.  Baseline: Goal status: MET  2.  Pt will improve FGA to 18/30 for decreased fall risk   Baseline: 14/30; 27/30  Goal status: MET  3.  MCTSIB to be assessed and STG/LTG updated  Baseline:  Goal status: DC due to high baseline score   4.  Pt will ambulate greater than or equal to 1625' feet on with RPE of <7/10 for improved cardiovascular endurance and  BLE strength.   Baseline: 1583' w/RPE of 8-10/10, no AD (11/25); 1600' w/RPE of 7-8/10 no AD  Goal status: PARTIALLY MET     LONG TERM GOALS: Target date: 12/31/2022    Pt will be independent with final HEP for improved strength, balance, transfers and gait.  Baseline:  Goal status: IN PROGRESS  2.  Pt will improve FGA to 22/30 for decreased fall risk   Baseline: 14/30; 27/30  Goal status: MET   3.  MCTSIB goal  Baseline:  Goal status: DC due to high baseline score   4.  Pt will ambulate greater than or equal to 1700 feet on with <5/10 RPE for improved cardiovascular endurance and BLE strength.   Baseline: 1583' no AD RPE of 8-10/10 Goal status: IN PROGRESS   NEW SHORT TERM GOALS FOR UPDATED POC:   Target date: 02/01/2023  Pt will demonstrate proper lifting technique of 5# object w/no more than 4/10 pain for improved posterior chain strength and safety at home  Baseline: Hands braced on thighs, >8/10 pain  Goal status: INITIAL  2.  Pt will ambulate greater than  or equal to 1700 feet on with <5/10 RPE for improved cardiovascular endurance and BLE strength.  Baseline: 1583' no AD RPE of 8-10/10 Goal status: IN PROGRESS   NEW LONG TERM GOALS FOR UPDATED POC:  Target date: 03/01/2023  Pt will be independent with final HEP for improved strength, balance, transfers and gait. Baseline:  Goal status: IN PROGRESS  2.  Pt will lift 5# from [facility] shelf w/proper form and no more than 4/10 pain for improved body mechanics and reduced pain levels  Baseline:  Goal status: INITIAL    ASSESSMENT:  CLINICAL IMPRESSION: Emphasis of skilled PT session on vestibular assessment as pt continues to report vertigo symptoms w/positional changes. No nystagmus noted w/change in position, but pt did report dizziness, especially when turning to L side. Pt negative for BPPV.  Added Brandt-Daroff to HEP and pt symptomatic with this. Assessed orthostatics and pt negative for this. Pt to call Dr. Thayer office tomorrow to inquire about status on cervical spine surgery. Continue POC.    OBJECTIVE IMPAIRMENTS: Abnormal gait, decreased activity tolerance, decreased balance, decreased endurance, decreased mobility, difficulty walking, decreased ROM, decreased strength, impaired flexibility, impaired sensation, postural dysfunction, and pain  ACTIVITY LIMITATIONS: lifting, standing, stairs, transfers, bed mobility, bathing, hygiene/grooming, locomotion level, and caring for others  PARTICIPATION LIMITATIONS: cleaning, laundry, shopping, community activity, and yard work  PERSONAL FACTORS: Age, Fitness, Past/current experiences, and 1-2 comorbidities: Lumbar stenosis, 3 brain aneurysms, kidney transplant in 2022  are also affecting patient's functional outcome.   REHAB POTENTIAL: Good  CLINICAL DECISION MAKING: Evolving/moderate complexity  EVALUATION COMPLEXITY: Moderate  PLAN:  PT FREQUENCY: 2x/week  PT DURATION: 6 weeks + 8 weeks (recert)  PLANNED INTERVENTIONS:  02835- PT Re-evaluation, 97110-Therapeutic exercises, 97530- Therapeutic activity, 97112- Neuromuscular re-education, 97535- Self Care, 02859- Manual therapy, [phone]- Gait training, [phone]- Orthotic Fit/training, [phone]- Aquatic Therapy, Patient/Family education, Balance training, Stair training, Dry Needling, Joint mobilization, Vestibular training, and DME instructions  PLAN FOR NEXT SESSION: Check BP. Add to HEP for improved spinal and R hip mobility, strength. Global endurance and functional strength (lifting), eccentric control, head turns, anticipatory balance, blaze pods, rebounder, rockerboard, scapular retraction, slam balls, scap push ups, upright rows, chin tucks    Marlon BRAVO Neveah Bang, PT, DPT 01/11/2023, 11:01 AM     Addendum by: Sheffield Senate, PT, DPT 01/11/23 1:04 PM

## 2023-01-13 ENCOUNTER — Ambulatory Visit: Payer: Medicare HMO | Admitting: Physical Therapy

## 2023-01-13 VITALS — BP 110/64 | HR 62

## 2023-01-13 DIAGNOSIS — M5431 Sciatica, right side: Secondary | ICD-10-CM | POA: Diagnosis not present

## 2023-01-13 DIAGNOSIS — R2681 Unsteadiness on feet: Secondary | ICD-10-CM

## 2023-01-13 DIAGNOSIS — R2689 Other abnormalities of gait and mobility: Secondary | ICD-10-CM

## 2023-01-13 DIAGNOSIS — M6281 Muscle weakness (generalized): Secondary | ICD-10-CM

## 2023-01-13 DIAGNOSIS — M542 Cervicalgia: Secondary | ICD-10-CM | POA: Diagnosis not present

## 2023-01-13 DIAGNOSIS — M5459 Other low back pain: Secondary | ICD-10-CM

## 2023-01-13 NOTE — Therapy (Signed)
 OUTPATIENT PHYSICAL THERAPY NEURO TREATMENT   Patient Name: Cristina Wilson MRN: [id number] DOB:January 13, 1950, 73 y.o., female Today's Date: 01/13/2023   PCP: Roseann Coad, MD REFERRING PROVIDER: Dwight Isles, MD     END OF SESSION:  PT End of Session - 01/13/23 0852     Visit Number 14    Number of Visits 29   Recert   Date for PT Re-Evaluation 03/01/23   Recert   Authorization Type [employer]    PT Start Time 0849    PT Stop Time 0931    PT Time Calculation (min) 42 min    Activity Tolerance Patient tolerated treatment well    Behavior During Therapy [facility] for tasks assessed/performed               Past Medical History:  Diagnosis Date   Abdominal pain 06/06/2018   Acute renal failure (ARF) (HCC) 06/22/2018   Anemia in chronic kidney disease 08/25/2018   Anemia, unspecified 07/05/2018   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 states she has 2 aneurysms   Chronic low back pain 04/12/2017   Chronic pain syndrome 04/12/2017   Closed fracture of left distal radius    De Quervain's tenosynovitis 02/10/2018   Degeneration of lumbar intervertebral disc 03/01/2017   End stage renal failure on dialysis [facility])    M/W/F on [employer]   GERD (gastroesophageal reflux disease)    Goodpasture syndrome (HCC)    Headache 10/30/2014   Hyperlipidemia 04/17/2018   Hypertension    Hypothyroidism    Iron deficiency anemia, unspecified 07/12/2018   PONV (postoperative nausea and vomiting)    violent vomiting   Sacral back pain 05/25/2016   Scoliosis of lumbar spine 03/01/2017   Secondary hyperparathyroidism of renal origin (HCC) 08/22/2018   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   Thyroid  disease    Trigger finger of left thumb 02/10/2018   Past Surgical History:  Procedure Laterality Date   ARTERY BIOPSY Right 10/31/2014   Procedure: BIOPSY  TEMPORAL ARTERY-RIGHT;  Surgeon: Lynwood JONETTA Collum, MD;  Location: [facility] OR;  Service: Vascular;  Laterality: Right;   AV FISTULA PLACEMENT Left 10/11/2018   Procedure: left arm ARTERIOVENOUS (AV) FISTULA  creation;  Surgeon: Sheree Penne Bruckner, MD;  Location: [facility] OR;  Service: Vascular;  Laterality: Left;   BILATERAL CARPAL TUNNEL RELEASE     BIOPSY  04/29/2019   Procedure: BIOPSY;  Surgeon: Elicia Claw, MD;  Location: [facility];  Service: Gastroenterology;;   BIOPSY  11/05/2019   Procedure: BIOPSY;  Surgeon: Elicia Claw, MD;  Location: [facility];  Service: Gastroenterology;;   BREAST SURGERY Left    lumpectomy   BUNIONECTOMY Right    CHOLECYSTECTOMY N/A 10/13/2016   Procedure: LAPAROSCOPIC CHOLECYSTECTOMY WITH INTRAOPERATIVE CHOLANGIOGRAM;  Surgeon: Belinda Cough, MD;  Location: [facility] OR;  Service: General;  Laterality: N/A;   COLONOSCOPY WITH PROPOFOL  N/A 11/05/2019   Procedure: COLONOSCOPY WITH PROPOFOL ;  Surgeon: Elicia Claw, MD;  Location: [facility];  Service: Gastroenterology;  Laterality: N/A;   ESOPHAGOGASTRODUODENOSCOPY (EGD) WITH PROPOFOL  N/A 04/29/2019   Procedure: ESOPHAGOGASTRODUODENOSCOPY (EGD) WITH PROPOFOL ;  Surgeon: Elicia Claw, MD;  Location: [facility];  Service: Gastroenterology;  Laterality: N/A;   EYE SURGERY     surgery for cross eye   FOOT FRACTURE SURGERY     OPEN REDUCTION INTERNAL FIXATION (ORIF) DISTAL RADIAL FRACTURE Left 03/29/2017  Procedure: OPEN REDUCTION INTERNAL FIXATION (ORIF)LEFT  DISTAL RADIAL FRACTURE;  Surgeon: Murrell Drivers, MD;  Location: [facility];  Service: Orthopedics;  Laterality: Left;   POLYPECTOMY  11/05/2019   Procedure: POLYPECTOMY;  Surgeon: Elicia Claw, MD;  Location: [facility];  Service: Gastroenterology;;   THYROIDECTOMY     TONSILLECTOMY     WISDOM TOOTH EXTRACTION     Patient Active Problem List   Diagnosis Date Noted   Hypertensive crisis 10/23/2019   History of cerebral  aneurysm 10/23/2019   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   Hypoxemia 02/21/2019   Pulmonary edema 02/20/2019   Encounter for immunization 09/26/2018   Anemia in chronic kidney disease 08/25/2018   End stage renal disease (HCC) 08/25/2018   Secondary hyperparathyroidism of renal origin (HCC) 08/22/2018   Referred otalgia of left ear 08/16/2018   Iron deficiency anemia, unspecified 07/12/2018   Shortness of breath 07/06/2018   Anemia, unspecified 07/05/2018   Anxiety 07/05/2018   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   Acute renal failure (ARF) (HCC) 06/22/2018   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   Abdominal pain 06/06/2018   Hydronephrosis due to obstruction of ureteral orifice 06/06/2018   Disease of thyroid  gland 04/17/2018   Hyperlipidemia 04/17/2018   Hypertension 04/17/2018   De Quervain's tenosynovitis 02/10/2018   Trigger finger of left thumb 02/10/2018   Chronic low back pain 04/12/2017   Chronic pain syndrome 04/12/2017   Degeneration of lumbar intervertebral disc 03/01/2017   Scoliosis of lumbar spine 03/01/2017   Sacral back pain 05/25/2016   Eye pain 10/30/2014    ONSET DATE: 11/10/2022 (referral)   REFERRING DIAG:  F51.937 (ICD-10-CM) - Spinal stenosis, lumbar region with neurogenic claudication    THERAPY DIAG:  Muscle weakness (generalized)  Other abnormalities of gait and mobility  Unsteadiness on feet  Other low back pain  Rationale for Evaluation and Treatment: Rehabilitation  SUBJECTIVE:                                                                                                                                                                                             SUBJECTIVE STATEMENT: Pt reports feeling okay today. Called Dr. Thayer office and left a VM regarding an update on surgery. Felt okay after last session, dizziness did not get worse. No falls.   Pt accompanied by:  self  PERTINENT HISTORY: hypothyroidism; HTN; HLD; Goodpasture syndrome; cerebral aneurysm; ESRD on TTS HD; and chronic pain, kidney replacement (12/14/2020).   PAIN:  Are you having pain? Yes: NPRS scale: 6 Pain location: RLE Pain description: Sciatica  PRECAUTIONS: Cervical and Fall  RED  FLAGS: None   WEIGHT BEARING RESTRICTIONS: No  FALLS: Has patient fallen in last 6 months? Yes. Number of falls %  LIVING ENVIRONMENT: Lives with:  her dog, Snickers  Lives in: House/apartment Stairs: Yes: External: 5 in carport w/bilateral rails and 2 in back w/no rails steps; see previous Has following equipment at home: Single point cane, Walker - 2 wheeled, shower chair, and walking sticks   PLOF: Independent  PATIENT GOALS: To establish mobility so that I do not fall and I feel confident   OBJECTIVE:  Note: Objective measures were completed at Evaluation unless otherwise noted.  DIAGNOSTIC FINDINGS: MRI of lumbar spine from 2022   Disc levels:   T12- L1: Unremarkable.   L1-L2: Ventral spondylitic spurring   L2-L3: Disc narrowing and bulging with mild right facet spurring.   L3-L4: Disc narrowing and bulging.  Mild facet spurring   L4-L5: Disc narrowing and bulging with left inferior foraminal protrusion. Asymmetric left facet spurring. Left foraminal stenosis is mild   L5-S1:Disc narrowing and bulging with endplate spurring eccentric to the left. Negative facets.   IMPRESSION: Generalized lumbar spine degeneration similar to 2017. No impingement to explain right leg symptoms.  COGNITION: Overall cognitive status: Within functional limits for tasks assessed   SENSATION: Shooting pain down her RLE that follows sciatic n. Distribution    POSTURE: rounded shoulders and forward head  LOWER EXTREMITY MMT:  Tested in seated position   MMT Right Eval Left Eval  Hip flexion 4- 4  Hip extension    Hip abduction 5 5  Hip adduction 5 5  Hip internal rotation     Hip external rotation    Knee flexion 4+ 5  Knee extension 5 5  Ankle dorsiflexion 4 5  Ankle plantarflexion    Ankle inversion    Ankle eversion    (Blank rows = not tested)   GAIT: Gait pattern:  Decreased eccentric control of LLE, step through pattern, decreased hip/knee flexion- Right, decreased ankle dorsiflexion- Right, lateral hip instability, and lateral lean- Left Distance walked: Various clinic distances  Assistive device utilized: None Level of assistance: SBA Comments: Pt demonstrates improved gait kinematics but continues to be limited by poor eccentric control of LLE w/fatigue and L lateral lean   VITALS Vitals:   01/13/23 0856  BP: 110/64  Pulse: 62       TODAY'S TREATMENT:      Ther Act  Assessed vitals (see above) and within limits for therapy.   Ther Ex  Seated slump sciatic nerve glide on RLE, x5 minutes. No worsening or improvement in symptoms w/activity.  Quadruped leg lifts w/RLE on green dynadisc, x15 reps, for improved glute med strength on RLE and posterior chain strength. No instability noted on RLE and pt denied pain w/activity   Manual Therapy  Seated upper trap stretch, x2 minutes per side. Pt more symptomatic on R side  Supine sub-occipital release, x8 minutes w/oscillations. Pt reported feeling most relief w/this.  Supine anterior scalene stretch, x5 minutes per side w/added cervical distraction.     PATIENT EDUCATION: Education details: Continue HEP  Person educated: Patient Education method: Programmer, Multimedia, Facilities Manager, and Handouts Education comprehension: verbalized understanding, returned demonstration, and needs further education  HOME EXERCISE PROGRAM: Verbally added seated march overs on eval   Access Code: [email or web address] URL: [email or web address] Date: 11/23/2022 Prepared by: Marlon Opie Fanton  Exercises - Seated Piriformis Stretch with Trunk Bend  - 1 x daily - 7 x weekly - 3 sets - 30-45 second  hold - Supine  Figure 4 Piriformis Stretch  - 1 x daily - 7 x weekly - 3 sets - 45-60 second hold - Supine Straight Leg Lumbar Rotation Stretch  - 1 x daily - 7 x weekly - 1 sets - 5-10 reps - 10-20 second hold - Seated Hamstring Stretch  - 1 x daily - 7 x weekly - 3 sets - 10 reps - Supine Butterfly Groin Stretch  - 1 x daily - 7 x weekly - 3 sets - 10 reps - Forward Step Down with Heel Tap and Rail Support  - 1 x daily - 7 x weekly - 3 sets - 10 reps - Side Step Down with Counter Support  - 1 x daily - 7 x weekly - 3 sets - 10 reps - Brandt-Daroff Vestibular Exercise  - 1 x daily - 7 x weekly - 3 sets - 10 reps  GOALS: Goals reviewed with patient? Yes  SHORT TERM GOALS: Target date: 12/17/2022   Pt will be independent with initial  HEP for improved strength, balance, transfers and gait.  Baseline: Goal status: MET  2.  Pt will improve FGA to 18/30 for decreased fall risk   Baseline: 14/30; 27/30  Goal status: MET  3.  MCTSIB to be assessed and STG/LTG updated  Baseline:  Goal status: DC due to high baseline score   4.  Pt will ambulate greater than or equal to 1625' feet on with RPE of <7/10 for improved cardiovascular endurance and BLE strength.   Baseline: 1583' w/RPE of 8-10/10, no AD (11/25); 1600' w/RPE of 7-8/10 no AD  Goal status: PARTIALLY MET     LONG TERM GOALS: Target date: 12/31/2022    Pt will be independent with final HEP for improved strength, balance, transfers and gait.  Baseline:  Goal status: IN PROGRESS  2.  Pt will improve FGA to 22/30 for decreased fall risk   Baseline: 14/30; 27/30  Goal status: MET   3.  MCTSIB goal  Baseline:  Goal status: DC due to high baseline score   4.  Pt will ambulate greater than or equal to 1700 feet on with <5/10 RPE for improved cardiovascular endurance and BLE strength.   Baseline: 1583' no AD RPE of 8-10/10 Goal status: IN PROGRESS   NEW SHORT TERM GOALS FOR UPDATED POC:   Target date: 02/01/2023  Pt will  demonstrate proper lifting technique of 5# object w/no more than 4/10 pain for improved posterior chain strength and safety at home  Baseline: Hands braced on thighs, >8/10 pain  Goal status: INITIAL  2.  Pt will ambulate greater than or equal to 1700 feet on with <5/10 RPE for improved cardiovascular endurance and BLE strength.  Baseline: 1583' no AD RPE of 8-10/10 Goal status: IN PROGRESS   NEW LONG TERM GOALS FOR UPDATED POC:  Target date: 03/01/2023  Pt will be independent with final HEP for improved strength, balance, transfers and gait. Baseline:  Goal status: IN PROGRESS  2.  Pt will lift 5# from [facility] shelf w/proper form and no more than 4/10 pain for improved body mechanics and reduced pain levels  Baseline:  Goal status: INITIAL    ASSESSMENT:  CLINICAL IMPRESSION: Emphasis of skilled PT session on sciatic nerve gliding, posterior chain strength and improved cervical ROM via manual therapy. Pt continues to have high pain levels in RLE despite consistency w/stretches and strength exercises, but has improved her hip stability on R side. Pt felt  most relief in cervical spine w/distraction and was able to tolerate extension without peripheralization of pain. Continue POC.    OBJECTIVE IMPAIRMENTS: Abnormal gait, decreased activity tolerance, decreased balance, decreased endurance, decreased mobility, difficulty walking, decreased ROM, decreased strength, impaired flexibility, impaired sensation, postural dysfunction, and pain  ACTIVITY LIMITATIONS: lifting, standing, stairs, transfers, bed mobility, bathing, hygiene/grooming, locomotion level, and caring for others  PARTICIPATION LIMITATIONS: cleaning, laundry, shopping, community activity, and yard work  PERSONAL FACTORS: Age, Fitness, Past/current experiences, and 1-2 comorbidities: Lumbar stenosis, 3 brain aneurysms, kidney transplant in 2022  are also affecting patient's functional outcome.   REHAB POTENTIAL:  Good  CLINICAL DECISION MAKING: Evolving/moderate complexity  EVALUATION COMPLEXITY: Moderate  PLAN:  PT FREQUENCY: 2x/week  PT DURATION: 6 weeks + 8 weeks (recert)  PLANNED INTERVENTIONS: 02835- PT Re-evaluation, 97110-Therapeutic exercises, 97530- Therapeutic activity, 97112- Neuromuscular re-education, 97535- Self Care, 02859- Manual therapy, [phone]- Gait training, [phone]- Orthotic Fit/training, [phone]- Aquatic Therapy, Patient/Family education, Balance training, Stair training, Dry Needling, Joint mobilization, Vestibular training, and DME instructions  PLAN FOR NEXT SESSION: Check BP. Add to HEP for improved spinal and R hip mobility, strength. Global endurance and functional strength (lifting), eccentric control, head turns, anticipatory balance, blaze pods, rebounder, rockerboard, scapular retraction, slam balls, scap push ups, upright rows, chin tucks    Cameron Katayama E Caprina Wussow, PT, DPT 01/13/2023, 9:43 AM

## 2023-01-18 ENCOUNTER — Ambulatory Visit: Payer: Medicare HMO | Admitting: Physical Therapy

## 2023-01-18 VITALS — BP 142/75 | HR 62

## 2023-01-18 DIAGNOSIS — M5431 Sciatica, right side: Secondary | ICD-10-CM | POA: Diagnosis not present

## 2023-01-18 DIAGNOSIS — M6281 Muscle weakness (generalized): Secondary | ICD-10-CM

## 2023-01-18 DIAGNOSIS — M5459 Other low back pain: Secondary | ICD-10-CM

## 2023-01-18 DIAGNOSIS — M542 Cervicalgia: Secondary | ICD-10-CM | POA: Diagnosis not present

## 2023-01-18 DIAGNOSIS — R2681 Unsteadiness on feet: Secondary | ICD-10-CM | POA: Diagnosis not present

## 2023-01-18 DIAGNOSIS — R2689 Other abnormalities of gait and mobility: Secondary | ICD-10-CM | POA: Diagnosis not present

## 2023-01-18 NOTE — Therapy (Signed)
 OUTPATIENT PHYSICAL THERAPY NEURO TREATMENT   Patient Name: Cristina Wilson MRN: [id number] DOB:1950-01-28, 73 y.o., female Today's Date: 01/18/2023   PCP: Roseann Coad, MD REFERRING PROVIDER: Dwight Isles, MD     END OF SESSION:  PT End of Session - 01/18/23 1236     Visit Number 15    Number of Visits 29   Recert   Date for PT Re-Evaluation 03/01/23   Recert   Authorization Type [employer]    PT Start Time 1231    PT Stop Time 1314    PT Time Calculation (min) 43 min    Activity Tolerance Patient tolerated treatment well    Behavior During Therapy [facility] for tasks assessed/performed                Past Medical History:  Diagnosis Date   Abdominal pain 06/06/2018   Acute renal failure (ARF) (HCC) 06/22/2018   Anemia in chronic kidney disease 08/25/2018   Anemia, unspecified 07/05/2018   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 states she has 2 aneurysms   Chronic low back pain 04/12/2017   Chronic pain syndrome 04/12/2017   Closed fracture of left distal radius    De Quervain's tenosynovitis 02/10/2018   Degeneration of lumbar intervertebral disc 03/01/2017   End stage renal failure on dialysis [facility])    M/W/F on [employer]   GERD (gastroesophageal reflux disease)    Goodpasture syndrome (HCC)    Headache 10/30/2014   Hyperlipidemia 04/17/2018   Hypertension    Hypothyroidism    Iron deficiency anemia, unspecified 07/12/2018   PONV (postoperative nausea and vomiting)    violent vomiting   Sacral back pain 05/25/2016   Scoliosis of lumbar spine 03/01/2017   Secondary hyperparathyroidism of renal origin (HCC) 08/22/2018   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   Thyroid  disease    Trigger finger of left thumb 02/10/2018   Past Surgical History:  Procedure Laterality Date   ARTERY BIOPSY Right 10/31/2014   Procedure: BIOPSY  TEMPORAL ARTERY-RIGHT;  Surgeon: Lynwood JONETTA Collum, MD;  Location: [facility] OR;  Service: Vascular;  Laterality: Right;   AV FISTULA PLACEMENT Left 10/11/2018   Procedure: left arm ARTERIOVENOUS (AV) FISTULA  creation;  Surgeon: Sheree Penne Bruckner, MD;  Location: [facility] OR;  Service: Vascular;  Laterality: Left;   BILATERAL CARPAL TUNNEL RELEASE     BIOPSY  04/29/2019   Procedure: BIOPSY;  Surgeon: Elicia Claw, MD;  Location: [facility];  Service: Gastroenterology;;   BIOPSY  11/05/2019   Procedure: BIOPSY;  Surgeon: Elicia Claw, MD;  Location: [facility];  Service: Gastroenterology;;   BREAST SURGERY Left    lumpectomy   BUNIONECTOMY Right    CHOLECYSTECTOMY N/A 10/13/2016   Procedure: LAPAROSCOPIC CHOLECYSTECTOMY WITH INTRAOPERATIVE CHOLANGIOGRAM;  Surgeon: Belinda Cough, MD;  Location: [facility] OR;  Service: General;  Laterality: N/A;   COLONOSCOPY WITH PROPOFOL  N/A 11/05/2019   Procedure: COLONOSCOPY WITH PROPOFOL ;  Surgeon: Elicia Claw, MD;  Location: [facility];  Service: Gastroenterology;  Laterality: N/A;   ESOPHAGOGASTRODUODENOSCOPY (EGD) WITH PROPOFOL  N/A 04/29/2019   Procedure: ESOPHAGOGASTRODUODENOSCOPY (EGD) WITH PROPOFOL ;  Surgeon: Elicia Claw, MD;  Location: [facility];  Service: Gastroenterology;  Laterality: N/A;   EYE SURGERY     surgery for cross eye   FOOT FRACTURE SURGERY     OPEN REDUCTION INTERNAL FIXATION (ORIF) DISTAL RADIAL FRACTURE Left  03/29/2017   Procedure: OPEN REDUCTION INTERNAL FIXATION (ORIF)LEFT  DISTAL RADIAL FRACTURE;  Surgeon: Murrell Drivers, MD;  Location: [facility];  Service: Orthopedics;  Laterality: Left;   POLYPECTOMY  11/05/2019   Procedure: POLYPECTOMY;  Surgeon: Elicia Claw, MD;  Location: [facility];  Service: Gastroenterology;;   THYROIDECTOMY     TONSILLECTOMY     WISDOM TOOTH EXTRACTION     Patient Active Problem List   Diagnosis Date Noted   Hypertensive crisis 10/23/2019   History of cerebral  aneurysm 10/23/2019   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   Hypoxemia 02/21/2019   Pulmonary edema 02/20/2019   Encounter for immunization 09/26/2018   Anemia in chronic kidney disease 08/25/2018   End stage renal disease (HCC) 08/25/2018   Secondary hyperparathyroidism of renal origin (HCC) 08/22/2018   Referred otalgia of left ear 08/16/2018   Iron deficiency anemia, unspecified 07/12/2018   Shortness of breath 07/06/2018   Anemia, unspecified 07/05/2018   Anxiety 07/05/2018   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   Acute renal failure (ARF) (HCC) 06/22/2018   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   Abdominal pain 06/06/2018   Hydronephrosis due to obstruction of ureteral orifice 06/06/2018   Disease of thyroid  gland 04/17/2018   Hyperlipidemia 04/17/2018   Hypertension 04/17/2018   De Quervain's tenosynovitis 02/10/2018   Trigger finger of left thumb 02/10/2018   Chronic low back pain 04/12/2017   Chronic pain syndrome 04/12/2017   Degeneration of lumbar intervertebral disc 03/01/2017   Scoliosis of lumbar spine 03/01/2017   Sacral back pain 05/25/2016   Eye pain 10/30/2014    ONSET DATE: 11/10/2022 (referral)   REFERRING DIAG:  F51.937 (ICD-10-CM) - Spinal stenosis, lumbar region with neurogenic claudication    THERAPY DIAG:  Muscle weakness (generalized)  Cervicalgia  Other low back pain  Rationale for Evaluation and Treatment: Rehabilitation  SUBJECTIVE:                                                                                                                                                                                             SUBJECTIVE STATEMENT: Pt reports she called Dr. Thayer office, still no updates on her surgery. Still having the burning pain down her RLE. Neck fluctuates from excruciating pain to no pain   Pt accompanied by: self  PERTINENT HISTORY: hypothyroidism; HTN; HLD; Goodpasture  syndrome; cerebral aneurysm; ESRD on TTS HD; and chronic pain, kidney replacement (12/14/2020).   PAIN:  Are you having pain? Yes: NPRS scale: 6 Pain location: RLE Pain description: Sciatica  PRECAUTIONS: Cervical and Fall  RED FLAGS: None   WEIGHT BEARING RESTRICTIONS:  No  FALLS: Has patient fallen in last 6 months? Yes. Number of falls %  LIVING ENVIRONMENT: Lives with:  her dog, Snickers  Lives in: House/apartment Stairs: Yes: External: 5 in carport w/bilateral rails and 2 in back w/no rails steps; see previous Has following equipment at home: Single point cane, Walker - 2 wheeled, shower chair, and walking sticks   PLOF: Independent  PATIENT GOALS: To establish mobility so that I do not fall and I feel confident   OBJECTIVE:  Note: Objective measures were completed at Evaluation unless otherwise noted.  DIAGNOSTIC FINDINGS: MRI of lumbar spine from 2022   Disc levels:   T12- L1: Unremarkable.   L1-L2: Ventral spondylitic spurring   L2-L3: Disc narrowing and bulging with mild right facet spurring.   L3-L4: Disc narrowing and bulging.  Mild facet spurring   L4-L5: Disc narrowing and bulging with left inferior foraminal protrusion. Asymmetric left facet spurring. Left foraminal stenosis is mild   L5-S1:Disc narrowing and bulging with endplate spurring eccentric to the left. Negative facets.   IMPRESSION: Generalized lumbar spine degeneration similar to 2017. No impingement to explain right leg symptoms.  COGNITION: Overall cognitive status: Within functional limits for tasks assessed   SENSATION: Shooting pain down her RLE that follows sciatic n. Distribution    POSTURE: rounded shoulders and forward head  LOWER EXTREMITY MMT:  Tested in seated position   MMT Right Eval Left Eval  Hip flexion 4- 4  Hip extension    Hip abduction 5 5  Hip adduction 5 5  Hip internal rotation    Hip external rotation    Knee flexion 4+ 5  Knee extension 5 5   Ankle dorsiflexion 4 5  Ankle plantarflexion    Ankle inversion    Ankle eversion    (Blank rows = not tested)   GAIT: Gait pattern:  Decreased eccentric control of LLE, step through pattern, decreased hip/knee flexion- Right, decreased ankle dorsiflexion- Right, lateral hip instability, and lateral lean- Left Distance walked: Various clinic distances  Assistive device utilized: None Level of assistance: SBA Comments: Pt demonstrates improved gait kinematics but continues to be limited by poor eccentric control of LLE w/fatigue and L lateral lean   VITALS Vitals:   01/18/23 1236  BP: (!) 142/75  Pulse: 62        TODAY'S TREATMENT:      Ther Act  Assessed vitals (see above) and within limits for therapy.   Ther Ex  Standing ITB stretch on R side, 2x30s holds, for improved mobility and pain relief. Added to HEP (see bolded below)  While therapist printing HEP, pt performed bird dog isometric holds, 2x30s per side.  Supine chin tucks, x10 reps, for improved cervical ROM. Pt denied pain w/movement  Supine cervical rotation w/5s hold, x8 reps per side, for improved cervical ROM.  Standing scapular retraction, x14 reps, for improved periscapular strength. Attempted in modified plantigrade position, but pt unable to tolerate.   Manual therapy  Suboccipital release, x10 minutes  Supine chin tuck w/suboccipital release, x15 reps w/5s hold. Manual SCM mobilization, 3 minutes on R side. Pt extremely tender here and unable to tolerate much more than light massage.     PATIENT EDUCATION: Education details: Additions to HEP  Person educated: Patient Education method: Explanation, Demonstration, and Handouts Education comprehension: verbalized understanding, returned demonstration, and needs further education  HOME EXERCISE PROGRAM: Verbally added seated march overs on eval   Access Code: [email or web address] URL: [email or web address] Date: 11/23/2022 Prepared by:  Abbygale Lapid  Exercises - Seated Piriformis Stretch with Trunk Bend  - 1 x daily - 7 x weekly - 3 sets - 30-45 second  hold - Supine Figure 4 Piriformis Stretch  - 1 x daily - 7 x weekly - 3 sets - 45-60 second hold - Supine Straight Leg Lumbar Rotation Stretch  - 1 x daily - 7 x weekly - 1 sets - 5-10 reps - 10-20 second hold - Seated Hamstring Stretch  - 1 x daily - 7 x weekly - 3 sets - 10 reps - Supine Butterfly Groin Stretch  - 1 x daily - 7 x weekly - 3 sets - 10 reps - Forward Step Down with Heel Tap and Rail Support  - 1 x daily - 7 x weekly - 3 sets - 10 reps - Side Step Down with Counter Support  - 1 x daily - 7 x weekly - 3 sets - 10 reps - Brandt-Daroff Vestibular Exercise  - 1 x daily - 7 x weekly - 3 sets - 10 reps - ITB Stretch at Wall  - 1 x daily - 7 x weekly - 3 sets - 30-60 second hold  GOALS: Goals reviewed with patient? Yes  SHORT TERM GOALS: Target date: 12/17/2022   Pt will be independent with initial  HEP for improved strength, balance, transfers and gait.  Baseline: Goal status: MET  2.  Pt will improve FGA to 18/30 for decreased fall risk   Baseline: 14/30; 27/30  Goal status: MET  3.  MCTSIB to be assessed and STG/LTG updated  Baseline:  Goal status: DC due to high baseline score   4.  Pt will ambulate greater than or equal to 1625' feet on with RPE of <7/10 for improved cardiovascular endurance and BLE strength.   Baseline: 1583' w/RPE of 8-10/10, no AD (11/25); 1600' w/RPE of 7-8/10 no AD  Goal status: PARTIALLY MET     LONG TERM GOALS: Target date: 12/31/2022    Pt will be independent with final HEP for improved strength, balance, transfers and gait.  Baseline:  Goal status: IN PROGRESS  2.  Pt will improve FGA to 22/30 for decreased fall risk   Baseline: 14/30; 27/30  Goal status: MET   3.  MCTSIB goal  Baseline:  Goal status: DC due to high baseline score   4.  Pt will ambulate greater than or equal to 1700 feet on with  <5/10 RPE for improved cardiovascular endurance and BLE strength.   Baseline: 1583' no AD RPE of 8-10/10 Goal status: IN PROGRESS   NEW SHORT TERM GOALS FOR UPDATED POC:   Target date: 02/01/2023  Pt will demonstrate proper lifting technique of 5# object w/no more than 4/10 pain for improved posterior chain strength and safety at home  Baseline: Hands braced on thighs, >8/10 pain  Goal status: INITIAL  2.  Pt will ambulate greater than or equal to 1700 feet on with <5/10 RPE for improved cardiovascular endurance and BLE strength.  Baseline: 1583' no AD RPE of 8-10/10 Goal status: IN PROGRESS   NEW LONG TERM GOALS FOR UPDATED POC:  Target date: 03/01/2023  Pt will be independent with final HEP for improved strength, balance, transfers and gait. Baseline:  Goal status: IN PROGRESS  2.  Pt will lift 5# from [facility] shelf w/proper form and no more than 4/10 pain for improved body mechanics and reduced pain levels  Baseline:  Goal status: INITIAL    ASSESSMENT:  CLINICAL IMPRESSION: Emphasis of skilled PT session on improved cervical A/ROM, pain modulation and periscapular strength. Pt continues to be limited by high pain levels in RLE and cervical spine (R side > L). Pt extremely tender along R SCM, so attempted to lightly mobilize and pt denied peripheralization of pain w/mobilization. Pt has still not been scheduled for surgery but has been calling [facility] and Dr. Colon to get updates. Continue POC.    OBJECTIVE IMPAIRMENTS: Abnormal gait, decreased activity tolerance, decreased balance, decreased endurance, decreased mobility, difficulty walking, decreased ROM, decreased strength, impaired flexibility, impaired sensation, postural dysfunction, and pain  ACTIVITY LIMITATIONS: lifting, standing, stairs, transfers, bed mobility, bathing, hygiene/grooming, locomotion level, and caring for others  PARTICIPATION LIMITATIONS: cleaning, laundry, shopping, community activity, and yard  work  PERSONAL FACTORS: Age, Fitness, Past/current experiences, and 1-2 comorbidities: Lumbar stenosis, 3 brain aneurysms, kidney transplant in 2022  are also affecting patient's functional outcome.   REHAB POTENTIAL: Good  CLINICAL DECISION MAKING: Evolving/moderate complexity  EVALUATION COMPLEXITY: Moderate  PLAN:  PT FREQUENCY: 2x/week  PT DURATION: 6 weeks + 8 weeks (recert)  PLANNED INTERVENTIONS: 02835- PT Re-evaluation, 97110-Therapeutic exercises, 97530- Therapeutic activity, 97112- Neuromuscular re-education, 97535- Self Care, 02859- Manual therapy, [phone]- Gait training, [phone]- Orthotic Fit/training, [phone]- Aquatic Therapy, Patient/Family education, Balance training, Stair training, Dry Needling, Joint mobilization, Vestibular training, and DME instructions  PLAN FOR NEXT SESSION: Check BP. Add to HEP for improved spinal and R hip mobility, strength. Global endurance and functional strength (lifting), eccentric control, head turns, anticipatory balance, blaze pods, rebounder, rockerboard, scapular retraction, slam balls, scap push ups, upright rows, chin tucks    Linkon Siverson E Ovide Dusek, PT, DPT 01/18/2023, 1:18 PM

## 2023-01-20 ENCOUNTER — Ambulatory Visit: Payer: Medicare HMO | Admitting: Physical Therapy

## 2023-01-20 VITALS — BP 124/95 | HR 60

## 2023-01-20 DIAGNOSIS — M542 Cervicalgia: Secondary | ICD-10-CM

## 2023-01-20 DIAGNOSIS — R2689 Other abnormalities of gait and mobility: Secondary | ICD-10-CM

## 2023-01-20 DIAGNOSIS — M6281 Muscle weakness (generalized): Secondary | ICD-10-CM | POA: Diagnosis not present

## 2023-01-20 DIAGNOSIS — M5459 Other low back pain: Secondary | ICD-10-CM

## 2023-01-20 DIAGNOSIS — M5431 Sciatica, right side: Secondary | ICD-10-CM | POA: Diagnosis not present

## 2023-01-20 DIAGNOSIS — R2681 Unsteadiness on feet: Secondary | ICD-10-CM | POA: Diagnosis not present

## 2023-01-20 NOTE — Therapy (Addendum)
OUTPATIENT PHYSICAL THERAPY NEURO TREATMENT   Patient Name: Cristina Wilson MRN: [id number] DOB:Jun 23, 1950, 73 y.o., female Today's Date: 01/20/2023   PCP: Merlene Laughter, MD REFERRING PROVIDER: Joaquim Nam, MD     END OF SESSION:  PT End of Session - 01/20/23 0935     Visit Number 16    Number of Visits 29   Recert   Date for PT Re-Evaluation 03/01/23   Recert   Authorization Type [employer]    PT Start Time 0933    PT Stop Time 1015    PT Time Calculation (min) 42 min    Activity Tolerance Patient tolerated treatment well    Behavior During Therapy [facility] for tasks assessed/performed                Past Medical History:  Diagnosis Date   Abdominal pain 06/06/2018   Acute renal failure (ARF) (HCC) 06/22/2018   Anemia in chronic kidney disease 08/25/2018   Anemia, unspecified 07/05/2018   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 states she has 2 aneurysms   Chronic low back pain 04/12/2017   Chronic pain syndrome 04/12/2017   Closed fracture of left distal radius    De Quervain's tenosynovitis 02/10/2018   Degeneration of lumbar intervertebral disc 03/01/2017   End stage renal failure on dialysis [facility])    M/W/F on [employer]   GERD (gastroesophageal reflux disease)    Goodpasture syndrome (HCC)    Headache 10/30/2014   Hyperlipidemia 04/17/2018   Hypertension    Hypothyroidism    Iron deficiency anemia, unspecified 07/12/2018   PONV (postoperative nausea and vomiting)    violent vomiting   Sacral back pain 05/25/2016   Scoliosis of lumbar spine 03/01/2017   Secondary hyperparathyroidism of renal origin (HCC) 08/22/2018   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   Thyroid disease    Trigger finger of left thumb 02/10/2018   Past Surgical History:  Procedure Laterality Date   ARTERY BIOPSY Right 10/31/2014   Procedure: BIOPSY  TEMPORAL ARTERY-RIGHT;  Surgeon: Pryor Ochoa, MD;  Location: [facility] OR;  Service: Vascular;  Laterality: Right;   AV FISTULA PLACEMENT Left 10/11/2018   Procedure: left arm ARTERIOVENOUS (AV) FISTULA  creation;  Surgeon: Maeola Harman, MD;  Location: [facility] OR;  Service: Vascular;  Laterality: Left;   BILATERAL CARPAL TUNNEL RELEASE     BIOPSY  04/29/2019   Procedure: BIOPSY;  Surgeon: Kathi Der, MD;  Location: [facility];  Service: Gastroenterology;;   BIOPSY  11/05/2019   Procedure: BIOPSY;  Surgeon: Kathi Der, MD;  Location: [facility];  Service: Gastroenterology;;   BREAST SURGERY Left    lumpectomy   BUNIONECTOMY Right    CHOLECYSTECTOMY N/A 10/13/2016   Procedure: LAPAROSCOPIC CHOLECYSTECTOMY WITH INTRAOPERATIVE CHOLANGIOGRAM;  Surgeon: Manus Rudd, MD;  Location: [facility] OR;  Service: General;  Laterality: N/A;   COLONOSCOPY WITH PROPOFOL N/A 11/05/2019   Procedure: COLONOSCOPY WITH PROPOFOL;  Surgeon: Kathi Der, MD;  Location: [facility];  Service: Gastroenterology;  Laterality: N/A;   ESOPHAGOGASTRODUODENOSCOPY (EGD) WITH PROPOFOL N/A 04/29/2019   Procedure: ESOPHAGOGASTRODUODENOSCOPY (EGD) WITH PROPOFOL;  Surgeon: Kathi Der, MD;  Location: [facility];  Service: Gastroenterology;  Laterality: N/A;   EYE SURGERY     surgery for cross eye   FOOT FRACTURE SURGERY     OPEN REDUCTION INTERNAL FIXATION (ORIF) DISTAL RADIAL FRACTURE Left  03/29/2017   Procedure: OPEN REDUCTION INTERNAL FIXATION (ORIF)LEFT  DISTAL RADIAL FRACTURE;  Surgeon: Betha Loa, MD;  Location: [facility];  Service: Orthopedics;  Laterality: Left;   POLYPECTOMY  11/05/2019   Procedure: POLYPECTOMY;  Surgeon: Kathi Der, MD;  Location: [facility];  Service: Gastroenterology;;   THYROIDECTOMY     TONSILLECTOMY     WISDOM TOOTH EXTRACTION     Patient Active Problem List   Diagnosis Date Noted   Hypertensive crisis 10/23/2019   History of cerebral  aneurysm 10/23/2019   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   Hypoxemia 02/21/2019   Pulmonary edema 2019/02/23   Encounter for immunization 09/26/2018   Anemia in chronic kidney disease 08/25/2018   End stage renal disease (HCC) 08/25/2018   Secondary hyperparathyroidism of renal origin (HCC) 08/22/2018   Referred otalgia of left ear 08/16/2018   Iron deficiency anemia, unspecified 07/12/2018   Shortness of breath 07/06/2018   Anemia, unspecified 07/05/2018   Anxiety 07/05/2018   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   Acute renal failure (ARF) (HCC) 06/22/2018   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   Abdominal pain 06/06/2018   Hydronephrosis due to obstruction of ureteral orifice 06/06/2018   Disease of thyroid gland 04/17/2018   Hyperlipidemia 04/17/2018   Hypertension 04/17/2018   De Quervain's tenosynovitis 02/10/2018   Trigger finger of left thumb 02/10/2018   Chronic low back pain 04/12/2017   Chronic pain syndrome 04/12/2017   Degeneration of lumbar intervertebral disc 03/01/2017   Scoliosis of lumbar spine 03/01/2017   Sacral back pain 05/25/2016   Eye pain 10/30/2014    ONSET DATE: 11/10/2022 (referral)   REFERRING DIAG:  W11.914 (ICD-10-CM) - Spinal stenosis, lumbar region with neurogenic claudication    THERAPY DIAG:  Muscle weakness (generalized)  Cervicalgia  Other abnormalities of gait and mobility  Other low back pain  Rationale for Evaluation and Treatment: Rehabilitation  SUBJECTIVE:                                                                                                                                                                                             SUBJECTIVE STATEMENT: Pt reports feeling good today. Neck felt "great" after last session. No falls.   Pt accompanied by: self  PERTINENT HISTORY: hypothyroidism; HTN; HLD; Goodpasture syndrome; cerebral aneurysm; ESRD on TTS HD; and  chronic pain, kidney replacement (12/14/2020).   PAIN:  Are you having pain? Yes: NPRS scale: 3-4 Pain location: RLE Pain description: Sciatica  PRECAUTIONS: Cervical and Fall  RED FLAGS: None   WEIGHT BEARING RESTRICTIONS: No  FALLS: Has patient fallen in last 6  months? Yes. Number of falls %  LIVING ENVIRONMENT: Lives with:  her dog, Snickers  Lives in: House/apartment Stairs: Yes: External: 5 in carport w/bilateral rails and 2 in back w/no rails steps; see previous Has following equipment at home: Single point cane, Walker - 2 wheeled, shower chair, and walking sticks   PLOF: Independent  PATIENT GOALS: "To establish mobility so that I do not fall and I feel confident"   OBJECTIVE:  Note: Objective measures were completed at Evaluation unless otherwise noted.  DIAGNOSTIC FINDINGS: MRI of lumbar spine from 2022   Disc levels:   T12- L1: Unremarkable.   L1-L2: Ventral spondylitic spurring   L2-L3: Disc narrowing and bulging with mild right facet spurring.   L3-L4: Disc narrowing and bulging.  Mild facet spurring   L4-L5: Disc narrowing and bulging with left inferior foraminal protrusion. Asymmetric left facet spurring. Left foraminal stenosis is mild   L5-S1:Disc narrowing and bulging with endplate spurring eccentric to the left. Negative facets.   IMPRESSION: Generalized lumbar spine degeneration similar to 2017. No impingement to explain right leg symptoms.  COGNITION: Overall cognitive status: Within functional limits for tasks assessed   SENSATION: Shooting pain down her RLE that follows sciatic n. Distribution    POSTURE: rounded shoulders and forward head  LOWER EXTREMITY MMT:  Tested in seated position   MMT Right Eval Left Eval  Hip flexion 4- 4  Hip extension    Hip abduction 5 5  Hip adduction 5 5  Hip internal rotation    Hip external rotation    Knee flexion 4+ 5  Knee extension 5 5  Ankle dorsiflexion 4 5  Ankle plantarflexion     Ankle inversion    Ankle eversion    (Blank rows = not tested)   GAIT: Gait pattern:  Decreased eccentric control of LLE, step through pattern, decreased hip/knee flexion- Right, decreased ankle dorsiflexion- Right, lateral hip instability, and lateral lean- Left Distance walked: Various clinic distances  Assistive device utilized: None Level of assistance: SBA Comments: Pt demonstrates improved gait kinematics but continues to be limited by poor eccentric control of LLE w/fatigue and L lateral lean   VITALS Vitals:   01/20/23 0940  BP: (!) 124/95  Pulse: 60         TODAY'S TREATMENT:      Ther Act  Assessed vitals (see above) and within limits for therapy.    Ther Ex  The following were performed for improved cervical ROM, periscapular strength and pain modulation:  Supine chin tucks w/5s hold, x12 reps. Added to HEP (see bolded below)  Seated upper trap stretch, x2 minutes per side.  Bent over single arm rows w/5# DB, x10 per side.  Standing shoulder shrugs w/5# dumbbells, x12 reps  Seated thoracic spine extension over foam roller, x5 minutes   Ther Act (cont.)/Manual Therapy  Supine suboccipital release, x5 minutes  Manual SCM mobilization, 3 minutes on R side. Pt extremely tender here and unable to tolerate much more than light massage.   Trigger Point Dry Needling  Initial Treatment: Pt instructed on Dry Needling rational, procedures, and possible side effects. Pt instructed to expect mild to moderate muscle soreness later in the day and/or into the next day.  Pt instructed in methods to reduce muscle soreness. Pt instructed to continue prescribed HEP. Patient was educated on signs and symptoms of infection and other risk factors and advised to seek medical attention should they occur.  Patient verbalized understanding of these instructions and  education.   Patient Verbal Consent Given: Yes Education Handout Provided: No verbally reviewed Muscles Treated: R  SCM Electrical Stimulation Performed: No Treatment Response/Outcome: deep ache/pressure; muscle twitch detected     PATIENT EDUCATION: Education details: Additions to HEP  Person educated: Patient Education method: Explanation, Demonstration, and Handouts Education comprehension: verbalized understanding, returned demonstration, and needs further education  HOME EXERCISE PROGRAM: Verbally added seated march overs on eval   Access Code: [email or web address] URL: [email or web address] Date: 11/23/2022 Prepared by: Alethia Berthold Berta Denson  Exercises - Seated Piriformis Stretch with Trunk Bend  - 1 x daily - 7 x weekly - 3 sets - 30-45 second  hold - Supine Figure 4 Piriformis Stretch  - 1 x daily - 7 x weekly - 3 sets - 45-60 second hold - Supine Straight Leg Lumbar Rotation Stretch  - 1 x daily - 7 x weekly - 1 sets - 5-10 reps - 10-20 second hold - Seated Hamstring Stretch  - 1 x daily - 7 x weekly - 3 sets - 10 reps - Supine Butterfly Groin Stretch  - 1 x daily - 7 x weekly - 3 sets - 10 reps - Forward Step Down with Heel Tap and Rail Support  - 1 x daily - 7 x weekly - 3 sets - 10 reps - Side Step Down with Counter Support  - 1 x daily - 7 x weekly - 3 sets - 10 reps - Brandt-Daroff Vestibular Exercise  - 1 x daily - 7 x weekly - 3 sets - 10 reps - ITB Stretch at Wall  - 1 x daily - 7 x weekly - 3 sets - 30-60 second hold - Supine Chin Tuck  - 1 x daily - 7 x weekly - 3 sets - 10 reps - 2-5 second hold - Chin Tuck  - 1 x daily - 7 x weekly - 3 sets - 10 reps - 2-5 second hold  GOALS: Goals reviewed with patient? Yes  SHORT TERM GOALS: Target date: 12/17/2022   Pt will be independent with initial  HEP for improved strength, balance, transfers and gait.  Baseline: Goal status: MET  2.  Pt will improve FGA to 18/30 for decreased fall risk   Baseline: 14/30; 27/30  Goal status: MET  3.  MCTSIB to be assessed and STG/LTG updated  Baseline:  Goal status: DC due to high baseline  score   4.  Pt will ambulate greater than or equal to 1625' feet on with RPE of <7/10 for improved cardiovascular endurance and BLE strength.   Baseline: 1583' w/RPE of 8-10/10, no AD (11/25); 1600' w/RPE of 7-8/10 no AD  Goal status: PARTIALLY MET     LONG TERM GOALS: Target date: 12/31/2022    Pt will be independent with final HEP for improved strength, balance, transfers and gait.  Baseline:  Goal status: IN PROGRESS  2.  Pt will improve FGA to 22/30 for decreased fall risk   Baseline: 14/30; 27/30  Goal status: MET   3.  MCTSIB goal  Baseline:  Goal status: DC due to high baseline score   4.  Pt will ambulate greater than or equal to 1700 feet on with <5/10 RPE for improved cardiovascular endurance and BLE strength.   Baseline: 1583' no AD RPE of 8-10/10 Goal status: IN PROGRESS   NEW SHORT TERM GOALS FOR UPDATED POC:   Target date: 02/01/2023  Pt will demonstrate proper lifting technique of 5# object w/no more than 4/10 pain for  improved posterior chain strength and safety at home  Baseline: Hands braced on thighs, >8/10 pain  Goal status: INITIAL  2.  Pt will ambulate greater than or equal to 1700 feet on with <5/10 RPE for improved cardiovascular endurance and BLE strength.  Baseline: 1583' no AD RPE of 8-10/10 Goal status: IN PROGRESS   NEW LONG TERM GOALS FOR UPDATED POC:  Target date: 03/01/2023  Pt will be independent with final HEP for improved strength, balance, transfers and gait. Baseline:  Goal status: IN PROGRESS  2.  Pt will lift 5# from [facility] shelf w/proper form and no more than 4/10 pain for improved body mechanics and reduced pain levels  Baseline:  Goal status: INITIAL    ASSESSMENT:  CLINICAL IMPRESSION: Emphasis of skilled PT session on pain modulation via TPDN, improved cervical ROM and periscapular strength. Pt reports her neck felt "great" following previous session, so added chin tucks to HEP and incorporated cervical  distraction into session. Advised against pt obtaining cervical distraction device at home as pt is to have surgery soon. Pt continues to be extremely sensitive at R SCM but tolerated TPDN well. Continue POC.    OBJECTIVE IMPAIRMENTS: Abnormal gait, decreased activity tolerance, decreased balance, decreased endurance, decreased mobility, difficulty walking, decreased ROM, decreased strength, impaired flexibility, impaired sensation, postural dysfunction, and pain  ACTIVITY LIMITATIONS: lifting, standing, stairs, transfers, bed mobility, bathing, hygiene/grooming, locomotion level, and caring for others  PARTICIPATION LIMITATIONS: cleaning, laundry, shopping, community activity, and yard work  PERSONAL FACTORS: Age, Fitness, Past/current experiences, and 1-2 comorbidities: Lumbar stenosis, 3 brain aneurysms, kidney transplant in 2022  are also affecting patient's functional outcome.   REHAB POTENTIAL: Good  CLINICAL DECISION MAKING: Evolving/moderate complexity  EVALUATION COMPLEXITY: Moderate  PLAN:  PT FREQUENCY: 2x/week  PT DURATION: 6 weeks + 8 weeks (recert)  PLANNED INTERVENTIONS: 16109- PT Re-evaluation, 97110-Therapeutic exercises, 97530- Therapeutic activity, 97112- Neuromuscular re-education, 97535- Self Care, [id number]- Manual therapy, [phone]- Gait training, [phone]- Orthotic Fit/training, [phone]- Aquatic Therapy, Patient/Family education, Balance training, Stair training, Dry Needling, Joint mobilization, Vestibular training, and DME instructions  PLAN FOR NEXT SESSION: How is neck? Check BP. Add to HEP for improved spinal and R hip mobility, strength. Global endurance and functional strength (lifting), eccentric control, head turns, anticipatory balance, blaze pods, rebounder, rockerboard, scapular retraction, slam balls, scap push ups, upright rows, chin tucks    Jill Alexanders [place] Frieden, PT, DPT Peter Congo, PT, DPT, CSRS  01/20/2023, 10:33 AM

## 2023-01-25 ENCOUNTER — Ambulatory Visit: Payer: Medicare HMO | Admitting: Physical Therapy

## 2023-01-25 VITALS — BP 129/60 | HR 61

## 2023-01-25 DIAGNOSIS — M542 Cervicalgia: Secondary | ICD-10-CM

## 2023-01-25 DIAGNOSIS — M6281 Muscle weakness (generalized): Secondary | ICD-10-CM | POA: Diagnosis not present

## 2023-01-25 DIAGNOSIS — M5459 Other low back pain: Secondary | ICD-10-CM | POA: Diagnosis not present

## 2023-01-25 DIAGNOSIS — R2681 Unsteadiness on feet: Secondary | ICD-10-CM | POA: Diagnosis not present

## 2023-01-25 DIAGNOSIS — R2689 Other abnormalities of gait and mobility: Secondary | ICD-10-CM

## 2023-01-25 DIAGNOSIS — M5431 Sciatica, right side: Secondary | ICD-10-CM | POA: Diagnosis not present

## 2023-01-25 NOTE — Therapy (Addendum)
OUTPATIENT PHYSICAL THERAPY NEURO TREATMENT   Patient Name: Cristina Wilson MRN: [id number] DOB:03/10/1950, 73 y.o., female Today's Date: 01/25/2023   PCP: Merlene Laughter, MD REFERRING PROVIDER: Joaquim Nam, MD     END OF SESSION:  PT End of Session - 01/25/23 1105     Visit Number 17    Number of Visits 29   Recert   Date for PT Re-Evaluation 03/01/23   Recert   Authorization Type [employer]    PT Start Time 1103    PT Stop Time 1146    PT Time Calculation (min) 43 min    Activity Tolerance Patient limited by pain    Behavior During Therapy [facility] for tasks assessed/performed                Past Medical History:  Diagnosis Date   Abdominal pain 06/06/2018   Acute renal failure (ARF) (HCC) 06/22/2018   Anemia in chronic kidney disease 08/25/2018   Anemia, unspecified 07/05/2018   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 states she has 2 aneurysms   Chronic low back pain 04/12/2017   Chronic pain syndrome 04/12/2017   Closed fracture of left distal radius    De Quervain's tenosynovitis 02/10/2018   Degeneration of lumbar intervertebral disc 03/01/2017   End stage renal failure on dialysis [facility])    M/W/F on [employer]   GERD (gastroesophageal reflux disease)    Goodpasture syndrome (HCC)    Headache 10/30/2014   Hyperlipidemia 04/17/2018   Hypertension    Hypothyroidism    Iron deficiency anemia, unspecified 07/12/2018   PONV (postoperative nausea and vomiting)    violent vomiting   Sacral back pain 05/25/2016   Scoliosis of lumbar spine 03/01/2017   Secondary hyperparathyroidism of renal origin (HCC) 08/22/2018   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   Thyroid disease    Trigger finger of left thumb 02/10/2018   Past Surgical History:  Procedure Laterality Date   ARTERY BIOPSY Right 10/31/2014   Procedure: BIOPSY TEMPORAL  ARTERY-RIGHT;  Surgeon: Pryor Ochoa, MD;  Location: [facility] OR;  Service: Vascular;  Laterality: Right;   AV FISTULA PLACEMENT Left 10/11/2018   Procedure: left arm ARTERIOVENOUS (AV) FISTULA  creation;  Surgeon: Maeola Harman, MD;  Location: [facility] OR;  Service: Vascular;  Laterality: Left;   BILATERAL CARPAL TUNNEL RELEASE     BIOPSY  04/29/2019   Procedure: BIOPSY;  Surgeon: Kathi Der, MD;  Location: [facility] END[facility]COPY;  Service: Gastroenterology;;   BIOPSY  11/05/2019   Procedure: BIOPSY;  Surgeon: Kathi Der, MD;  Location: [facility];  Service: Gastroenterology;;   BREAST SURGERY Left    lumpectomy   BUNIONECTOMY Right    CHOLECYSTECTOMY N/A 10/13/2016   Procedure: LAPAROSCOPIC CHOLECYSTECTOMY WITH INTRAOPERATIVE CHOLANGIOGRAM;  Surgeon: Manus Rudd, MD;  Location: [facility] OR;  Service: General;  Laterality: N/A;   COLONOSCOPY WITH PROPOFOL N/A 11/05/2019   Procedure: COLONOSCOPY WITH PROPOFOL;  Surgeon: Kathi Der, MD;  Location: [facility];  Service: Gastroenterology;  Laterality: N/A;   ESOPHAGOGASTRODUODENOSCOPY (EGD) WITH PROPOFOL N/A 04/29/2019   Procedure: ESOPHAGOGASTRODUODENOSCOPY (EGD) WITH PROPOFOL;  Surgeon: Kathi Der, MD;  Location: [facility];  Service: Gastroenterology;  Laterality: N/A;   EYE SURGERY     surgery for cross eye   FOOT FRACTURE SURGERY     OPEN REDUCTION INTERNAL FIXATION (ORIF) DISTAL RADIAL FRACTURE Left  03/29/2017   Procedure: OPEN REDUCTION INTERNAL FIXATION (ORIF)LEFT  DISTAL RADIAL FRACTURE;  Surgeon: Betha Loa, MD;  Location: [facility];  Service: Orthopedics;  Laterality: Left;   POLYPECTOMY  11/05/2019   Procedure: POLYPECTOMY;  Surgeon: Kathi Der, MD;  Location: [facility];  Service: Gastroenterology;;   THYROIDECTOMY     TONSILLECTOMY     WISDOM TOOTH EXTRACTION     Patient Active Problem List   Diagnosis Date Noted   Hypertensive crisis 10/23/2019   History of cerebral aneurysm  10/23/2019   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   Hypoxemia 02/21/2019   Pulmonary edema 03-07-2019   Encounter for immunization 09/26/2018   Anemia in chronic kidney disease 08/25/2018   End stage renal disease (HCC) 08/25/2018   Secondary hyperparathyroidism of renal origin (HCC) 08/22/2018   Referred otalgia of left ear 08/16/2018   Iron deficiency anemia, unspecified 07/12/2018   Shortness of breath 07/06/2018   Anemia, unspecified 07/05/2018   Anxiety 07/05/2018   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   Acute renal failure (ARF) (HCC) 06/22/2018   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   Abdominal pain 06/06/2018   Hydronephrosis due to obstruction of ureteral orifice 06/06/2018   Disease of thyroid gland 04/17/2018   Hyperlipidemia 04/17/2018   Hypertension 04/17/2018   De Quervain's tenosynovitis 02/10/2018   Trigger finger of left thumb 02/10/2018   Chronic low back pain 04/12/2017   Chronic pain syndrome 04/12/2017   Degeneration of lumbar intervertebral disc 03/01/2017   Scoliosis of lumbar spine 03/01/2017   Sacral back pain 05/25/2016   Eye pain 10/30/2014    ONSET DATE: 11/10/2022 (referral)   REFERRING DIAG:  V78.469 (ICD-10-CM) - Spinal stenosis, lumbar region with neurogenic claudication    THERAPY DIAG:  Muscle weakness (generalized)  Cervicalgia  Other abnormalities of gait and mobility  Unsteadiness on feet  Other low back pain  Rationale for Evaluation and Treatment: Rehabilitation  SUBJECTIVE:                                                                                                                                                                                             SUBJECTIVE STATEMENT: Pt reports feeling good right now. Yesterday was a hard day. Neck feels better in the morning but she is miserable by night. Did not sleep well last night.   Pt accompanied by: self  PERTINENT HISTORY:  hypothyroidism; HTN; HLD; Goodpasture syndrome; cerebral aneurysm; ESRD on TTS HD; and chronic pain, kidney replacement (12/14/2020).   PAIN:  Are you having pain? Yes: NPRS scale: 4-6 Pain location: RLE, low back and neck Pain description:  achy, throbbing   PRECAUTIONS: Cervical and Fall  RED FLAGS: None   WEIGHT BEARING RESTRICTIONS: No  FALLS: Has patient fallen in last 6 months? Yes. Number of falls %  LIVING ENVIRONMENT: Lives with:  her dog, Snickers  Lives in: House/apartment Stairs: Yes: External: 5 in carport w/bilateral rails and 2 in back w/no rails steps; see previous Has following equipment at home: Single point cane, Walker - 2 wheeled, shower chair, and walking sticks   PLOF: Independent  PATIENT GOALS: "To establish mobility so that I do not fall and I feel confident"   OBJECTIVE:  Note: Objective measures were completed at Evaluation unless otherwise noted.  DIAGNOSTIC FINDINGS: MRI of lumbar spine from 2022   Disc levels:   T12- L1: Unremarkable.   L1-L2: Ventral spondylitic spurring   L2-L3: Disc narrowing and bulging with mild right facet spurring.   L3-L4: Disc narrowing and bulging.  Mild facet spurring   L4-L5: Disc narrowing and bulging with left inferior foraminal protrusion. Asymmetric left facet spurring. Left foraminal stenosis is mild   L5-S1:Disc narrowing and bulging with endplate spurring eccentric to the left. Negative facets.   IMPRESSION: Generalized lumbar spine degeneration similar to 2017. No impingement to explain right leg symptoms.  COGNITION: Overall cognitive status: Within functional limits for tasks assessed   SENSATION: Shooting pain down her RLE that follows sciatic n. Distribution    POSTURE: rounded shoulders and forward head  LOWER EXTREMITY MMT:  Tested in seated position   MMT Right Eval Left Eval  Hip flexion 4- 4  Hip extension    Hip abduction 5 5  Hip adduction 5 5  Hip internal rotation     Hip external rotation    Knee flexion 4+ 5  Knee extension 5 5  Ankle dorsiflexion 4 5  Ankle plantarflexion    Ankle inversion    Ankle eversion    (Blank rows = not tested)   GAIT: Gait pattern:  Decreased eccentric control of LLE, step through pattern, decreased hip/knee flexion- Right, decreased ankle dorsiflexion- Right, lateral hip instability, and lateral lean- Left Distance walked: Various clinic distances  Assistive device utilized: None Level of assistance: SBA Comments: Pt demonstrates improved gait kinematics but continues to be limited by poor eccentric control of LLE w/fatigue and L lateral lean   VITALS Vitals:   01/25/23 1111  BP: 129/60  Pulse: 61      TODAY'S TREATMENT:      Ther Act/manual therapy  Assessed vitals (see above) and within limits for therapy.  Manual SCM and Upper trap mobilization on R side. Pt extremely tender here and unable to tolerate much more than light massage but was agreeable to TPDN.   Trigger Point Dry Needling  Subsequent Treatment: Instructions provided previously at initial dry needling treatment.  Instructions reviewed, if requested by the patient, prior to subsequent dry needling treatment.   Patient Verbal Consent Given: Yes Education Handout Provided: No verbally reviewed Muscles Treated: R Upper trap, R levator scap Electrical Stimulation Performed: No Treatment Response/Outcome: deep ache/pressure; muscle twitch detected   Ther Ex  Seated chin tucks, x10 reps, for improved cervical ROM. No pain reported with movement.   Seated DL w/orange resistance band, x20 reps, for improved posterior chain and paraspinal strength. Pt reported feeling pull in R hip w/spinal flexion, but pain did not increase or decrease w/activity.  Pallof presses rotations w/orange resistance band, x12 reps per side, for improved functional core strength stability w/spinal rotation. Pt performed well  w/arms extended when rotating to L side, but  unable to perform w/arms extended when rotating to R side due to shoulder pain, so modified to having elbows flexed and pt able to tolerate better.   PATIENT EDUCATION: Education details: Continue HEP  Person educated: Patient Education method: Medical illustrator Education comprehension: verbalized understanding, returned demonstration, and needs further education  HOME EXERCISE PROGRAM: Verbally added seated march overs on eval   Access Code: [email or web address] URL: [email or web address] Date: 11/23/2022 Prepared by: Alethia Berthold Colsen Modi  Exercises - Seated Piriformis Stretch with Trunk Bend  - 1 x daily - 7 x weekly - 3 sets - 30-45 second  hold - Supine Figure 4 Piriformis Stretch  - 1 x daily - 7 x weekly - 3 sets - 45-60 second hold - Supine Straight Leg Lumbar Rotation Stretch  - 1 x daily - 7 x weekly - 1 sets - 5-10 reps - 10-20 second hold - Seated Hamstring Stretch  - 1 x daily - 7 x weekly - 3 sets - 10 reps - Supine Butterfly Groin Stretch  - 1 x daily - 7 x weekly - 3 sets - 10 reps - Forward Step Down with Heel Tap and Rail Support  - 1 x daily - 7 x weekly - 3 sets - 10 reps - Side Step Down with Counter Support  - 1 x daily - 7 x weekly - 3 sets - 10 reps - Brandt-Daroff Vestibular Exercise  - 1 x daily - 7 x weekly - 3 sets - 10 reps - ITB Stretch at Wall  - 1 x daily - 7 x weekly - 3 sets - 30-60 second hold - Supine Chin Tuck  - 1 x daily - 7 x weekly - 3 sets - 10 reps - 2-5 second hold - Chin Tuck  - 1 x daily - 7 x weekly - 3 sets - 10 reps - 2-5 second hold  GOALS: Goals reviewed with patient? Yes  SHORT TERM GOALS: Target date: 12/17/2022   Pt will be independent with initial  HEP for improved strength, balance, transfers and gait.  Baseline: Goal status: MET  2.  Pt will improve FGA to 18/30 for decreased fall risk   Baseline: 14/30; 27/30  Goal status: MET  3.  MCTSIB to be assessed and STG/LTG updated  Baseline:  Goal status: DC due  to high baseline score   4.  Pt will ambulate greater than or equal to 1625' feet on with RPE of <7/10 for improved cardiovascular endurance and BLE strength.   Baseline: 1583' w/RPE of 8-10/10, no AD (11/25); 1600' w/RPE of 7-8/10 no AD  Goal status: PARTIALLY MET     LONG TERM GOALS: Target date: 12/31/2022    Pt will be independent with final HEP for improved strength, balance, transfers and gait.  Baseline:  Goal status: IN PROGRESS  2.  Pt will improve FGA to 22/30 for decreased fall risk   Baseline: 14/30; 27/30  Goal status: MET   3.  MCTSIB goal  Baseline:  Goal status: DC due to high baseline score   4.  Pt will ambulate greater than or equal to 1700 feet on with <5/10 RPE for improved cardiovascular endurance and BLE strength.   Baseline: 1583' no AD RPE of 8-10/10 Goal status: IN PROGRESS   NEW SHORT TERM GOALS FOR UPDATED POC:   Target date: 02/01/2023  Pt will demonstrate proper lifting technique of 5# object w/no more than 4/10 pain  for improved posterior chain strength and safety at home  Baseline: Hands braced on thighs, >8/10 pain  Goal status: INITIAL  2.  Pt will ambulate greater than or equal to 1700 feet on with <5/10 RPE for improved cardiovascular endurance and BLE strength.  Baseline: 1583' no AD RPE of 8-10/10 Goal status: IN PROGRESS   NEW LONG TERM GOALS FOR UPDATED POC:  Target date: 03/01/2023  Pt will be independent with final HEP for improved strength, balance, transfers and gait. Baseline:  Goal status: IN PROGRESS  2.  Pt will lift 5# from [facility] shelf w/proper form and no more than 4/10 pain for improved body mechanics and reduced pain levels  Baseline:  Goal status: INITIAL    ASSESSMENT:  CLINICAL IMPRESSION: Emphasis of skilled PT session on pain modulation via TPDN, improved cervical ROM, core stability and posterior chain strength. Pt continues to be limited by high levels of cervical spine and low back pain, but  tolerated session as able. Pt still waiting on insurance approval for cervical surgery. Pt would like to keep working on her R hip as she has noticed a big difference in her balance and mobility. Continue POC.    OBJECTIVE IMPAIRMENTS: Abnormal gait, decreased activity tolerance, decreased balance, decreased endurance, decreased mobility, difficulty walking, decreased ROM, decreased strength, impaired flexibility, impaired sensation, postural dysfunction, and pain  ACTIVITY LIMITATIONS: lifting, standing, stairs, transfers, bed mobility, bathing, hygiene/grooming, locomotion level, and caring for others  PARTICIPATION LIMITATIONS: cleaning, laundry, shopping, community activity, and yard work  PERSONAL FACTORS: Age, Fitness, Past/current experiences, and 1-2 comorbidities: Lumbar stenosis, 3 brain aneurysms, kidney transplant in 2022  are also affecting patient's functional outcome.   REHAB POTENTIAL: Good  CLINICAL DECISION MAKING: Evolving/moderate complexity  EVALUATION COMPLEXITY: Moderate  PLAN:  PT FREQUENCY: 2x/week  PT DURATION: 6 weeks + 8 weeks (recert)  PLANNED INTERVENTIONS: 78469- PT Re-evaluation, 97110-Therapeutic exercises, 97530- Therapeutic activity, 97112- Neuromuscular re-education, 97535- Self Care, 62952- Manual therapy, [phone]- Gait training, [phone]- Orthotic Fit/training, [phone]- Aquatic Therapy, Patient/Family education, Balance training, Stair training, Dry Needling, Joint mobilization, Vestibular training, and DME instructions  PLAN FOR NEXT SESSION: How is neck? Check BP. Add to HEP for improved spinal and R hip mobility, strength. Global endurance and functional strength (lifting), eccentric control, head turns, anticipatory balance, blaze pods, rebounder, rockerboard, scapular retraction, slam balls, scap push ups, upright rows, chin tucks    Jill Alexanders Tailey Top, PT, DPT Peter Congo, PT, DPT, CSRS   01/25/2023, 11:46 AM

## 2023-01-27 ENCOUNTER — Ambulatory Visit: Payer: Medicare HMO | Admitting: Physical Therapy

## 2023-01-27 VITALS — BP 144/81 | HR 57

## 2023-01-27 DIAGNOSIS — M5431 Sciatica, right side: Secondary | ICD-10-CM | POA: Diagnosis not present

## 2023-01-27 DIAGNOSIS — R2681 Unsteadiness on feet: Secondary | ICD-10-CM | POA: Diagnosis not present

## 2023-01-27 DIAGNOSIS — M5459 Other low back pain: Secondary | ICD-10-CM | POA: Diagnosis not present

## 2023-01-27 DIAGNOSIS — M6281 Muscle weakness (generalized): Secondary | ICD-10-CM | POA: Diagnosis not present

## 2023-01-27 DIAGNOSIS — R2689 Other abnormalities of gait and mobility: Secondary | ICD-10-CM | POA: Diagnosis not present

## 2023-01-27 DIAGNOSIS — M542 Cervicalgia: Secondary | ICD-10-CM | POA: Diagnosis not present

## 2023-01-27 NOTE — Therapy (Signed)
OUTPATIENT PHYSICAL THERAPY NEURO TREATMENT   Patient Name: Cristina Wilson MRN: [id number] DOB:01/02/1951, 73 y.o., female Today's Date: 01/27/2023   PCP: Merlene Laughter, MD REFERRING PROVIDER: Joaquim Nam, MD     END OF SESSION:  PT End of Session - 01/27/23 1105     Visit Number 18    Number of Visits 29   Recert   Date for PT Re-Evaluation 03/01/23   Recert   Authorization Type [employer]    PT Start Time 1103    PT Stop Time 1145    PT Time Calculation (min) 42 min    Activity Tolerance Patient tolerated treatment well    Behavior During Therapy WFL for tasks assessed/performed                Past Medical History:  Diagnosis Date   Abdominal pain 06/06/2018   Acute renal failure (ARF) (HCC) 06/22/2018   Anemia in chronic kidney disease 08/25/2018   Anemia, unspecified 07/05/2018   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 states she has 2 aneurysms   Chronic low back pain 04/12/2017   Chronic pain syndrome 04/12/2017   Closed fracture of left distal radius    De Quervain's tenosynovitis 02/10/2018   Degeneration of lumbar intervertebral disc 03/01/2017   End stage renal failure on dialysis [facility])    M/W/F on [employer]   GERD (gastroesophageal reflux disease)    Goodpasture syndrome (HCC)    Headache 10/30/2014   Hyperlipidemia 04/17/2018   Hypertension    Hypothyroidism    Iron deficiency anemia, unspecified 07/12/2018   PONV (postoperative nausea and vomiting)    violent vomiting   Sacral back pain 05/25/2016   Scoliosis of lumbar spine 03/01/2017   Secondary hyperparathyroidism of renal origin (HCC) 08/22/2018   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   Thyroid disease    Trigger finger of left thumb 02/10/2018   Past Surgical History:  Procedure Laterality Date   ARTERY BIOPSY Right 10/31/2014   Procedure: BIOPSY  TEMPORAL ARTERY-RIGHT;  Surgeon: Pryor Ochoa, MD;  Location: [facility] OR;  Service: Vascular;  Laterality: Right;   AV FISTULA PLACEMENT Left 10/11/2018   Procedure: left arm ARTERIOVENOUS (AV) FISTULA  creation;  Surgeon: Maeola Harman, MD;  Location: [facility] OR;  Service: Vascular;  Laterality: Left;   BILATERAL CARPAL TUNNEL RELEASE     BIOPSY  04/29/2019   Procedure: BIOPSY;  Surgeon: Kathi Der, MD;  Location: [facility] END[facility]COPY;  Service: Gastroenterology;;   BIOPSY  11/05/2019   Procedure: BIOPSY;  Surgeon: Kathi Der, MD;  Location: [facility];  Service: Gastroenterology;;   BREAST SURGERY Left    lumpectomy   BUNIONECTOMY Right    CHOLECYSTECTOMY N/A 10/13/2016   Procedure: LAPAROSCOPIC CHOLECYSTECTOMY WITH INTRAOPERATIVE CHOLANGIOGRAM;  Surgeon: Manus Rudd, MD;  Location: [facility] OR;  Service: General;  Laterality: N/A;   COLONOSCOPY WITH PROPOFOL N/A 11/05/2019   Procedure: COLONOSCOPY WITH PROPOFOL;  Surgeon: Kathi Der, MD;  Location: [facility];  Service: Gastroenterology;  Laterality: N/A;   ESOPHAGOGASTRODUODENOSCOPY (EGD) WITH PROPOFOL N/A 04/29/2019   Procedure: ESOPHAGOGASTRODUODENOSCOPY (EGD) WITH PROPOFOL;  Surgeon: Kathi Der, MD;  Location: [facility];  Service: Gastroenterology;  Laterality: N/A;   EYE SURGERY     surgery for cross eye   FOOT FRACTURE SURGERY     OPEN REDUCTION INTERNAL FIXATION (ORIF) DISTAL RADIAL FRACTURE Left  03/29/2017   Procedure: OPEN REDUCTION INTERNAL FIXATION (ORIF)LEFT  DISTAL RADIAL FRACTURE;  Surgeon: Betha Loa, MD;  Location: [facility];  Service: Orthopedics;  Laterality: Left;   POLYPECTOMY  11/05/2019   Procedure: POLYPECTOMY;  Surgeon: Kathi Der, MD;  Location: [facility];  Service: Gastroenterology;;   THYROIDECTOMY     TONSILLECTOMY     WISDOM TOOTH EXTRACTION     Patient Active Problem List   Diagnosis Date Noted   Hypertensive crisis 10/23/2019   History of cerebral  aneurysm 10/23/2019   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   Hypoxemia 02/21/2019   Pulmonary edema 03-Mar-2019   Encounter for immunization 09/26/2018   Anemia in chronic kidney disease 08/25/2018   End stage renal disease (HCC) 08/25/2018   Secondary hyperparathyroidism of renal origin (HCC) 08/22/2018   Referred otalgia of left ear 08/16/2018   Iron deficiency anemia, unspecified 07/12/2018   Shortness of breath 07/06/2018   Anemia, unspecified 07/05/2018   Anxiety 07/05/2018   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   Acute renal failure (ARF) (HCC) 06/22/2018   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   Abdominal pain 06/06/2018   Hydronephrosis due to obstruction of ureteral orifice 06/06/2018   Disease of thyroid gland 04/17/2018   Hyperlipidemia 04/17/2018   Hypertension 04/17/2018   De Quervain's tenosynovitis 02/10/2018   Trigger finger of left thumb 02/10/2018   Chronic low back pain 04/12/2017   Chronic pain syndrome 04/12/2017   Degeneration of lumbar intervertebral disc 03/01/2017   Scoliosis of lumbar spine 03/01/2017   Sacral back pain 05/25/2016   Eye pain 10/30/2014    ONSET DATE: 11/10/2022 (referral)   REFERRING DIAG:  Z61.096 (ICD-10-CM) - Spinal stenosis, lumbar region with neurogenic claudication    THERAPY DIAG:  Muscle weakness (generalized)  Other abnormalities of gait and mobility  Unsteadiness on feet  Other low back pain  Rationale for Evaluation and Treatment: Rehabilitation  SUBJECTIVE:                                                                                                                                                                                             SUBJECTIVE STATEMENT: Pt reports feeling good today. R hip was very painful after last session, had to take tramadol for the pain. Thinks the rotation caused it. Pain is better today   Pt accompanied by: self  PERTINENT HISTORY:  hypothyroidism; HTN; HLD; Goodpasture syndrome; cerebral aneurysm; ESRD on TTS HD; and chronic pain, kidney replacement (12/14/2020).   PAIN:  Are you having pain? Yes: NPRS scale: 4-6 Pain location: RLE, low back and neck Pain description: achy, throbbing  PRECAUTIONS: Cervical and Fall  RED FLAGS: None   WEIGHT BEARING RESTRICTIONS: No  FALLS: Has patient fallen in last 6 months? Yes. Number of falls %  LIVING ENVIRONMENT: Lives with:  her dog, Snickers  Lives in: House/apartment Stairs: Yes: External: 5 in carport w/bilateral rails and 2 in back w/no rails steps; see previous Has following equipment at home: Single point cane, Walker - 2 wheeled, shower chair, and walking sticks   PLOF: Independent  PATIENT GOALS: "To establish mobility so that I do not fall and I feel confident"   OBJECTIVE:  Note: Objective measures were completed at Evaluation unless otherwise noted.  DIAGNOSTIC FINDINGS: MRI of lumbar spine from 2022   Disc levels:   T12- L1: Unremarkable.   L1-L2: Ventral spondylitic spurring   L2-L3: Disc narrowing and bulging with mild right facet spurring.   L3-L4: Disc narrowing and bulging.  Mild facet spurring   L4-L5: Disc narrowing and bulging with left inferior foraminal protrusion. Asymmetric left facet spurring. Left foraminal stenosis is mild   L5-S1:Disc narrowing and bulging with endplate spurring eccentric to the left. Negative facets.   IMPRESSION: Generalized lumbar spine degeneration similar to 2017. No impingement to explain right leg symptoms.  COGNITION: Overall cognitive status: Within functional limits for tasks assessed   SENSATION: Shooting pain down her RLE that follows sciatic n. Distribution    POSTURE: rounded shoulders and forward head  LOWER EXTREMITY MMT:  Tested in seated position   MMT Right Eval Left Eval  Hip flexion 4- 4  Hip extension    Hip abduction 5 5  Hip adduction 5 5  Hip internal rotation     Hip external rotation    Knee flexion 4+ 5  Knee extension 5 5  Ankle dorsiflexion 4 5  Ankle plantarflexion    Ankle inversion    Ankle eversion    (Blank rows = not tested)   GAIT: Gait pattern:  Decreased eccentric control of LLE, step through pattern, decreased hip/knee flexion- Right, decreased ankle dorsiflexion- Right, lateral hip instability, and lateral lean- Left Distance walked: Various clinic distances  Assistive device utilized: None Level of assistance: SBA Comments: Pt demonstrates improved gait kinematics but continues to be limited by poor eccentric control of LLE w/fatigue and L lateral lean   VITALS Vitals:   01/27/23 1108 01/27/23 1111  BP: (!) 148/98 (!) 144/81  Pulse: (!) 59 (!) 57       TODAY'S TREATMENT:      Ther Act/manual therapy  Assessed vitals (see above) and diastolic BP initially elevated but did reduce w/seated rest to be within limits for therapy.  Manual SCM and Upper trap mobilization bilaterally. Pt not symptomatic today and tolerated well. Noted decreased tightness of SCM on R side and no trigger point found in upper trap this date.  Sub-occipital release x5 minutes. Pt reported good relief with this.   Ther Ex  Supine chin tucks, x10 reps, for improved cervical ROM. No pain reported with movement.   Lateral/fwd/retro monster walks at ballet bar w/red theraband around ankles, x30' each direction for improved functional hip strength. Min cues for wide BOS throughout. Intermittent UE support required but no LOB noted  Modified deadlift to 12" box w/10# KB, x15 reps, for improved posterior chain strength and facilitation of hip hinge. Pt reported difficulty w/her balance w/this but performed well, no pain reported.  Standing goodmornings w/PVC pipe, x12 reps, for improved posterior chain mobilization and facilitation of hip hinge. Min A tactile  cues provided to facilitate hip hinge and provide steadying assist. Pt initially very guarded due to  pain but did relax and improve ROM by end of activity.   PATIENT EDUCATION: Education details: Continue HEP  Person educated: Patient Education method: Medical illustrator Education comprehension: verbalized understanding, returned demonstration, and needs further education  HOME EXERCISE PROGRAM: Verbally added seated march overs on eval   Access Code: [email or web address] URL: [email or web address] Date: 11/23/2022 Prepared by: Alethia Berthold Damare Serano  Exercises - Seated Piriformis Stretch with Trunk Bend  - 1 x daily - 7 x weekly - 3 sets - 30-45 second  hold - Supine Figure 4 Piriformis Stretch  - 1 x daily - 7 x weekly - 3 sets - 45-60 second hold - Supine Straight Leg Lumbar Rotation Stretch  - 1 x daily - 7 x weekly - 1 sets - 5-10 reps - 10-20 second hold - Seated Hamstring Stretch  - 1 x daily - 7 x weekly - 3 sets - 10 reps - Supine Butterfly Groin Stretch  - 1 x daily - 7 x weekly - 3 sets - 10 reps - Forward Step Down with Heel Tap and Rail Support  - 1 x daily - 7 x weekly - 3 sets - 10 reps - Side Step Down with Counter Support  - 1 x daily - 7 x weekly - 3 sets - 10 reps - Brandt-Daroff Vestibular Exercise  - 1 x daily - 7 x weekly - 3 sets - 10 reps - ITB Stretch at Wall  - 1 x daily - 7 x weekly - 3 sets - 30-60 second hold - Supine Chin Tuck  - 1 x daily - 7 x weekly - 3 sets - 10 reps - 2-5 second hold - Chin Tuck  - 1 x daily - 7 x weekly - 3 sets - 10 reps - 2-5 second hold  GOALS: Goals reviewed with patient? Yes  SHORT TERM GOALS: Target date: 12/17/2022   Pt will be independent with initial  HEP for improved strength, balance, transfers and gait.  Baseline: Goal status: MET  2.  Pt will improve FGA to 18/30 for decreased fall risk   Baseline: 14/30; 27/30  Goal status: MET  3.  MCTSIB to be assessed and STG/LTG updated  Baseline:  Goal status: DC due to high baseline score   4.  Pt will ambulate greater than or equal to 1625' feet on  with RPE of <7/10 for improved cardiovascular endurance and BLE strength.   Baseline: 1583' w/RPE of 8-10/10, no AD (11/25); 1600' w/RPE of 7-8/10 no AD  Goal status: PARTIALLY MET     LONG TERM GOALS: Target date: 12/31/2022    Pt will be independent with final HEP for improved strength, balance, transfers and gait.  Baseline:  Goal status: IN PROGRESS  2.  Pt will improve FGA to 22/30 for decreased fall risk   Baseline: 14/30; 27/30  Goal status: MET   3.  MCTSIB goal  Baseline:  Goal status: DC due to high baseline score   4.  Pt will ambulate greater than or equal to 1700 feet on with <5/10 RPE for improved cardiovascular endurance and BLE strength.   Baseline: 1583' no AD RPE of 8-10/10 Goal status: IN PROGRESS   NEW SHORT TERM GOALS FOR UPDATED POC:   Target date: 02/01/2023  Pt will demonstrate proper lifting technique of 5# object w/no more than 4/10 pain for improved posterior chain strength and safety  at home  Baseline: Hands braced on thighs, >8/10 pain  Goal status: INITIAL  2.  Pt will ambulate greater than or equal to 1700 feet on with <5/10 RPE for improved cardiovascular endurance and BLE strength.  Baseline: 1583' no AD RPE of 8-10/10 Goal status: IN PROGRESS   NEW LONG TERM GOALS FOR UPDATED POC:  Target date: 03/01/2023  Pt will be independent with final HEP for improved strength, balance, transfers and gait. Baseline:  Goal status: IN PROGRESS  2.  Pt will lift 5# from [facility] shelf w/proper form and no more than 4/10 pain for improved body mechanics and reduced pain levels  Baseline:  Goal status: INITIAL    ASSESSMENT:  CLINICAL IMPRESSION: Emphasis of skilled PT session on improved cervical ROM, posterior chain strength and functional hip strength. Pt reporting low pain levels in neck this date and reduced sensitivity of SCM and upper traps bilaterally. Pt reported significant R hip/low back pain following previous session, so avoided  rotation today. Pt continues to report pain in R hip w/hip flexion, but states her pain no longer radiates to her foot. Continue POC.    OBJECTIVE IMPAIRMENTS: Abnormal gait, decreased activity tolerance, decreased balance, decreased endurance, decreased mobility, difficulty walking, decreased ROM, decreased strength, impaired flexibility, impaired sensation, postural dysfunction, and pain  ACTIVITY LIMITATIONS: lifting, standing, stairs, transfers, bed mobility, bathing, hygiene/grooming, locomotion level, and caring for others  PARTICIPATION LIMITATIONS: cleaning, laundry, shopping, community activity, and yard work  PERSONAL FACTORS: Age, Fitness, Past/current experiences, and 1-2 comorbidities: Lumbar stenosis, 3 brain aneurysms, kidney transplant in 2022  are also affecting patient's functional outcome.   REHAB POTENTIAL: Good  CLINICAL DECISION MAKING: Evolving/moderate complexity  EVALUATION COMPLEXITY: Moderate  PLAN:  PT FREQUENCY: 2x/week  PT DURATION: 6 weeks + 8 weeks (recert)  PLANNED INTERVENTIONS: 78295- PT Re-evaluation, 97110-Therapeutic exercises, 97530- Therapeutic activity, 97112- Neuromuscular re-education, 97535- Self Care, 62130- Manual therapy, [phone]- Gait training, [phone]- Orthotic Fit/training, [phone]- Aquatic Therapy, Patient/Family education, Balance training, Stair training, Dry Needling, Joint mobilization, Vestibular training, and DME instructions  PLAN FOR NEXT SESSION: How is neck? Check BP. Add to HEP for improved spinal and R hip mobility, strength. Global endurance and functional strength (lifting), eccentric control, head turns, anticipatory balance, blaze pods, rebounder, rockerboard, scapular retraction, slam balls, scap push ups, upright rows, chin tucks    Yannis Broce E Jakiah Bienaime, PT, DPT   01/27/2023, 11:56 AM

## 2023-02-01 ENCOUNTER — Ambulatory Visit: Payer: Medicare HMO | Admitting: Physical Therapy

## 2023-02-01 VITALS — BP 146/62 | HR 62

## 2023-02-01 DIAGNOSIS — M5459 Other low back pain: Secondary | ICD-10-CM

## 2023-02-01 DIAGNOSIS — M542 Cervicalgia: Secondary | ICD-10-CM

## 2023-02-01 DIAGNOSIS — R2681 Unsteadiness on feet: Secondary | ICD-10-CM | POA: Diagnosis not present

## 2023-02-01 DIAGNOSIS — M6281 Muscle weakness (generalized): Secondary | ICD-10-CM

## 2023-02-01 DIAGNOSIS — M5431 Sciatica, right side: Secondary | ICD-10-CM | POA: Diagnosis not present

## 2023-02-01 DIAGNOSIS — R2689 Other abnormalities of gait and mobility: Secondary | ICD-10-CM

## 2023-02-01 NOTE — Therapy (Addendum)
OUTPATIENT PHYSICAL THERAPY NEURO TREATMENT   Patient Name: Marae Cottrell MRN: [id number] DOB:[birth year], 73 y.o., female Today's Date: 02/01/2023   PCP: Merlene Laughter, MD REFERRING PROVIDER: Joaquim Nam, MD     END OF SESSION:  PT End of Session - 02/01/23 1104     Visit Number 19    Number of Visits 29   Recert   Date for PT Re-Evaluation 03/01/23   Recert   Authorization Type [employer]    PT Start Time 1103    PT Stop Time 1142    PT Time Calculation (min) 39 min    Activity Tolerance Patient tolerated treatment well    Behavior During Therapy WFL for tasks assessed/performed                Past Medical History:  Diagnosis Date   Abdominal pain 06/06/2018   Acute renal failure (ARF) (HCC) 06/22/2018   Anemia in chronic kidney disease 08/25/2018   Anemia, unspecified 07/05/2018   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 states she has 2 aneurysms   Chronic low back pain 04/12/2017   Chronic pain syndrome 04/12/2017   Closed fracture of left distal radius    De Quervain's tenosynovitis 02/10/2018   Degeneration of lumbar intervertebral disc 03/01/2017   End stage renal failure on dialysis [facility])    M/W/F on [employer]   GERD (gastroesophageal reflux disease)    Goodpasture syndrome (HCC)    Headache 10/30/2014   Hyperlipidemia 04/17/2018   Hypertension    Hypothyroidism    Iron deficiency anemia, unspecified 07/12/2018   PONV (postoperative nausea and vomiting)    violent vomiting   Sacral back pain 05/25/2016   Scoliosis of lumbar spine 03/01/2017   Secondary hyperparathyroidism of renal origin (HCC) 08/22/2018   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   Thyroid disease    Trigger finger of left thumb 02/10/2018   Past Surgical History:  Procedure Laterality Date   ARTERY BIOPSY Right 10/31/2014   Procedure: BIOPSY  TEMPORAL ARTERY-RIGHT;  Surgeon: Pryor Ochoa, MD;  Location: [facility] OR;  Service: Vascular;  Laterality: Right;   AV FISTULA PLACEMENT Left 10/11/2018   Procedure: left arm ARTERIOVENOUS (AV) FISTULA  creation;  Surgeon: Maeola Harman, MD;  Location[facility] OR;  Service: Vascular;  Laterality: Left;   BILATERAL CARPAL TUNNEL RELEASE     BIOPSY  04/29/2019   Procedure: BIOPSY;  Surgeon: Kathi Der, MD;  Location: [facility];  Service: Gastroenterology;;   BIOPSY  11/05/2019   Procedure: BIOPSY;  Surgeon: Kathi Der, MD;  Location: [facility];  Service: Gastroenterology;;   BREAST SURGERY Left    lumpectomy   BUNIONECTOMY Right    CHOLECYSTECTOMY N/A 10/13/2016   Procedure: LAPAROSCOPIC CHOLECYSTECTOMY WITH INTRAOPERATIVE CHOLANGIOGRAM;  Surgeon: Manus Rudd, MD;  Location: [facility] OR;  Service: General;  Laterality: N/A;   COLONOSCOPY WITH PROPOFOL N/A 11/05/2019   Procedure: COLONOSCOPY WITH PROPOFOL;  Surgeon: Kathi Der, MD;  Location: [facility];  Service: Gastroenterology;  Laterality: N/A;   ESOPHAGOGASTRODUODENOSCOPY (EGD) WITH PROPOFOL N/A 04/29/2019   Procedure: ESOPHAGOGASTRODUODENOSCOPY (EGD) WITH PROPOFOL;  Surgeon: Kathi Der, MD;  Location: [facility];  Service: Gastroenterology;  Laterality: N/A;   EYE SURGERY     surgery for cross eye   FOOT FRACTURE SURGERY     OPEN REDUCTION INTERNAL FIXATION (ORIF) DISTAL RADIAL FRACTURE Left  03/29/2017   Procedure: OPEN REDUCTION INTERNAL FIXATION (ORIF)LEFT  DISTAL RADIAL FRACTURE;  Surgeon: Betha Loa, MD;  Location: [facility];  Service: Orthopedics;  Laterality: Left;   POLYPECTOMY  11/05/2019   Procedure: POLYPECTOMY;  Surgeon: Kathi Der, MD;  Location: [facility];  Service: Gastroenterology;;   THYROIDECTOMY     TONSILLECTOMY     WISDOM TOOTH EXTRACTION     Patient Active Problem List   Diagnosis Date Noted   Hypertensive crisis 10/23/2019   History of cerebral  aneurysm 10/23/2019   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   Hypoxemia 02/21/2019   Pulmonary edema 02/20/2019   Encounter for immunization 09/26/2018   Anemia in chronic kidney disease 08/25/2018   End stage renal disease (HCC) 08/25/2018   Secondary hyperparathyroidism of renal origin (HCC) 08/22/2018   Referred otalgia of left ear 08/16/2018   Iron deficiency anemia, unspecified 07/12/2018   Shortness of breath 07/06/2018   Anemia, unspecified 07/05/2018   Anxiety 07/05/2018   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   Acute renal failure (ARF) (HCC) 06/22/2018   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   Abdominal pain 06/06/2018   Hydronephrosis due to obstruction of ureteral orifice 06/06/2018   Disease of thyroid gland 04/17/2018   Hyperlipidemia 04/17/2018   Hypertension 04/17/2018   De Quervain's tenosynovitis 02/10/2018   Trigger finger of left thumb 02/10/2018   Chronic low back pain 04/12/2017   Chronic pain syndrome 04/12/2017   Degeneration of lumbar intervertebral disc 03/01/2017   Scoliosis of lumbar spine 03/01/2017   Sacral back pain 05/25/2016   Eye pain 10/30/2014    ONSET DATE: 11/10/2022 (referral)   REFERRING DIAG:  W09.811 (ICD-10-CM) - Spinal stenosis, lumbar region with neurogenic claudication    THERAPY DIAG:  Muscle weakness (generalized)  Other abnormalities of gait and mobility  Unsteadiness on feet  Other low back pain  Cervicalgia  Rationale for Evaluation and Treatment: Rehabilitation  SUBJECTIVE:                                                                                                                                                                                             SUBJECTIVE STATEMENT: Pt reports feeling okay. Had a bad pain weekend. No falls.   Pt accompanied by: self  PERTINENT HISTORY: hypothyroidism; HTN; HLD; Goodpasture syndrome; cerebral aneurysm; ESRD on TTS HD; and  chronic pain, kidney replacement (12/14/2020).   PAIN:  Are you having pain? Yes: NPRS scale: 4 Pain location: RLE, low back and neck Pain description: achy, throbbing   PRECAUTIONS: Cervical and Fall  RED FLAGS: None   WEIGHT BEARING RESTRICTIONS: No  FALLS: Has patient fallen in last 6 months? Yes. Number of falls %  LIVING ENVIRONMENT: Lives with:  her dog, Snickers  Lives in: House/apartment Stairs: Yes: External: 5 in carport w/bilateral rails and 2 in back w/no rails steps; see previous Has following equipment at home: Single point cane, Walker - 2 wheeled, shower chair, and walking sticks   PLOF: Independent  PATIENT GOALS: "To establish mobility so that I do not fall and I feel confident"   OBJECTIVE:  Note: Objective measures were completed at Evaluation unless otherwise noted.  DIAGNOSTIC FINDINGS: MRI of lumbar spine from 2022   Disc levels:   T12- L1: Unremarkable.   L1-L2: Ventral spondylitic spurring   L2-L3: Disc narrowing and bulging with mild right facet spurring.   L3-L4: Disc narrowing and bulging.  Mild facet spurring   L4-L5: Disc narrowing and bulging with left inferior foraminal protrusion. Asymmetric left facet spurring. Left foraminal stenosis is mild   L5-S1:Disc narrowing and bulging with endplate spurring eccentric to the left. Negative facets.   IMPRESSION: Generalized lumbar spine degeneration similar to 2017. No impingement to explain right leg symptoms.  COGNITION: Overall cognitive status: Within functional limits for tasks assessed   SENSATION: Shooting pain down her RLE that follows sciatic n. Distribution    POSTURE: rounded shoulders and forward head  LOWER EXTREMITY MMT:  Tested in seated position   MMT Right Eval Left Eval  Hip flexion 4- 4  Hip extension    Hip abduction 5 5  Hip adduction 5 5  Hip internal rotation    Hip external rotation    Knee flexion 4+ 5  Knee extension 5 5  Ankle dorsiflexion 4 5   Ankle plantarflexion    Ankle inversion    Ankle eversion    (Blank rows = not tested)   GAIT: Gait pattern:  Decreased eccentric control of LLE, step through pattern, decreased hip/knee flexion- Right, decreased ankle dorsiflexion- Right, lateral hip instability, and lateral lean- Left Distance walked: Various clinic distances  Assistive device utilized: None Level of assistance: SBA Comments: Pt demonstrates improved gait kinematics but continues to be limited by poor eccentric control of LLE w/fatigue and L lateral lean   VITALS Vitals:   02/01/23 1111 02/01/23 1120  BP: (!) 105/54 (!) 146/62  Pulse: (!) 57 62        TODAY'S TREATMENT:      Ther Act Assessed vitals (see above) and diastolic BP on lower end, pt feeling a bit more dizzy than normal but reports she is okay. Continued to monitor throughout session   Ther Ex  SciFit multi-peaks level 12 for 6 minutes using BUE/BLEs for neural priming for reciprocal movement, dynamic cardiovascular warmup and increased amplitude of stepping. RPE of 6-7/10 following activity. Reassessed BP (see above) and WNL  In // bars for improved functional hip strength, step clearance and single leg stability:  Alt step up w/contralateral march to 8" step w/intermittent UE support, x8 reps per side. No instability noted, so progressed to performing on osu (blue side up), x10 reps per side w/BUE support. Increased difficulty on RLE, but no LOB noted.  Lateral step up w/contralateral glute kickback to blue side of Bosu, x15 reps per side w/BUE support. No pain reported w/movement  Stool scoots, x115' fwd and 115' retro, for improved hamstring and quad strength. CGA throughout. Pt drifting to L side due to increased reliance on LLE, so cued to keep stool even by increasing power of RLE. Pt reported  dragging her RLE, but unable to increase step clearance.    PATIENT EDUCATION: Education details: Continue HEP  Person educated: Patient Education  method: Medical illustrator Education comprehension: verbalized understanding, returned demonstration, and needs further education  HOME EXERCISE PROGRAM: Verbally added seated march overs on eval   Access Code: [email or web address] URL: [email or web address] Date: 11/23/2022 Prepared by: Alethia Berthold Scherry Laverne  Exercises - Seated Piriformis Stretch with Trunk Bend  - 1 x daily - 7 x weekly - 3 sets - 30-45 second  hold - Supine Figure 4 Piriformis Stretch  - 1 x daily - 7 x weekly - 3 sets - 45-60 second hold - Supine Straight Leg Lumbar Rotation Stretch  - 1 x daily - 7 x weekly - 1 sets - 5-10 reps - 10-20 second hold - Seated Hamstring Stretch  - 1 x daily - 7 x weekly - 3 sets - 10 reps - Supine Butterfly Groin Stretch  - 1 x daily - 7 x weekly - 3 sets - 10 reps - Forward Step Down with Heel Tap and Rail Support  - 1 x daily - 7 x weekly - 3 sets - 10 reps - Side Step Down with Counter Support  - 1 x daily - 7 x weekly - 3 sets - 10 reps - Brandt-Daroff Vestibular Exercise  - 1 x daily - 7 x weekly - 3 sets - 10 reps - ITB Stretch at Wall  - 1 x daily - 7 x weekly - 3 sets - 30-60 second hold - Supine Chin Tuck  - 1 x daily - 7 x weekly - 3 sets - 10 reps - 2-5 second hold - Chin Tuck  - 1 x daily - 7 x weekly - 3 sets - 10 reps - 2-5 second hold  GOALS: Goals reviewed with patient? Yes  SHORT TERM GOALS: Target date: 12/17/2022   Pt will be independent with initial  HEP for improved strength, balance, transfers and gait.  Baseline: Goal status: MET  2.  Pt will improve FGA to 18/30 for decreased fall risk   Baseline: 14/30; 27/30  Goal status: MET  3.  MCTSIB to be assessed and STG/LTG updated  Baseline:  Goal status: DC due to high baseline score   4.  Pt will ambulate greater than or equal to 1625' feet on with RPE of <7/10 for improved cardiovascular endurance and BLE strength.   Baseline: 1583' w/RPE of 8-10/10, no AD (11/25); 1600' w/RPE of 7-8/10 no  AD  Goal status: PARTIALLY MET     LONG TERM GOALS: Target date: 12/31/2022    Pt will be independent with final HEP for improved strength, balance, transfers and gait.  Baseline:  Goal status: IN PROGRESS  2.  Pt will improve FGA to 22/30 for decreased fall risk   Baseline: 14/30; 27/30  Goal status: MET   3.  MCTSIB goal  Baseline:  Goal status: DC due to high baseline score   4.  Pt will ambulate greater than or equal to 1700 feet on with <5/10 RPE for improved cardiovascular endurance and BLE strength.   Baseline: 1583' no AD RPE of 8-10/10 Goal status: IN PROGRESS   NEW SHORT TERM GOALS FOR UPDATED POC:   Target date: 02/01/2023  Pt will demonstrate proper lifting technique of 5# object w/no more than 4/10 pain for improved posterior chain strength and safety at home  Baseline: Hands braced on thighs, >8/10 pain  Goal status: INITIAL  2.  Pt will ambulate greater than or equal to 1700 feet on with <5/10 RPE for improved cardiovascular endurance and BLE strength.  Baseline: 1583' no AD RPE of 8-10/10 Goal status: IN PROGRESS   NEW LONG TERM GOALS FOR UPDATED POC:  Target date: 03/01/2023  Pt will be independent with final HEP for improved strength, balance, transfers and gait. Baseline:  Goal status: IN PROGRESS  2.  Pt will lift 5# from [facility] shelf w/proper form and no more than 4/10 pain for improved body mechanics and reduced pain levels  Baseline:  Goal status: INITIAL    ASSESSMENT:  CLINICAL IMPRESSION: Emphasis of skilled PT session on single leg stability, functional hip strength and hamstring/quad strength. Pt's BP low at beginning of session but did improve following cardiovascular warmup. Pt continues to be limited by RLE weakness, but has significantly improved since eval. Pt will continue to benefit from skilled PT to progress towards LTGs and reduce pain levels. Continue POC.    OBJECTIVE IMPAIRMENTS: Abnormal gait, decreased activity  tolerance, decreased balance, decreased endurance, decreased mobility, difficulty walking, decreased ROM, decreased strength, impaired flexibility, impaired sensation, postural dysfunction, and pain  ACTIVITY LIMITATIONS: lifting, standing, stairs, transfers, bed mobility, bathing, hygiene/grooming, locomotion level, and caring for others  PARTICIPATION LIMITATIONS: cleaning, laundry, shopping, community activity, and yard work  PERSONAL FACTORS: Age, Fitness, Past/current experiences, and 1-2 comorbidities: Lumbar stenosis, 3 brain aneurysms, kidney transplant in 2022  are also affecting patient's functional outcome.   REHAB POTENTIAL: Good  CLINICAL DECISION MAKING: Evolving/moderate complexity  EVALUATION COMPLEXITY: Moderate  PLAN:  PT FREQUENCY: 2x/week  PT DURATION: 6 weeks + 8 weeks (recert)  PLANNED INTERVENTIONS: 40981- PT Re-evaluation, 97110-Therapeutic exercises, 97530- Therapeutic activity, 97112- Neuromuscular re-education, 97535- Self Care, [id number]- Manual therapy, [id number]- Gait training, [id number]- Orthotic Fit/training, [phone]- Aquatic Therapy, Patient/Family education, Balance training, Stair training, Dry Needling, Joint mobilization, Vestibular training, and DME instructions  PLAN FOR NEXT SESSION: GOALS and 20th visit PN. How is neck? Check BP. Add to HEP for improved spinal and R hip mobility, strength. Global endurance and functional strength (lifting), eccentric control, head turns, anticipatory balance, blaze pods, rebounder, rockerboard, scapular retraction, slam balls, scap push ups, upright rows, chin tucks    Midori Dado E Sabriya Yono, PT, DPT  02/01/2023, 11:45 AM

## 2023-02-02 DIAGNOSIS — H401112 Primary open-angle glaucoma, right eye, moderate stage: Secondary | ICD-10-CM | POA: Diagnosis not present

## 2023-02-02 DIAGNOSIS — H401121 Primary open-angle glaucoma, left eye, mild stage: Secondary | ICD-10-CM | POA: Diagnosis not present

## 2023-02-02 DIAGNOSIS — G510 Bell's palsy: Secondary | ICD-10-CM | POA: Diagnosis not present

## 2023-02-02 DIAGNOSIS — H16223 Keratoconjunctivitis sicca, not specified as Sjogren's, bilateral: Secondary | ICD-10-CM | POA: Diagnosis not present

## 2023-02-02 DIAGNOSIS — H35371 Puckering of macula, right eye: Secondary | ICD-10-CM | POA: Diagnosis not present

## 2023-02-02 DIAGNOSIS — Z961 Presence of intraocular lens: Secondary | ICD-10-CM | POA: Diagnosis not present

## 2023-02-02 DIAGNOSIS — H43813 Vitreous degeneration, bilateral: Secondary | ICD-10-CM | POA: Diagnosis not present

## 2023-02-02 DIAGNOSIS — D3132 Benign neoplasm of left choroid: Secondary | ICD-10-CM | POA: Diagnosis not present

## 2023-02-03 ENCOUNTER — Ambulatory Visit: Payer: Medicare HMO | Admitting: Physical Therapy

## 2023-02-03 VITALS — BP 123/84 | HR 57

## 2023-02-03 DIAGNOSIS — M6281 Muscle weakness (generalized): Secondary | ICD-10-CM

## 2023-02-03 DIAGNOSIS — R2689 Other abnormalities of gait and mobility: Secondary | ICD-10-CM | POA: Diagnosis not present

## 2023-02-03 DIAGNOSIS — M5431 Sciatica, right side: Secondary | ICD-10-CM | POA: Diagnosis not present

## 2023-02-03 DIAGNOSIS — M5459 Other low back pain: Secondary | ICD-10-CM | POA: Diagnosis not present

## 2023-02-03 DIAGNOSIS — R2681 Unsteadiness on feet: Secondary | ICD-10-CM | POA: Diagnosis not present

## 2023-02-03 DIAGNOSIS — M542 Cervicalgia: Secondary | ICD-10-CM | POA: Diagnosis not present

## 2023-02-03 NOTE — Therapy (Signed)
OUTPATIENT PHYSICAL THERAPY NEURO TREATMENT- 20TH VISIT PROGRESS NOTE   Patient Name: Cristina Wilson MRN: [id number] DOB:1950/08/06, 73 y.o., female Today's Date: 02/03/2023   PCP: Merlene Laughter, MD REFERRING PROVIDER: Joaquim Nam, MD  Physical Therapy Progress Note   Dates of Reporting Period:11/19/22 - 02/03/23  See Note below for Objective Data and Assessment of Progress/Goals.     END OF SESSION:  PT End of Session - 02/03/23 1102     Visit Number 20    Number of Visits 29   Recert   Date for PT Re-Evaluation 03/01/23   Recert   Authorization Type [employer]    PT Start Time 1101    PT Stop Time 1142    PT Time Calculation (min) 41 min    Equipment Utilized During Treatment Gait belt    Activity Tolerance Patient tolerated treatment well    Behavior During Therapy WFL for tasks assessed/performed                Past Medical History:  Diagnosis Date   Abdominal pain 06/06/2018   Acute renal failure (ARF) (HCC) 06/22/2018   Anemia in chronic kidney disease 08/25/2018   Anemia, unspecified 07/05/2018   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 states she has 2 aneurysms   Chronic low back pain 04/12/2017   Chronic pain syndrome 04/12/2017   Closed fracture of left distal radius    De Quervain's tenosynovitis 02/10/2018   Degeneration of lumbar intervertebral disc 03/01/2017   End stage renal failure on dialysis [facility])    M/W/F on [employer]   GERD (gastroesophageal reflux disease)    Goodpasture syndrome (HCC)    Headache 10/30/2014   Hyperlipidemia 04/17/2018   Hypertension    Hypothyroidism    Iron deficiency anemia, unspecified 07/12/2018   PONV (postoperative nausea and vomiting)    violent vomiting   Sacral back pain 05/25/2016   Scoliosis of lumbar spine 03/01/2017   Secondary hyperparathyroidism of renal origin (HCC)  08/22/2018   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   Thyroid disease    Trigger finger of left thumb 02/10/2018   Past Surgical History:  Procedure Laterality Date   ARTERY BIOPSY Right 10/31/2014   Procedure: BIOPSY TEMPORAL ARTERY-RIGHT;  Surgeon: Pryor Ochoa, MD;  Location: [facility] OR;  Service: Vascular;  Laterality: Right;   AV FISTULA PLACEMENT Left 10/11/2018   Procedure: left arm ARTERIOVENOUS (AV) FISTULA  creation;  Surgeon: Maeola Harman, MD;  Location: [facility] OR;  Service: Vascular;  Laterality: Left;   BILATERAL CARPAL TUNNEL RELEASE     BIOPSY  04/29/2019   Procedure: BIOPSY;  Surgeon: Kathi Der, MD;  Location: [facility] ENDOSCOPY;  Service: Gastroenterology;;   BIOPSY  11/05/2019   Procedure: BIOPSY;  Surgeon: Kathi Der, MD;  Location: [facility] ENDOSCOPY;  Service: Gastroenterology;;   BREAST SURGERY Left    lumpectomy   BUNIONECTOMY Right    CHOLECYSTECTOMY N/A 10/13/2016   Procedure: LAPAROSCOPIC CHOLECYSTECTOMY WITH INTRAOPERATIVE CHOLANGIOGRAM;  Surgeon: Manus Rudd, MD;  Location: [facility] OR;  Service: General;  Laterality: N/A;   COLONOSCOPY WITH PROPOFOL N/A 11/05/2019   Procedure: COLONOSCOPY WITH PROPOFOL;  Surgeon: Kathi Der, MD;  Location: [facility] ENDOSCOPY;  Service: Gastroenterology;  Laterality: N/A;   ESOPHAGOGASTRODUODENOSCOPY (EGD) WITH PROPOFOL N/A 04/29/2019   Procedure: ESOPHAGOGASTRODUODENOSCOPY (EGD) WITH PROPOFOL;  Surgeon: Kathi Der, MD;  Location: [facility]  ENDOSCOPY;  Service: Gastroenterology;  Laterality: N/A;   EYE SURGERY     surgery for cross eye   FOOT FRACTURE SURGERY     OPEN REDUCTION INTERNAL FIXATION (ORIF) DISTAL RADIAL FRACTURE Left 03/29/2017   Procedure: OPEN REDUCTION INTERNAL FIXATION (ORIF)LEFT  DISTAL RADIAL FRACTURE;  Surgeon: Betha Loa, MD;  Location: [facility];  Service: Orthopedics;  Laterality: Left;   POLYPECTOMY  11/05/2019   Procedure: POLYPECTOMY;  Surgeon: Kathi Der, MD;  Location:  [facility] ENDOSCOPY;  Service: Gastroenterology;;   THYROIDECTOMY     TONSILLECTOMY     WISDOM TOOTH EXTRACTION     Patient Active Problem List   Diagnosis Date Noted   Hypertensive crisis 10/23/2019   History of cerebral aneurysm 10/23/2019   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   Hypoxemia 02/21/2019   Pulmonary edema 02/20/2019   Encounter for immunization 09/26/2018   Anemia in chronic kidney disease 08/25/2018   End stage renal disease (HCC) 08/25/2018   Secondary hyperparathyroidism of renal origin (HCC) 08/22/2018   Referred otalgia of left ear 08/16/2018   Iron deficiency anemia, unspecified 07/12/2018   Shortness of breath 07/06/2018   Anemia, unspecified 07/05/2018   Anxiety 07/05/2018   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   Acute renal failure (ARF) (HCC) 06/22/2018   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   Abdominal pain 06/06/2018   Hydronephrosis due to obstruction of ureteral orifice 06/06/2018   Disease of thyroid gland 04/17/2018   Hyperlipidemia 04/17/2018   Hypertension 04/17/2018   De Quervain's tenosynovitis 02/10/2018   Trigger finger of left thumb 02/10/2018   Chronic low back pain 04/12/2017   Chronic pain syndrome 04/12/2017   Degeneration of lumbar intervertebral disc 03/01/2017   Scoliosis of lumbar spine 03/01/2017   Sacral back pain 05/25/2016   Eye pain 10/30/2014    ONSET DATE: 11/10/2022 (referral)   REFERRING DIAG:  Z61.096 (ICD-10-CM) - Spinal stenosis, lumbar region with neurogenic claudication    THERAPY DIAG:  Muscle weakness (generalized)  Other abnormalities of gait and mobility  Unsteadiness on feet  Other low back pain  Rationale for Evaluation and Treatment: Rehabilitation  SUBJECTIVE:                                                                                                                                                                                             SUBJECTIVE  STATEMENT: Pt reports doing well today. Rating pain as a 4/10. Felt sore after last session but no significant pain.   Pt accompanied by: self  PERTINENT HISTORY: hypothyroidism; HTN; HLD; Goodpasture syndrome; cerebral aneurysm; ESRD on TTS HD; and chronic  pain, kidney replacement (12/14/2020).   PAIN:  Are you having pain? Yes: NPRS scale: 4 Pain location: RLE, low back and neck Pain description: achy, throbbing   PRECAUTIONS: Cervical and Fall  RED FLAGS: None   WEIGHT BEARING RESTRICTIONS: No  FALLS: Has patient fallen in last 6 months? Yes. Number of falls %  LIVING ENVIRONMENT: Lives with:  her dog, Snickers  Lives in: House/apartment Stairs: Yes: External: 5 in carport w/bilateral rails and 2 in back w/no rails steps; see previous Has following equipment at home: Single point cane, Walker - 2 wheeled, shower chair, and walking sticks   PLOF: Independent  PATIENT GOALS: "To establish mobility so that I do not fall and I feel confident"   OBJECTIVE:  Note: Objective measures were completed at Evaluation unless otherwise noted.  DIAGNOSTIC FINDINGS: MRI of lumbar spine from 2022   Disc levels:   T12- L1: Unremarkable.   L1-L2: Ventral spondylitic spurring   L2-L3: Disc narrowing and bulging with mild right facet spurring.   L3-L4: Disc narrowing and bulging.  Mild facet spurring   L4-L5: Disc narrowing and bulging with left inferior foraminal protrusion. Asymmetric left facet spurring. Left foraminal stenosis is mild   L5-S1:Disc narrowing and bulging with endplate spurring eccentric to the left. Negative facets.   IMPRESSION: Generalized lumbar spine degeneration similar to 2017. No impingement to explain right leg symptoms.  COGNITION: Overall cognitive status: Within functional limits for tasks assessed   SENSATION: Shooting pain down her RLE that follows sciatic n. Distribution    POSTURE: rounded shoulders and forward head  LOWER EXTREMITY  MMT:  Tested in seated position   MMT Right Eval Left Eval  Hip flexion 4- 4  Hip extension    Hip abduction 5 5  Hip adduction 5 5  Hip internal rotation    Hip external rotation    Knee flexion 4+ 5  Knee extension 5 5  Ankle dorsiflexion 4 5  Ankle plantarflexion    Ankle inversion    Ankle eversion    (Blank rows = not tested)   GAIT: Gait pattern:  Decreased eccentric control of LLE, step through pattern, decreased hip/knee flexion- Right, decreased ankle dorsiflexion- Right, lateral hip instability, and lateral lean- Left Distance walked: Various clinic distances  Assistive device utilized: None Level of assistance: SBA Comments: Pt demonstrates improved gait kinematics but continues to be limited by poor eccentric control of LLE w/fatigue and L lateral lean   VITALS Vitals:   02/03/23 1107  BP: 123/84  Pulse: (!) 57       TODAY'S TREATMENT:      Ther Act Assessed vitals (see above) and WNL   Gait pattern: step through pattern, decreased hip/knee flexion- Right, lateral hip instability, lateral lean- Left, decreased trunk rotation, and wide BOS Distance walked: 1596' outside on sidewalk  Assistive device utilized: None Level of assistance: Modified independence Comments: Pt requested to perform test outside, so performed on sidewalk around building. No LOB noted but pt continues to have decreased eccentric control on LLE and compensated trendelenburg on RLE. RPE of 5/10 following test   Pt performed 10 deadlifts to ground w/5# KB w/proper form for improved posterior chain strength and proper lifting technique. No pain reported w/activity but pt did report burning along sciatic nerve on R side.   Ther Ex  On mat table, long sitting march overs using 5# KB lying horizontally, 1x5 and 1x10 reps per side for improved hip flexor and functional core strength.  Pt very challenged by this, especially on R side. Verbally added to HEP (see below)   Supine psoas  marches w/yellow theraband around feet, x3 reps per side, for improved hip flexor strength and core stability. Pt unable to perform w/resistance so regressed to bicycles w/legs only, x6 reps per side. Pt required rest break in between reps due to fatigue and low back discomfort.  Reviewed proper log roll technique to R side, as pt reports difficulty getting out of bed and would like to prepare for cervical spine surgery. Pt relies heavily on bracing legs on edge of mat to pull up due to BUE weakness and core instability. Pt did report reduced strain on neck and back w/log roll technique and encouraged pt to ensure her bed is flat before attempting (pt typically has head and feet elevated).     PATIENT EDUCATION: Education details: Updates to HEP, goal results  Person educated: Patient Education method: Medical illustrator Education comprehension: verbalized understanding, returned demonstration, and needs further education  HOME EXERCISE PROGRAM: Verbally added seated march overs on eval   Access Code: [email or web address] URL: [email or web address] Date: 11/23/2022 Prepared by: Alethia Berthold Tyshana Nishida  Exercises - Seated Piriformis Stretch with Trunk Bend  - 1 x daily - 7 x weekly - 3 sets - 30-45 second  hold - Supine Figure 4 Piriformis Stretch  - 1 x daily - 7 x weekly - 3 sets - 45-60 second hold - Supine Straight Leg Lumbar Rotation Stretch  - 1 x daily - 7 x weekly - 1 sets - 5-10 reps - 10-20 second hold - Seated Hamstring Stretch  - 1 x daily - 7 x weekly - 3 sets - 10 reps - Supine Butterfly Groin Stretch  - 1 x daily - 7 x weekly - 3 sets - 10 reps - Forward Step Down with Heel Tap and Rail Support  - 1 x daily - 7 x weekly - 3 sets - 10 reps - Side Step Down with Counter Support  - 1 x daily - 7 x weekly - 3 sets - 10 reps - Brandt-Daroff Vestibular Exercise  - 1 x daily - 7 x weekly - 3 sets - 10 reps - ITB Stretch at Wall  - 1 x daily - 7 x weekly - 3 sets - 30-60 second  hold - Supine Chin Tuck  - 1 x daily - 7 x weekly - 3 sets - 10 reps - 2-5 second hold - Chin Tuck  - 1 x daily - 7 x weekly - 3 sets - 10 reps - 2-5 second hold  -Verbally added march overs in long-sit on 02/03/23   GOALS: Goals reviewed with patient? Yes    NEW SHORT TERM GOALS FOR UPDATED POC:   Target date: 02/01/2023  Pt will demonstrate proper lifting technique of 5# object w/no more than 4/10 pain for improved posterior chain strength and safety at home  Baseline: Hands braced on thighs, >8/10 pain  Goal status: MET  2.  Pt will ambulate greater than or equal to 1700 feet on with <5/10 RPE for improved cardiovascular endurance and BLE strength.  Baseline: 1583' no AD RPE of 8-10/10; 1596' outside no AD, RPE of 5/10  Goal status: NOT MET   NEW LONG TERM GOALS FOR UPDATED POC:  Target date: 03/01/2023  Pt will be independent with final HEP for improved strength, balance, transfers and gait. Baseline:  Goal status: IN PROGRESS  2.  Pt will lift 5# from [facility]  shelf w/proper form and no more than 4/10 pain for improved body mechanics and reduced pain levels  Baseline:  Goal status: INITIAL    ASSESSMENT:  CLINICAL IMPRESSION: Emphasis of skilled PT session on STG assessment, proper bed mobility and functional core/hip flexor strength. Pt has met 1 of 2 STGs, demonstrating proper lifting technique of 5# object w/no pain reported. Pt did improve her distance on and performed on unlevel outdoor surface, but did not meet her goal distance. However, pt reported 5/10 RPE on more difficult surface and was able to maintain pace well. Pt continues to be limited by decreased functional core strength and high pain levels, but overall reports improved confidence in mobility and has not had any stumbles or falls since starting PT.  Continue POC.    OBJECTIVE IMPAIRMENTS: Abnormal gait, decreased activity tolerance, decreased balance, decreased endurance, decreased mobility,  difficulty walking, decreased ROM, decreased strength, impaired flexibility, impaired sensation, postural dysfunction, and pain  ACTIVITY LIMITATIONS: lifting, standing, stairs, transfers, bed mobility, bathing, hygiene/grooming, locomotion level, and caring for others  PARTICIPATION LIMITATIONS: cleaning, laundry, shopping, community activity, and yard work  PERSONAL FACTORS: Age, Fitness, Past/current experiences, and 1-2 comorbidities: Lumbar stenosis, 3 brain aneurysms, kidney transplant in 2022  are also affecting patient's functional outcome.   REHAB POTENTIAL: Good  CLINICAL DECISION MAKING: Evolving/moderate complexity  EVALUATION COMPLEXITY: Moderate  PLAN:  PT FREQUENCY: 2x/week  PT DURATION: 6 weeks + 8 weeks (recert)  PLANNED INTERVENTIONS: 16109- PT Re-evaluation, 97110-Therapeutic exercises, 97530- Therapeutic activity, 97112- Neuromuscular re-education, 97535- Self Care, 60454- Manual therapy, [phone]- Gait training, [phone]- Orthotic Fit/training, [phone]- Aquatic Therapy, Patient/Family education, Balance training, Stair training, Dry Needling, Joint mobilization, Vestibular training, and DME instructions  PLAN FOR NEXT SESSION: Core stability. How is neck? Check BP. Add to HEP for improved spinal and R hip mobility, strength. Global endurance and functional strength (lifting), eccentric control, head turns, anticipatory balance, blaze pods, rebounder, rockerboard, scapular retraction, slam balls, scap push ups, upright rows, chin tucks    Elson Ulbrich E Tynleigh Birt, PT, DPT  02/03/2023, 11:47 AM

## 2023-02-07 ENCOUNTER — Ambulatory Visit: Payer: Medicare HMO | Attending: Vascular Surgery

## 2023-02-07 DIAGNOSIS — M542 Cervicalgia: Secondary | ICD-10-CM | POA: Diagnosis not present

## 2023-02-07 DIAGNOSIS — R296 Repeated falls: Secondary | ICD-10-CM | POA: Diagnosis not present

## 2023-02-07 DIAGNOSIS — M6281 Muscle weakness (generalized): Secondary | ICD-10-CM | POA: Diagnosis not present

## 2023-02-07 DIAGNOSIS — R2681 Unsteadiness on feet: Secondary | ICD-10-CM | POA: Diagnosis not present

## 2023-02-07 DIAGNOSIS — Z5181 Encounter for therapeutic drug level monitoring: Secondary | ICD-10-CM | POA: Diagnosis not present

## 2023-02-07 DIAGNOSIS — R2689 Other abnormalities of gait and mobility: Secondary | ICD-10-CM | POA: Diagnosis not present

## 2023-02-07 DIAGNOSIS — M5459 Other low back pain: Secondary | ICD-10-CM | POA: Insufficient documentation

## 2023-02-07 DIAGNOSIS — M5431 Sciatica, right side: Secondary | ICD-10-CM | POA: Insufficient documentation

## 2023-02-07 NOTE — Therapy (Signed)
OUTPATIENT PHYSICAL THERAPY NEURO TREATMENT- 20TH VISIT PROGRESS NOTE   Patient Name: Rayya Yagi MRN: [id number] DOB:15-Apr-1950, 73 y.o., female Today's Date: 02/07/2023   PCP: Merlene Laughter, MD REFERRING PROVIDER: Joaquim Nam, MD  Physical Therapy Progress Note   Dates of Reporting Period:11/19/22 - 02/03/23  See Note below for Objective Data and Assessment of Progress/Goals.     END OF SESSION:  PT End of Session - 02/07/23 1326     Visit Number 21    Number of Visits 29   Recert   Date for PT Re-Evaluation 03/01/23   Recert   Authorization Type [employer]    PT Start Time 1330    PT Stop Time 1415    PT Time Calculation (min) 45 min    Equipment Utilized During Treatment Gait belt    Activity Tolerance Patient tolerated treatment well    Behavior During Therapy WFL for tasks assessed/performed                Past Medical History:  Diagnosis Date   Abdominal pain 06/06/2018   Acute renal failure (ARF) (HCC) 06/22/2018   Anemia in chronic kidney disease 08/25/2018   Anemia, unspecified 07/05/2018   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 states she has 2 aneurysms   Chronic low back pain 04/12/2017   Chronic pain syndrome 04/12/2017   Closed fracture of left distal radius    De Quervain's tenosynovitis 02/10/2018   Degeneration of lumbar intervertebral disc 03/01/2017   End stage renal failure on dialysis [facility])    M/W/F on [employer]   GERD (gastroesophageal reflux disease)    Goodpasture syndrome (HCC)    Headache 10/30/2014   Hyperlipidemia 04/17/2018   Hypertension    Hypothyroidism    Iron deficiency anemia, unspecified 07/12/2018   PONV (postoperative nausea and vomiting)    violent vomiting   Sacral back pain 05/25/2016   Scoliosis of lumbar spine 03/01/2017   Secondary hyperparathyroidism of renal origin (HCC)  08/22/2018   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 OR;  Service: Vascular;  Laterality: Left;   BILATERAL CARPAL TUNNEL RELEASE     BIOPSY  04/29/2019   Procedure: BIOPSY;  Surgeon: Kathi Der, MD;  Location: [facility] ENDOSCOPY;  Service: Gastroenterology;;   BIOPSY  11/05/2019   Procedure: BIOPSY;  Surgeon: Kathi Der, MD;  Location: [facility] ENDOSCOPY;  Service: Gastroenterology;;   BREAST SURGERY Left    lumpectomy   BUNIONECTOMY Right    CHOLECYSTECTOMY N/A 10/13/2016   Procedure: LAPAROSCOPIC CHOLECYSTECTOMY WITH INTRAOPERATIVE CHOLANGIOGRAM;  Surgeon: Manus Rudd, MD;  Location: [facility] OR;  Service: General;  Laterality: N/A;   COLONOSCOPY WITH PROPOFOL N/A 11/05/2019   Procedure: COLONOSCOPY WITH PROPOFOL;  Surgeon: Kathi Der, MD;  Location: [facility] ENDOSCOPY;  Service: Gastroenterology;  Laterality: N/A;   ESOPHAGOGASTRODUODENOSCOPY (EGD) WITH PROPOFOL N/A 04/29/2019   Procedure: ESOPHAGOGASTRODUODENOSCOPY (EGD) WITH PROPOFOL;  Surgeon: Kathi Der, MD;  Location: [facility]  ENDOSCOPY;  Service: Gastroenterology;  Laterality: N/A;   EYE SURGERY     surgery for cross eye   FOOT FRACTURE SURGERY     OPEN REDUCTION INTERNAL FIXATION (ORIF) DISTAL RADIAL FRACTURE Left 03/29/2017   Procedure: OPEN REDUCTION INTERNAL FIXATION (ORIF)LEFT  DISTAL RADIAL FRACTURE;  Surgeon: Betha Loa, MD;  Location:  SURGERY CENTER;  Service: Orthopedics;  Laterality: Left;   POLYPECTOMY  11/05/2019   Procedure: POLYPECTOMY;  Surgeon: Kathi Der, MD;  Location:  [facility] ENDOSCOPY;  Service: Gastroenterology;;   THYROIDECTOMY     TONSILLECTOMY     WISDOM TOOTH EXTRACTION     Patient Active Problem List   Diagnosis Date Noted   Hypertensive crisis 10/23/2019   History of cerebral aneurysm 10/23/2019   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   Hypoxemia 02/21/2019   Pulmonary edema 02/20/2019   Encounter for immunization 09/26/2018   Anemia in chronic kidney disease 08/25/2018   End stage renal disease (HCC) 08/25/2018   Secondary hyperparathyroidism of renal origin (HCC) 08/22/2018   Referred otalgia of left ear 08/16/2018   Iron deficiency anemia, unspecified 07/12/2018   Shortness of breath 07/06/2018   Anemia, unspecified 07/05/2018   Anxiety 07/05/2018   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   Acute renal failure (ARF) (HCC) 06/22/2018   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   Abdominal pain 06/06/2018   Hydronephrosis due to obstruction of ureteral orifice 06/06/2018   Disease of thyroid gland 04/17/2018   Hyperlipidemia 04/17/2018   Hypertension 04/17/2018   De Quervain's tenosynovitis 02/10/2018   Trigger finger of left thumb 02/10/2018   Chronic low back pain 04/12/2017   Chronic pain syndrome 04/12/2017   Degeneration of lumbar intervertebral disc 03/01/2017   Scoliosis of lumbar spine 03/01/2017   Sacral back pain 05/25/2016   Eye pain 10/30/2014    ONSET DATE: 11/10/2022 (referral)   REFERRING DIAG:  U98.119 (ICD-10-CM) - Spinal stenosis, lumbar region with neurogenic claudication    THERAPY DIAG:  Muscle weakness (generalized)  Other abnormalities of gait and mobility  Unsteadiness on feet  Other low back pain  Cervicalgia  Sciatica, right side  Repeated falls  Rationale for Evaluation and Treatment: Rehabilitation  SUBJECTIVE:                                                                                                                                                                                              SUBJECTIVE STATEMENT: Pt reports symptoms severeity in positin of sitting>standing>walking>lying only down to the R LE  Pt accompanied by: self  PERTINENT HISTORY: hypothyroidism; HTN; HLD; Goodpasture syndrome; cerebral aneurysm; ESRD on TTS  HD; and chronic pain, kidney replacement (12/14/2020).   PAIN:  Are you having pain? Yes: NPRS scale: 4 Pain location: RLE, low back and neck Pain description: achy, throbbing   PRECAUTIONS: Cervical and Fall  RED FLAGS: None   WEIGHT BEARING RESTRICTIONS: No  FALLS: Has patient fallen in last 6 months? Yes. Number of falls %  LIVING ENVIRONMENT: Lives with:  her dog, Snickers  Lives in: House/apartment Stairs: Yes: External: 5 in carport w/bilateral rails and 2 in back w/no rails steps; see previous Has following equipment at home: Single point cane, Walker - 2 wheeled, shower chair, and walking sticks   PLOF: Independent  PATIENT GOALS: "To establish mobility so that I do not fall and I feel confident"   OBJECTIVE:  Note: Objective measures were completed at Evaluation unless otherwise noted.  DIAGNOSTIC FINDINGS: MRI of lumbar spine from 2022   Disc levels:   T12- L1: Unremarkable.   L1-L2: Ventral spondylitic spurring   L2-L3: Disc narrowing and bulging with mild right facet spurring.   L3-L4: Disc narrowing and bulging.  Mild facet spurring   L4-L5: Disc narrowing and bulging with left inferior foraminal protrusion. Asymmetric left facet spurring. Left foraminal stenosis is mild   L5-S1:Disc narrowing and bulging with endplate spurring eccentric to the left. Negative facets.   IMPRESSION: Generalized lumbar spine degeneration similar to 2017. No impingement to explain right leg symptoms.  COGNITION: Overall cognitive status: Within functional limits for tasks assessed   SENSATION: Shooting pain down her RLE that follows sciatic n. Distribution    POSTURE: rounded  shoulders and forward head  LOWER EXTREMITY MMT:  Tested in seated position   MMT Right Eval Left Eval  Hip flexion 4- 4  Hip extension    Hip abduction 5 5  Hip adduction 5 5  Hip internal rotation    Hip external rotation    Knee flexion 4+ 5  Knee extension 5 5  Ankle dorsiflexion 4 5  Ankle plantarflexion    Ankle inversion    Ankle eversion    (Blank rows = not tested)   GAIT: Gait pattern:  Decreased eccentric control of LLE, step through pattern, decreased hip/knee flexion- Right, decreased ankle dorsiflexion- Right, lateral hip instability, and lateral lean- Left Distance walked: Various clinic distances  Assistive device utilized: None Level of assistance: SBA Comments: Pt demonstrates improved gait kinematics but continues to be limited by poor eccentric control of LLE w/fatigue and L lateral lean   VITALS There were no vitals filed for this visit.      TODAY'S TREATMENT:       SLR tested: pt reporting radicular symptoms at 45 deg of passive SLR in R LE with symptoms in her foot. Negative SLR on L LE with 80 deg   Tender over R ASIS>L ASIS  Self SL joint stabilization: Pt supine in hooklying: Pt isomerically presses R heel into bed (engaging R gluts) while simultaneously pushes left knee into her hands and isomerically holds for 10 sec: repeated 10x, 5x - SLR improved to 70 deg before symptoms started into her L LE  Supine hooklying isometric hip abduction: 10 x 10" holds  Supine hooklying double leg lift:  3 x 10   Extensive education provided on maintaining neutral spine while sitting, provided verbal and tactile cues. Pt educated on changing positions frequently to manage pain and to avoid symptoms from getting worst throughtout the day. We discussed options of leaning on her elbow while doing dishes as standing  in one place causes worsening of her symptoms. Patient educated on centralization of symptoms and managing pain with above activities/exercises  to keep pain/symptoms centralized.    PATIENT EDUCATION: Education details: Updates to HEP, goal results  Person educated: Patient Education method: Medical illustrator Education comprehension: verbalized understanding, returned demonstration, and needs further education  HOME EXERCISE PROGRAM: Verbally added seated march overs on eval   Access Code: [email or web address] URL: [email or web address] Date: 11/23/2022 Prepared by: Alethia Berthold Plaster  Exercises - Seated Piriformis Stretch with Trunk Bend  - 1 x daily - 7 x weekly - 3 sets - 30-45 second  hold - Supine Figure 4 Piriformis Stretch  - 1 x daily - 7 x weekly - 3 sets - 45-60 second hold - Supine Straight Leg Lumbar Rotation Stretch  - 1 x daily - 7 x weekly - 1 sets - 5-10 reps - 10-20 second hold - Seated Hamstring Stretch  - 1 x daily - 7 x weekly - 3 sets - 10 reps - Supine Butterfly Groin Stretch  - 1 x daily - 7 x weekly - 3 sets - 10 reps - Forward Step Down with Heel Tap and Rail Support  - 1 x daily - 7 x weekly - 3 sets - 10 reps - Side Step Down with Counter Support  - 1 x daily - 7 x weekly - 3 sets - 10 reps - Brandt-Daroff Vestibular Exercise  - 1 x daily - 7 x weekly - 3 sets - 10 reps - ITB Stretch at Wall  - 1 x daily - 7 x weekly - 3 sets - 30-60 second hold - Supine Chin Tuck  - 1 x daily - 7 x weekly - 3 sets - 10 reps - 2-5 second hold - Chin Tuck  - 1 x daily - 7 x weekly - 3 sets - 10 reps - 2-5 second hold  -Verbally added march overs in long-sit on 02/03/23   Access Code: [email or web address] URL: [email or web address] Date: 02/07/2023 Prepared by: Lavone Nian  Exercises - Hooklying Sacroiliac Joint Isometric  - 1 x daily - 7 x weekly - 10 reps - 10 sec hold - Bilateral Bent Leg Lift  - 1 x daily - 7 x weekly - 3 sets - 10 reps  GOALS: Goals reviewed with patient? Yes    NEW SHORT TERM GOALS FOR UPDATED POC:   Target date: 02/01/2023  Pt will demonstrate proper lifting  technique of 5# object w/no more than 4/10 pain for improved posterior chain strength and safety at home  Baseline: Hands braced on thighs, >8/10 pain  Goal status: MET  2.  Pt will ambulate greater than or equal to 1700 feet on with <5/10 RPE for improved cardiovascular endurance and BLE strength.  Baseline: 1583' no AD RPE of 8-10/10; 1596' outside no AD, RPE of 5/10  Goal status: NOT MET   NEW LONG TERM GOALS FOR UPDATED POC:  Target date: 03/01/2023  Pt will be independent with final HEP for improved strength, balance, transfers and gait. Baseline:  Goal status: IN PROGRESS  2.  Pt will lift 5# from [facility] shelf w/proper form and no more than 4/10 pain for improved body mechanics and reduced pain levels  Baseline:  Goal status: INITIAL    ASSESSMENT:  CLINICAL IMPRESSION: Pt demonstrated improved neural tension in R LE with self SI joint/lumbar mobilization. Pt given HEP to continue with self mobilization and progressed core strengthening as tolerated per patient.    OBJECTIVE  IMPAIRMENTS: Abnormal gait, decreased activity tolerance, decreased balance, decreased endurance, decreased mobility, difficulty walking, decreased ROM, decreased strength, impaired flexibility, impaired sensation, postural dysfunction, and pain  ACTIVITY LIMITATIONS: lifting, standing, stairs, transfers, bed mobility, bathing, hygiene/grooming, locomotion level, and caring for others  PARTICIPATION LIMITATIONS: cleaning, laundry, shopping, community activity, and yard work  PERSONAL FACTORS: Age, Fitness, Past/current experiences, and 1-2 comorbidities: Lumbar stenosis, 3 brain aneurysms, kidney transplant in 2022  are also affecting patient's functional outcome.   REHAB POTENTIAL: Good  CLINICAL DECISION MAKING: Evolving/moderate complexity  EVALUATION COMPLEXITY: Moderate  PLAN:  PT FREQUENCY: 2x/week  PT DURATION: 6 weeks + 8 weeks (recert)  PLANNED INTERVENTIONS: 16109- PT Re-evaluation,  97110-Therapeutic exercises, 97530- Therapeutic activity, 97112- Neuromuscular re-education, 97535- Self Care, 60454- Manual therapy, [phone]- Gait training, [phone]- Orthotic Fit/training, [phone]- Aquatic Therapy, Patient/Family education, Balance training, Stair training, Dry Needling, Joint mobilization, Vestibular training, and DME instructions  PLAN FOR NEXT SESSION: Core stability. How is neck? Check BP. Add to HEP for improved spinal and R hip mobility, strength. Global endurance and functional strength (lifting), eccentric control, head turns, anticipatory balance, blaze pods, rebounder, rockerboard, scapular retraction, slam balls, scap push ups, upright rows, chin tucks    Ileana Ladd, PT, DPT  02/07/2023, 2:18 PM

## 2023-02-09 ENCOUNTER — Ambulatory Visit: Payer: Medicare HMO

## 2023-02-09 VITALS — BP 133/75 | HR 64

## 2023-02-09 DIAGNOSIS — R2689 Other abnormalities of gait and mobility: Secondary | ICD-10-CM

## 2023-02-09 DIAGNOSIS — R296 Repeated falls: Secondary | ICD-10-CM | POA: Diagnosis not present

## 2023-02-09 DIAGNOSIS — M5459 Other low back pain: Secondary | ICD-10-CM | POA: Diagnosis not present

## 2023-02-09 DIAGNOSIS — M542 Cervicalgia: Secondary | ICD-10-CM | POA: Diagnosis not present

## 2023-02-09 DIAGNOSIS — R2681 Unsteadiness on feet: Secondary | ICD-10-CM | POA: Diagnosis not present

## 2023-02-09 DIAGNOSIS — M5431 Sciatica, right side: Secondary | ICD-10-CM | POA: Diagnosis not present

## 2023-02-09 DIAGNOSIS — M6281 Muscle weakness (generalized): Secondary | ICD-10-CM

## 2023-02-09 NOTE — Therapy (Signed)
 OUTPATIENT PHYSICAL THERAPY NEURO TREATMENT   Patient Name: Arshiya Jakes MRN: [id number] DOB:28-Jun-1950, 73 y.o., female Today's Date: 02/09/2023   PCP: Roseann Coad, MD REFERRING PROVIDER: Dwight Isles, MD  END OF SESSION:  PT End of Session - 02/09/23 1018     Visit Number 22    Number of Visits 29    Date for PT Re-Evaluation 03/01/23    Authorization Type [employer]    PT Start Time 1016    PT Stop Time 1055    PT Time Calculation (min) 39 min    Activity Tolerance Patient tolerated treatment well    Behavior During Therapy WFL for tasks assessed/performed                Past Medical History:  Diagnosis Date   Abdominal pain 06/06/2018   Acute renal failure (ARF) (HCC) 06/22/2018   Anemia in chronic kidney disease 08/25/2018   Anemia, unspecified 07/05/2018   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 states she has 2 aneurysms   Chronic low back pain 04/12/2017   Chronic pain syndrome 04/12/2017   Closed fracture of left distal radius    De Quervain's tenosynovitis 02/10/2018   Degeneration of lumbar intervertebral disc 03/01/2017   End stage renal failure on dialysis [facility])    M/W/F on [employer]   GERD (gastroesophageal reflux disease)    Goodpasture syndrome (HCC)    Headache 10/30/2014   Hyperlipidemia 04/17/2018   Hypertension    Hypothyroidism    Iron deficiency anemia, unspecified 07/12/2018   PONV (postoperative nausea and vomiting)    violent vomiting   Sacral back pain 05/25/2016   Scoliosis of lumbar spine 03/01/2017   Secondary hyperparathyroidism of renal origin (HCC) 08/22/2018   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   Thyroid  disease    Trigger finger of left thumb 02/10/2018   Past Surgical History:  Procedure Laterality Date   ARTERY BIOPSY Right 10/31/2014   Procedure: BIOPSY TEMPORAL ARTERY-RIGHT;   Surgeon: Lynwood JONETTA Collum, MD;  Location: [facility] OR;  Service: Vascular;  Laterality: Right;   AV FISTULA PLACEMENT Left 10/11/2018   Procedure: left arm ARTERIOVENOUS (AV) FISTULA  creation;  Surgeon: Sheree Penne Bruckner, MD;  Location: [facility] OR;  Service: Vascular;  Laterality: Left;   BILATERAL CARPAL TUNNEL RELEASE     BIOPSY  04/29/2019   Procedure: BIOPSY;  Surgeon: Elicia Claw, MD;  Location: [facility];  Service: Gastroenterology;;   BIOPSY  11/05/2019   Procedure: BIOPSY;  Surgeon: Elicia Claw, MD;  Location: [facility];  Service: Gastroenterology;;   BREAST SURGERY Left    lumpectomy   BUNIONECTOMY Right    CHOLECYSTECTOMY N/A 10/13/2016   Procedure: LAPAROSCOPIC CHOLECYSTECTOMY WITH INTRAOPERATIVE CHOLANGIOGRAM;  Surgeon: Belinda Cough, MD;  Location: [facility] OR;  Service: General;  Laterality: N/A;   COLONOSCOPY WITH PROPOFOL  N/A 11/05/2019   Procedure: COLONOSCOPY WITH PROPOFOL ;  Surgeon: Elicia Claw, MD;  Location: [facility];  Service: Gastroenterology;  Laterality: N/A;   ESOPHAGOGASTRODUODENOSCOPY (EGD) WITH PROPOFOL  N/A 04/29/2019   Procedure: ESOPHAGOGASTRODUODENOSCOPY (EGD) WITH PROPOFOL ;  Surgeon: Elicia Claw, MD;  Location: [facility];  Service: Gastroenterology;  Laterality: N/A;   EYE SURGERY     surgery for cross eye   FOOT FRACTURE SURGERY     OPEN REDUCTION INTERNAL FIXATION (ORIF) DISTAL RADIAL FRACTURE Left 03/29/2017   Procedure: OPEN REDUCTION INTERNAL  FIXATION (ORIF)LEFT  DISTAL RADIAL FRACTURE;  Surgeon: Murrell Drivers, MD;  Location: [facility];  Service: Orthopedics;  Laterality: Left;   POLYPECTOMY  11/05/2019   Procedure: POLYPECTOMY;  Surgeon: Elicia Claw, MD;  Location: [facility];  Service: Gastroenterology;;   THYROIDECTOMY     TONSILLECTOMY     WISDOM TOOTH EXTRACTION     Patient Active Problem List   Diagnosis Date Noted   Hypertensive crisis 10/23/2019   History of cerebral aneurysm 10/23/2019   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   Hypoxemia 02/21/2019   Pulmonary edema 02/20/2019   Encounter for immunization 09/26/2018   Anemia in chronic kidney disease 08/25/2018   End stage renal disease (HCC) 08/25/2018   Secondary hyperparathyroidism of renal origin (HCC) 08/22/2018   Referred otalgia of left ear 08/16/2018   Iron deficiency anemia, unspecified 07/12/2018   Shortness of breath 07/06/2018   Anemia, unspecified 07/05/2018   Anxiety 07/05/2018   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   Acute renal failure (ARF) (HCC) 06/22/2018   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   Abdominal pain 06/06/2018   Hydronephrosis due to obstruction of ureteral orifice 06/06/2018   Disease of thyroid  gland 04/17/2018   Hyperlipidemia 04/17/2018   Hypertension 04/17/2018   De Quervain's tenosynovitis 02/10/2018   Trigger finger of left thumb 02/10/2018   Chronic low back pain 04/12/2017   Chronic pain syndrome 04/12/2017   Degeneration of lumbar intervertebral disc 03/01/2017   Scoliosis of lumbar spine 03/01/2017   Sacral back pain 05/25/2016   Eye pain 10/30/2014    ONSET DATE: 11/10/2022 (referral)   REFERRING DIAG:  F51.937 (ICD-10-CM) - Spinal stenosis, lumbar region with neurogenic claudication    THERAPY DIAG:  Muscle weakness (generalized)  Other abnormalities of gait and mobility  Unsteadiness on feet  Cervicalgia  Other low back pain  Rationale for Evaluation and Treatment: Rehabilitation  SUBJECTIVE:                                                                                                                                                                                             SUBJECTIVE STATEMENT: Patient reports doing well. Let's not ride the bike and say we did. Denies falls.   Pt accompanied by: self  PERTINENT HISTORY: hypothyroidism; HTN; HLD; Goodpasture syndrome; cerebral aneurysm; ESRD on TTS HD; and  chronic pain, kidney replacement (12/14/2020).   PAIN:  Are you having pain? Yes: NPRS scale: 4 Pain location: RLE, low back and neck Pain description: achy, throbbing   PRECAUTIONS: Cervical and Fall  PATIENT GOALS: To establish mobility so that I do not fall and  I feel confident   OBJECTIVE:  Note: Objective measures were completed at Evaluation unless otherwise noted.  DIAGNOSTIC FINDINGS: MRI of lumbar spine from 2022   Disc levels:   T12- L1: Unremarkable.   L1-L2: Ventral spondylitic spurring   L2-L3: Disc narrowing and bulging with mild right facet spurring.   L3-L4: Disc narrowing and bulging.  Mild facet spurring   L4-L5: Disc narrowing and bulging with left inferior foraminal protrusion. Asymmetric left facet spurring. Left foraminal stenosis is mild   L5-S1:Disc narrowing and bulging with endplate spurring eccentric to the left. Negative facets.   IMPRESSION: Generalized lumbar spine degeneration similar to 2017. No impingement to explain right leg symptoms.  VITALS Vitals:   02/09/23 1020  BP: 133/75  Pulse: 64        TODAY'S TREATMENT:      Therex: -anterior ball roll out  -iso core press down on physioball  -seated on physioball marches  -hooklying marches -supine SKTC -supine open book stretch  Manual: -gentle MFR to sub occipitals, SCM origin in supine    PATIENT EDUCATION: Education details: continue HEP Person educated: Patient Education method: Medical Illustrator Education comprehension: verbalized understanding, returned demonstration, and needs further education  HOME EXERCISE PROGRAM: Verbally added seated march overs on eval   Access Code: [email or web address] URL: [email or web address] Date: 11/23/2022 Prepared by: Marlon Plaster  Exercises - Seated Piriformis Stretch with Trunk Bend  - 1 x daily - 7 x weekly - 3 sets - 30-45 second  hold - Supine Figure 4 Piriformis Stretch  - 1 x daily - 7 x weekly -  3 sets - 45-60 second hold - Supine Straight Leg Lumbar Rotation Stretch  - 1 x daily - 7 x weekly - 1 sets - 5-10 reps - 10-20 second hold - Seated Hamstring Stretch  - 1 x daily - 7 x weekly - 3 sets - 10 reps - Supine Butterfly Groin Stretch  - 1 x daily - 7 x weekly - 3 sets - 10 reps - Forward Step Down with Heel Tap and Rail Support  - 1 x daily - 7 x weekly - 3 sets - 10 reps - Side Step Down with Counter Support  - 1 x daily - 7 x weekly - 3 sets - 10 reps - Brandt-Daroff Vestibular Exercise  - 1 x daily - 7 x weekly - 3 sets - 10 reps - ITB Stretch at Wall  - 1 x daily - 7 x weekly - 3 sets - 30-60 second hold - Supine Chin Tuck  - 1 x daily - 7 x weekly - 3 sets - 10 reps - 2-5 second hold - Chin Tuck  - 1 x daily - 7 x weekly - 3 sets - 10 reps - 2-5 second hold  -Verbally added march overs in long-sit on 02/03/23   Access Code: [email or web address] URL: [email or web address] Date: 02/07/2023 Prepared by: Raj Blanch  Exercises - Hooklying Sacroiliac Joint Isometric  - 1 x daily - 7 x weekly - 10 reps - 10 sec hold - Bilateral Bent Leg Lift  - 1 x daily - 7 x weekly - 3 sets - 10 reps  GOALS: Goals reviewed with patient? Yes    NEW SHORT TERM GOALS FOR UPDATED POC:   Target date: 02/01/2023  Pt will demonstrate proper lifting technique of 5# object w/no more than 4/10 pain for improved posterior chain strength and safety at home  Baseline: Hands braced on thighs, >8/10 pain  Goal status:  MET  2.  Pt will ambulate greater than or equal to 1700 feet on with <5/10 RPE for improved cardiovascular endurance and BLE strength.  Baseline: 1583' no AD RPE of 8-10/10; 1596' outside no AD, RPE of 5/10  Goal status: NOT MET   NEW LONG TERM GOALS FOR UPDATED POC:  Target date: 03/01/2023  Pt will be independent with final HEP for improved strength, balance, transfers and gait. Baseline:  Goal status: IN PROGRESS  2.  Pt will lift 5# from [facility] shelf w/proper form and no  more than 4/10 pain for improved body mechanics and reduced pain levels  Baseline:  Goal status: INITIAL    ASSESSMENT:  CLINICAL IMPRESSION: Patient seen for skilled PT session with emphasis on general mobility and flexibility. Challenged by core stability tasks on compliant surface, such as the physioball with reports of increased difficulty with L hip flexion > R. Increased neural tension noted with relative extension tasks. Continue POC.    OBJECTIVE IMPAIRMENTS: Abnormal gait, decreased activity tolerance, decreased balance, decreased endurance, decreased mobility, difficulty walking, decreased ROM, decreased strength, impaired flexibility, impaired sensation, postural dysfunction, and pain  ACTIVITY LIMITATIONS: lifting, standing, stairs, transfers, bed mobility, bathing, hygiene/grooming, locomotion level, and caring for others  PARTICIPATION LIMITATIONS: cleaning, laundry, shopping, community activity, and yard work  PERSONAL FACTORS: Age, Fitness, Past/current experiences, and 1-2 comorbidities: Lumbar stenosis, 3 brain aneurysms, kidney transplant in 2022  are also affecting patient's functional outcome.   REHAB POTENTIAL: Good  CLINICAL DECISION MAKING: Evolving/moderate complexity  EVALUATION COMPLEXITY: Moderate  PLAN:  PT FREQUENCY: 2x/week  PT DURATION: 6 weeks + 8 weeks (recert)  PLANNED INTERVENTIONS: 02835- PT Re-evaluation, 97110-Therapeutic exercises, 97530- Therapeutic activity, 97112- Neuromuscular re-education, 97535- Self Care, 02859- Manual therapy, [phone]- Gait training, [phone]- Orthotic Fit/training, [phone]- Aquatic Therapy, Patient/Family education, Balance training, Stair training, Dry Needling, Joint mobilization, Vestibular training, and DME instructions  PLAN FOR NEXT SESSION: Core stability. How is neck? Check BP. Add to HEP for improved spinal and R hip mobility, strength. Global endurance and functional strength (lifting), eccentric control, head  turns, anticipatory balance, blaze pods, rebounder, rockerboard, scapular retraction, slam balls, scap push ups, upright rows, chin tucks    Delon DELENA Pop, PT Delon DELENA Pop, PT, DPT, CBIS  02/09/2023, 11:41 AM

## 2023-02-14 ENCOUNTER — Ambulatory Visit: Payer: Medicare HMO | Admitting: Physical Therapy

## 2023-02-14 VITALS — BP 133/65 | HR 63

## 2023-02-14 DIAGNOSIS — R2689 Other abnormalities of gait and mobility: Secondary | ICD-10-CM

## 2023-02-14 DIAGNOSIS — M542 Cervicalgia: Secondary | ICD-10-CM | POA: Diagnosis not present

## 2023-02-14 DIAGNOSIS — M5431 Sciatica, right side: Secondary | ICD-10-CM | POA: Diagnosis not present

## 2023-02-14 DIAGNOSIS — M6281 Muscle weakness (generalized): Secondary | ICD-10-CM | POA: Diagnosis not present

## 2023-02-14 DIAGNOSIS — R296 Repeated falls: Secondary | ICD-10-CM | POA: Diagnosis not present

## 2023-02-14 DIAGNOSIS — R2681 Unsteadiness on feet: Secondary | ICD-10-CM | POA: Diagnosis not present

## 2023-02-14 DIAGNOSIS — M5459 Other low back pain: Secondary | ICD-10-CM | POA: Diagnosis not present

## 2023-02-14 DIAGNOSIS — Z1231 Encounter for screening mammogram for malignant neoplasm of breast: Secondary | ICD-10-CM | POA: Diagnosis not present

## 2023-02-14 NOTE — Therapy (Signed)
 OUTPATIENT PHYSICAL THERAPY NEURO TREATMENT   Patient Name: Cristina Wilson MRN: [id number] DOB:[birth year], 73 y.o., female Today's Date: 02/14/2023   PCP: Alvie Ax, MD REFERRING PROVIDER: Ginnie Laine, MD  END OF SESSION:  PT End of Session - 02/14/23 1021     Visit Number 23    Number of Visits 29    Date for PT Re-Evaluation 03/01/23    Authorization Type [employer]    PT Start Time 1018    PT Stop Time 1058    PT Time Calculation (min) 40 min    Equipment Utilized During Treatment Gait belt    Activity Tolerance Patient tolerated treatment well    Behavior During Therapy WFL for tasks assessed/performed                Past Medical History:  Diagnosis Date   Abdominal pain 06/06/2018   Acute renal failure (ARF) (HCC) 06/22/2018   Anemia in chronic kidney disease 08/25/2018   Anemia, unspecified 07/05/2018   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 states she has 2 aneurysms   Chronic low back pain 04/12/2017   Chronic pain syndrome 04/12/2017   Closed fracture of left distal radius    De Quervain's tenosynovitis 02/10/2018   Degeneration of lumbar intervertebral disc 03/01/2017   End stage renal failure on dialysis [facility])    M/W/F on [employer]   GERD (gastroesophageal reflux disease)    Goodpasture syndrome (HCC)    Headache 10/30/2014   Hyperlipidemia 04/17/2018   Hypertension    Hypothyroidism    Iron deficiency anemia, unspecified 07/12/2018   PONV (postoperative nausea and vomiting)    violent vomiting   Sacral back pain 05/25/2016   Scoliosis of lumbar spine 03/01/2017   Secondary hyperparathyroidism of renal origin (HCC) 08/22/2018   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   Thyroid  disease    Trigger finger of left thumb 02/10/2018   Past Surgical History:  Procedure Laterality Date   ARTERY BIOPSY Right  10/31/2014   Procedure: BIOPSY TEMPORAL ARTERY-RIGHT;  Surgeon: Palma Bob, MD;  Location[facility] OR;  Service: Vascular;  Laterality: Right;   AV FISTULA PLACEMENT Left 10/11/2018   Procedure: left arm ARTERIOVENOUS (AV) FISTULA  creation;  Surgeon: Adine Hoof, MD;  Location[facility] OR;  Service: Vascular;  Laterality: Left;   BILATERAL CARPAL TUNNEL RELEASE     BIOPSY  04/29/2019   Procedure: BIOPSY;  Surgeon: Felecia Hopper, MD;  Location: [facility] END[facility]COPY;  Service: Gastroenterology;;   BIOPSY  11/05/2019   Procedure: BIOPSY;  Surgeon: Felecia Hopper, MD;  Location: [facility]COPY;  Service: Gastroenterology;;   BREAST SURGERY Left    lumpectomy   BUNIONECTOMY Right    CHOLECYSTECTOMY N/A 10/13/2016   Procedure: LAPAROSCOPIC CHOLECYSTECTOMY WITH INTRAOPERATIVE CHOLANGIOGRAM;  Surgeon: Dareen Ebbing, MD;  Location[facility] OR;  Service: General;  Laterality: N/A;   COLONOSCOPY WITH PROPOFOL  N/A 11/05/2019   Procedure: COLONOSCOPY WITH PROPOFOL ;  Surgeon: Felecia Hopper, MD;  Location: [facility];  Service: Gastroenterology;  Laterality: N/A;   ESOPHAGOGASTRODUODENOSCOPY (EGD) WITH PROPOFOL  N/A 04/29/2019   Procedure: ESOPHAGOGASTRODUODENOSCOPY (EGD) WITH PROPOFOL ;  Surgeon: Felecia Hopper, MD;  Location: [facility];  Service: Gastroenterology;  Laterality: N/A;   EYE SURGERY     surgery for cross eye   FOOT FRACTURE SURGERY     OPEN REDUCTION INTERNAL FIXATION (ORIF) DISTAL RADIAL  FRACTURE Left 03/29/2017   Procedure: OPEN REDUCTION INTERNAL FIXATION (ORIF)LEFT  DISTAL RADIAL FRACTURE;  Surgeon: Brunilda Capra, MD;  Location: [facility];  Service: Orthopedics;  Laterality: Left;   POLYPECTOMY  11/05/2019   Procedure: POLYPECTOMY;  Surgeon: Felecia Hopper, MD;  Location: [facility];  Service: Gastroenterology;;   THYROIDECTOMY     TONSILLECTOMY     WISDOM TOOTH EXTRACTION     Patient Active Problem List   Diagnosis Date Noted   Hypertensive crisis  10/23/2019   History of cerebral aneurysm 10/23/2019   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   Hypoxemia 02/21/2019   Pulmonary edema 02/20/2019   Encounter for immunization 09/26/2018   Anemia in chronic kidney disease 08/25/2018   End stage renal disease (HCC) 08/25/2018   Secondary hyperparathyroidism of renal origin (HCC) 08/22/2018   Referred otalgia of left ear 08/16/2018   Iron deficiency anemia, unspecified 07/12/2018   Shortness of breath 07/06/2018   Anemia, unspecified 07/05/2018   Anxiety 07/05/2018   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   Acute renal failure (ARF) (HCC) 06/22/2018   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   Abdominal pain 06/06/2018   Hydronephrosis due to obstruction of ureteral orifice 06/06/2018   Disease of thyroid  gland 04/17/2018   Hyperlipidemia 04/17/2018   Hypertension 04/17/2018   De Quervain's tenosynovitis 02/10/2018   Trigger finger of left thumb 02/10/2018   Chronic low back pain 04/12/2017   Chronic pain syndrome 04/12/2017   Degeneration of lumbar intervertebral disc 03/01/2017   Scoliosis of lumbar spine 03/01/2017   Sacral back pain 05/25/2016   Eye pain 10/30/2014    ONSET DATE: 11/10/2022 (referral)   REFERRING DIAG:  V40.981 (ICD-10-CM) - Spinal stenosis, lumbar region with neurogenic claudication    THERAPY DIAG:  Muscle weakness (generalized)  Other abnormalities of gait and mobility  Unsteadiness on feet  Cervicalgia  Rationale for Evaluation and Treatment: Rehabilitation  SUBJECTIVE:                                                                                                                                                                                             SUBJECTIVE STATEMENT: Patient reports doing well. Thinks the exercises Hosey Macadam gave her have been really helpful. Had some pain this morning but has gotten a bit better   Pt accompanied by:  self  PERTINENT HISTORY: hypothyroidism; HTN; HLD; Goodpasture syndrome; cerebral aneurysm; ESRD on TTS HD; and chronic pain, kidney replacement (12/14/2020).   PAIN:  Are you having pain? Yes: NPRS scale: 3-4 Pain location: RLE, low back and neck Pain description: achy, throbbing   PRECAUTIONS: Cervical and  Fall  PATIENT GOALS: "To establish mobility so that I do not fall and I feel confident"   OBJECTIVE:  Note: Objective measures were completed at Evaluation unless otherwise noted.  DIAGNOSTIC FINDINGS: MRI of lumbar spine from 2022   Disc levels:   T12- L1: Unremarkable.   L1-L2: Ventral spondylitic spurring   L2-L3: Disc narrowing and bulging with mild right facet spurring.   L3-L4: Disc narrowing and bulging.  Mild facet spurring   L4-L5: Disc narrowing and bulging with left inferior foraminal protrusion. Asymmetric left facet spurring. Left foraminal stenosis is mild   L5-S1:Disc narrowing and bulging with endplate spurring eccentric to the left. Negative facets.   IMPRESSION: Generalized lumbar spine degeneration similar to 2017. No impingement to explain right leg symptoms.  VITALS Vitals:   02/14/23 1028  BP: 133/65  Pulse: 63     TODAY'S TREATMENT:      Self-care Assessed vitals (see above) and WNL for session.   Ther Act  At ballet bar, 6 Blaze pods on random reach setting for improved glute med strength, LE coordination and single leg stability.  Performed on 2 minute intervals with 1 minute rest periods.  Pt requires SBA guarding and BUE support. Round 1:  3 pods placed in vertical line to either side of pt. Tied red theraband around ankles.  55 hits. Round 2:  same setup w/pods placed further apart.  58 hits. Round 3:  same setup.  76 hits. Notable errors/deficits:  increased difficulty hitting pod on posterolateral R side due to glute med weakness  RPE of 11/10 following session  Stool scoots w/4# ankle weights on BLEs, x230' fwd and x115'  retro, for improved step clearance, hip flexor strength and posterior chain strength. Pt drifting to L side in both forward and retro directions and required max cues to pick up RLE and avoid sliding foot on ground. Pt reported 8/10 fatigue w/activity.     PATIENT EDUCATION: Education details: continue HEP Person educated: Patient Education method: Medical illustrator Education comprehension: verbalized understanding, returned demonstration, and needs further education  HOME EXERCISE PROGRAM: Verbally added seated march overs on eval   Access Code: [email or web address] URL: [email or web address] Date: 11/23/2022 Prepared by: Burleigh Carp Isabellah Sobocinski  Exercises - Seated Piriformis Stretch with Trunk Bend  - 1 x daily - 7 x weekly - 3 sets - 30-45 second  hold - Supine Figure 4 Piriformis Stretch  - 1 x daily - 7 x weekly - 3 sets - 45-60 second hold - Supine Straight Leg Lumbar Rotation Stretch  - 1 x daily - 7 x weekly - 1 sets - 5-10 reps - 10-20 second hold - Seated Hamstring Stretch  - 1 x daily - 7 x weekly - 3 sets - 10 reps - Supine Butterfly Groin Stretch  - 1 x daily - 7 x weekly - 3 sets - 10 reps - Forward Step Down with Heel Tap and Rail Support  - 1 x daily - 7 x weekly - 3 sets - 10 reps - Side Step Down with Counter Support  - 1 x daily - 7 x weekly - 3 sets - 10 reps - Brandt-Daroff Vestibular Exercise  - 1 x daily - 7 x weekly - 3 sets - 10 reps - ITB Stretch at Wall  - 1 x daily - 7 x weekly - 3 sets - 30-60 second hold - Supine Chin Tuck  - 1 x daily - 7 x weekly - 3 sets - 10 reps -  2-5 second hold - Chin Tuck  - 1 x daily - 7 x weekly - 3 sets - 10 reps - 2-5 second hold  -Verbally added march overs in long-sit on 02/03/23   Access Code: [email or web address] URL: [email or web address] Date: 02/07/2023 Prepared by: Susanna Epley  Exercises - Hooklying Sacroiliac Joint Isometric  - 1 x daily - 7 x weekly - 10 reps - 10 sec hold - Bilateral Bent Leg Lift  -  1 x daily - 7 x weekly - 3 sets - 10 reps  GOALS: Goals reviewed with patient? Yes    NEW SHORT TERM GOALS FOR UPDATED POC:   Target date: 02/01/2023  Pt will demonstrate proper lifting technique of 5# object w/no more than 4/10 pain for improved posterior chain strength and safety at home  Baseline: Hands braced on thighs, >8/10 pain  Goal status: MET  2.  Pt will ambulate greater than or equal to 1700 feet on with <5/10 RPE for improved cardiovascular endurance and BLE strength.  Baseline: 1583' no AD RPE of 8-10/10; 1596' outside no AD, RPE of 5/10  Goal status: NOT MET   NEW LONG TERM GOALS FOR UPDATED POC:  Target date: 03/01/2023  Pt will be independent with final HEP for improved strength, balance, transfers and gait. Baseline:  Goal status: IN PROGRESS  2.  Pt will lift 5# from [facility] shelf w/proper form and no more than 4/10 pain for improved body mechanics and reduced pain levels  Baseline:  Goal status: INITIAL    ASSESSMENT:  CLINICAL IMPRESSION: Emphasis of skilled PT session on functional hip strength, single leg stability and increased step clearance. Pt reports her MET exercises have been helpful for her low back, does not improve her sciatica. Pt continues to be limited by decreased functional hip strength, specifically glute med on the R side. Pt also requires cues to pick up R foot during stool scooting activity, as she tends to drag foot.  Continue POC.    OBJECTIVE IMPAIRMENTS: Abnormal gait, decreased activity tolerance, decreased balance, decreased endurance, decreased mobility, difficulty walking, decreased ROM, decreased strength, impaired flexibility, impaired sensation, postural dysfunction, and pain  ACTIVITY LIMITATIONS: lifting, standing, stairs, transfers, bed mobility, bathing, hygiene/grooming, locomotion level, and caring for others  PARTICIPATION LIMITATIONS: cleaning, laundry, shopping, community activity, and yard work  PERSONAL FACTORS:  Age, Fitness, Past/current experiences, and 1-2 comorbidities: Lumbar stenosis, 3 brain aneurysms, kidney transplant in 2022  are also affecting patient's functional outcome.   REHAB POTENTIAL: Good  CLINICAL DECISION MAKING: Evolving/moderate complexity  EVALUATION COMPLEXITY: Moderate  PLAN:  PT FREQUENCY: 2x/week  PT DURATION: 6 weeks + 8 weeks (recert)  PLANNED INTERVENTIONS: 54098- PT Re-evaluation, 97110-Therapeutic exercises, 97530- Therapeutic activity, 97112- Neuromuscular re-education, 97535- Self Care, 11914- Manual therapy, [phone]- Gait training, [phone]- Orthotic Fit/training, [phone]- Aquatic Therapy, Patient/Family education, Balance training, Stair training, Dry Needling, Joint mobilization, Vestibular training, and DME instructions  PLAN FOR NEXT SESSION: Core stability. How is neck? Check BP. Add to HEP for improved spinal and R hip mobility, strength. Global endurance and functional strength (lifting), eccentric control, head turns, anticipatory balance, blaze pods, rebounder, rockerboard, scapular retraction, slam balls, scap push ups, upright rows, chin tucks    Faatima Tench E Lael Pilch, PT, DPT 02/14/2023, 10:58 AM

## 2023-02-16 ENCOUNTER — Ambulatory Visit: Payer: Medicare HMO | Admitting: Physical Therapy

## 2023-02-16 VITALS — BP 111/60 | HR 60

## 2023-02-16 DIAGNOSIS — R296 Repeated falls: Secondary | ICD-10-CM | POA: Diagnosis not present

## 2023-02-16 DIAGNOSIS — R2681 Unsteadiness on feet: Secondary | ICD-10-CM

## 2023-02-16 DIAGNOSIS — M6281 Muscle weakness (generalized): Secondary | ICD-10-CM | POA: Diagnosis not present

## 2023-02-16 DIAGNOSIS — M5459 Other low back pain: Secondary | ICD-10-CM | POA: Diagnosis not present

## 2023-02-16 DIAGNOSIS — R2689 Other abnormalities of gait and mobility: Secondary | ICD-10-CM

## 2023-02-16 DIAGNOSIS — M5431 Sciatica, right side: Secondary | ICD-10-CM | POA: Diagnosis not present

## 2023-02-16 DIAGNOSIS — M542 Cervicalgia: Secondary | ICD-10-CM | POA: Diagnosis not present

## 2023-02-16 NOTE — Therapy (Signed)
OUTPATIENT PHYSICAL THERAPY NEURO TREATMENT   Patient Name: Cristina Wilson MRN: [id number] DOB:1950/02/10, 73 y.o., female Today's Date: 02/16/2023   PCP: Merlene Laughter, MD REFERRING PROVIDER: Joaquim Nam, MD  END OF SESSION:  PT End of Session - 02/16/23 1319     Visit Number 24    Number of Visits 29    Date for PT Re-Evaluation 03/01/23    Authorization Type [employer]    PT Start Time 1317    PT Stop Time 1401    PT Time Calculation (min) 44 min    Equipment Utilized During Treatment Gait belt    Activity Tolerance Patient tolerated treatment well    Behavior During Therapy WFL for tasks assessed/performed                Past Medical History:  Diagnosis Date   Abdominal pain 06/06/2018   Acute renal failure (ARF) (HCC) 06/22/2018   Anemia in chronic kidney disease 08/25/2018   Anemia, unspecified 07/05/2018   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 states she has 2 aneurysms   Chronic low back pain 04/12/2017   Chronic pain syndrome 04/12/2017   Closed fracture of left distal radius    De Quervain's tenosynovitis 02/10/2018   Degeneration of lumbar intervertebral disc 03/01/2017   End stage renal failure on dialysis 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   Thyroid disease    Trigger finger of left thumb 02/10/2018   Past Surgical History:  Procedure Laterality Date   ARTERY BIOPSY Right  10/31/2014   Procedure: BIOPSY TEMPORAL ARTERY-RIGHT;  Surgeon: Pryor Ochoa, MD;  Location: [facility] OR;  Service: Vascular;  Laterality: Right;   AV FISTULA PLACEMENT Left 10/11/2018   Procedure: left arm ARTERIOVENOUS (AV) FISTULA  creation;  Surgeon: Maeola Harman, MD;  Location: [facility] OR;  Service: Vascular;  Laterality: Left;   BILATERAL CARPAL TUNNEL RELEASE     BIOPSY  04/29/2019   Procedure: BIOPSY;  Surgeon: Kathi Der, MD;  Location: [facility] ENDOSCOPY;  Service: Gastroenterology;;   BIOPSY  11/05/2019   Procedure: BIOPSY;  Surgeon: Kathi Der, MD;  Location: [facility];  Service: Gastroenterology;;   BREAST SURGERY Left    lumpectomy   BUNIONECTOMY Right    CHOLECYSTECTOMY N/A 10/13/2016   Procedure: LAPAROSCOPIC CHOLECYSTECTOMY WITH INTRAOPERATIVE CHOLANGIOGRAM;  Surgeon: Manus Rudd, MD;  Location: [facility] OR;  Service: General;  Laterality: N/A;   COLONOSCOPY WITH PROPOFOL N/A 11/05/2019   Procedure: COLONOSCOPY WITH PROPOFOL;  Surgeon: Kathi Der, MD;  Location: [facility];  Service: Gastroenterology;  Laterality: N/A;   ESOPHAGOGASTRODUODENOSCOPY (EGD) WITH PROPOFOL N/A 04/29/2019   Procedure: ESOPHAGOGASTRODUODENOSCOPY (EGD) WITH PROPOFOL;  Surgeon: Kathi Der, MD;  Location: [facility];  Service: Gastroenterology;  Laterality: N/A;   EYE SURGERY     surgery for cross eye   FOOT FRACTURE SURGERY     OPEN REDUCTION INTERNAL FIXATION (ORIF) DISTAL RADIAL  FRACTURE Left 03/29/2017   Procedure: OPEN REDUCTION INTERNAL FIXATION (ORIF)LEFT  DISTAL RADIAL FRACTURE;  Surgeon: Betha Loa, MD;  Location: [facility];  Service: Orthopedics;  Laterality: Left;   POLYPECTOMY  11/05/2019   Procedure: POLYPECTOMY;  Surgeon: Kathi Der, MD;  Location: [facility];  Service: Gastroenterology;;   THYROIDECTOMY     TONSILLECTOMY     WISDOM TOOTH EXTRACTION     Patient Active Problem List   Diagnosis Date Noted   Hypertensive crisis  10/23/2019   History of cerebral aneurysm 10/23/2019   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   Hypoxemia 02/21/2019   Pulmonary edema 02/20/2019   Encounter for immunization 09/26/2018   Anemia in chronic kidney disease 08/25/2018   End stage renal disease (HCC) 08/25/2018   Secondary hyperparathyroidism of renal origin (HCC) 08/22/2018   Referred otalgia of left ear 08/16/2018   Iron deficiency anemia, unspecified 07/12/2018   Shortness of breath 07/06/2018   Anemia, unspecified 07/05/2018   Anxiety 07/05/2018   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   Acute renal failure (ARF) (HCC) 06/22/2018   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   Abdominal pain 06/06/2018   Hydronephrosis due to obstruction of ureteral orifice 06/06/2018   Disease of thyroid gland 04/17/2018   Hyperlipidemia 04/17/2018   Hypertension 04/17/2018   De Quervain's tenosynovitis 02/10/2018   Trigger finger of left thumb 02/10/2018   Chronic low back pain 04/12/2017   Chronic pain syndrome 04/12/2017   Degeneration of lumbar intervertebral disc 03/01/2017   Scoliosis of lumbar spine 03/01/2017   Sacral back pain 05/25/2016   Eye pain 10/30/2014    ONSET DATE: 11/10/2022 (referral)   REFERRING DIAG:  Z61.096 (ICD-10-CM) - Spinal stenosis, lumbar region with neurogenic claudication    THERAPY DIAG:  Muscle weakness (generalized)  Other abnormalities of gait and mobility  Unsteadiness on feet  Other low back pain  Rationale for Evaluation and Treatment: Rehabilitation  SUBJECTIVE:                                                                                                                                                                                             SUBJECTIVE STATEMENT: Patient reports doing okay. Had a lot of pain yesterday, describing as soreness. No falls.   Pt accompanied by: self  PERTINENT HISTORY: hypothyroidism; HTN; HLD; Goodpasture  syndrome; cerebral aneurysm; ESRD on TTS HD; and chronic pain, kidney replacement (12/14/2020).   PAIN:  Are you having pain? Yes: NPRS scale: 5 Pain location: RLE, low back and neck Pain description: achy, throbbing   PRECAUTIONS: Cervical and Fall  PATIENT GOALS: "To establish mobility  so that I do not fall and I feel confident"   OBJECTIVE:  Note: Objective measures were completed at Evaluation unless otherwise noted.  DIAGNOSTIC FINDINGS: MRI of lumbar spine from 2022   Disc levels:   T12- L1: Unremarkable.   L1-L2: Ventral spondylitic spurring   L2-L3: Disc narrowing and bulging with mild right facet spurring.   L3-L4: Disc narrowing and bulging.  Mild facet spurring   L4-L5: Disc narrowing and bulging with left inferior foraminal protrusion. Asymmetric left facet spurring. Left foraminal stenosis is mild   L5-S1:Disc narrowing and bulging with endplate spurring eccentric to the left. Negative facets.   IMPRESSION: Generalized lumbar spine degeneration similar to 2017. No impingement to explain right leg symptoms.  VITALS Vitals:   02/16/23 1324 02/16/23 1326  BP: 95/61 111/60  Pulse: (!) 57 60      TODAY'S TREATMENT:      Self-care Assessed vitals (see above) and BP low but WNL for session.   Ther Act  At ballet bar, 6 Blaze pods on random reach setting for improved glute med strength, single leg stability and LE coordination.  Performed on 2 minute intervals with 1 minute rest periods.  Pt requires CGA guarding. Round 1:  3 pods placed to either side of pt in vertical lines w/red theraband around pt's ankles. No UE support.  41 hits. Round 2:  same setup w/intermittent UE support due to fatigue.  48 hits. Notable errors/deficits:  continued difficulty tapping pod placed posterolaterally to R side  In // bars for improved single leg stability, BLE strength and step clearance:  On blue balance beam, side stepping over 6" hurdles w/BUE support, x6 reps down  and back. Max verbal cues to facilitate R hip flexion to step over hurdle, as pt tends to swing leg around hurdle flexing only her knee. CGA throughout  On rockerboard in A/P direction, chest presses w/4# ball x8 reps w/CGA. Cued pt to engage lats to reduce strain on shoulders and for core activation to reduce strain on back. Pt reported movement hurt her back too much, so halted activity  Seated thoracic extension stretch w/half moon foam roller x4 minutes w/added overpressure to anterior shoulders. Pt reported feeling good stretch with this.     PATIENT EDUCATION: Education details: continue HEP Person educated: Patient Education method: Medical illustrator Education comprehension: verbalized understanding, returned demonstration, and needs further education  HOME EXERCISE PROGRAM: Verbally added seated march overs on eval   Access Code: [email or web address] URL: [email or web address] Date: 11/23/2022 Prepared by: Alethia Berthold Olliver Boyadjian  Exercises - Seated Piriformis Stretch with Trunk Bend  - 1 x daily - 7 x weekly - 3 sets - 30-45 second  hold - Supine Figure 4 Piriformis Stretch  - 1 x daily - 7 x weekly - 3 sets - 45-60 second hold - Supine Straight Leg Lumbar Rotation Stretch  - 1 x daily - 7 x weekly - 1 sets - 5-10 reps - 10-20 second hold - Seated Hamstring Stretch  - 1 x daily - 7 x weekly - 3 sets - 10 reps - Supine Butterfly Groin Stretch  - 1 x daily - 7 x weekly - 3 sets - 10 reps - Forward Step Down with Heel Tap and Rail Support  - 1 x daily - 7 x weekly - 3 sets - 10 reps - Side Step Down with Counter Support  - 1 x daily - 7 x weekly - 3 sets - 10 reps - Brandt-Daroff Vestibular Exercise  -  1 x daily - 7 x weekly - 3 sets - 10 reps - ITB Stretch at Wall  - 1 x daily - 7 x weekly - 3 sets - 30-60 second hold - Supine Chin Tuck  - 1 x daily - 7 x weekly - 3 sets - 10 reps - 2-5 second hold - Chin Tuck  - 1 x daily - 7 x weekly - 3 sets - 10 reps - 2-5 second  hold  -Verbally added march overs in long-sit on 02/03/23   Access Code: [email or web address] URL: [email or web address] Date: 02/07/2023 Prepared by: Lavone Nian  Exercises - Hooklying Sacroiliac Joint Isometric  - 1 x daily - 7 x weekly - 10 reps - 10 sec hold - Bilateral Bent Leg Lift  - 1 x daily - 7 x weekly - 3 sets - 10 reps  GOALS: Goals reviewed with patient? Yes    NEW SHORT TERM GOALS FOR UPDATED POC:   Target date: 02/01/2023  Pt will demonstrate proper lifting technique of 5# object w/no more than 4/10 pain for improved posterior chain strength and safety at home  Baseline: Hands braced on thighs, >8/10 pain  Goal status: MET  2.  Pt will ambulate greater than or equal to 1700 feet on with <5/10 RPE for improved cardiovascular endurance and BLE strength.  Baseline: 1583' no AD RPE of 8-10/10; 1596' outside no AD, RPE of 5/10  Goal status: NOT MET   NEW LONG TERM GOALS FOR UPDATED POC:  Target date: 03/01/2023  Pt will be independent with final HEP for improved strength, balance, transfers and gait. Baseline:  Goal status: IN PROGRESS  2.  Pt will lift 5# from [facility] shelf w/proper form and no more than 4/10 pain for improved body mechanics and reduced pain levels  Baseline:  Goal status: INITIAL    ASSESSMENT:  CLINICAL IMPRESSION: Emphasis of skilled PT session on functional hip strength, single leg stability and core stability. Pt requested to repeat blaze pod activity from last session as she enjoyed it and felt a good burn in her hips. Pt challenged by functional core stability and low back pain w/standing activities, so will continue to progress in therapy. Continue POC.    OBJECTIVE IMPAIRMENTS: Abnormal gait, decreased activity tolerance, decreased balance, decreased endurance, decreased mobility, difficulty walking, decreased ROM, decreased strength, impaired flexibility, impaired sensation, postural dysfunction, and pain  ACTIVITY LIMITATIONS:  lifting, standing, stairs, transfers, bed mobility, bathing, hygiene/grooming, locomotion level, and caring for others  PARTICIPATION LIMITATIONS: cleaning, laundry, shopping, community activity, and yard work  PERSONAL FACTORS: Age, Fitness, Past/current experiences, and 1-2 comorbidities: Lumbar stenosis, 3 brain aneurysms, kidney transplant in 2022  are also affecting patient's functional outcome.   REHAB POTENTIAL: Good  CLINICAL DECISION MAKING: Evolving/moderate complexity  EVALUATION COMPLEXITY: Moderate  PLAN:  PT FREQUENCY: 2x/week  PT DURATION: 6 weeks + 8 weeks (recert)  PLANNED INTERVENTIONS: 54098- PT Re-evaluation, 97110-Therapeutic exercises, 97530- Therapeutic activity, 97112- Neuromuscular re-education, 97535- Self Care, 11914- Manual therapy, [phone]- Gait training, [phone]- Orthotic Fit/training, [phone]- Aquatic Therapy, Patient/Family education, Balance training, Stair training, Dry Needling, Joint mobilization, Vestibular training, and DME instructions  PLAN FOR NEXT SESSION: Core stability. How is neck? Check BP. Add to HEP for improved spinal and R hip mobility, strength. Global endurance and functional strength (lifting), eccentric control, head turns, anticipatory balance, blaze pods, rebounder, rockerboard, scapular retraction, slam balls, scap push ups, upright rows, chin tucks    Ayriana Wix E Ellason Segar, PT, DPT 02/16/2023, 2:02 PM

## 2023-02-21 ENCOUNTER — Telehealth: Payer: Self-pay | Admitting: Physical Therapy

## 2023-02-21 ENCOUNTER — Ambulatory Visit: Payer: Medicare HMO | Admitting: Physical Therapy

## 2023-02-21 NOTE — Telephone Encounter (Signed)
Called and spoke to pt regarding no-show to today's scheduled PT appointment. Pt reports she did not sleep well last night and accidentally overslept but confirmed next appointment date and time.    Jill Alexanders Fallen Crisostomo, PT, DPT

## 2023-02-23 ENCOUNTER — Ambulatory Visit: Payer: Medicare HMO | Admitting: Physical Therapy

## 2023-02-28 ENCOUNTER — Ambulatory Visit: Payer: Medicare HMO | Admitting: Physical Therapy

## 2023-02-28 VITALS — BP 117/55 | HR 61

## 2023-02-28 DIAGNOSIS — M542 Cervicalgia: Secondary | ICD-10-CM

## 2023-02-28 DIAGNOSIS — M5431 Sciatica, right side: Secondary | ICD-10-CM

## 2023-02-28 DIAGNOSIS — M5459 Other low back pain: Secondary | ICD-10-CM | POA: Diagnosis not present

## 2023-02-28 DIAGNOSIS — R2689 Other abnormalities of gait and mobility: Secondary | ICD-10-CM

## 2023-02-28 DIAGNOSIS — M6281 Muscle weakness (generalized): Secondary | ICD-10-CM

## 2023-02-28 DIAGNOSIS — R2681 Unsteadiness on feet: Secondary | ICD-10-CM | POA: Diagnosis not present

## 2023-02-28 DIAGNOSIS — R296 Repeated falls: Secondary | ICD-10-CM | POA: Diagnosis not present

## 2023-02-28 NOTE — Therapy (Signed)
 OUTPATIENT PHYSICAL THERAPY NEURO TREATMENT   Patient Name: Cristina Wilson MRN: [id number] DOB:28-Jan-1950, 73 y.o., female Today's Date: 02/28/2023   PCP: Merlene Laughter, MD REFERRING PROVIDER: Joaquim Nam, MD  END OF SESSION:  PT End of Session - 02/28/23 1109     Visit Number 25    Number of Visits 29    Date for PT Re-Evaluation 03/01/23    Authorization Type [employer]    PT Start Time 1107   Previous pt session ran late   PT Stop Time 1141   Pt requesting to pause PT until sx   PT Time Calculation (min) 34 min    Equipment Utilized During Treatment --    Activity Tolerance Patient limited by pain    Behavior During Therapy [facility] for tasks assessed/performed                Past Medical History:  Diagnosis Date   Abdominal pain 06/06/2018   Acute renal failure (ARF) (HCC) 06/22/2018   Anemia in chronic kidney disease 08/25/2018   Anemia, unspecified 07/05/2018   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 states she has 2 aneurysms   Chronic low back pain 04/12/2017   Chronic pain syndrome 04/12/2017   Closed fracture of left distal radius    De Quervain's tenosynovitis 02/10/2018   Degeneration of lumbar intervertebral disc 03/01/2017   End stage renal failure on dialysis [facility])    M/W/F on [employer]   GERD (gastroesophageal reflux disease)    Goodpasture syndrome (HCC)    Headache 10/30/2014   Hyperlipidemia 04/17/2018   Hypertension    Hypothyroidism    Iron deficiency anemia, unspecified 07/12/2018   PONV (postoperative nausea and vomiting)    violent vomiting   Sacral back pain 05/25/2016   Scoliosis of lumbar spine 03/01/2017   Secondary hyperparathyroidism of renal origin (HCC) 08/22/2018   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 OR;  Service: Vascular;  Laterality: Left;   BILATERAL CARPAL TUNNEL RELEASE     BIOPSY  04/29/2019   Procedure: BIOPSY;  Surgeon: Kathi Der, MD;  Location: [facility] ENDOSCOPY;  Service: Gastroenterology;;   BIOPSY  11/05/2019   Procedure: BIOPSY;  Surgeon: Kathi Der, MD;  Location: [facility] ENDOSCOPY;  Service: Gastroenterology;;   BREAST SURGERY Left    lumpectomy   BUNIONECTOMY Right    CHOLECYSTECTOMY N/A 10/13/2016   Procedure: LAPAROSCOPIC CHOLECYSTECTOMY WITH INTRAOPERATIVE CHOLANGIOGRAM;  Surgeon: Manus Rudd, MD;  Location: [facility] OR;  Service: General;  Laterality: N/A;   COLONOSCOPY WITH PROPOFOL N/A 11/05/2019   Procedure: COLONOSCOPY WITH PROPOFOL;  Surgeon: Kathi Der, MD;  Location: [facility];  Service: Gastroenterology;  Laterality: N/A;   ESOPHAGOGASTRODUODENOSCOPY (EGD) WITH PROPOFOL N/A 04/29/2019   Procedure: ESOPHAGOGASTRODUODENOSCOPY (EGD) WITH PROPOFOL;  Surgeon: Kathi Der, MD;  Location: [facility];  Service: Gastroenterology;  Laterality: N/A;   EYE SURGERY     surgery for cross eye   FOOT  FRACTURE SURGERY     OPEN REDUCTION INTERNAL FIXATION (ORIF) DISTAL RADIAL FRACTURE Left 03/29/2017   Procedure: OPEN REDUCTION INTERNAL FIXATION (ORIF)LEFT  DISTAL RADIAL FRACTURE;  Surgeon: Betha Loa, MD;  Location: [facility];  Service: Orthopedics;  Laterality: Left;   POLYPECTOMY  11/05/2019   Procedure: POLYPECTOMY;  Surgeon: Kathi Der, MD;  Location: [facility];  Service: Gastroenterology;;   THYROIDECTOMY     TONSILLECTOMY     WISDOM TOOTH EXTRACTION     Patient Active Problem List    Diagnosis Date Noted   Hypertensive crisis 10/23/2019   History of cerebral aneurysm 10/23/2019   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   Hypoxemia 02/21/2019   Pulmonary edema 02/20/2019   Encounter for immunization 09/26/2018   Anemia in chronic kidney disease 08/25/2018   End stage renal disease (HCC) 08/25/2018   Secondary hyperparathyroidism of renal origin (HCC) 08/22/2018   Referred otalgia of left ear 08/16/2018   Iron deficiency anemia, unspecified 07/12/2018   Shortness of breath 07/06/2018   Anemia, unspecified 07/05/2018   Anxiety 07/05/2018   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   Acute renal failure (ARF) (HCC) 06/22/2018   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   Abdominal pain 06/06/2018   Hydronephrosis due to obstruction of ureteral orifice 06/06/2018   Disease of thyroid gland 04/17/2018   Hyperlipidemia 04/17/2018   Hypertension 04/17/2018   De Quervain's tenosynovitis 02/10/2018   Trigger finger of left thumb 02/10/2018   Chronic low back pain 04/12/2017   Chronic pain syndrome 04/12/2017   Degeneration of lumbar intervertebral disc 03/01/2017   Scoliosis of lumbar spine 03/01/2017   Sacral back pain 05/25/2016   Eye pain 10/30/2014    ONSET DATE: 11/10/2022 (referral)   REFERRING DIAG:  H47.425 (ICD-10-CM) - Spinal stenosis, lumbar region with neurogenic claudication    THERAPY DIAG:  Muscle weakness (generalized)  Other abnormalities of gait and mobility  Unsteadiness on feet  Cervicalgia  Other low back pain  Sciatica, right side  Rationale for Evaluation and Treatment: Rehabilitation  SUBJECTIVE:                                                                                                                                                                                             SUBJECTIVE STATEMENT: Patient reports doing okay. States some days all she can do is sit in her recliner due to pain.  Heard back from insurance that her neck surgery was approved. No falls   Pt accompanied by: self  PERTINENT HISTORY: hypothyroidism; HTN; HLD; Goodpasture syndrome; cerebral aneurysm; ESRD on TTS HD; and chronic pain, kidney replacement (12/14/2020).  PAIN:  Are you having pain? Yes: NPRS scale: 4-5 Pain location: RLE, low back and neck Pain description: achy, throbbing   PRECAUTIONS: Cervical and Fall  PATIENT GOALS: "To establish mobility so that I do not fall and I feel confident"   OBJECTIVE:  Note: Objective measures were completed at Evaluation unless otherwise noted.  DIAGNOSTIC FINDINGS: MRI of lumbar spine from 2022   Disc levels:   T12- L1: Unremarkable.   L1-L2: Ventral spondylitic spurring   L2-L3: Disc narrowing and bulging with mild right facet spurring.   L3-L4: Disc narrowing and bulging.  Mild facet spurring   L4-L5: Disc narrowing and bulging with left inferior foraminal protrusion. Asymmetric left facet spurring. Left foraminal stenosis is mild   L5-S1:Disc narrowing and bulging with endplate spurring eccentric to the left. Negative facets.   IMPRESSION: Generalized lumbar spine degeneration similar to 2017. No impingement to explain right leg symptoms.  VITALS Vitals:   02/28/23 1115  BP: (!) 117/55  Pulse: 61       TODAY'S TREATMENT:      Self-care Assessed vitals (see above) and WNL for session.   Ther Act  Supermans x6 reps and legs only x10 reps for improved posterior chain strength. Pt unable to tolerate well due to neck pain so halted activity.   Manual therapy  Supine sub-occipital release x10 minutes w/chin tucks. Pt reports no improvement in pain w/this.   Self-care (cont.)  Discussed POC moving forward and pt has hit plateau in progress due to worsening cervical pain and weakness. Pt in agreement with this and emphasized need to have cervical spine surgery in order to have pain relief. Pt and therapist in agreement to pause  PT for now and contact Dr. Verlee Rossetti office to inquire about surgery scheduling.    PATIENT EDUCATION: Education details: See self-care section  Person educated: Patient Education method: Explanation Education comprehension: verbalized understanding  HOME EXERCISE PROGRAM: Verbally added seated march overs on eval   Access Code: [email or web address] URL: [email or web address] Date: 11/23/2022 Prepared by: Alethia Berthold Joby Hershkowitz  Exercises - Seated Piriformis Stretch with Trunk Bend  - 1 x daily - 7 x weekly - 3 sets - 30-45 second  hold - Supine Figure 4 Piriformis Stretch  - 1 x daily - 7 x weekly - 3 sets - 45-60 second hold - Supine Straight Leg Lumbar Rotation Stretch  - 1 x daily - 7 x weekly - 1 sets - 5-10 reps - 10-20 second hold - Seated Hamstring Stretch  - 1 x daily - 7 x weekly - 3 sets - 10 reps - Supine Butterfly Groin Stretch  - 1 x daily - 7 x weekly - 3 sets - 10 reps - Forward Step Down with Heel Tap and Rail Support  - 1 x daily - 7 x weekly - 3 sets - 10 reps - Side Step Down with Counter Support  - 1 x daily - 7 x weekly - 3 sets - 10 reps - Brandt-Daroff Vestibular Exercise  - 1 x daily - 7 x weekly - 3 sets - 10 reps - ITB Stretch at Wall  - 1 x daily - 7 x weekly - 3 sets - 30-60 second hold - Supine Chin Tuck  - 1 x daily - 7 x weekly - 3 sets - 10 reps - 2-5 second hold - Chin Tuck  - 1 x daily - 7 x weekly - 3 sets - 10 reps - 2-5 second hold  -Verbally added march overs  in long-sit on 02/03/23   Access Code: [email or web address] URL: [email or web address] Date: 02/07/2023 Prepared by: Lavone Nian  Exercises - Hooklying Sacroiliac Joint Isometric  - 1 x daily - 7 x weekly - 10 reps - 10 sec hold - Bilateral Bent Leg Lift  - 1 x daily - 7 x weekly - 3 sets - 10 reps  GOALS: Goals reviewed with patient? Yes    NEW SHORT TERM GOALS FOR UPDATED POC:   Target date: 02/01/2023  Pt will demonstrate proper lifting technique of 5# object w/no more than  4/10 pain for improved posterior chain strength and safety at home  Baseline: Hands braced on thighs, >8/10 pain  Goal status: MET  2.  Pt will ambulate greater than or equal to 1700 feet on with <5/10 RPE for improved cardiovascular endurance and BLE strength.  Baseline: 1583' no AD RPE of 8-10/10; 1596' outside no AD, RPE of 5/10  Goal status: NOT MET   NEW LONG TERM GOALS FOR UPDATED POC:  Target date: 03/01/2023  Pt will be independent with final HEP for improved strength, balance, transfers and gait. Baseline:  Goal status: MET  2.  Pt will lift 5# from [facility] shelf w/proper form and no more than 4/10 pain for improved body mechanics and reduced pain levels  Baseline:  Goal status: NOT MET    ASSESSMENT:  CLINICAL IMPRESSION: Emphasis of skilled PT session on LTG assessment and discussing POC moving forward. Pt has met 1 of 2 LTGs, demonstrating independence w/HEP. Unable to assess lifting goal due to high pain levels this date. Pt in agreement to go on pause w/PT at this time as she needs cervical spine surgery and will attempt to contact Dr. Verlee Rossetti office to inform him of sharp decline in pt's function due to neck pain that prevents pt from sleeping and her ADLs. Pt provided w/therapist's email in case she has questions or updates within the next 60 days and understands she will need new referral if she does not return by then.    OBJECTIVE IMPAIRMENTS: Abnormal gait, decreased activity tolerance, decreased balance, decreased endurance, decreased mobility, difficulty walking, decreased ROM, decreased strength, impaired flexibility, impaired sensation, postural dysfunction, and pain  ACTIVITY LIMITATIONS: lifting, standing, stairs, transfers, bed mobility, bathing, hygiene/grooming, locomotion level, and caring for others  PARTICIPATION LIMITATIONS: cleaning, laundry, shopping, community activity, and yard work  PERSONAL FACTORS: Age, Fitness, Past/current experiences, and  1-2 comorbidities: Lumbar stenosis, 3 brain aneurysms, kidney transplant in 2022  are also affecting patient's functional outcome.   REHAB POTENTIAL: Good  CLINICAL DECISION MAKING: Evolving/moderate complexity  EVALUATION COMPLEXITY: Moderate  PLAN:  PT FREQUENCY: 2x/week  PT DURATION: 6 weeks + 8 weeks (recert)  PLANNED INTERVENTIONS: 95621- PT Re-evaluation, 97110-Therapeutic exercises, 97530- Therapeutic activity, 97112- Neuromuscular re-education, 97535- Self Care, [id number]- Manual therapy, [phone]- Gait training, [phone]- Orthotic Fit/training, [phone]- Aquatic Therapy, Patient/Family education, Balance training, Stair training, Dry Needling, Joint mobilization, Vestibular training, and DME instructions  PLAN FOR NEXT SESSION: Core stability. How is neck? Check BP. Add to HEP for improved spinal and R hip mobility, strength. Global endurance and functional strength (lifting), eccentric control, head turns, anticipatory balance, blaze pods, rebounder, rockerboard, scapular retraction, slam balls, scap push ups, upright rows, chin tucks    Vincen Bejar E Venisha Boehning, PT, DPT 02/28/2023, 11:42 AM

## 2023-03-02 ENCOUNTER — Ambulatory Visit: Payer: Medicare HMO | Admitting: Physical Therapy

## 2023-03-02 DIAGNOSIS — T50905A Adverse effect of unspecified drugs, medicaments and biological substances, initial encounter: Secondary | ICD-10-CM | POA: Diagnosis not present

## 2023-03-02 DIAGNOSIS — D040 Carcinoma in situ of skin of lip: Secondary | ICD-10-CM | POA: Diagnosis not present

## 2023-03-02 DIAGNOSIS — C44319 Basal cell carcinoma of skin of other parts of face: Secondary | ICD-10-CM | POA: Diagnosis not present

## 2023-03-02 DIAGNOSIS — Z85828 Personal history of other malignant neoplasm of skin: Secondary | ICD-10-CM | POA: Diagnosis not present

## 2023-03-02 DIAGNOSIS — Z86018 Personal history of other benign neoplasm: Secondary | ICD-10-CM | POA: Diagnosis not present

## 2023-03-02 DIAGNOSIS — D229 Melanocytic nevi, unspecified: Secondary | ICD-10-CM | POA: Diagnosis not present

## 2023-03-02 DIAGNOSIS — L570 Actinic keratosis: Secondary | ICD-10-CM | POA: Diagnosis not present

## 2023-03-02 DIAGNOSIS — D485 Neoplasm of uncertain behavior of skin: Secondary | ICD-10-CM | POA: Diagnosis not present

## 2023-03-08 DIAGNOSIS — G959 Disease of spinal cord, unspecified: Secondary | ICD-10-CM | POA: Diagnosis not present

## 2023-03-08 DIAGNOSIS — M5001 Cervical disc disorder with myelopathy,  high cervical region: Secondary | ICD-10-CM | POA: Diagnosis not present

## 2023-03-08 DIAGNOSIS — M50021 Cervical disc disorder at C4-C5 level with myelopathy: Secondary | ICD-10-CM | POA: Diagnosis not present

## 2023-03-25 DIAGNOSIS — R0781 Pleurodynia: Secondary | ICD-10-CM | POA: Diagnosis not present

## 2023-03-30 DIAGNOSIS — G959 Disease of spinal cord, unspecified: Secondary | ICD-10-CM | POA: Diagnosis not present

## 2023-04-01 ENCOUNTER — Other Ambulatory Visit (HOSPITAL_COMMUNITY): Payer: Self-pay | Admitting: Internal Medicine

## 2023-04-01 DIAGNOSIS — Z9181 History of falling: Secondary | ICD-10-CM

## 2023-04-02 ENCOUNTER — Ambulatory Visit (HOSPITAL_BASED_OUTPATIENT_CLINIC_OR_DEPARTMENT_OTHER)
Admission: RE | Admit: 2023-04-02 | Discharge: 2023-04-02 | Disposition: A | Discharge status: Home / Self Care | Source: Ambulatory Visit | Attending: Internal Medicine | Admitting: Internal Medicine

## 2023-04-02 DIAGNOSIS — W19XXXA Unspecified fall, initial encounter: Secondary | ICD-10-CM | POA: Diagnosis not present

## 2023-04-02 DIAGNOSIS — Z9181 History of falling: Secondary | ICD-10-CM | POA: Diagnosis not present

## 2023-04-02 DIAGNOSIS — S2241XA Multiple fractures of ribs, right side, initial encounter for closed fracture: Secondary | ICD-10-CM | POA: Diagnosis not present

## 2023-04-02 DIAGNOSIS — R0781 Pleurodynia: Secondary | ICD-10-CM | POA: Diagnosis not present

## 2023-04-02 DIAGNOSIS — I700 Atherosclerosis of aorta: Secondary | ICD-10-CM | POA: Diagnosis not present

## 2023-04-06 DIAGNOSIS — R296 Repeated falls: Secondary | ICD-10-CM | POA: Diagnosis not present

## 2023-04-06 DIAGNOSIS — M810 Age-related osteoporosis without current pathological fracture: Secondary | ICD-10-CM | POA: Diagnosis not present

## 2023-04-06 DIAGNOSIS — S2241XD Multiple fractures of ribs, right side, subsequent encounter for fracture with routine healing: Secondary | ICD-10-CM | POA: Diagnosis not present

## 2023-04-12 DIAGNOSIS — C44319 Basal cell carcinoma of skin of other parts of face: Secondary | ICD-10-CM | POA: Diagnosis not present

## 2023-04-18 ENCOUNTER — Ambulatory Visit: Attending: Vascular Surgery | Admitting: Physical Therapy

## 2023-04-18 ENCOUNTER — Encounter: Payer: Self-pay | Admitting: Physical Therapy

## 2023-04-18 DIAGNOSIS — M542 Cervicalgia: Secondary | ICD-10-CM | POA: Diagnosis present

## 2023-04-18 DIAGNOSIS — Z9181 History of falling: Secondary | ICD-10-CM | POA: Insufficient documentation

## 2023-04-18 DIAGNOSIS — M5431 Sciatica, right side: Secondary | ICD-10-CM | POA: Diagnosis present

## 2023-04-18 DIAGNOSIS — R2689 Other abnormalities of gait and mobility: Secondary | ICD-10-CM | POA: Insufficient documentation

## 2023-04-18 DIAGNOSIS — M6281 Muscle weakness (generalized): Secondary | ICD-10-CM | POA: Insufficient documentation

## 2023-04-18 DIAGNOSIS — R296 Repeated falls: Secondary | ICD-10-CM | POA: Diagnosis present

## 2023-04-18 DIAGNOSIS — R2681 Unsteadiness on feet: Secondary | ICD-10-CM | POA: Diagnosis present

## 2023-04-18 NOTE — Therapy (Signed)
 OUTPATIENT PHYSICAL THERAPY NEURO EVALUATION   Patient Name: Cristina Wilson MRN: [id number] DOB:1950-03-21, 73 y.o., female Today's Date: 04/18/2023   PCP: Alvie Ax, MD REFERRING PROVIDER: Ginnie Laine, MD  END OF SESSION:  PT End of Session - 04/18/23 1236     Visit Number 1    Number of Visits 13    Date for PT Re-Evaluation 06/06/23    Authorization Type [employer]    PT Start Time 1233    PT Stop Time 1306    PT Time Calculation (min) 33 min    Equipment Utilized During Treatment Gait belt    Activity Tolerance Patient tolerated treatment well;Patient limited by pain    Behavior During Therapy WFL for tasks assessed/performed             Past Medical History:  Diagnosis Date   Abdominal pain 06/06/2018   Acute renal failure (ARF) (HCC) 06/22/2018   Anemia in chronic kidney disease 08/25/2018   Anemia, unspecified 07/05/2018   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 states she has 2 aneurysms   Chronic low back pain 04/12/2017   Chronic pain syndrome 04/12/2017   Closed fracture of left distal radius    De Quervain's tenosynovitis 02/10/2018   Degeneration of lumbar intervertebral disc 03/01/2017   End stage renal failure on dialysis [facility])    M/W/F on [employer]   GERD (gastroesophageal reflux disease)    Goodpasture syndrome (HCC)    Headache 10/30/2014   Hyperlipidemia 04/17/2018   Hypertension    Hypothyroidism    Iron deficiency anemia, unspecified 07/12/2018   PONV (postoperative nausea and vomiting)    violent vomiting   Sacral back pain 05/25/2016   Scoliosis of lumbar spine 03/01/2017   Secondary hyperparathyroidism of renal origin (HCC) 08/22/2018   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   Thyroid disease    Trigger finger of left thumb 02/10/2018   Past Surgical History:  Procedure Laterality Date   ARTERY  BIOPSY Right 10/31/2014   Procedure: BIOPSY TEMPORAL ARTERY-RIGHT;  Surgeon: Palma Bob, MD;  Location: [facility] OR;  Service: Vascular;  Laterality: Right;   AV FISTULA PLACEMENT Left 10/11/2018   Procedure: left arm ARTERIOVENOUS (AV) FISTULA  creation;  Surgeon: Adine Hoof, MD;  Location: [facility] OR;  Service: Vascular;  Laterality: Left;   BILATERAL CARPAL TUNNEL RELEASE     BIOPSY  04/29/2019   Procedure: BIOPSY;  Surgeon: Felecia Hopper, MD;  Location: [facility];  Service: Gastroenterology;;   BIOPSY  11/05/2019   Procedure: BIOPSY;  Surgeon: Felecia Hopper, MD;  Location: [facility];  Service: Gastroenterology;;   BREAST SURGERY Left    lumpectomy   BUNIONECTOMY Right    CHOLECYSTECTOMY N/A 10/13/2016   Procedure: LAPAROSCOPIC CHOLECYSTECTOMY WITH INTRAOPERATIVE CHOLANGIOGRAM;  Surgeon: Dareen Ebbing, MD;  Location: [facility] OR;  Service: General;  Laterality: N/A;   COLONOSCOPY WITH PROPOFOL N/A 11/05/2019   Procedure: COLONOSCOPY WITH PROPOFOL;  Surgeon: Felecia Hopper, MD;  Location: [facility];  Service: Gastroenterology;  Laterality: N/A;   ESOPHAGOGASTRODUODENOSCOPY (EGD) WITH PROPOFOL N/A 04/29/2019   Procedure: ESOPHAGOGASTRODUODENOSCOPY (EGD) WITH PROPOFOL;  Surgeon: Felecia Hopper, MD;  Location: [facility];  Service: Gastroenterology;  Laterality: N/A;   EYE SURGERY     surgery for cross eye   FOOT FRACTURE SURGERY     OPEN REDUCTION INTERNAL FIXATION (ORIF) DISTAL RADIAL  FRACTURE Left 03/29/2017   Procedure: OPEN REDUCTION INTERNAL FIXATION (ORIF)LEFT  DISTAL RADIAL FRACTURE;  Surgeon: Brunilda Capra, MD;  Location: [facility];  Service: Orthopedics;  Laterality: Left;   POLYPECTOMY  11/05/2019   Procedure: POLYPECTOMY;  Surgeon: Felecia Hopper, MD;  Location: [facility];  Service: Gastroenterology;;   THYROIDECTOMY     TONSILLECTOMY     WISDOM TOOTH EXTRACTION     Patient Active Problem List   Diagnosis Date Noted   Hypertensive  crisis 10/23/2019   History of cerebral aneurysm 10/23/2019   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   Hypoxemia 02/21/2019   Pulmonary edema 02/20/2019   Encounter for immunization 09/26/2018   Anemia in chronic kidney disease 08/25/2018   End stage renal disease (HCC) 08/25/2018   Secondary hyperparathyroidism of renal origin (HCC) 08/22/2018   Referred otalgia of left ear 08/16/2018   Iron deficiency anemia, unspecified 07/12/2018   Shortness of breath 07/06/2018   Anemia, unspecified 07/05/2018   Anxiety 07/05/2018   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   Acute renal failure (ARF) (HCC) 06/22/2018   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   Abdominal pain 06/06/2018   Hydronephrosis due to obstruction of ureteral orifice 06/06/2018   Disease of thyroid gland 04/17/2018   Hyperlipidemia 04/17/2018   Hypertension 04/17/2018   De Quervain's tenosynovitis 02/10/2018   Trigger finger of left thumb 02/10/2018   Chronic low back pain 04/12/2017   Chronic pain syndrome 04/12/2017   Degeneration of lumbar intervertebral disc 03/01/2017   Scoliosis of lumbar spine 03/01/2017   Sacral back pain 05/25/2016   Eye pain 10/30/2014    ONSET DATE: 04/11/2023 (referral)  REFERRING DIAG: R29.6 (ICD-10-CM) - Repeated falls  THERAPY DIAG:  Unsteadiness on feet  Other abnormalities of gait and mobility  Repeated falls  Sciatica, right side  Muscle weakness (generalized)  Cervicalgia  Rationale for Evaluation and Treatment: Rehabilitation  SUBJECTIVE:                                                                                                                                                                                             SUBJECTIVE STATEMENT: Pt, who is well known to this therapist, presents w/o AD following her ACDF on 3/4. Has had 3 falls since surgery, one in which she broke two ribs on the right side. Feels as though she  frequently falls over her R foot. Pain in her neck has been much better but her R sciatica has been worse. Unable to tolerate her HEP due to rib pain.   Pt accompanied by: self  PERTINENT HISTORY: hypothyroidism; HTN; HLD; Goodpasture syndrome;  cerebral aneurysm; ESRD on TTS HD; chronic pain; ACDF on 03/08/23  PAIN:  Are you having pain? Yes: NPRS scale: 6-7/10 Pain location: RLE  Pain description: Sciatica  Aggravating factors: Sitting for too long  Relieving factors: Tramadol  PRECAUTIONS: Cervical, Fall, and Other: 2 broken ribs on R side   (follows up w Dr. Danielle Dess on 5/2)   RED FLAGS: Cervical red flags: Dysphagia No, Dysmetria No, Diplopia No, Nystagmus No, and Nausea No   WEIGHT BEARING RESTRICTIONS: No  FALLS: Has patient fallen in last 6 months? Yes. Number of falls 3  LIVING ENVIRONMENT: Lives with: lives alone Lives in: House/apartment Stairs: Yes: External: 2 in back, 4-5 in front steps; No rails in back, banister in front Has following equipment at home: Single point cane and Walker - 4 wheeled  PLOF: Independent  PATIENT GOALS: "I don't wanna walk like I feel like I am gonna fall and wobble wobble"   OBJECTIVE:  Note: Objective measures were completed at Evaluation unless otherwise noted.  DIAGNOSTIC FINDINGS: CT of chest from 04/02/23  IMPRESSION: Healing posterior right ninth and tenth rib fractures which are nondisplaced. No hemothorax or pneumothorax.  COGNITION: Overall cognitive status: Within functional limits for tasks assessed   SENSATION: Impaired sensation in RUE and sciatica in RLE   POSTURE: rounded shoulders, forward head, and increased thoracic kyphosis  LOWER EXTREMITY ROM:     Active  Right Eval Left Eval  Hip flexion    Hip extension    Hip abduction    Hip adduction    Hip internal rotation    Hip external rotation    Knee flexion    Knee extension    Ankle dorsiflexion    Ankle plantarflexion    Ankle inversion    Ankle  eversion     (Blank rows = not tested)  LOWER EXTREMITY MMT:  Tested in seated position   MMT Right Eval Left Eval  Hip flexion 4- 4  Hip extension    Hip abduction 4+ 4+  Hip adduction 4+ 4+  Hip internal rotation    Hip external rotation    Knee flexion 4+ 4+  Knee extension 4+ 4+  Ankle dorsiflexion 4 4  Ankle plantarflexion    Ankle inversion    Ankle eversion    (Blank rows = not tested)  BED MOBILITY:  Not tested Pt reports pain w/this due to rib pain   TRANSFERS: Sit to stand: SBA  Assistive device utilized: None     Stand to sit: SBA  Assistive device utilized: None       GAIT Gait pattern: step through pattern, decreased hip/knee flexion- Right, decreased ankle dorsiflexion- Right, lateral hip instability, wide BOS, and poor foot clearance- Right Distance walked: Various clinic distances  Assistive device utilized: None Level of assistance: SBA Comments: Note decreased step clearance and DF of RLE but not a true foot drop.    FUNCTIONAL TESTS:   [facility] PT Assessment - 04/18/23 1251       Transfers   Five time sit to stand comments  19.15s   Wide BOS, no UE support     Ambulation/Gait   Gait velocity 32.8' over 14.31s = 2.29 ft/s no AD              PATIENT SURVEYS:  ABC scale 47.5%  TREATMENT:   Self-care/home management  Educated and demonstrated pt on foot-up brace and Bioness L300 Go to work on reduced R foot drop w/gait, as this is what pt feels most unsteady with. Pt agreeable to try both in future sessions   PATIENT EDUCATION: Education details: POC, eval findings  Person educated: Patient Education method: Medical illustrator Education comprehension: verbalized understanding and needs further education  HOME EXERCISE PROGRAM: From previous POC:  Access Code: [email or web address]   GOALS: Goals reviewed with  patient? Yes  SHORT TERM GOALS: Target date: 05/16/2023   Pt will improve 5 x STS to less than or equal to 16 seconds w/hip-width BOS to demonstrate improved functional strength and transfer efficiency.   Baseline: 19.15s w/wide BOS and no UE support  Goal status: INITIAL  2.  FGA to be assessed and LTG updated  Baseline:  Goal status: INITIAL  3.  Pt will improve gait velocity to at least 3.1 ft/s for improved gait efficiency and independence   Baseline:  Goal status: INITIAL   LONG TERM GOALS: Target date: 05/30/2023   FGA goal  Baseline:  Goal status: INITIAL  2.  Pt will score >/= 60% on ABC scale for improved self-confidence and independence  Baseline: 47.5%  Goal status: INITIAL  3.  Pt will improve 5 x STS to less than or equal to 12 seconds w/proper body mechanics to demonstrate improved functional strength and transfer efficiency.   Baseline: 19.15s w/wide BOS  Goal status: INITIAL  4.  Pt will improve gait velocity to at least 3.3 ft/s for improved gait efficiency and independence   Baseline:  Goal status: INITIAL  5.  Pt will perform floor transfer w/SBA for improved safety at home and fall recovery  Baseline:  Goal status: INITIAL   ASSESSMENT:  CLINICAL IMPRESSION: Patient is a 73 year old female referred to Neuro OPPT for falls. Pt's PMH is significant for: hypothyroidism; HTN; HLD; Goodpasture syndrome; cerebral aneurysm; ESRD on TTS HD; chronic pain and ACDF on 03/07/33.The following deficits were present during the exam: decreased functional strength, impaired sensation, decreased mobility 2/2 rib fx and impaired balance. Based on fall history and decreased strength, pt is an incr risk for falls. Pt would benefit from skilled PT to address these impairments and functional limitations to maximize functional mobility independence.    OBJECTIVE IMPAIRMENTS: Abnormal gait, decreased activity tolerance, decreased balance, decreased coordination, decreased  endurance, difficulty walking, decreased strength, dizziness, impaired sensation, improper body mechanics, and pain   ACTIVITY LIMITATIONS: carrying, lifting, squatting, sleeping, stairs, transfers, bed mobility, reach over head, and locomotion level  PARTICIPATION LIMITATIONS: cleaning, laundry, driving, shopping, community activity, and yard work  PERSONAL FACTORS: Past/current experiences and 1-2 comorbidities: Recent ACDF and chronic R sided sciatica  are also affecting patient's functional outcome.   REHAB POTENTIAL: Good  CLINICAL DECISION MAKING: Evolving/moderate complexity  EVALUATION COMPLEXITY: Moderate  PLAN:  PT FREQUENCY: 2x/week  PT DURATION: 6 weeks  PLANNED INTERVENTIONS: 97164- PT Re-evaluation, 97750- Physical Performance Testing, 97110-Therapeutic exercises, 97530- Therapeutic activity, 97112- Neuromuscular re-education, 97535- Self Care, 54098- Manual therapy, [phone]- Gait training, [phone]- Electrical stimulation (manual), Patient/Family education, Balance training, Stair training, Dry Needling, Joint mobilization, Spinal mobilization, Scar mobilization, Vestibular training, and DME instructions  PLAN FOR NEXT SESSION: Review previous HEP and update prn. Monitor vitals. FGA and update goal. Trial foot-up/Bioness on RLE.    Jill Alexanders Anesha Hackert, PT, DPT 04/18/2023, 1:12 PM

## 2023-04-25 DIAGNOSIS — Z5181 Encounter for therapeutic drug level monitoring: Secondary | ICD-10-CM | POA: Diagnosis not present

## 2023-04-25 DIAGNOSIS — Z940 Kidney transplant status: Secondary | ICD-10-CM | POA: Diagnosis not present

## 2023-04-27 ENCOUNTER — Ambulatory Visit: Admitting: Physical Therapy

## 2023-04-27 VITALS — BP 100/72 | HR 60

## 2023-04-27 DIAGNOSIS — R296 Repeated falls: Secondary | ICD-10-CM

## 2023-04-27 DIAGNOSIS — R2681 Unsteadiness on feet: Secondary | ICD-10-CM

## 2023-04-27 DIAGNOSIS — R2689 Other abnormalities of gait and mobility: Secondary | ICD-10-CM

## 2023-04-27 DIAGNOSIS — M6281 Muscle weakness (generalized): Secondary | ICD-10-CM

## 2023-04-27 NOTE — Therapy (Signed)
 OUTPATIENT PHYSICAL THERAPY NEURO TREATMENT   Patient Name: Dondrea Clendenin MRN: [id number] DOB:02-Dec-1950, 73 y.o., female Today's Date: 04/27/2023   PCP: Alvie Ax, MD REFERRING PROVIDER: Ginnie Laine, MD  END OF SESSION:  PT End of Session - 04/27/23 0935     Visit Number 2    Number of Visits 13    Date for PT Re-Evaluation 06/06/23    Authorization Type [employer]    PT Start Time 0932    PT Stop Time 1012    PT Time Calculation (min) 40 min    Equipment Utilized During Treatment --   Foot-up brace   Activity Tolerance Patient limited by fatigue    Behavior During Therapy [facility] for tasks assessed/performed             Past Medical History:  Diagnosis Date   Abdominal pain 06/06/2018   Acute renal failure (ARF) (HCC) 06/22/2018   Anemia in chronic kidney disease 08/25/2018   Anemia, unspecified 07/05/2018   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 states she has 2 aneurysms   Chronic low back pain 04/12/2017   Chronic pain syndrome 04/12/2017   Closed fracture of left distal radius    De Quervain's tenosynovitis 02/10/2018   Degeneration of lumbar intervertebral disc 03/01/2017   End stage renal failure on dialysis [facility])    M/W/F on [employer]   GERD (gastroesophageal reflux disease)    Goodpasture syndrome (HCC)    Headache 10/30/2014   Hyperlipidemia 04/17/2018   Hypertension    Hypothyroidism    Iron deficiency anemia, unspecified 07/12/2018   PONV (postoperative nausea and vomiting)    violent vomiting   Sacral back pain 05/25/2016   Scoliosis of lumbar spine 03/01/2017   Secondary hyperparathyroidism of renal origin (HCC) 08/22/2018   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   Thyroid  disease    Trigger finger of left thumb 02/10/2018   Past Surgical History:  Procedure Laterality Date   ARTERY BIOPSY Right 10/31/2014    Procedure: BIOPSY TEMPORAL ARTERY-RIGHT;  Surgeon: Palma Bob, MD;  Location: [facility] OR;  Service: Vascular;  Laterality: Right;   AV FISTULA PLACEMENT Left 10/11/2018   Procedure: left arm ARTERIOVENOUS (AV) FISTULA  creation;  Surgeon: Adine Hoof, MD;  Location: [facility] OR;  Service: Vascular;  Laterality: Left;   BILATERAL CARPAL TUNNEL RELEASE     BIOPSY  04/29/2019   Procedure: BIOPSY;  Surgeon: Felecia Hopper, MD;  Location: [facility];  Service: Gastroenterology;;   BIOPSY  11/05/2019   Procedure: BIOPSY;  Surgeon: Felecia Hopper, MD;  Location: [facility];  Service: Gastroenterology;;   BREAST SURGERY Left    lumpectomy   BUNIONECTOMY Right    CHOLECYSTECTOMY N/A 10/13/2016   Procedure: LAPAROSCOPIC CHOLECYSTECTOMY WITH INTRAOPERATIVE CHOLANGIOGRAM;  Surgeon: Dareen Ebbing, MD;  Location: [facility] OR;  Service: General;  Laterality: N/A;   COLONOSCOPY WITH PROPOFOL  N/A 11/05/2019   Procedure: COLONOSCOPY WITH PROPOFOL ;  Surgeon: Felecia Hopper, MD;  Location: [facility];  Service: Gastroenterology;  Laterality: N/A;   ESOPHAGOGASTRODUODENOSCOPY (EGD) WITH PROPOFOL  N/A 04/29/2019   Procedure: ESOPHAGOGASTRODUODENOSCOPY (EGD) WITH PROPOFOL ;  Surgeon: Felecia Hopper, MD;  Location: [facility];  Service: Gastroenterology;  Laterality: N/A;   EYE SURGERY     surgery for cross eye   FOOT FRACTURE SURGERY     OPEN REDUCTION INTERNAL FIXATION (ORIF) DISTAL RADIAL FRACTURE  Left 03/29/2017   Procedure: OPEN REDUCTION INTERNAL FIXATION (ORIF)LEFT  DISTAL RADIAL FRACTURE;  Surgeon: Brunilda Capra, MD;  Location: [facility];  Service: Orthopedics;  Laterality: Left;   POLYPECTOMY  11/05/2019   Procedure: POLYPECTOMY;  Surgeon: Felecia Hopper, MD;  Location: [facility];  Service: Gastroenterology;;   THYROIDECTOMY     TONSILLECTOMY     WISDOM TOOTH EXTRACTION     Patient Active Problem List   Diagnosis Date Noted   Hypertensive crisis 10/23/2019    History of cerebral aneurysm 10/23/2019   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   Hypoxemia 02/21/2019   Pulmonary edema 02/20/2019   Encounter for immunization 09/26/2018   Anemia in chronic kidney disease 08/25/2018   End stage renal disease (HCC) 08/25/2018   Secondary hyperparathyroidism of renal origin (HCC) 08/22/2018   Referred otalgia of left ear 08/16/2018   Iron deficiency anemia, unspecified 07/12/2018   Shortness of breath 07/06/2018   Anemia, unspecified 07/05/2018   Anxiety 07/05/2018   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   Acute renal failure (ARF) (HCC) 06/22/2018   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   Abdominal pain 06/06/2018   Hydronephrosis due to obstruction of ureteral orifice 06/06/2018   Disease of thyroid  gland 04/17/2018   Hyperlipidemia 04/17/2018   Hypertension 04/17/2018   De Quervain's tenosynovitis 02/10/2018   Trigger finger of left thumb 02/10/2018   Chronic low back pain 04/12/2017   Chronic pain syndrome 04/12/2017   Degeneration of lumbar intervertebral disc 03/01/2017   Scoliosis of lumbar spine 03/01/2017   Sacral back pain 05/25/2016   Eye pain 10/30/2014    ONSET DATE: 04/11/2023 (referral)  REFERRING DIAG: R29.6 (ICD-10-CM) - Repeated falls  THERAPY DIAG:  Unsteadiness on feet  Other abnormalities of gait and mobility  Repeated falls  Muscle weakness (generalized)  Rationale for Evaluation and Treatment: Rehabilitation  SUBJECTIVE:                                                                                                                                                                                             SUBJECTIVE STATEMENT: Pt received wandering outside in parking lot. Tripped over RLE a few times when walking into clinic. Reports she has had one fall since last session, was outside and tripped in grass. Tried to get up and fell back again. After several minutes, was  able to get up. Did not have her walker. States she has a bruise on her posterior R thigh. States she had labs done and her creatinine is high and her blood levels are low. Is feeling very fatigued   Pt accompanied by:  self  PERTINENT HISTORY: hypothyroidism; HTN; HLD; Goodpasture syndrome; cerebral aneurysm; ESRD on TTS HD; chronic pain; ACDF on 03/08/23  PAIN:  Are you having pain? Yes: NPRS scale: 6-7/10 Pain location: RLE  Pain description: Sciatica  Aggravating factors: Sitting for too long  Relieving factors: Tramadol   PRECAUTIONS: Cervical, Fall, and Other: 2 broken ribs on R side   (follows up w Dr. Ellery Guthrie on 5/2)   RED FLAGS: Cervical red flags: Dysphagia No, Dysmetria No, Diplopia No, Nystagmus No, and Nausea No   WEIGHT BEARING RESTRICTIONS: No  FALLS: Has patient fallen in last 6 months? Yes. Number of falls 3  LIVING ENVIRONMENT: Lives with: lives alone Lives in: House/apartment Stairs: Yes: External: 2 in back, 4-5 in front steps; No rails in back, banister in front Has following equipment at home: Single point cane and Walker - 4 wheeled  PLOF: Independent  PATIENT GOALS: "I don't wanna walk like I feel like I am gonna fall and wobble wobble"   OBJECTIVE:  Note: Objective measures were completed at Evaluation unless otherwise noted.  DIAGNOSTIC FINDINGS: CT of chest from 04/02/23  IMPRESSION: Healing posterior right ninth and tenth rib fractures which are nondisplaced. No hemothorax or pneumothorax.  COGNITION: Overall cognitive status: Within functional limits for tasks assessed   SENSATION: Impaired sensation in RUE and sciatica in RLE   POSTURE: rounded shoulders, forward head, and increased thoracic kyphosis  LOWER EXTREMITY ROM:     Active  Right Eval Left Eval  Hip flexion    Hip extension    Hip abduction    Hip adduction    Hip internal rotation    Hip external rotation    Knee flexion    Knee extension    Ankle dorsiflexion    Ankle  plantarflexion    Ankle inversion    Ankle eversion     (Blank rows = not tested)  LOWER EXTREMITY MMT:  Tested in seated position   MMT Right Eval Left Eval  Hip flexion 4- 4  Hip extension    Hip abduction 4+ 4+  Hip adduction 4+ 4+  Hip internal rotation    Hip external rotation    Knee flexion 4+ 4+  Knee extension 4+ 4+  Ankle dorsiflexion 4 4  Ankle plantarflexion    Ankle inversion    Ankle eversion    (Blank rows = not tested)  BED MOBILITY:  Not tested Pt reports pain w/this due to rib pain   TRANSFERS: Sit to stand: SBA  Assistive device utilized: None     Stand to sit: SBA  Assistive device utilized: None       GAIT Gait pattern: step through pattern, decreased hip/knee flexion- Right, decreased ankle dorsiflexion- Right, lateral hip instability, wide BOS, and poor foot clearance- Right Distance walked: Various clinic distances  Assistive device utilized: None Level of assistance: SBA Comments: Note decreased step clearance and DF of RLE but not a true foot drop.    PATIENT SURVEYS:  ABC scale 47.5%   VITALS  Vitals:   04/27/23 0948  BP: 100/72  Pulse: 60  TREATMENT:   Ther Act  Reviewed recent labs and red blood cells and hematocrit are low. Pt reports she is experiencing SOB w/activity, so closely monitored SpO2 throughout session but maintained >98%. Encouraged pt to take it easy to avoid further falls, pt in agreement.  Informed pt to bring her rollator outisde with her if she is walking on unlevel terrain. Pt verbalized understanding  Trialed L foot-up brace (size large) and ambulated around clinic, outside in grass, up/down curb and on incline/declines. Pt reported feeling much more stable w/use of brace and requested therapist ordered this for her on [employer] with pt's phone, which therapist did. Educated pt on how to don  brace and pt struggled to fasten it independently, but was able to do so. Pt should have her own brace by Friday.   PATIENT EDUCATION: Education details: Foot-up brace, using walker outside  Person educated: Patient Education method: Medical illustrator Education comprehension: verbalized understanding and needs further education  HOME EXERCISE PROGRAM: From previous POC:  Access Code: [email or web address]   GOALS: Goals reviewed with patient? Yes  SHORT TERM GOALS: Target date: 05/16/2023   Pt will improve 5 x STS to less than or equal to 16 seconds w/hip-width BOS to demonstrate improved functional strength and transfer efficiency.   Baseline: 19.15s w/wide BOS and no UE support  Goal status: INITIAL  2.  FGA to be assessed and LTG updated  Baseline:  Goal status: INITIAL  3.  Pt will improve gait velocity to at least 3.1 ft/s for improved gait efficiency and independence   Baseline:  Goal status: INITIAL   LONG TERM GOALS: Target date: 05/30/2023   FGA goal  Baseline:  Goal status: INITIAL  2.  Pt will score >/= 60% on ABC scale for improved self-confidence and independence  Baseline: 47.5%  Goal status: INITIAL  3.  Pt will improve 5 x STS to less than or equal to 12 seconds w/proper body mechanics to demonstrate improved functional strength and transfer efficiency.   Baseline: 19.15s w/wide BOS  Goal status: INITIAL  4.  Pt will improve gait velocity to at least 3.3 ft/s for improved gait efficiency and independence   Baseline:  Goal status: INITIAL  5.  Pt will perform floor transfer w/SBA for improved safety at home and fall recovery  Baseline:  Goal status: INITIAL   ASSESSMENT:  CLINICAL IMPRESSION: Session limited due to high levels of fatigue and correlating abnormal lab values. Pt has had one fall since last visit, fell outside in backyard and then fell again when trying to stand up. Informed pt she needs to use walker outside on unlevel terrain  and ordered a foot-up brace for pt, as she demonstrates improved step clearance w/this. Continue POC.    OBJECTIVE IMPAIRMENTS: Abnormal gait, decreased activity tolerance, decreased balance, decreased coordination, decreased endurance, difficulty walking, decreased strength, dizziness, impaired sensation, improper body mechanics, and pain   ACTIVITY LIMITATIONS: carrying, lifting, squatting, sleeping, stairs, transfers, bed mobility, reach over head, and locomotion level  PARTICIPATION LIMITATIONS: cleaning, laundry, driving, shopping, community activity, and yard work  PERSONAL FACTORS: Past/current experiences and 1-2 comorbidities: Recent ACDF and chronic R sided sciatica  are also affecting patient's functional outcome.   REHAB POTENTIAL: Good  CLINICAL DECISION MAKING: Evolving/moderate complexity  EVALUATION COMPLEXITY: Moderate  PLAN:  PT FREQUENCY: 2x/week  PT DURATION: 6 weeks  PLANNED INTERVENTIONS: 97164- PT Re-evaluation, 97750- Physical Performance Testing, 97110-Therapeutic exercises, 97530- Therapeutic activity, [id number]- Neuromuscular re-education, 97535- Self Care, 54098- Manual therapy,  41324- Gait training, 40102- Electrical stimulation (manual), Patient/Family education, Balance training, Stair training, Dry Needling, Joint mobilization, Spinal mobilization, Scar mobilization, Vestibular training, and DME instructions  PLAN FOR NEXT SESSION: Review previous HEP and update prn. Monitor vitals. FGA and update goal. Trial foot-up/Bioness on RLE.    Chere Babson E Hinata Diener, PT, DPT 04/27/2023, 10:15 AM

## 2023-04-29 ENCOUNTER — Ambulatory Visit: Admitting: Physical Therapy

## 2023-04-29 VITALS — BP 126/62 | HR 62

## 2023-04-29 DIAGNOSIS — Z9181 History of falling: Secondary | ICD-10-CM

## 2023-04-29 DIAGNOSIS — R2681 Unsteadiness on feet: Secondary | ICD-10-CM

## 2023-04-29 DIAGNOSIS — M6281 Muscle weakness (generalized): Secondary | ICD-10-CM

## 2023-04-29 DIAGNOSIS — R2689 Other abnormalities of gait and mobility: Secondary | ICD-10-CM

## 2023-04-29 NOTE — Therapy (Signed)
 OUTPATIENT PHYSICAL THERAPY NEURO TREATMENT   Patient Name: Cristina Wilson MRN: [id number] DOB:1950/05/10, 73 y.o., female Today's Date: 04/29/2023   PCP: Alvie Ax, MD REFERRING PROVIDER: Ginnie Laine, MD  END OF SESSION:  PT End of Session - 04/29/23 1406     Visit Number 3    Number of Visits 13    Date for PT Re-Evaluation 06/06/23    Authorization Type [employer]    PT Start Time 1404    PT Stop Time 1444    PT Time Calculation (min) 40 min    Equipment Utilized During Treatment Gait belt   Foot-up brace   Activity Tolerance Patient tolerated treatment well    Behavior During Therapy WFL for tasks assessed/performed             Past Medical History:  Diagnosis Date   Abdominal pain 06/06/2018   Acute renal failure (ARF) (HCC) 06/22/2018   Anemia in chronic kidney disease 08/25/2018   Anemia, unspecified 07/05/2018   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 states she has 2 aneurysms   Chronic low back pain 04/12/2017   Chronic pain syndrome 04/12/2017   Closed fracture of left distal radius    De Quervain's tenosynovitis 02/10/2018   Degeneration of lumbar intervertebral disc 03/01/2017   End stage renal failure on dialysis [facility])    M/W/F on [employer]   GERD (gastroesophageal reflux disease)    Goodpasture syndrome (HCC)    Headache 10/30/2014   Hyperlipidemia 04/17/2018   Hypertension    Hypothyroidism    Iron deficiency anemia, unspecified 07/12/2018   PONV (postoperative nausea and vomiting)    violent vomiting   Sacral back pain 05/25/2016   Scoliosis of lumbar spine 03/01/2017   Secondary hyperparathyroidism of renal origin (HCC) 08/22/2018   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   Thyroid  disease    Trigger finger of left thumb 02/10/2018   Past Surgical History:  Procedure Laterality Date   ARTERY BIOPSY  Right 10/31/2014   Procedure: BIOPSY TEMPORAL ARTERY-RIGHT;  Surgeon: Palma Bob, MD;  Location: [facility] OR;  Service: Vascular;  Laterality: Right;   AV FISTULA PLACEMENT Left 10/11/2018   Procedure: left arm ARTERIOVENOUS (AV) FISTULA  creation;  Surgeon: Adine Hoof, MD;  Location: [facility] OR;  Service: Vascular;  Laterality: Left;   BILATERAL CARPAL TUNNEL RELEASE     BIOPSY  04/29/2019   Procedure: BIOPSY;  Surgeon: Felecia Hopper, MD;  Location: [facility];  Service: Gastroenterology;;   BIOPSY  11/05/2019   Procedure: BIOPSY;  Surgeon: Felecia Hopper, MD;  Location: [facility];  Service: Gastroenterology;;   BREAST SURGERY Left    lumpectomy   BUNIONECTOMY Right    CHOLECYSTECTOMY N/A 10/13/2016   Procedure: LAPAROSCOPIC CHOLECYSTECTOMY WITH INTRAOPERATIVE CHOLANGIOGRAM;  Surgeon: Dareen Ebbing, MD;  Location: [facility] OR;  Service: General;  Laterality: N/A;   COLONOSCOPY WITH PROPOFOL  N/A 11/05/2019   Procedure: COLONOSCOPY WITH PROPOFOL ;  Surgeon: Felecia Hopper, MD;  Location: [facility];  Service: Gastroenterology;  Laterality: N/A;   ESOPHAGOGASTRODUODENOSCOPY (EGD) WITH PROPOFOL  N/A 04/29/2019   Procedure: ESOPHAGOGASTRODUODENOSCOPY (EGD) WITH PROPOFOL ;  Surgeon: Felecia Hopper, MD;  Location: [facility];  Service: Gastroenterology;  Laterality: N/A;   EYE SURGERY     surgery for cross eye   FOOT FRACTURE SURGERY     OPEN REDUCTION INTERNAL FIXATION (ORIF) DISTAL RADIAL  FRACTURE Left 03/29/2017   Procedure: OPEN REDUCTION INTERNAL FIXATION (ORIF)LEFT  DISTAL RADIAL FRACTURE;  Surgeon: Brunilda Capra, MD;  Location: [facility];  Service: Orthopedics;  Laterality: Left;   POLYPECTOMY  11/05/2019   Procedure: POLYPECTOMY;  Surgeon: Felecia Hopper, MD;  Location: [facility];  Service: Gastroenterology;;   THYROIDECTOMY     TONSILLECTOMY     WISDOM TOOTH EXTRACTION     Patient Active Problem List   Diagnosis Date Noted   Hypertensive crisis  10/23/2019   History of cerebral aneurysm 10/23/2019   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   Hypoxemia 02/21/2019   Pulmonary edema 02/20/2019   Encounter for immunization 09/26/2018   Anemia in chronic kidney disease 08/25/2018   End stage renal disease (HCC) 08/25/2018   Secondary hyperparathyroidism of renal origin (HCC) 08/22/2018   Referred otalgia of left ear 08/16/2018   Iron deficiency anemia, unspecified 07/12/2018   Shortness of breath 07/06/2018   Anemia, unspecified 07/05/2018   Anxiety 07/05/2018   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   Acute renal failure (ARF) (HCC) 06/22/2018   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   Abdominal pain 06/06/2018   Hydronephrosis due to obstruction of ureteral orifice 06/06/2018   Disease of thyroid  gland 04/17/2018   Hyperlipidemia 04/17/2018   Hypertension 04/17/2018   De Quervain's tenosynovitis 02/10/2018   Trigger finger of left thumb 02/10/2018   Chronic low back pain 04/12/2017   Chronic pain syndrome 04/12/2017   Degeneration of lumbar intervertebral disc 03/01/2017   Scoliosis of lumbar spine 03/01/2017   Sacral back pain 05/25/2016   Eye pain 10/30/2014    ONSET DATE: 04/11/2023 (referral)  REFERRING DIAG: R29.6 (ICD-10-CM) - Repeated falls  THERAPY DIAG:  Unsteadiness on feet  Other abnormalities of gait and mobility  Muscle weakness (generalized)  History of falling  Rationale for Evaluation and Treatment: Rehabilitation  SUBJECTIVE:                                                                                                                                                                                             SUBJECTIVE STATEMENT: Pt received wandering outside in parking lot. Wearing R foot-up brace incorrectly. Denied falls, pain is about the same   Pt accompanied by: self  PERTINENT HISTORY: hypothyroidism; HTN; HLD; Goodpasture syndrome; cerebral  aneurysm; ESRD on TTS HD; chronic pain; ACDF on 03/08/23  PAIN:  Are you having pain? Yes: NPRS scale: 6-7/10 Pain location: RLE  Pain description: Sciatica  Aggravating factors: Sitting for too long  Relieving factors: Tramadol   PRECAUTIONS: Cervical, Fall, and Other: 2 broken ribs on R side   (  follows up w Dr. Ellery Guthrie on 5/2)   RED FLAGS: Cervical red flags: Dysphagia No, Dysmetria No, Diplopia No, Nystagmus No, and Nausea No   WEIGHT BEARING RESTRICTIONS: No  FALLS: Has patient fallen in last 6 months? Yes. Number of falls 3  LIVING ENVIRONMENT: Lives with: lives alone Lives in: House/apartment Stairs: Yes: External: 2 in back, 4-5 in front steps; No rails in back, banister in front Has following equipment at home: Single point cane and Walker - 4 wheeled  PLOF: Independent  PATIENT GOALS: "I don't wanna walk like I feel like I am gonna fall and wobble wobble"   OBJECTIVE:  Note: Objective measures were completed at Evaluation unless otherwise noted.  DIAGNOSTIC FINDINGS: CT of chest from 04/02/23  IMPRESSION: Healing posterior right ninth and tenth rib fractures which are nondisplaced. No hemothorax or pneumothorax.  COGNITION: Overall cognitive status: Within functional limits for tasks assessed   SENSATION: Impaired sensation in RUE and sciatica in RLE   POSTURE: rounded shoulders, forward head, and increased thoracic kyphosis  LOWER EXTREMITY ROM:     Active  Right Eval Left Eval  Hip flexion    Hip extension    Hip abduction    Hip adduction    Hip internal rotation    Hip external rotation    Knee flexion    Knee extension    Ankle dorsiflexion    Ankle plantarflexion    Ankle inversion    Ankle eversion     (Blank rows = not tested)  LOWER EXTREMITY MMT:  Tested in seated position   MMT Right Eval Left Eval  Hip flexion 4- 4  Hip extension    Hip abduction 4+ 4+  Hip adduction 4+ 4+  Hip internal rotation    Hip external rotation     Knee flexion 4+ 4+  Knee extension 4+ 4+  Ankle dorsiflexion 4 4  Ankle plantarflexion    Ankle inversion    Ankle eversion    (Blank rows = not tested)  BED MOBILITY:  Not tested Pt reports pain w/this due to rib pain   TRANSFERS: Sit to stand: SBA  Assistive device utilized: None     Stand to sit: SBA  Assistive device utilized: None       GAIT Gait pattern: step through pattern, decreased hip/knee flexion- Right, decreased ankle dorsiflexion- Right, lateral hip instability, wide BOS, and poor foot clearance- Right Distance walked: Various clinic distances  Assistive device utilized: None Level of assistance: SBA Comments: Note decreased step clearance and DF of RLE but not a true foot drop.    PATIENT SURVEYS:  ABC scale 47.5%   VITALS  Vitals:   04/29/23 1418  BP: 126/62  Pulse: 62                                                                                                                                  TREATMENT:  Ther Act  Adjusted R foot-up brace and educated pt on how to properly lace it into shoe. Pt took photo of proper technique in case she changes shoes. Pt able to connect brace independently.   Self-care Assessed vitals (see above) and WNL   NMR  Remainder of session spent working on fall recovery due to pt's recent falls and difficulty coming to stand:  Pt performed stand > tall kneel w/BUE support on mat table mod I  Once in tall kneel, performed prayer stretch to tall kneel transition x10 for improved glute strength, spinal mobility and pain modulation. Min multimodal cues for neutral cervical spine position throughout. Pt denied pain in ribs or neck w/activity  Practiced tall to half kneel transition w/BUE support on mat (fwd support), x5 reps per side. Cued to to use circumduction to bring foot forward in order to clear foot, especially on R side.  Mass practice of tall kneel >stand using various techniques (fwd, lateral, inch worm) and cued  pt on proper body mechanics throughout. W/fwd position, pt cannot push to stand if in proper half kneel and places stance leg too far posteriorly, resulting in thrusting herself forward onto mat uncontrolled. With lateral technique, pt much more controlled and able to achieve proper half kneel position prior to pushing up to stand and can perform independently. Pt also able to perform inch worm technique well w/wide BOS, but therapist does not recommend this technique as pt is more stable w/lateral technique.  Continue to encourage pt to use rollator if outside to provide some stability if pt were to lose her balance. Pt verbalized understanding but states she does not want to use it.  Escorted pt out of back door to her car out front mod I to assess pt's stability on unlevel terrain w/use of R foot-up brace. No catching of R foot noted.     PATIENT EDUCATION: Education details: Proper use of foot-up brace, fall recovery techniques, using walker outside, using gardening bench outside  Person educated: Patient Education method: Medical illustrator Education comprehension: verbalized understanding and needs further education  HOME EXERCISE PROGRAM: From previous POC:  Access Code: [email or web address]   GOALS: Goals reviewed with patient? Yes  SHORT TERM GOALS: Target date: 05/16/2023   Pt will improve 5 x STS to less than or equal to 16 seconds w/hip-width BOS to demonstrate improved functional strength and transfer efficiency.   Baseline: 19.15s w/wide BOS and no UE support  Goal status: INITIAL  2.  FGA to be assessed and LTG updated  Baseline:  Goal status: INITIAL  3.  Pt will improve gait velocity to at least 3.1 ft/s for improved gait efficiency and independence   Baseline:  Goal status: INITIAL   LONG TERM GOALS: Target date: 05/30/2023   FGA goal  Baseline:  Goal status: INITIAL  2.  Pt will score >/= 60% on ABC scale for improved self-confidence and independence   Baseline: 47.5%  Goal status: INITIAL  3.  Pt will improve 5 x STS to less than or equal to 12 seconds w/proper body mechanics to demonstrate improved functional strength and transfer efficiency.   Baseline: 19.15s w/wide BOS  Goal status: INITIAL  4.  Pt will improve gait velocity to at least 3.3 ft/s for improved gait efficiency and independence   Baseline:  Goal status: INITIAL  5.  Pt will perform floor transfer w/SBA for improved safety at home and fall recovery  Baseline:  Goal status: MET   ASSESSMENT:  CLINICAL IMPRESSION: Emphasis of skilled PT session on pt education regarding proper use of foot-up brace and fall recovery. Pt presented wearing foot-up brace incorrectly on shoe, but verbalized understanding and took picture of proper lacing technique in case she forgets. Pt performed multiple floor transfers today using multiple techniques at mod I level, but is most stable w/lateral technique. Continue to encourage pt to use rollator if on unlevel surfaces, but pt would prefer not to. Continue POC.    OBJECTIVE IMPAIRMENTS: Abnormal gait, decreased activity tolerance, decreased balance, decreased coordination, decreased endurance, difficulty walking, decreased strength, dizziness, impaired sensation, improper body mechanics, and pain   ACTIVITY LIMITATIONS: carrying, lifting, squatting, sleeping, stairs, transfers, bed mobility, reach over head, and locomotion level  PARTICIPATION LIMITATIONS: cleaning, laundry, driving, shopping, community activity, and yard work  PERSONAL FACTORS: Past/current experiences and 1-2 comorbidities: Recent ACDF and chronic R sided sciatica  are also affecting patient's functional outcome.   REHAB POTENTIAL: Good  CLINICAL DECISION MAKING: Evolving/moderate complexity  EVALUATION COMPLEXITY: Moderate  PLAN:  PT FREQUENCY: 2x/week  PT DURATION: 6 weeks  PLANNED INTERVENTIONS: 97164- PT Re-evaluation, 97750- Physical Performance  Testing, 97110-Therapeutic exercises, 97530- Therapeutic activity, 97112- Neuromuscular re-education, 97535- Self Care, 56387- Manual therapy, [phone]- Gait training, [phone]- Electrical stimulation (manual), Patient/Family education, Balance training, Stair training, Dry Needling, Joint mobilization, Spinal mobilization, Scar mobilization, Vestibular training, and DME instructions  PLAN FOR NEXT SESSION: Review previous HEP and update prn. Monitor vitals. FGA and update goal. Trial Bioness on RLE. Ankle stability    Curtistine Downer Karolyna Bianchini, PT, DPT 04/29/2023, 2:50 PM

## 2023-05-02 ENCOUNTER — Ambulatory Visit: Admitting: Physical Therapy

## 2023-05-02 VITALS — BP 125/90 | HR 59

## 2023-05-02 DIAGNOSIS — R2681 Unsteadiness on feet: Secondary | ICD-10-CM

## 2023-05-02 DIAGNOSIS — Z9181 History of falling: Secondary | ICD-10-CM

## 2023-05-02 DIAGNOSIS — M6281 Muscle weakness (generalized): Secondary | ICD-10-CM

## 2023-05-02 DIAGNOSIS — R2689 Other abnormalities of gait and mobility: Secondary | ICD-10-CM

## 2023-05-02 NOTE — Therapy (Signed)
 OUTPATIENT PHYSICAL THERAPY NEURO TREATMENT   Patient Name: Cristina Wilson MRN: [id number] DOB:11-02-1950, 73 y.o., female Today's Date: 05/02/2023   PCP: Alvie Ax, MD REFERRING PROVIDER: Ginnie Laine, MD  END OF SESSION:  PT End of Session - 05/02/23 1023     Visit Number 4    Number of Visits 13    Date for PT Re-Evaluation 06/06/23    Authorization Type [employer]    PT Start Time 1019    PT Stop Time 1058    PT Time Calculation (min) 39 min    Equipment Utilized During Treatment Gait belt   Foot-up brace   Activity Tolerance Patient tolerated treatment well    Behavior During Therapy WFL for tasks assessed/performed             Past Medical History:  Diagnosis Date   Abdominal pain 06/06/2018   Acute renal failure (ARF) (HCC) 06/22/2018   Anemia in chronic kidney disease 08/25/2018   Anemia, unspecified 07/05/2018   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 states she has 2 aneurysms   Chronic low back pain 04/12/2017   Chronic pain syndrome 04/12/2017   Closed fracture of left distal radius    De Quervain's tenosynovitis 02/10/2018   Degeneration of lumbar intervertebral disc 03/01/2017   End stage renal failure on dialysis [facility])    M/W/F on [employer]   GERD (gastroesophageal reflux disease)    Goodpasture syndrome (HCC)    Headache 10/30/2014   Hyperlipidemia 04/17/2018   Hypertension    Hypothyroidism    Iron deficiency anemia, unspecified 07/12/2018   PONV (postoperative nausea and vomiting)    violent vomiting   Sacral back pain 05/25/2016   Scoliosis of lumbar spine 03/01/2017   Secondary hyperparathyroidism of renal origin (HCC) 08/22/2018   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   Thyroid  disease    Trigger finger of left thumb 02/10/2018   Past Surgical History:  Procedure Laterality Date   ARTERY BIOPSY  Right 10/31/2014   Procedure: BIOPSY TEMPORAL ARTERY-RIGHT;  Surgeon: Palma Bob, MD;  Location: [facility] OR;  Service: Vascular;  Laterality: Right;   AV FISTULA PLACEMENT Left 10/11/2018   Procedure: left arm ARTERIOVENOUS (AV) FISTULA  creation;  Surgeon: Adine Hoof, MD;  Location: [facility] OR;  Service: Vascular;  Laterality: Left;   BILATERAL CARPAL TUNNEL RELEASE     BIOPSY  04/29/2019   Procedure: BIOPSY;  Surgeon: Felecia Hopper, MD;  Location: [facility];  Service: Gastroenterology;;   BIOPSY  11/05/2019   Procedure: BIOPSY;  Surgeon: Felecia Hopper, MD;  Location: [facility];  Service: Gastroenterology;;   BREAST SURGERY Left    lumpectomy   BUNIONECTOMY Right    CHOLECYSTECTOMY N/A 10/13/2016   Procedure: LAPAROSCOPIC CHOLECYSTECTOMY WITH INTRAOPERATIVE CHOLANGIOGRAM;  Surgeon: Dareen Ebbing, MD;  Location: [facility] OR;  Service: General;  Laterality: N/A;   COLONOSCOPY WITH PROPOFOL  N/A 11/05/2019   Procedure: COLONOSCOPY WITH PROPOFOL ;  Surgeon: Felecia Hopper, MD;  Location: [facility];  Service: Gastroenterology;  Laterality: N/A;   ESOPHAGOGASTRODUODENOSCOPY (EGD) WITH PROPOFOL  N/A 04/29/2019   Procedure: ESOPHAGOGASTRODUODENOSCOPY (EGD) WITH PROPOFOL ;  Surgeon: Felecia Hopper, MD;  Location: [facility];  Service: Gastroenterology;  Laterality: N/A;   EYE SURGERY     surgery for cross eye   FOOT FRACTURE SURGERY     OPEN REDUCTION INTERNAL FIXATION (ORIF) DISTAL RADIAL  FRACTURE Left 03/29/2017   Procedure: OPEN REDUCTION INTERNAL FIXATION (ORIF)LEFT  DISTAL RADIAL FRACTURE;  Surgeon: Brunilda Capra, MD;  Location: [facility];  Service: Orthopedics;  Laterality: Left;   POLYPECTOMY  11/05/2019   Procedure: POLYPECTOMY;  Surgeon: Felecia Hopper, MD;  Location: [facility];  Service: Gastroenterology;;   THYROIDECTOMY     TONSILLECTOMY     WISDOM TOOTH EXTRACTION     Patient Active Problem List   Diagnosis Date Noted   Hypertensive crisis  10/23/2019   History of cerebral aneurysm 10/23/2019   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   Hypoxemia 02/21/2019   Pulmonary edema 02/20/2019   Encounter for immunization 09/26/2018   Anemia in chronic kidney disease 08/25/2018   End stage renal disease (HCC) 08/25/2018   Secondary hyperparathyroidism of renal origin (HCC) 08/22/2018   Referred otalgia of left ear 08/16/2018   Iron deficiency anemia, unspecified 07/12/2018   Shortness of breath 07/06/2018   Anemia, unspecified 07/05/2018   Anxiety 07/05/2018   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   Acute renal failure (ARF) (HCC) 06/22/2018   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   Abdominal pain 06/06/2018   Hydronephrosis due to obstruction of ureteral orifice 06/06/2018   Disease of thyroid  gland 04/17/2018   Hyperlipidemia 04/17/2018   Hypertension 04/17/2018   De Quervain's tenosynovitis 02/10/2018   Trigger finger of left thumb 02/10/2018   Chronic low back pain 04/12/2017   Chronic pain syndrome 04/12/2017   Degeneration of lumbar intervertebral disc 03/01/2017   Scoliosis of lumbar spine 03/01/2017   Sacral back pain 05/25/2016   Eye pain 10/30/2014    ONSET DATE: 04/11/2023 (referral)  REFERRING DIAG: R29.6 (ICD-10-CM) - Repeated falls  THERAPY DIAG:  Unsteadiness on feet  Other abnormalities of gait and mobility  Muscle weakness (generalized)  History of falling  Rationale for Evaluation and Treatment: Rehabilitation  SUBJECTIVE:                                                                                                                                                                                             SUBJECTIVE STATEMENT: Pt received wandering outside in parking lot. Wearing R foot-up brace correctly[place] sore but did not fall. Sees Dr. Ellery Guthrie on Friday to get X-rays of her C-spine.   Pt  accompanied by: self  PERTINENT HISTORY: hypothyroidism; HTN; HLD; Goodpasture syndrome; cerebral aneurysm; ESRD on TTS HD; chronic pain; ACDF on 03/08/23  PAIN:  Are you having pain? Yes: NPRS scale: 4/10 Pain location: RLE  Pain description: Sciatica  Aggravating factors:  Sitting for too long  Relieving factors: Tramadol   PRECAUTIONS: Cervical, Fall, and Other: 2 broken ribs on R side   (follows up w Dr. Ellery Guthrie on 5/2)   RED FLAGS: Cervical red flags: Dysphagia No, Dysmetria No, Diplopia No, Nystagmus No, and Nausea No   WEIGHT BEARING RESTRICTIONS: No  FALLS: Has patient fallen in last 6 months? Yes. Number of falls 3  LIVING ENVIRONMENT: Lives with: lives alone Lives in: House/apartment Stairs: Yes: External: 2 in back, 4-5 in front steps; No rails in back, banister in front Has following equipment at home: Single point cane and Walker - 4 wheeled  PLOF: Independent  PATIENT GOALS: "I don't wanna walk like I feel like I am gonna fall and wobble wobble"   OBJECTIVE:  Note: Objective measures were completed at Evaluation unless otherwise noted.  DIAGNOSTIC FINDINGS: CT of chest from 04/02/23  IMPRESSION: Healing posterior right ninth and tenth rib fractures which are nondisplaced. No hemothorax or pneumothorax.  COGNITION: Overall cognitive status: Within functional limits for tasks assessed   SENSATION: Impaired sensation in RUE and sciatica in RLE   POSTURE: rounded shoulders, forward head, and increased thoracic kyphosis  LOWER EXTREMITY ROM:     Active  Right Eval Left Eval  Hip flexion    Hip extension    Hip abduction    Hip adduction    Hip internal rotation    Hip external rotation    Knee flexion    Knee extension    Ankle dorsiflexion    Ankle plantarflexion    Ankle inversion    Ankle eversion     (Blank rows = not tested)  LOWER EXTREMITY MMT:  Tested in seated position   MMT Right Eval Left Eval  Hip flexion 4- 4  Hip extension     Hip abduction 4+ 4+  Hip adduction 4+ 4+  Hip internal rotation    Hip external rotation    Knee flexion 4+ 4+  Knee extension 4+ 4+  Ankle dorsiflexion 4 4  Ankle plantarflexion    Ankle inversion    Ankle eversion    (Blank rows = not tested)  BED MOBILITY:  Not tested Pt reports pain w/this due to rib pain   TRANSFERS: Sit to stand: SBA  Assistive device utilized: None     Stand to sit: SBA  Assistive device utilized: None       GAIT Gait pattern: step through pattern, decreased hip/knee flexion- Right, decreased ankle dorsiflexion- Right, lateral hip instability, wide BOS, and poor foot clearance- Right Distance walked: Various clinic distances  Assistive device utilized: None Level of assistance: SBA Comments: Note decreased step clearance and DF of RLE but not a true foot drop.    PATIENT SURVEYS:  ABC scale 47.5%   VITALS  Vitals:   05/02/23 1025  BP: (!) 125/90  Pulse: (!) 59  TREATMENT:    Self-care Assessed vitals (see above) and diastolic BP elevated but within limits for session   Physical Performance   [facility] PT Assessment - 05/02/23 1028       Functional Gait  Assessment   Gait assessed  Yes    Gait Level Surface Walks 20 ft, slow speed, abnormal gait pattern, evidence for imbalance or deviates 10-15 in outside of the 12 in walkway width. Requires more than 7 sec to ambulate 20 ft.   7.87s   Change in Gait Speed Able to change speed, demonstrates mild gait deviations, deviates 6-10 in outside of the 12 in walkway width, or no gait deviations, unable to achieve a major change in velocity, or uses a change in velocity, or uses an assistive device.    Gait with Horizontal Head Turns Performs head turns with moderate changes in gait velocity, slows down, deviates 10-15 in outside 12 in walkway width but recovers, can continue  to walk.   Instability w/turn to L   Gait with Vertical Head Turns Performs task with slight change in gait velocity (eg, minor disruption to smooth gait path), deviates 6 - 10 in outside 12 in walkway width or uses assistive device   Instability w/cervical flexion   Gait and Pivot Turn Pivot turns safely within 3 sec and stops quickly with no loss of balance.    Step Over Obstacle Is able to step over 2 stacked shoe boxes taped together (9 in total height) without changing gait speed. No evidence of imbalance.    Gait with Narrow Base of Support Ambulates 7-9 steps.    Gait with Eyes Closed Walks 20 ft, slow speed, abnormal gait pattern, evidence for imbalance, deviates 10-15 in outside 12 in walkway width. Requires more than 9 sec to ambulate 20 ft.   10.59s, lateral deviation to L   Ambulating Backwards Walks 20 ft, uses assistive device, slower speed, mild gait deviations, deviates 6-10 in outside 12 in walkway width.   13.72s   Steps Alternating feet, must use rail.    Total Score 19    FGA comment: medium fall risk            NMR  Pt performed stand to tall kneel on red floor mat mod I and performed the following on floor for improved functional hip strength, return to gardening and posterior chain strength:  6 Blaze pods on random reach setting performed on 2-3 minute intervals with 3-5 minute rest periods.  Pt requires SBA guarding and BUE support. Round 1:  With pt in tall kneel w/BUE support on bench (lowest height setting) for 3 minutes and pods place on floor from 6-3 o'clock in front of bench. 126 hits. Round 2:  With pt in half-kneel (on LLE) facing mat table and pods in straight line on mat for 2 minutes. 111 hits. Notable errors/deficits:  Pt reported fatigue w/half kneel position in low back but no pain.    PATIENT EDUCATION: Education details: Proper use of foot-up brace, fall recovery techniques, using walker outside, using gardening bench outside  Person educated:  Patient Education method: Medical illustrator Education comprehension: verbalized understanding and needs further education  HOME EXERCISE PROGRAM: From previous POC:  Access Code: [email or web address]   GOALS: Goals reviewed with patient? Yes  SHORT TERM GOALS: Target date: 05/16/2023   Pt will improve 5 x STS to less than or equal to 16 seconds w/hip-width BOS to demonstrate improved functional strength and transfer efficiency.   Baseline:  19.15s w/wide BOS and no UE support  Goal status: INITIAL  2.  FGA to be assessed and LTG updated  Baseline:  Goal status: MET  3.  Pt will improve gait velocity to at least 3.1 ft/s for improved gait efficiency and independence   Baseline:  Goal status: INITIAL   LONG TERM GOALS: Target date: 05/30/2023   Pt will improve FGA to 25/30 for decreased fall risk   Baseline: 19/30 Goal status: REVISED  2.  Pt will score >/= 60% on ABC scale for improved self-confidence and independence  Baseline: 47.5%  Goal status: INITIAL  3.  Pt will improve 5 x STS to less than or equal to 12 seconds w/proper body mechanics to demonstrate improved functional strength and transfer efficiency.   Baseline: 19.15s w/wide BOS  Goal status: INITIAL  4.  Pt will improve gait velocity to at least 3.3 ft/s for improved gait efficiency and independence   Baseline:  Goal status: INITIAL  5.  Pt will perform floor transfer w/SBA for improved safety at home and fall recovery  Baseline:  Goal status: MET   ASSESSMENT:  CLINICAL IMPRESSION: Emphasis of skilled PT session on balance assessment via FGA and functional hip/posterior chain strength for return to gardening. Pt scored a 19/30 on FGA, indicative of medium fall risk and reduced from previous assessment of 27/30 in December 2024. Pt most challenged by stability w/head turns and EC as well as narrow BOS. Pt able to perform floor transfer mod I but fatigues very quickly in tall and half kneel positions  due to hip and posterior chain weakness. Continue POC.    OBJECTIVE IMPAIRMENTS: Abnormal gait, decreased activity tolerance, decreased balance, decreased coordination, decreased endurance, difficulty walking, decreased strength, dizziness, impaired sensation, improper body mechanics, and pain   ACTIVITY LIMITATIONS: carrying, lifting, squatting, sleeping, stairs, transfers, bed mobility, reach over head, and locomotion level  PARTICIPATION LIMITATIONS: cleaning, laundry, driving, shopping, community activity, and yard work  PERSONAL FACTORS: Past/current experiences and 1-2 comorbidities: Recent ACDF and chronic R sided sciatica  are also affecting patient's functional outcome.   REHAB POTENTIAL: Good  CLINICAL DECISION MAKING: Evolving/moderate complexity  EVALUATION COMPLEXITY: Moderate  PLAN:  PT FREQUENCY: 2x/week  PT DURATION: 6 weeks  PLANNED INTERVENTIONS: 97164- PT Re-evaluation, 97750- Physical Performance Testing, 97110-Therapeutic exercises, 97530- Therapeutic activity, 97112- Neuromuscular re-education, 97535- Self Care, 16109- Manual therapy, [phone]- Gait training, [phone]- Electrical stimulation (manual), Patient/Family education, Balance training, Stair training, Dry Needling, Joint mobilization, Spinal mobilization, Scar mobilization, Vestibular training, and DME instructions  PLAN FOR NEXT SESSION: Review previous HEP and update prn. Monitor vitals. Trial Bioness on RLE. Ankle stability, dynadiscs, river rocks    Nohemy Koop E Rowan Blaker, PT, DPT 05/02/2023, 11:05 AM

## 2023-05-04 ENCOUNTER — Ambulatory Visit: Admitting: Physical Therapy

## 2023-05-04 VITALS — BP 116/60 | HR 56

## 2023-05-04 DIAGNOSIS — Z9181 History of falling: Secondary | ICD-10-CM

## 2023-05-04 DIAGNOSIS — R2689 Other abnormalities of gait and mobility: Secondary | ICD-10-CM

## 2023-05-04 DIAGNOSIS — M6281 Muscle weakness (generalized): Secondary | ICD-10-CM

## 2023-05-04 DIAGNOSIS — R2681 Unsteadiness on feet: Secondary | ICD-10-CM

## 2023-05-04 NOTE — Therapy (Signed)
 OUTPATIENT PHYSICAL THERAPY NEURO TREATMENT   Patient Name: Cristina Wilson MRN: [id number] DOB:1950/04/10, 73 y.o., female Today's Date: 05/04/2023   PCP: Alvie Ax, MD REFERRING PROVIDER: Ginnie Laine, MD  END OF SESSION:  PT End of Session - 05/04/23 1021     Visit Number 5    Number of Visits 13    Date for PT Re-Evaluation 06/06/23    Authorization Type [employer]    PT Start Time 1017    PT Stop Time 1100    PT Time Calculation (min) 43 min    Equipment Utilized During Treatment Gait belt   Foot-up brace   Activity Tolerance Patient tolerated treatment well    Behavior During Therapy WFL for tasks assessed/performed             Past Medical History:  Diagnosis Date   Abdominal pain 06/06/2018   Acute renal failure (ARF) (HCC) 06/22/2018   Anemia in chronic kidney disease 08/25/2018   Anemia, unspecified 07/05/2018   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 states she has 2 aneurysms   Chronic low back pain 04/12/2017   Chronic pain syndrome 04/12/2017   Closed fracture of left distal radius    De Quervain's tenosynovitis 02/10/2018   Degeneration of lumbar intervertebral disc 03/01/2017   End stage renal failure on dialysis [facility])    M/W/F on [employer]   GERD (gastroesophageal reflux disease)    Goodpasture syndrome (HCC)    Headache 10/30/2014   Hyperlipidemia 04/17/2018   Hypertension    Hypothyroidism    Iron deficiency anemia, unspecified 07/12/2018   PONV (postoperative nausea and vomiting)    violent vomiting   Sacral back pain 05/25/2016   Scoliosis of lumbar spine 03/01/2017   Secondary hyperparathyroidism of renal origin (HCC) 08/22/2018   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   Thyroid  disease    Trigger finger of left thumb 02/10/2018   Past Surgical History:  Procedure Laterality Date   ARTERY BIOPSY  Right 10/31/2014   Procedure: BIOPSY TEMPORAL ARTERY-RIGHT;  Surgeon: Palma Bob, MD;  Location: [facility] OR;  Service: Vascular;  Laterality: Right;   AV FISTULA PLACEMENT Left 10/11/2018   Procedure: left arm ARTERIOVENOUS (AV) FISTULA  creation;  Surgeon: Adine Hoof, MD;  Location: [facility] OR;  Service: Vascular;  Laterality: Left;   BILATERAL CARPAL TUNNEL RELEASE     BIOPSY  04/29/2019   Procedure: BIOPSY;  Surgeon: Felecia Hopper, MD;  Location: [facility];  Service: Gastroenterology;;   BIOPSY  11/05/2019   Procedure: BIOPSY;  Surgeon: Felecia Hopper, MD;  Location: [facility];  Service: Gastroenterology;;   BREAST SURGERY Left    lumpectomy   BUNIONECTOMY Right    CHOLECYSTECTOMY N/A 10/13/2016   Procedure: LAPAROSCOPIC CHOLECYSTECTOMY WITH INTRAOPERATIVE CHOLANGIOGRAM;  Surgeon: Dareen Ebbing, MD;  Location: [facility] OR;  Service: General;  Laterality: N/A;   COLONOSCOPY WITH PROPOFOL  N/A 11/05/2019   Procedure: COLONOSCOPY WITH PROPOFOL ;  Surgeon: Felecia Hopper, MD;  Location: [facility];  Service: Gastroenterology;  Laterality: N/A;   ESOPHAGOGASTRODUODENOSCOPY (EGD) WITH PROPOFOL  N/A 04/29/2019   Procedure: ESOPHAGOGASTRODUODENOSCOPY (EGD) WITH PROPOFOL ;  Surgeon: Felecia Hopper, MD;  Location: [facility];  Service: Gastroenterology;  Laterality: N/A;   EYE SURGERY     surgery for cross eye   FOOT FRACTURE SURGERY     OPEN REDUCTION INTERNAL FIXATION (ORIF) DISTAL RADIAL  FRACTURE Left 03/29/2017   Procedure: OPEN REDUCTION INTERNAL FIXATION (ORIF)LEFT  DISTAL RADIAL FRACTURE;  Surgeon: Brunilda Capra, MD;  Location: [facility];  Service: Orthopedics;  Laterality: Left;   POLYPECTOMY  11/05/2019   Procedure: POLYPECTOMY;  Surgeon: Felecia Hopper, MD;  Location: [facility];  Service: Gastroenterology;;   THYROIDECTOMY     TONSILLECTOMY     WISDOM TOOTH EXTRACTION     Patient Active Problem List   Diagnosis Date Noted   Hypertensive crisis  10/23/2019   History of cerebral aneurysm 10/23/2019   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   Hypoxemia 02/21/2019   Pulmonary edema 02/20/2019   Encounter for immunization 09/26/2018   Anemia in chronic kidney disease 08/25/2018   End stage renal disease (HCC) 08/25/2018   Secondary hyperparathyroidism of renal origin (HCC) 08/22/2018   Referred otalgia of left ear 08/16/2018   Iron deficiency anemia, unspecified 07/12/2018   Shortness of breath 07/06/2018   Anemia, unspecified 07/05/2018   Anxiety 07/05/2018   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   Acute renal failure (ARF) (HCC) 06/22/2018   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   Abdominal pain 06/06/2018   Hydronephrosis due to obstruction of ureteral orifice 06/06/2018   Disease of thyroid  gland 04/17/2018   Hyperlipidemia 04/17/2018   Hypertension 04/17/2018   De Quervain's tenosynovitis 02/10/2018   Trigger finger of left thumb 02/10/2018   Chronic low back pain 04/12/2017   Chronic pain syndrome 04/12/2017   Degeneration of lumbar intervertebral disc 03/01/2017   Scoliosis of lumbar spine 03/01/2017   Sacral back pain 05/25/2016   Eye pain 10/30/2014    ONSET DATE: 04/11/2023 (referral)  REFERRING DIAG: R29.6 (ICD-10-CM) - Repeated falls  THERAPY DIAG:  Unsteadiness on feet  Other abnormalities of gait and mobility  Muscle weakness (generalized)  History of falling  Rationale for Evaluation and Treatment: Rehabilitation  SUBJECTIVE:                                                                                                                                                                                             SUBJECTIVE STATEMENT: Pt received wandering outside in parking lot. Wearing R foot-up brace correctly. No falls.   Pt accompanied by: self  PERTINENT HISTORY: hypothyroidism; HTN; HLD; Goodpasture syndrome; cerebral aneurysm; ESRD on TTS HD; chronic  pain; ACDF on 03/08/23  PAIN:  Are you having pain? Yes: NPRS scale: 4/10 Pain location: RLE  Pain description: Sciatica  Aggravating factors: Sitting for too long  Relieving factors: Tramadol   PRECAUTIONS: Cervical, Fall, and Other: 2 broken ribs on R side   (follows up w Dr.  Elsner on 5/2)   RED FLAGS: Cervical red flags: Dysphagia No, Dysmetria No, Diplopia No, Nystagmus No, and Nausea No   WEIGHT BEARING RESTRICTIONS: No  FALLS: Has patient fallen in last 6 months? Yes. Number of falls 3  LIVING ENVIRONMENT: Lives with: lives alone Lives in: House/apartment Stairs: Yes: External: 2 in back, 4-5 in front steps; No rails in back, banister in front Has following equipment at home: Single point cane and Walker - 4 wheeled  PLOF: Independent  PATIENT GOALS: "I don't wanna walk like I feel like I am gonna fall and wobble wobble"   OBJECTIVE:  Note: Objective measures were completed at Evaluation unless otherwise noted.  DIAGNOSTIC FINDINGS: CT of chest from 04/02/23  IMPRESSION: Healing posterior right ninth and tenth rib fractures which are nondisplaced. No hemothorax or pneumothorax.  COGNITION: Overall cognitive status: Within functional limits for tasks assessed   SENSATION: Impaired sensation in RUE and sciatica in RLE   POSTURE: rounded shoulders, forward head, and increased thoracic kyphosis  LOWER EXTREMITY ROM:     Active  Right Eval Left Eval  Hip flexion    Hip extension    Hip abduction    Hip adduction    Hip internal rotation    Hip external rotation    Knee flexion    Knee extension    Ankle dorsiflexion    Ankle plantarflexion    Ankle inversion    Ankle eversion     (Blank rows = not tested)  LOWER EXTREMITY MMT:  Tested in seated position   MMT Right Eval Left Eval  Hip flexion 4- 4  Hip extension    Hip abduction 4+ 4+  Hip adduction 4+ 4+  Hip internal rotation    Hip external rotation    Knee flexion 4+ 4+  Knee extension 4+  4+  Ankle dorsiflexion 4 4  Ankle plantarflexion    Ankle inversion    Ankle eversion    (Blank rows = not tested)  BED MOBILITY:  Not tested Pt reports pain w/this due to rib pain   TRANSFERS: Sit to stand: SBA  Assistive device utilized: None     Stand to sit: SBA  Assistive device utilized: None       GAIT Gait pattern: step through pattern, decreased hip/knee flexion- Right, decreased ankle dorsiflexion- Right, lateral hip instability, wide BOS, and poor foot clearance- Right Distance walked: Various clinic distances + >500' outside  Assistive device utilized:  Foot-up brace Level of assistance: Modified independence and SBA Comments: Pt received from front parking lot and ambulated around building to back door to enter gym. Pt w/a few stumbles on incline outside (catching R foot), requiring CGA.    PATIENT SURVEYS:  ABC scale 47.5%   VITALS  Vitals:   05/04/23 1023  BP: 116/60  Pulse: (!) 56  TREATMENT:    Self-care Assessed vitals (see above) and within limits for session. Pt rating dizziness as a 2/10 today.    NMR  The following treadmill training was completed for aerobic/neural priming, endurance, increased step clearance, functional hip strength, single leg stability and gait speed.  - Warmup: 3:00 up to 1.5 mph w/intermittent UE support. Without UE support, noted compensated trendelenburg to L without UE support. Pt trying to increase speed of treadmill as it is hard for her to balance w/slower speed, so informed pt of importance of stability prior to mobility.  - HIIT: 7:00 30sec ON/OFF alternating large blue theraball kicks and normal gait at 1.5 mph. Min cues for large and high velocity kicks, more difficulty kicking w/RLE.   Ther Act Alt fwd/lateral eccentric heel taps from 4" step w/BUE support, 2x10 each side, for improved hip  abduction/quad strength and single leg stability. Pt extremely challenged by this, requiring max cues to perform properly as pt not wanting to flex R knee to tap w/LLE and demonstrating significant lateral lean compensations.  On mat, table top leg crunches, 5x4 reps, for improved hip flexor and core stability. Pt very challenged by this and could not maintain PPT position w/activity. Intermittent LTRs between sets due to back pain.     PATIENT EDUCATION: Education details: Continue HEP, purpose of treadmill training.  Person educated: Patient Education method: Medical illustrator Education comprehension: verbalized understanding and needs further education  HOME EXERCISE PROGRAM: From previous POC:  Access Code: [email or web address]   GOALS: Goals reviewed with patient? Yes  SHORT TERM GOALS: Target date: 05/16/2023   Pt will improve 5 x STS to less than or equal to 16 seconds w/hip-width BOS to demonstrate improved functional strength and transfer efficiency.   Baseline: 19.15s w/wide BOS and no UE support  Goal status: INITIAL  2.  FGA to be assessed and LTG updated  Baseline:  Goal status: MET  3.  Pt will improve gait velocity to at least 3.1 ft/s for improved gait efficiency and independence   Baseline:  Goal status: INITIAL   LONG TERM GOALS: Target date: 05/30/2023   Pt will improve FGA to 25/30 for decreased fall risk   Baseline: 19/30 Goal status: REVISED  2.  Pt will score >/= 60% on ABC scale for improved self-confidence and independence  Baseline: 47.5%  Goal status: INITIAL  3.  Pt will improve 5 x STS to less than or equal to 12 seconds w/proper body mechanics to demonstrate improved functional strength and transfer efficiency.   Baseline: 19.15s w/wide BOS  Goal status: INITIAL  4.  Pt will improve gait velocity to at least 3.3 ft/s for improved gait efficiency and independence   Baseline:  Goal status: INITIAL  5.  Pt will perform floor transfer  w/SBA for improved safety at home and fall recovery  Baseline:  Goal status: MET   ASSESSMENT:  CLINICAL IMPRESSION: Emphasis of skilled PT session on functional hip flexor/abductor strength, gait training on TM and core stability. Pt continues to be limited by significant hip weakness, R>L side. Pt more challenged  by slower walking speed on treadmill due to instability w/single leg stance. Continue POC.    OBJECTIVE IMPAIRMENTS: Abnormal gait, decreased activity tolerance, decreased balance, decreased coordination, decreased endurance, difficulty walking, decreased strength, dizziness, impaired sensation, improper body mechanics, and pain   ACTIVITY LIMITATIONS: carrying, lifting, squatting, sleeping, stairs, transfers, bed mobility, reach over head, and locomotion level  PARTICIPATION LIMITATIONS: cleaning, laundry, driving, shopping, community  activity, and yard work  PERSONAL FACTORS: Past/current experiences and 1-2 comorbidities: Recent ACDF and chronic R sided sciatica  are also affecting patient's functional outcome.   REHAB POTENTIAL: Good  CLINICAL DECISION MAKING: Evolving/moderate complexity  EVALUATION COMPLEXITY: Moderate  PLAN:  PT FREQUENCY: 2x/week  PT DURATION: 6 weeks  PLANNED INTERVENTIONS: 97164- PT Re-evaluation, 97750- Physical Performance Testing, 97110-Therapeutic exercises, 97530- Therapeutic activity, 97112- Neuromuscular re-education, 97535- Self Care, 16109- Manual therapy, [phone]- Gait training, [phone]- Electrical stimulation (manual), Patient/Family education, Balance training, Stair training, Dry Needling, Joint mobilization, Spinal mobilization, Scar mobilization, Vestibular training, and DME instructions  PLAN FOR NEXT SESSION: Review previous HEP and update prn. Monitor vitals. Trial Bioness on RLE. Ankle stability, dynadiscs, river rocks, treadmill    [employer], PT, DPT 05/04/2023, 11:10 AM

## 2023-05-06 DIAGNOSIS — M5416 Radiculopathy, lumbar region: Secondary | ICD-10-CM | POA: Diagnosis not present

## 2023-05-06 DIAGNOSIS — Z6829 Body mass index (BMI) 29.0-29.9, adult: Secondary | ICD-10-CM | POA: Diagnosis not present

## 2023-05-06 DIAGNOSIS — G959 Disease of spinal cord, unspecified: Secondary | ICD-10-CM | POA: Diagnosis not present

## 2023-05-10 ENCOUNTER — Ambulatory Visit: Attending: Vascular Surgery | Admitting: Physical Therapy

## 2023-05-10 VITALS — BP 129/53 | HR 57

## 2023-05-10 DIAGNOSIS — M542 Cervicalgia: Secondary | ICD-10-CM | POA: Diagnosis not present

## 2023-05-10 DIAGNOSIS — R2689 Other abnormalities of gait and mobility: Secondary | ICD-10-CM | POA: Diagnosis not present

## 2023-05-10 DIAGNOSIS — R2681 Unsteadiness on feet: Secondary | ICD-10-CM | POA: Diagnosis not present

## 2023-05-10 DIAGNOSIS — R296 Repeated falls: Secondary | ICD-10-CM | POA: Insufficient documentation

## 2023-05-10 DIAGNOSIS — M6281 Muscle weakness (generalized): Secondary | ICD-10-CM | POA: Diagnosis not present

## 2023-05-10 DIAGNOSIS — M5431 Sciatica, right side: Secondary | ICD-10-CM | POA: Diagnosis not present

## 2023-05-10 NOTE — Therapy (Signed)
 OUTPATIENT PHYSICAL THERAPY NEURO TREATMENT   Patient Name: Cristina Wilson MRN: [id number] DOB:March 26, 1950, 73 y.o., female Today's Date: 05/10/2023   PCP: Alvie Ax, MD REFERRING PROVIDER: Ginnie Laine, MD  END OF SESSION:  PT End of Session - 05/10/23 1238     Visit Number 6    Number of Visits 13    Date for PT Re-Evaluation 06/06/23    Authorization Type [employer]    PT Start Time 1233    PT Stop Time 1312    PT Time Calculation (min) 39 min    Equipment Utilized During Treatment Gait belt   Foot-up brace   Activity Tolerance Patient tolerated treatment well    Behavior During Therapy WFL for tasks assessed/performed              Past Medical History:  Diagnosis Date   Abdominal pain 06/06/2018   Acute renal failure (ARF) (HCC) 06/22/2018   Anemia in chronic kidney disease 08/25/2018   Anemia, unspecified 07/05/2018   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 states she has 2 aneurysms   Chronic low back pain 04/12/2017   Chronic pain syndrome 04/12/2017   Closed fracture of left distal radius    De Quervain's tenosynovitis 02/10/2018   Degeneration of lumbar intervertebral disc 03/01/2017   End stage renal failure on dialysis [facility])    M/W/F on [employer]   GERD (gastroesophageal reflux disease)    Goodpasture syndrome (HCC)    Headache 10/30/2014   Hyperlipidemia 04/17/2018   Hypertension    Hypothyroidism    Iron deficiency anemia, unspecified 07/12/2018   PONV (postoperative nausea and vomiting)    violent vomiting   Sacral back pain 05/25/2016   Scoliosis of lumbar spine 03/01/2017   Secondary hyperparathyroidism of renal origin (HCC) 08/22/2018   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   Thyroid  disease    Trigger finger of left thumb 02/10/2018   Past Surgical History:  Procedure Laterality Date   ARTERY BIOPSY  Right 10/31/2014   Procedure: BIOPSY TEMPORAL ARTERY-RIGHT;  Surgeon: Palma Bob, MD;  Location: [facility] OR;  Service: Vascular;  Laterality: Right;   AV FISTULA PLACEMENT Left 10/11/2018   Procedure: left arm ARTERIOVENOUS (AV) FISTULA  creation;  Surgeon: Adine Hoof, MD;  Location: [facility] OR;  Service: Vascular;  Laterality: Left;   BILATERAL CARPAL TUNNEL RELEASE     BIOPSY  04/29/2019   Procedure: BIOPSY;  Surgeon: Felecia Hopper, MD;  Location: [facility];  Service: Gastroenterology;;   BIOPSY  11/05/2019   Procedure: BIOPSY;  Surgeon: Felecia Hopper, MD;  Location: [facility];  Service: Gastroenterology;;   BREAST SURGERY Left    lumpectomy   BUNIONECTOMY Right    CHOLECYSTECTOMY N/A 10/13/2016   Procedure: LAPAROSCOPIC CHOLECYSTECTOMY WITH INTRAOPERATIVE CHOLANGIOGRAM;  Surgeon: Dareen Ebbing, MD;  Location: [facility] OR;  Service: General;  Laterality: N/A;   COLONOSCOPY WITH PROPOFOL  N/A 11/05/2019   Procedure: COLONOSCOPY WITH PROPOFOL ;  Surgeon: Felecia Hopper, MD;  Location: [facility];  Service: Gastroenterology;  Laterality: N/A;   ESOPHAGOGASTRODUODENOSCOPY (EGD) WITH PROPOFOL  N/A 04/29/2019   Procedure: ESOPHAGOGASTRODUODENOSCOPY (EGD) WITH PROPOFOL ;  Surgeon: Felecia Hopper, MD;  Location: [facility];  Service: Gastroenterology;  Laterality: N/A;   EYE SURGERY     surgery for cross eye   FOOT FRACTURE SURGERY     OPEN REDUCTION INTERNAL FIXATION (ORIF) DISTAL  RADIAL FRACTURE Left 03/29/2017   Procedure: OPEN REDUCTION INTERNAL FIXATION (ORIF)LEFT  DISTAL RADIAL FRACTURE;  Surgeon: Brunilda Capra, MD;  Location: [facility];  Service: Orthopedics;  Laterality: Left;   POLYPECTOMY  11/05/2019   Procedure: POLYPECTOMY;  Surgeon: Felecia Hopper, MD;  Location: [facility];  Service: Gastroenterology;;   THYROIDECTOMY     TONSILLECTOMY     WISDOM TOOTH EXTRACTION     Patient Active Problem List   Diagnosis Date Noted   Hypertensive crisis  10/23/2019   History of cerebral aneurysm 10/23/2019   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   Hypoxemia 02/21/2019   Pulmonary edema 02/20/2019   Encounter for immunization 09/26/2018   Anemia in chronic kidney disease 08/25/2018   End stage renal disease (HCC) 08/25/2018   Secondary hyperparathyroidism of renal origin (HCC) 08/22/2018   Referred otalgia of left ear 08/16/2018   Iron deficiency anemia, unspecified 07/12/2018   Shortness of breath 07/06/2018   Anemia, unspecified 07/05/2018   Anxiety 07/05/2018   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   Acute renal failure (ARF) (HCC) 06/22/2018   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   Abdominal pain 06/06/2018   Hydronephrosis due to obstruction of ureteral orifice 06/06/2018   Disease of thyroid  gland 04/17/2018   Hyperlipidemia 04/17/2018   Hypertension 04/17/2018   De Quervain's tenosynovitis 02/10/2018   Trigger finger of left thumb 02/10/2018   Chronic low back pain 04/12/2017   Chronic pain syndrome 04/12/2017   Degeneration of lumbar intervertebral disc 03/01/2017   Scoliosis of lumbar spine 03/01/2017   Sacral back pain 05/25/2016   Eye pain 10/30/2014    ONSET DATE: 04/11/2023 (referral)  REFERRING DIAG: R29.6 (ICD-10-CM) - Repeated falls  THERAPY DIAG:  Unsteadiness on feet  Other abnormalities of gait and mobility  Muscle weakness (generalized)  Rationale for Evaluation and Treatment: Rehabilitation  SUBJECTIVE:                                                                                                                                                                                             SUBJECTIVE STATEMENT: Pt received wandering outside in parking lot. Wearing R foot-up brace, not anchored properly in shoe. No falls   Pt accompanied by: self  PERTINENT HISTORY: hypothyroidism; HTN; HLD; Goodpasture syndrome; cerebral aneurysm; ESRD on TTS HD; chronic  pain; ACDF on 03/08/23  PAIN:  Are you having pain? Yes: NPRS scale: 3-4/10 Pain location: RLE  Pain description: Sciatica  Aggravating factors: Sitting for too long  Relieving factors: Tramadol   PRECAUTIONS: Cervical, Fall, and Other: 2 broken ribs on R side   (follows up w  Dr. Ellery Guthrie on 5/2)   RED FLAGS: Cervical red flags: Dysphagia No, Dysmetria No, Diplopia No, Nystagmus No, and Nausea No   WEIGHT BEARING RESTRICTIONS: No  FALLS: Has patient fallen in last 6 months? Yes. Number of falls 3  LIVING ENVIRONMENT: Lives with: lives alone Lives in: House/apartment Stairs: Yes: External: 2 in back, 4-5 in front steps; No rails in back, banister in front Has following equipment at home: Single point cane and Walker - 4 wheeled  PLOF: Independent  PATIENT GOALS: "I don't wanna walk like I feel like I am gonna fall and wobble wobble"   OBJECTIVE:  Note: Objective measures were completed at Evaluation unless otherwise noted.  DIAGNOSTIC FINDINGS: CT of chest from 04/02/23  IMPRESSION: Healing posterior right ninth and tenth rib fractures which are nondisplaced. No hemothorax or pneumothorax.  COGNITION: Overall cognitive status: Within functional limits for tasks assessed   SENSATION: Impaired sensation in RUE and sciatica in RLE   POSTURE: rounded shoulders, forward head, and increased thoracic kyphosis  LOWER EXTREMITY ROM:     Active  Right Eval Left Eval  Hip flexion    Hip extension    Hip abduction    Hip adduction    Hip internal rotation    Hip external rotation    Knee flexion    Knee extension    Ankle dorsiflexion    Ankle plantarflexion    Ankle inversion    Ankle eversion     (Blank rows = not tested)  LOWER EXTREMITY MMT:  Tested in seated position   MMT Right Eval Left Eval  Hip flexion 4- 4  Hip extension    Hip abduction 4+ 4+  Hip adduction 4+ 4+  Hip internal rotation    Hip external rotation    Knee flexion 4+ 4+  Knee extension  4+ 4+  Ankle dorsiflexion 4 4  Ankle plantarflexion    Ankle inversion    Ankle eversion    (Blank rows = not tested)  BED MOBILITY:  Not tested Pt reports pain w/this due to rib pain   TRANSFERS: Sit to stand: SBA  Assistive device utilized: None     Stand to sit: SBA  Assistive device utilized: None       GAIT Gait pattern: step through pattern, decreased hip/knee flexion- Right, decreased ankle dorsiflexion- Right, lateral hip instability, wide BOS, and poor foot clearance- Right Distance walked: Various clinic distances + >500' outside  Assistive device utilized:  Foot-up brace Level of assistance: Modified independence and SBA Comments: Pt received from front parking lot and ambulated around building to back door to enter gym. Pt w/a few stumbles on incline outside (catching R foot), requiring CGA.    PATIENT SURVEYS:  ABC scale 47.5%   VITALS  Vitals:   05/10/23 1244  BP: (!) 129/53  Pulse: (!) 57  TREATMENT:    Self-care Assessed vitals (see above) and within limits for session. Pt rating dizziness as a 3-4/10 today.   Ther Act  Adjusted foot-up brace in shoe and educated pt on how to properly lace up shoe around brace. Pt verbalized understanding.   NMR  The following treadmill training was completed for aerobic/neural priming, endurance, increased step clearance, functional hip strength, single leg stability and gait speed.  - Warmup: 3:00 up to 1.5 mph and 2% incline w/intermittent UE support. Without UE support, noted compensated trendelenburg to L but reduced scissoring compared to previous session.  - HIIT: 6:00 30sec ON/OFF alternating large blue theraball kicks and normal gait at 1.5 mph. Min cues for large and high velocity kicks, more difficulty kicking w/RLE. Mod multimodal cues to facilitate heel strike w/IC rather than  foot flat, as pt tends to drag feet and lead with her trunk.  - Cool down: 1:00 at 1.5 mph w/emphasis on heel strike bilaterally and BUE support, as pt unable to do without UE support   At bottom of staircase, double step taps w/red resistance band around ankle, x10 reps per side for improved single leg stability, functional hip strength and LE coordination. Pt w/increased difficulty performing on LLE, w/tendency to adduct leg rather than maintain abduction w/movement.  At ballet bar, glute kick backs w/red theraband around ankle, 2x6-8 reps per side for improved single leg stability and glute strength. Pt required max cues to stop movement when she began to compensate w/trunk lean and locking out opposite knee. Pt reports she is unaware that she is compensating, so cued pt to use mirror for visual biofeedback on physical performance.    PATIENT EDUCATION: Education details: Work on Research scientist (physical sciences) w/gait, importance of stopping exercise when she is compensating.   Person educated: Patient Education method: Medical illustrator Education comprehension: verbalized understanding and needs further education  HOME EXERCISE PROGRAM: From previous POC:  Access Code: [email or web address]   GOALS: Goals reviewed with patient? Yes  SHORT TERM GOALS: Target date: 05/16/2023   Pt will improve 5 x STS to less than or equal to 16 seconds w/hip-width BOS to demonstrate improved functional strength and transfer efficiency.   Baseline: 19.15s w/wide BOS and no UE support  Goal status: INITIAL  2.  FGA to be assessed and LTG updated  Baseline:  Goal status: MET  3.  Pt will improve gait velocity to at least 3.1 ft/s for improved gait efficiency and independence   Baseline:  Goal status: INITIAL   LONG TERM GOALS: Target date: 05/30/2023   Pt will improve FGA to 25/30 for decreased fall risk   Baseline: 19/30 Goal status: REVISED  2.  Pt will score >/= 60% on ABC scale for improved self-confidence  and independence  Baseline: 47.5%  Goal status: INITIAL  3.  Pt will improve 5 x STS to less than or equal to 12 seconds w/proper body mechanics to demonstrate improved functional strength and transfer efficiency.   Baseline: 19.15s w/wide BOS  Goal status: INITIAL  4.  Pt will improve gait velocity to at least 3.3 ft/s for improved gait efficiency and independence   Baseline:  Goal status: INITIAL  5.  Pt will perform floor transfer w/SBA for improved safety at home and fall recovery  Baseline:  Goal status: MET   ASSESSMENT:  CLINICAL IMPRESSION: Emphasis of skilled PT session on gait training on TM w/emphasis on increased step clearance and facilitation of heel strike as well as LE coordination  and single leg stability. Pt continues to be limited by poor lacing technique of foot-up brace when she switches shoes, but verbalized good understanding of how to properly lace shoe today. Pt demonstrates foot flat instead of heel strike w/IC due to leaning too far forward, so cued pt to slow down and focus on heel strike w/gait for improved step clearance and stability. Continue POC.    OBJECTIVE IMPAIRMENTS: Abnormal gait, decreased activity tolerance, decreased balance, decreased coordination, decreased endurance, difficulty walking, decreased strength, dizziness, impaired sensation, improper body mechanics, and pain   ACTIVITY LIMITATIONS: carrying, lifting, squatting, sleeping, stairs, transfers, bed mobility, reach over head, and locomotion level  PARTICIPATION LIMITATIONS: cleaning, laundry, driving, shopping, community activity, and yard work  PERSONAL FACTORS: Past/current experiences and 1-2 comorbidities: Recent ACDF and chronic R sided sciatica  are also affecting patient's functional outcome.   REHAB POTENTIAL: Good  CLINICAL DECISION MAKING: Evolving/moderate complexity  EVALUATION COMPLEXITY: Moderate  PLAN:  PT FREQUENCY: 2x/week  PT DURATION: 6 weeks  PLANNED  INTERVENTIONS: 97164- PT Re-evaluation, 97750- Physical Performance Testing, 97110-Therapeutic exercises, 97530- Therapeutic activity, 97112- Neuromuscular re-education, 97535- Self Care, 82956- Manual therapy, [phone]- Gait training, [phone]- Electrical stimulation (manual), Patient/Family education, Balance training, Stair training, Dry Needling, Joint mobilization, Spinal mobilization, Scar mobilization, Vestibular training, and DME instructions  PLAN FOR NEXT SESSION: Review previous HEP and update prn. Monitor vitals. Trial Bioness on RLE. Ankle stability, dynadiscs, river rocks, treadmill    [employer], PT, DPT 05/10/2023, 1:13 PM

## 2023-05-13 ENCOUNTER — Ambulatory Visit: Admitting: Physical Therapy

## 2023-05-13 VITALS — BP 120/74 | HR 58

## 2023-05-13 DIAGNOSIS — R2689 Other abnormalities of gait and mobility: Secondary | ICD-10-CM | POA: Diagnosis not present

## 2023-05-13 DIAGNOSIS — M5431 Sciatica, right side: Secondary | ICD-10-CM | POA: Diagnosis not present

## 2023-05-13 DIAGNOSIS — M542 Cervicalgia: Secondary | ICD-10-CM | POA: Diagnosis not present

## 2023-05-13 DIAGNOSIS — R296 Repeated falls: Secondary | ICD-10-CM | POA: Diagnosis not present

## 2023-05-13 DIAGNOSIS — R2681 Unsteadiness on feet: Secondary | ICD-10-CM

## 2023-05-13 DIAGNOSIS — M6281 Muscle weakness (generalized): Secondary | ICD-10-CM | POA: Diagnosis not present

## 2023-05-13 NOTE — Therapy (Signed)
 OUTPATIENT PHYSICAL THERAPY NEURO TREATMENT   Patient Name: Cristina Wilson MRN: [id number] DOB:Dec 04, 1950, 73 y.o., female Today's Date: 05/13/2023   PCP: Alvie Ax, MD REFERRING PROVIDER: Ginnie Laine, MD  END OF SESSION:  PT End of Session - 05/13/23 1024     Visit Number 7    Number of Visits 13    Date for PT Re-Evaluation 06/06/23    Authorization Type [employer]    PT Start Time 1018    PT Stop Time 1059    PT Time Calculation (min) 41 min    Equipment Utilized During Treatment Gait belt   Foot-up brace   Activity Tolerance Patient tolerated treatment well    Behavior During Therapy WFL for tasks assessed/performed               Past Medical History:  Diagnosis Date   Abdominal pain 06/06/2018   Acute renal failure (ARF) (HCC) 06/22/2018   Anemia in chronic kidney disease 08/25/2018   Anemia, unspecified 07/05/2018   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 states she has 2 aneurysms   Chronic low back pain 04/12/2017   Chronic pain syndrome 04/12/2017   Closed fracture of left distal radius    De Quervain's tenosynovitis 02/10/2018   Degeneration of lumbar intervertebral disc 03/01/2017   End stage renal failure on dialysis [facility])    M/W/F on [employer]   GERD (gastroesophageal reflux disease)    Goodpasture syndrome (HCC)    Headache 10/30/2014   Hyperlipidemia 04/17/2018   Hypertension    Hypothyroidism    Iron deficiency anemia, unspecified 07/12/2018   PONV (postoperative nausea and vomiting)    violent vomiting   Sacral back pain 05/25/2016   Scoliosis of lumbar spine 03/01/2017   Secondary hyperparathyroidism of renal origin (HCC) 08/22/2018   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   Thyroid  disease    Trigger finger of left thumb 02/10/2018   Past Surgical History:  Procedure Laterality Date   ARTERY BIOPSY  Right 10/31/2014   Procedure: BIOPSY TEMPORAL ARTERY-RIGHT;  Surgeon: Palma Bob, MD;  Location: [facility] OR;  Service: Vascular;  Laterality: Right;   AV FISTULA PLACEMENT Left 10/11/2018   Procedure: left arm ARTERIOVENOUS (AV) FISTULA  creation;  Surgeon: Adine Hoof, MD;  Location: [facility] OR;  Service: Vascular;  Laterality: Left;   BILATERAL CARPAL TUNNEL RELEASE     BIOPSY  04/29/2019   Procedure: BIOPSY;  Surgeon: Felecia Hopper, MD;  Location: [facility];  Service: Gastroenterology;;   BIOPSY  11/05/2019   Procedure: BIOPSY;  Surgeon: Felecia Hopper, MD;  Location: [facility];  Service: Gastroenterology;;   BREAST SURGERY Left    lumpectomy   BUNIONECTOMY Right    CHOLECYSTECTOMY N/A 10/13/2016   Procedure: LAPAROSCOPIC CHOLECYSTECTOMY WITH INTRAOPERATIVE CHOLANGIOGRAM;  Surgeon: Dareen Ebbing, MD;  Location: [facility] OR;  Service: General;  Laterality: N/A;   COLONOSCOPY WITH PROPOFOL  N/A 11/05/2019   Procedure: COLONOSCOPY WITH PROPOFOL ;  Surgeon: Felecia Hopper, MD;  Location: [facility];  Service: Gastroenterology;  Laterality: N/A;   ESOPHAGOGASTRODUODENOSCOPY (EGD) WITH PROPOFOL  N/A 04/29/2019   Procedure: ESOPHAGOGASTRODUODENOSCOPY (EGD) WITH PROPOFOL ;  Surgeon: Felecia Hopper, MD;  Location: [facility];  Service: Gastroenterology;  Laterality: N/A;   EYE SURGERY     surgery for cross eye   FOOT FRACTURE SURGERY     OPEN REDUCTION INTERNAL FIXATION (ORIF)  DISTAL RADIAL FRACTURE Left 03/29/2017   Procedure: OPEN REDUCTION INTERNAL FIXATION (ORIF)LEFT  DISTAL RADIAL FRACTURE;  Surgeon: Brunilda Capra, MD;  Location: [facility];  Service: Orthopedics;  Laterality: Left;   POLYPECTOMY  11/05/2019   Procedure: POLYPECTOMY;  Surgeon: Felecia Hopper, MD;  Location: [facility];  Service: Gastroenterology;;   THYROIDECTOMY     TONSILLECTOMY     WISDOM TOOTH EXTRACTION     Patient Active Problem List   Diagnosis Date Noted   Hypertensive crisis  10/23/2019   History of cerebral aneurysm 10/23/2019   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   Hypoxemia 02/21/2019   Pulmonary edema 02/20/2019   Encounter for immunization 09/26/2018   Anemia in chronic kidney disease 08/25/2018   End stage renal disease (HCC) 08/25/2018   Secondary hyperparathyroidism of renal origin (HCC) 08/22/2018   Referred otalgia of left ear 08/16/2018   Iron deficiency anemia, unspecified 07/12/2018   Shortness of breath 07/06/2018   Anemia, unspecified 07/05/2018   Anxiety 07/05/2018   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   Acute renal failure (ARF) (HCC) 06/22/2018   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   Abdominal pain 06/06/2018   Hydronephrosis due to obstruction of ureteral orifice 06/06/2018   Disease of thyroid  gland 04/17/2018   Hyperlipidemia 04/17/2018   Hypertension 04/17/2018   De Quervain's tenosynovitis 02/10/2018   Trigger finger of left thumb 02/10/2018   Chronic low back pain 04/12/2017   Chronic pain syndrome 04/12/2017   Degeneration of lumbar intervertebral disc 03/01/2017   Scoliosis of lumbar spine 03/01/2017   Sacral back pain 05/25/2016   Eye pain 10/30/2014    ONSET DATE: 04/11/2023 (referral)  REFERRING DIAG: R29.6 (ICD-10-CM) - Repeated falls  THERAPY DIAG:  Unsteadiness on feet  Other abnormalities of gait and mobility  Muscle weakness (generalized)  Sciatica, right side  Rationale for Evaluation and Treatment: Rehabilitation  SUBJECTIVE:                                                                                                                                                                                             SUBJECTIVE STATEMENT: Pt received wandering outside in parking lot. Wearing R foot-up brace correctly. Had a bad pain day yesterday. No falls.   Pt accompanied by: self  PERTINENT HISTORY: hypothyroidism; HTN; HLD; Goodpasture syndrome; cerebral  aneurysm; ESRD on TTS HD; chronic pain; ACDF on 03/08/23  PAIN:  Are you having pain? Yes: NPRS scale: 6/10 Pain location: RLE, neck   Pain description: Achy/throbbing   Aggravating factors: Sitting for too long  Relieving factors: Tramadol   PRECAUTIONS: Cervical, Fall, and Other: 2  broken ribs on R side  (follows up w Dr. Ellery Guthrie on 5/2)   RED FLAGS: Cervical red flags: Dysphagia No, Dysmetria No, Diplopia No, Nystagmus No, and Nausea No   WEIGHT BEARING RESTRICTIONS: No  FALLS: Has patient fallen in last 6 months? Yes. Number of falls 3  LIVING ENVIRONMENT: Lives with: lives alone Lives in: House/apartment Stairs: Yes: External: 2 in back, 4-5 in front steps; No rails in back, banister in front Has following equipment at home: Single point cane and Walker - 4 wheeled  PLOF: Independent  PATIENT GOALS: "I don't wanna walk like I feel like I am gonna fall and wobble wobble"   OBJECTIVE:  Note: Objective measures were completed at Evaluation unless otherwise noted.  DIAGNOSTIC FINDINGS: CT of chest from 04/02/23  IMPRESSION: Healing posterior right ninth and tenth rib fractures which are nondisplaced. No hemothorax or pneumothorax.  COGNITION: Overall cognitive status: Within functional limits for tasks assessed   SENSATION: Impaired sensation in RUE and sciatica in RLE   POSTURE: rounded shoulders, forward head, and increased thoracic kyphosis  LOWER EXTREMITY ROM:     Active  Right Eval Left Eval  Hip flexion    Hip extension    Hip abduction    Hip adduction    Hip internal rotation    Hip external rotation    Knee flexion    Knee extension    Ankle dorsiflexion    Ankle plantarflexion    Ankle inversion    Ankle eversion     (Blank rows = not tested)  LOWER EXTREMITY MMT:  Tested in seated position   MMT Right Eval Left Eval  Hip flexion 4- 4  Hip extension    Hip abduction 4+ 4+  Hip adduction 4+ 4+  Hip internal rotation    Hip external  rotation    Knee flexion 4+ 4+  Knee extension 4+ 4+  Ankle dorsiflexion 4 4  Ankle plantarflexion    Ankle inversion    Ankle eversion    (Blank rows = not tested)  BED MOBILITY:  Not tested Pt reports pain w/this due to rib pain   TRANSFERS: Sit to stand: SBA  Assistive device utilized: None     Stand to sit: SBA  Assistive device utilized: None       GAIT Gait pattern: step through pattern, decreased hip/knee flexion- Right, decreased ankle dorsiflexion- Right, lateral hip instability, wide BOS, and poor foot clearance- Right Distance walked: Various clinic distances + >500' outside  Assistive device utilized: Foot-up brace Level of assistance: Modified independence and SBA Comments: Pt received from front parking lot and ambulated around building to back door to enter gym. Pt w/a few stumbles on incline outside (catching R foot), requiring CGA.    PATIENT SURVEYS:  ABC scale 47.5%   VITALS  Vitals:   05/13/23 1026  BP: 120/74  Pulse: (!) 58  TREATMENT:    Self-care Assessed vitals (see above) and within limits for session. Pt rating dizziness as a 4/10 today.    NMR  The following treadmill training was completed for aerobic/neural priming, endurance, increased step clearance, functional hip strength, single leg stability and gait speed.  - Warmup: 3:00 up to 1.5 mph and 3% incline w/o UE support. Noted improved heel strike and reduced instability to L side today  - HIIT: 4:00 45 sec at 3.0 mph and 15 sec at 1.5 mph for interval training. Pt very fatigued by this but demonstrated adequate step clearance throughout  - Cool down: 1:00 at 1.5 mph w/BUE support due to fatigue   Ther Act  Sit to stands w/single foot elevated on 2" step holding 6# ball, x15 reps per side for improved single leg stability and functional BLE strength. Pt  w/increased difficulty when RLE on step.  Quadruped bird dogs w/black resistance band around leg for added hamstring and glute challenge, x8 reps per side. CGA for safety, pt less stable on LLE  At ballet bar, river rocks obstacle navigation 3x2 reps down and back w/various courses for improved LE coordination and single leg stability. Pt most challenged if rocks spread too far apart, but otherwise was able to stabilize well w/CGA and single UE support on ballet bar.     PATIENT EDUCATION: Education details: Work on Research scientist (physical sciences) w/gait, importance of stopping exercise when she is compensating.   Person educated: Patient Education method: Medical illustrator Education comprehension: verbalized understanding and needs further education  HOME EXERCISE PROGRAM: From previous POC:  Access Code: [email or web address]   GOALS: Goals reviewed with patient? Yes  SHORT TERM GOALS: Target date: 05/16/2023   Pt will improve 5 x STS to less than or equal to 16 seconds w/hip-width BOS to demonstrate improved functional strength and transfer efficiency.   Baseline: 19.15s w/wide BOS and no UE support  Goal status: INITIAL  2.  FGA to be assessed and LTG updated  Baseline:  Goal status: MET  3.  Pt will improve gait velocity to at least 3.1 ft/s for improved gait efficiency and independence   Baseline:  Goal status: INITIAL   LONG TERM GOALS: Target date: 05/30/2023   Pt will improve FGA to 25/30 for decreased fall risk   Baseline: 19/30 Goal status: REVISED  2.  Pt will score >/= 60% on ABC scale for improved self-confidence and independence  Baseline: 47.5%  Goal status: INITIAL  3.  Pt will improve 5 x STS to less than or equal to 12 seconds w/proper body mechanics to demonstrate improved functional strength and transfer efficiency.   Baseline: 19.15s w/wide BOS  Goal status: INITIAL  4.  Pt will improve gait velocity to at least 3.3 ft/s for improved gait efficiency and  independence   Baseline:  Goal status: INITIAL  5.  Pt will perform floor transfer w/SBA for improved safety at home and fall recovery  Baseline:  Goal status: MET   ASSESSMENT:  CLINICAL IMPRESSION: Emphasis of skilled PT session on gait training on TM w/emphasis on speed and facilitation of heel strike as well as LE coordination and single leg stability. Pt having more pain today but was able to participate in session. Pt fatigued quickly w/speed intervals on treadmill, but facilitated bilateral heel strike and step clearance well compared to previous session. Pt demonstrates decreased stability on LLE > RLE due to glute med weakness, but continues to progress towards LTGs. Continue POC.  OBJECTIVE IMPAIRMENTS: Abnormal gait, decreased activity tolerance, decreased balance, decreased coordination, decreased endurance, difficulty walking, decreased strength, dizziness, impaired sensation, improper body mechanics, and pain   ACTIVITY LIMITATIONS: carrying, lifting, squatting, sleeping, stairs, transfers, bed mobility, reach over head, and locomotion level  PARTICIPATION LIMITATIONS: cleaning, laundry, driving, shopping, community activity, and yard work  PERSONAL FACTORS: Past/current experiences and 1-2 comorbidities: Recent ACDF and chronic R sided sciatica are also affecting patient's functional outcome.   REHAB POTENTIAL: Good  CLINICAL DECISION MAKING: Evolving/moderate complexity  EVALUATION COMPLEXITY: Moderate  PLAN:  PT FREQUENCY: 2x/week  PT DURATION: 6 weeks  PLANNED INTERVENTIONS: 97164- PT Re-evaluation, 97750- Physical Performance Testing, 97110-Therapeutic exercises, 97530- Therapeutic activity, 97112- Neuromuscular re-education, 97535- Self Care, 16109- Manual therapy, [phone]- Gait training, [phone]- Electrical stimulation (manual), Patient/Family education, Balance training, Stair training, Dry Needling, Joint mobilization, Spinal mobilization, Scar mobilization,  Vestibular training, and DME instructions  PLAN FOR NEXT SESSION: check goals. Review previous HEP and update prn. Monitor vitals. Trial Bioness on RLE. Ankle stability, dynadiscs, river rocks, treadmill    [employer], PT, DPT 05/13/2023, 11:00 AM

## 2023-05-17 ENCOUNTER — Ambulatory Visit: Admitting: Physical Therapy

## 2023-05-17 VITALS — BP 113/61 | HR 65

## 2023-05-17 DIAGNOSIS — R2689 Other abnormalities of gait and mobility: Secondary | ICD-10-CM

## 2023-05-17 DIAGNOSIS — R2681 Unsteadiness on feet: Secondary | ICD-10-CM | POA: Diagnosis not present

## 2023-05-17 DIAGNOSIS — R296 Repeated falls: Secondary | ICD-10-CM

## 2023-05-17 DIAGNOSIS — M5431 Sciatica, right side: Secondary | ICD-10-CM | POA: Diagnosis not present

## 2023-05-17 DIAGNOSIS — M542 Cervicalgia: Secondary | ICD-10-CM | POA: Diagnosis not present

## 2023-05-17 DIAGNOSIS — M6281 Muscle weakness (generalized): Secondary | ICD-10-CM | POA: Diagnosis not present

## 2023-05-17 NOTE — Therapy (Signed)
 OUTPATIENT PHYSICAL THERAPY NEURO TREATMENT   Patient Name: Cristina Wilson MRN: [id number] DOB:1950/03/25, 73 y.o., female Today's Date: 05/17/2023   PCP: Alvie Ax, MD REFERRING PROVIDER: Ginnie Laine, MD  END OF SESSION:  PT End of Session - 05/17/23 1237     Visit Number 8    Number of Visits 13    Date for PT Re-Evaluation 06/06/23    Authorization Type [employer]    PT Start Time 1231    PT Stop Time 1310    PT Time Calculation (min) 39 min    Equipment Utilized During Treatment Gait belt   Foot-up brace   Activity Tolerance Patient limited by fatigue    Behavior During Therapy WFL for tasks assessed/performed                Past Medical History:  Diagnosis Date   Abdominal pain 06/06/2018   Acute renal failure (ARF) (HCC) 06/22/2018   Anemia in chronic kidney disease 08/25/2018   Anemia, unspecified 07/05/2018   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 states she has 2 aneurysms   Chronic low back pain 04/12/2017   Chronic pain syndrome 04/12/2017   Closed fracture of left distal radius    De Quervain's tenosynovitis 02/10/2018   Degeneration of lumbar intervertebral disc 03/01/2017   End stage renal failure on dialysis [facility])    M/W/F on [employer]   GERD (gastroesophageal reflux disease)    Goodpasture syndrome (HCC)    Headache 10/30/2014   Hyperlipidemia 04/17/2018   Hypertension    Hypothyroidism    Iron deficiency anemia, unspecified 07/12/2018   PONV (postoperative nausea and vomiting)    violent vomiting   Sacral back pain 05/25/2016   Scoliosis of lumbar spine 03/01/2017   Secondary hyperparathyroidism of renal origin (HCC) 08/22/2018   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   Thyroid  disease    Trigger finger of left thumb 02/10/2018   Past Surgical History:  Procedure Laterality Date   ARTERY BIOPSY  Right 10/31/2014   Procedure: BIOPSY TEMPORAL ARTERY-RIGHT;  Surgeon: Palma Bob, MD;  Location: [facility] OR;  Service: Vascular;  Laterality: Right;   AV FISTULA PLACEMENT Left 10/11/2018   Procedure: left arm ARTERIOVENOUS (AV) FISTULA  creation;  Surgeon: Adine Hoof, MD;  Location: [facility] OR;  Service: Vascular;  Laterality: Left;   BILATERAL CARPAL TUNNEL RELEASE     BIOPSY  04/29/2019   Procedure: BIOPSY;  Surgeon: Felecia Hopper, MD;  Location: [facility];  Service: Gastroenterology;;   BIOPSY  11/05/2019   Procedure: BIOPSY;  Surgeon: Felecia Hopper, MD;  Location: [facility];  Service: Gastroenterology;;   BREAST SURGERY Left    lumpectomy   BUNIONECTOMY Right    CHOLECYSTECTOMY N/A 10/13/2016   Procedure: LAPAROSCOPIC CHOLECYSTECTOMY WITH INTRAOPERATIVE CHOLANGIOGRAM;  Surgeon: Dareen Ebbing, MD;  Location: [facility] OR;  Service: General;  Laterality: N/A;   COLONOSCOPY WITH PROPOFOL  N/A 11/05/2019   Procedure: COLONOSCOPY WITH PROPOFOL ;  Surgeon: Felecia Hopper, MD;  Location: [facility];  Service: Gastroenterology;  Laterality: N/A;   ESOPHAGOGASTRODUODENOSCOPY (EGD) WITH PROPOFOL  N/A 04/29/2019   Procedure: ESOPHAGOGASTRODUODENOSCOPY (EGD) WITH PROPOFOL ;  Surgeon: Felecia Hopper, MD;  Location: [facility];  Service: Gastroenterology;  Laterality: N/A;   EYE SURGERY     surgery for cross eye   FOOT FRACTURE SURGERY     OPEN REDUCTION INTERNAL FIXATION (  ORIF) DISTAL RADIAL FRACTURE Left 03/29/2017   Procedure: OPEN REDUCTION INTERNAL FIXATION (ORIF)LEFT  DISTAL RADIAL FRACTURE;  Surgeon: Brunilda Capra, MD;  Location: [facility];  Service: Orthopedics;  Laterality: Left;   POLYPECTOMY  11/05/2019   Procedure: POLYPECTOMY;  Surgeon: Felecia Hopper, MD;  Location: [facility];  Service: Gastroenterology;;   THYROIDECTOMY     TONSILLECTOMY     WISDOM TOOTH EXTRACTION     Patient Active Problem List   Diagnosis Date Noted   Hypertensive crisis  10/23/2019   History of cerebral aneurysm 10/23/2019   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   Hypoxemia 02/21/2019   Pulmonary edema 02/20/2019   Encounter for immunization 09/26/2018   Anemia in chronic kidney disease 08/25/2018   End stage renal disease (HCC) 08/25/2018   Secondary hyperparathyroidism of renal origin (HCC) 08/22/2018   Referred otalgia of left ear 08/16/2018   Iron deficiency anemia, unspecified 07/12/2018   Shortness of breath 07/06/2018   Anemia, unspecified 07/05/2018   Anxiety 07/05/2018   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   Acute renal failure (ARF) (HCC) 06/22/2018   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   Abdominal pain 06/06/2018   Hydronephrosis due to obstruction of ureteral orifice 06/06/2018   Disease of thyroid  gland 04/17/2018   Hyperlipidemia 04/17/2018   Hypertension 04/17/2018   De Quervain's tenosynovitis 02/10/2018   Trigger finger of left thumb 02/10/2018   Chronic low back pain 04/12/2017   Chronic pain syndrome 04/12/2017   Degeneration of lumbar intervertebral disc 03/01/2017   Scoliosis of lumbar spine 03/01/2017   Sacral back pain 05/25/2016   Eye pain 10/30/2014    ONSET DATE: 04/11/2023 (referral)  REFERRING DIAG: R29.6 (ICD-10-CM) - Repeated falls  THERAPY DIAG:  Unsteadiness on feet  Other abnormalities of gait and mobility  Repeated falls  Rationale for Evaluation and Treatment: Rehabilitation  SUBJECTIVE:                                                                                                                                                                                             SUBJECTIVE STATEMENT: Pt received outside, walked around building to back entrance into clinic. Reports she is very fatigued today, does not know why. Had one fall since last visit, turned too quickly and feet got caught. Was able to crawl to a chair and stand independently.   Pt  accompanied by: self  PERTINENT HISTORY: hypothyroidism; HTN; HLD; Goodpasture syndrome; cerebral aneurysm; ESRD on TTS HD; chronic pain; ACDF on 03/08/23  PAIN:  Are you having pain? Yes: NPRS scale: 6-7/10 Pain location: RLE, neck   Pain description:  Achy/throbbing   Aggravating factors: Sitting for too long  Relieving factors: Tramadol   PRECAUTIONS: Cervical, Fall, and Other: 2 broken ribs on R side  (follows up w Dr. Ellery Guthrie on 5/2)   RED FLAGS: Cervical red flags: Dysphagia No, Dysmetria No, Diplopia No, Nystagmus No, and Nausea No   WEIGHT BEARING RESTRICTIONS: No  FALLS: Has patient fallen in last 6 months? Yes. Number of falls 3  LIVING ENVIRONMENT: Lives with: lives alone Lives in: House/apartment Stairs: Yes: External: 2 in back, 4-5 in front steps; No rails in back, banister in front Has following equipment at home: Single point cane and Walker - 4 wheeled  PLOF: Independent  PATIENT GOALS: "I don't wanna walk like I feel like I am gonna fall and wobble wobble"   OBJECTIVE:  Note: Objective measures were completed at Evaluation unless otherwise noted.  DIAGNOSTIC FINDINGS: CT of chest from 04/02/23  IMPRESSION: Healing posterior right ninth and tenth rib fractures which are nondisplaced. No hemothorax or pneumothorax.  COGNITION: Overall cognitive status: Within functional limits for tasks assessed   SENSATION: Impaired sensation in RUE and sciatica in RLE   POSTURE: rounded shoulders, forward head, and increased thoracic kyphosis  LOWER EXTREMITY ROM:     Active  Right Eval Left Eval  Hip flexion    Hip extension    Hip abduction    Hip adduction    Hip internal rotation    Hip external rotation    Knee flexion    Knee extension    Ankle dorsiflexion    Ankle plantarflexion    Ankle inversion    Ankle eversion     (Blank rows = not tested)  LOWER EXTREMITY MMT:  Tested in seated position   MMT Right Eval Left Eval  Hip flexion 4- 4  Hip  extension    Hip abduction 4+ 4+  Hip adduction 4+ 4+  Hip internal rotation    Hip external rotation    Knee flexion 4+ 4+  Knee extension 4+ 4+  Ankle dorsiflexion 4 4  Ankle plantarflexion    Ankle inversion    Ankle eversion    (Blank rows = not tested)  BED MOBILITY:  Not tested Pt reports pain w/this due to rib pain   TRANSFERS: Sit to stand: SBA  Assistive device utilized: None     Stand to sit: SBA  Assistive device utilized: None       GAIT Gait pattern: step through pattern, decreased hip/knee flexion- Right, decreased ankle dorsiflexion- Right, lateral hip instability, wide BOS, and poor foot clearance- Right Distance walked: Various clinic distances + >500' outside  Assistive device utilized: Foot-up brace Level of assistance: Modified independence and SBA Comments: Pt received from front parking lot and ambulated around building to back door to enter gym. Pt w/a few stumbles on incline outside (catching R foot), requiring CGA.    PATIENT SURVEYS:  ABC scale 47.5%   VITALS  Vitals:   05/17/23 1238  BP: 113/61  Pulse: 65  SpO2: 98%  TREATMENT:    Self-care Assessed vitals (see above) and within limits for session.    Ther Act  SciFit multi-peaks level 6.5 for 8 minutes using BUE/BLEs for neural priming for reciprocal movement, dynamic cardiovascular warmup and increased amplitude of stepping. At 4 minute mark, assessed SpO2 and it was 99% on RA. RPE of 6-7/10 following activity  The following were performed on mat table for improved hip strength and core stability:  Bird dogs moving legs only, x15 reps per side. Increased difficulty stabilizing on LLE  Prone hip abduction w/red theraband around ankles, x15 reps   Prone hip extension w/abduction w/red theraband around ankles, 3x5 reps   Physical Performance   [facility] PT  Assessment - 05/17/23 1305       Transfers   Five time sit to stand comments  9.9s   No UE support hip-width BOS           Escorted pt out to car w/distant SBA due to fatigue and instability. No LOB noted    PATIENT EDUCATION: Education details: Continue HEP  Person educated: Patient Education method: Medical illustrator Education comprehension: verbalized understanding and needs further education  HOME EXERCISE PROGRAM: From previous POC:  Access Code: [email or web address]   GOALS: Goals reviewed with patient? Yes  SHORT TERM GOALS: Target date: 05/16/2023   Pt will improve 5 x STS to less than or equal to 16 seconds w/hip-width BOS to demonstrate improved functional strength and transfer efficiency.   Baseline: 19.15s w/wide BOS and no UE support; 9.9s no UE support  Goal status: MET  2.  FGA to be assessed and LTG updated  Baseline:  Goal status: MET  3.  Pt will improve gait velocity to at least 3.1 ft/s for improved gait efficiency and independence   Baseline:  Goal status: INITIAL   LONG TERM GOALS: Target date: 05/30/2023   Pt will improve FGA to 25/30 for decreased fall risk   Baseline: 19/30 Goal status: REVISED  2.  Pt will score >/= 60% on ABC scale for improved self-confidence and independence  Baseline: 47.5%  Goal status: INITIAL  3.  Pt will improve 5 x STS to less than or equal to 12 seconds w/proper body mechanics to demonstrate improved functional strength and transfer efficiency.   Baseline: 19.15s w/wide BOS; 9.9s w/no UE support (5/13)  Goal status: MET  4.  Pt will improve gait velocity to at least 3.3 ft/s for improved gait efficiency and independence   Baseline:  Goal status: INITIAL  5.  Pt will perform floor transfer w/SBA for improved safety at home and fall recovery  Baseline:  Goal status: MET   ASSESSMENT:  CLINICAL IMPRESSION: Session limited due to high fatigue levels and pt requesting mat-based exercises. Pt's  vitals WNL and SpO2 maintained >98% on RA throughout session. Pt has had a fall since last visit due to turning too quickly. Pt continues to be limited by bilateral hip abductor weakness (LLE>RLE), pain in ribs and fatigue. Pt has met 2 of 3 STGs thus far, improving her time on 5x STS w/improved technique and completing FGA assessment. Continue POC.    OBJECTIVE IMPAIRMENTS: Abnormal gait, decreased activity tolerance, decreased balance, decreased coordination, decreased endurance, difficulty walking, decreased strength, dizziness, impaired sensation, improper body mechanics, and pain   ACTIVITY LIMITATIONS: carrying, lifting, squatting, sleeping, stairs, transfers, bed mobility, reach over head, and locomotion level  PARTICIPATION LIMITATIONS: cleaning, laundry, driving, shopping, community activity, and yard work  PERSONAL FACTORS: Past/current experiences and  1-2 comorbidities: Recent ACDF and chronic R sided sciatica are also affecting patient's functional outcome.   REHAB POTENTIAL: Good  CLINICAL DECISION MAKING: Evolving/moderate complexity  EVALUATION COMPLEXITY: Moderate  PLAN:  PT FREQUENCY: 2x/week  PT DURATION: 6 weeks  PLANNED INTERVENTIONS: 97164- PT Re-evaluation, 97750- Physical Performance Testing, 97110-Therapeutic exercises, 97530- Therapeutic activity, 97112- Neuromuscular re-education, 97535- Self Care, [id number]- Manual therapy, [phone]- Gait training, [phone]- Electrical stimulation (manual), Patient/Family education, Balance training, Stair training, Dry Needling, Joint mobilization, Spinal mobilization, Scar mobilization, Vestibular training, and DME instructions  PLAN FOR NEXT SESSION: check goals. Review previous HEP and update prn. Monitor vitals. Trial Bioness on RLE. Ankle stability, dynadiscs, river rocks, treadmill    [employer], PT, DPT 05/17/2023, 1:15 PM

## 2023-05-19 ENCOUNTER — Ambulatory Visit: Admitting: Physical Therapy

## 2023-05-19 VITALS — BP 112/63 | HR 63

## 2023-05-19 DIAGNOSIS — R2681 Unsteadiness on feet: Secondary | ICD-10-CM

## 2023-05-19 DIAGNOSIS — R2689 Other abnormalities of gait and mobility: Secondary | ICD-10-CM

## 2023-05-19 DIAGNOSIS — R296 Repeated falls: Secondary | ICD-10-CM

## 2023-05-19 DIAGNOSIS — M6281 Muscle weakness (generalized): Secondary | ICD-10-CM | POA: Diagnosis not present

## 2023-05-19 DIAGNOSIS — M5431 Sciatica, right side: Secondary | ICD-10-CM | POA: Diagnosis not present

## 2023-05-19 DIAGNOSIS — M542 Cervicalgia: Secondary | ICD-10-CM | POA: Diagnosis not present

## 2023-05-19 NOTE — Therapy (Signed)
 OUTPATIENT PHYSICAL THERAPY NEURO TREATMENT   Patient Name: Cristina Wilson MRN: [id number] DOB:[birth year], 73 y.o., female Today's Date: 05/19/2023   PCP: Alvie Ax, MD REFERRING PROVIDER: Ginnie Laine, MD  END OF SESSION:  PT End of Session - 05/19/23 1238     Visit Number 9    Number of Visits 13    Date for PT Re-Evaluation 06/06/23    Authorization Type [employer]    PT Start Time 1235   Pt arrived late   PT Stop Time 1315    PT Time Calculation (min) 40 min    Equipment Utilized During Treatment Gait belt   Foot-up brace   Activity Tolerance Patient tolerated treatment well    Behavior During Therapy WFL for tasks assessed/performed                Past Medical History:  Diagnosis Date   Abdominal pain 06/06/2018   Acute renal failure (ARF) (HCC) 06/22/2018   Anemia in chronic kidney disease 08/25/2018   Anemia, unspecified 07/05/2018   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 states she has 2 aneurysms   Chronic low back pain 04/12/2017   Chronic pain syndrome 04/12/2017   Closed fracture of left distal radius    De Quervain's tenosynovitis 02/10/2018   Degeneration of lumbar intervertebral disc 03/01/2017   End stage renal failure on dialysis [facility])    M/W/F on [employer]   GERD (gastroesophageal reflux disease)    Goodpasture syndrome (HCC)    Headache 10/30/2014   Hyperlipidemia 04/17/2018   Hypertension    Hypothyroidism    Iron deficiency anemia, unspecified 07/12/2018   PONV (postoperative nausea and vomiting)    violent vomiting   Sacral back pain 05/25/2016   Scoliosis of lumbar spine 03/01/2017   Secondary hyperparathyroidism of renal origin (HCC) 08/22/2018   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   Thyroid  disease    Trigger finger of left thumb 02/10/2018   Past Surgical History:  Procedure Laterality  Date   ARTERY BIOPSY Right 10/31/2014   Procedure: BIOPSY TEMPORAL ARTERY-RIGHT;  Surgeon: Palma Bob, MD;  Location: [facility] OR;  Service: Vascular;  Laterality: Right;   AV FISTULA PLACEMENT Left 10/11/2018   Procedure: left arm ARTERIOVENOUS (AV) FISTULA  creation;  Surgeon: Adine Hoof, MD;  Location: [facility] OR;  Service: Vascular;  Laterality: Left;   BILATERAL CARPAL TUNNEL RELEASE     BIOPSY  04/29/2019   Procedure: BIOPSY;  Surgeon: Felecia Hopper, MD;  Location: [facility];  Service: Gastroenterology;;   BIOPSY  11/05/2019   Procedure: BIOPSY;  Surgeon: Felecia Hopper, MD;  Location: [facility];  Service: Gastroenterology;;   BREAST SURGERY Left    lumpectomy   BUNIONECTOMY Right    CHOLECYSTECTOMY N/A 10/13/2016   Procedure: LAPAROSCOPIC CHOLECYSTECTOMY WITH INTRAOPERATIVE CHOLANGIOGRAM;  Surgeon: Dareen Ebbing, MD;  Location: [facility] OR;  Service: General;  Laterality: N/A;   COLONOSCOPY WITH PROPOFOL  N/A 11/05/2019   Procedure: COLONOSCOPY WITH PROPOFOL ;  Surgeon: Felecia Hopper, MD;  Location: [facility];  Service: Gastroenterology;  Laterality: N/A;   ESOPHAGOGASTRODUODENOSCOPY (EGD) WITH PROPOFOL  N/A 04/29/2019   Procedure: ESOPHAGOGASTRODUODENOSCOPY (EGD) WITH PROPOFOL ;  Surgeon: Felecia Hopper, MD;  Location: [facility];  Service: Gastroenterology;  Laterality: N/A;   EYE SURGERY     surgery for cross eye   FOOT FRACTURE SURGERY  OPEN REDUCTION INTERNAL FIXATION (ORIF) DISTAL RADIAL FRACTURE Left 03/29/2017   Procedure: OPEN REDUCTION INTERNAL FIXATION (ORIF)LEFT  DISTAL RADIAL FRACTURE;  Surgeon: Brunilda Capra, MD;  Location: [facility];  Service: Orthopedics;  Laterality: Left;   POLYPECTOMY  11/05/2019   Procedure: POLYPECTOMY;  Surgeon: Felecia Hopper, MD;  Location: [facility];  Service: Gastroenterology;;   THYROIDECTOMY     TONSILLECTOMY     WISDOM TOOTH EXTRACTION     Patient Active Problem List   Diagnosis Date Noted    Hypertensive crisis 10/23/2019   History of cerebral aneurysm 10/23/2019   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   Hypoxemia 02/21/2019   Pulmonary edema 02/20/2019   Encounter for immunization 09/26/2018   Anemia in chronic kidney disease 08/25/2018   End stage renal disease (HCC) 08/25/2018   Secondary hyperparathyroidism of renal origin (HCC) 08/22/2018   Referred otalgia of left ear 08/16/2018   Iron deficiency anemia, unspecified 07/12/2018   Shortness of breath 07/06/2018   Anemia, unspecified 07/05/2018   Anxiety 07/05/2018   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   Acute renal failure (ARF) (HCC) 06/22/2018   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   Abdominal pain 06/06/2018   Hydronephrosis due to obstruction of ureteral orifice 06/06/2018   Disease of thyroid  gland 04/17/2018   Hyperlipidemia 04/17/2018   Hypertension 04/17/2018   De Quervain's tenosynovitis 02/10/2018   Trigger finger of left thumb 02/10/2018   Chronic low back pain 04/12/2017   Chronic pain syndrome 04/12/2017   Degeneration of lumbar intervertebral disc 03/01/2017   Scoliosis of lumbar spine 03/01/2017   Sacral back pain 05/25/2016   Eye pain 10/30/2014    ONSET DATE: 04/11/2023 (referral)  REFERRING DIAG: R29.6 (ICD-10-CM) - Repeated falls  THERAPY DIAG:  Unsteadiness on feet  Other abnormalities of gait and mobility  Repeated falls  Muscle weakness (generalized)  Rationale for Evaluation and Treatment: Rehabilitation  SUBJECTIVE:                                                                                                                                                                                             SUBJECTIVE STATEMENT: Pt received outside. Reports feeling much better today. No falls   Pt accompanied by: self  PERTINENT HISTORY: hypothyroidism; HTN; HLD; Goodpasture syndrome; cerebral aneurysm; ESRD on TTS HD; chronic pain; ACDF  on 03/08/23  PAIN:  Are you having pain? Yes: NPRS scale: 6-7/10 Pain location: RLE, neck   Pain description: Achy/throbbing   Aggravating factors: Sitting for too long  Relieving factors: Tramadol   PRECAUTIONS: Cervical, Fall, and Other: 2 broken ribs on R side  (  follows up w Dr. Ellery Guthrie on 5/2)   RED FLAGS: Cervical red flags: Dysphagia No, Dysmetria No, Diplopia No, Nystagmus No, and Nausea No   WEIGHT BEARING RESTRICTIONS: No  FALLS: Has patient fallen in last 6 months? Yes. Number of falls 3  LIVING ENVIRONMENT: Lives with: lives alone Lives in: House/apartment Stairs: Yes: External: 2 in back, 4-5 in front steps; No rails in back, banister in front Has following equipment at home: Single point cane and Walker - 4 wheeled  PLOF: Independent  PATIENT GOALS: "I don't wanna walk like I feel like I am gonna fall and wobble wobble"   OBJECTIVE:  Note: Objective measures were completed at Evaluation unless otherwise noted.  DIAGNOSTIC FINDINGS: CT of chest from 04/02/23  IMPRESSION: Healing posterior right ninth and tenth rib fractures which are nondisplaced. No hemothorax or pneumothorax.  COGNITION: Overall cognitive status: Within functional limits for tasks assessed   SENSATION: Impaired sensation in RUE and sciatica in RLE   POSTURE: rounded shoulders, forward head, and increased thoracic kyphosis  LOWER EXTREMITY ROM:     Active  Right Eval Left Eval  Hip flexion    Hip extension    Hip abduction    Hip adduction    Hip internal rotation    Hip external rotation    Knee flexion    Knee extension    Ankle dorsiflexion    Ankle plantarflexion    Ankle inversion    Ankle eversion     (Blank rows = not tested)  LOWER EXTREMITY MMT:  Tested in seated position   MMT Right Eval Left Eval  Hip flexion 4- 4  Hip extension    Hip abduction 4+ 4+  Hip adduction 4+ 4+  Hip internal rotation    Hip external rotation    Knee flexion 4+ 4+  Knee  extension 4+ 4+  Ankle dorsiflexion 4 4  Ankle plantarflexion    Ankle inversion    Ankle eversion    (Blank rows = not tested)  BED MOBILITY:  Not tested Pt reports pain w/this due to rib pain   TRANSFERS: Sit to stand: SBA  Assistive device utilized: None     Stand to sit: SBA  Assistive device utilized: None       GAIT Gait pattern: step through pattern, decreased hip/knee flexion- Right, decreased ankle dorsiflexion- Right, lateral hip instability, wide BOS, and poor foot clearance- Right Distance walked: Various clinic distances + >500' outside  Assistive device utilized: Foot-up brace Level of assistance: Modified independence and SBA Comments: Pt received from front parking lot and ambulated around building to back door to enter gym. Pt w/a few stumbles on incline outside (catching R foot), requiring CGA.    PATIENT SURVEYS:  ABC scale 47.5%   VITALS  Vitals:   05/19/23 1241  BP: 112/63  Pulse: 63  TREATMENT:    Self-care Assessed vitals (see above) and within limits for session.   Ther Act  The following treadmill training was completed for aerobic/neural priming, endurance, and gait speed.  - Warmup: 3:00 up to 2.1 mph at 3% incline  - HIIT: 3:00 45 sec ON 15 sec OFF alternating 2.1 and 3.2 mph. BUE support throughout and noted good step clearance this date - cool down: 2:30 at 2.1 mph. Pt reported feeling very short of breath but not lightheaded or dizzy  Sit to stands squeezing yoga block between knees for improved hip IR, hip adductor strength and stability w/narrow BOS. Pt performed 3x8 reps w/intermittent UE support and noted significant trembling of L quad throughout.  Seated hip IR drill w/yoga block, 3x7 reps. Noted decreased mobility of LLE > RLE  At ballet bar, calf raises w/0.5kg ball squeezes between heels for improved  functional strength and stability w/TS. Pt reported feeling this more in her L calf than R.   Physical Performance   [facility] PT Assessment - 05/19/23 1257       Ambulation/Gait   Gait velocity 32.8' over 10.12s = 3.24 ft/s no AD      Balance   Balance Assessed --      Standardized Balance Assessment   Standardized Balance Assessment --    10 Meter Walk --               PATIENT EDUCATION: Education details: Continue HEP  Person educated: Patient Education method: Medical illustrator Education comprehension: verbalized understanding and needs further education  HOME EXERCISE PROGRAM: From previous POC:  Access Code: [email or web address]   GOALS: Goals reviewed with patient? Yes  SHORT TERM GOALS: Target date: 05/16/2023   Pt will improve 5 x STS to less than or equal to 16 seconds w/hip-width BOS to demonstrate improved functional strength and transfer efficiency.   Baseline: 19.15s w/wide BOS and no UE support; 9.9s no UE support  Goal status: MET  2.  FGA to be assessed and LTG updated  Baseline:  Goal status: MET  3.  Pt will improve gait velocity to at least 3.1 ft/s for improved gait efficiency and independence   Baseline: 3.24 ft/s (5/15)  Goal status: MET   LONG TERM GOALS: Target date: 05/30/2023   Pt will improve FGA to 25/30 for decreased fall risk   Baseline: 19/30 Goal status: REVISED  2.  Pt will score >/= 60% on ABC scale for improved self-confidence and independence  Baseline: 47.5%  Goal status: INITIAL  3.  Pt will improve 5 x STS to less than or equal to 12 seconds w/proper body mechanics to demonstrate improved functional strength and transfer efficiency.   Baseline: 19.15s w/wide BOS; 9.9s w/no UE support (5/13)  Goal status: MET  4.  Pt will improve gait velocity to at least 3.4 ft/s for improved gait efficiency and independence   Baseline: 3.24 ft/s (5/15) Goal status: REVISED   5.  Pt will perform floor transfer w/SBA for  improved safety at home and fall recovery  Baseline:  Goal status: MET   ASSESSMENT:  CLINICAL IMPRESSION: Emphasis of skilled PT session on finishing STG assessment, TM training for gait speed, hip IR mobility and functional stability. Pt has met all 3 STGs and is quickly returning to her gait speed prior to ACDF. Pt most limited by decreased cardiovascular endurance but denied lightheadedness or dizziness. Pt demonstrates decreased mobility of L hip as well as reduced stability on LLE. Continue POC.  OBJECTIVE IMPAIRMENTS: Abnormal gait, decreased activity tolerance, decreased balance, decreased coordination, decreased endurance, difficulty walking, decreased strength, dizziness, impaired sensation, improper body mechanics, and pain   ACTIVITY LIMITATIONS: carrying, lifting, squatting, sleeping, stairs, transfers, bed mobility, reach over head, and locomotion level  PARTICIPATION LIMITATIONS: cleaning, laundry, driving, shopping, community activity, and yard work  PERSONAL FACTORS: Past/current experiences and 1-2 comorbidities: Recent ACDF and chronic R sided sciatica are also affecting patient's functional outcome.   REHAB POTENTIAL: Good  CLINICAL DECISION MAKING: Evolving/moderate complexity  EVALUATION COMPLEXITY: Moderate  PLAN:  PT FREQUENCY: 2x/week  PT DURATION: 6 weeks  PLANNED INTERVENTIONS: 97164- PT Re-evaluation, 97750- Physical Performance Testing, 97110-Therapeutic exercises, 97530- Therapeutic activity, 97112- Neuromuscular re-education, 97535- Self Care, 64403- Manual therapy, [phone]- Gait training, [phone]- Electrical stimulation (manual), Patient/Family education, Balance training, Stair training, Dry Needling, Joint mobilization, Spinal mobilization, Scar mobilization, Vestibular training, and DME instructions  PLAN FOR NEXT SESSION: 10th visit. Review previous HEP and update prn. Monitor vitals. Trial Bioness on RLE. Ankle stability, dynadiscs, river rocks,  treadmill    [employer], PT, DPT 05/19/2023, 1:36 PM

## 2023-05-20 DIAGNOSIS — M5416 Radiculopathy, lumbar region: Secondary | ICD-10-CM | POA: Diagnosis not present

## 2023-05-23 ENCOUNTER — Ambulatory Visit: Admitting: Physical Therapy

## 2023-05-23 VITALS — BP 129/68 | HR 53

## 2023-05-23 DIAGNOSIS — R2681 Unsteadiness on feet: Secondary | ICD-10-CM

## 2023-05-23 DIAGNOSIS — M6281 Muscle weakness (generalized): Secondary | ICD-10-CM

## 2023-05-23 DIAGNOSIS — R296 Repeated falls: Secondary | ICD-10-CM | POA: Diagnosis not present

## 2023-05-23 DIAGNOSIS — R2689 Other abnormalities of gait and mobility: Secondary | ICD-10-CM | POA: Diagnosis not present

## 2023-05-23 DIAGNOSIS — M542 Cervicalgia: Secondary | ICD-10-CM | POA: Diagnosis not present

## 2023-05-23 DIAGNOSIS — M5431 Sciatica, right side: Secondary | ICD-10-CM | POA: Diagnosis not present

## 2023-05-23 NOTE — Therapy (Signed)
 OUTPATIENT PHYSICAL THERAPY NEURO TREATMENT- 10TH VISIT PROGRESS NOTE    Patient Name: Cristina Wilson MRN: [id number] DOB:[birth year], 73 y.o., female Today's Date: 05/23/2023   PCP: Alvie Ax, MD REFERRING PROVIDER: Ginnie Laine, MD  Physical Therapy Progress Note   Dates of Reporting Period:04/18/23 - 05/23/23  See Note below for Objective Data and Assessment of Progress/Goals.   END OF SESSION:  PT End of Session - 05/23/23 1025     Visit Number 10    Number of Visits 13    Date for PT Re-Evaluation 06/06/23    Authorization Type [employer]    PT Start Time 1020    PT Stop Time 1100    PT Time Calculation (min) 40 min    Equipment Utilized During Treatment Gait belt   Foot-up brace   Activity Tolerance Patient tolerated treatment well    Behavior During Therapy WFL for tasks assessed/performed                 Past Medical History:  Diagnosis Date   Abdominal pain 06/06/2018   Acute renal failure (ARF) (HCC) 06/22/2018   Anemia in chronic kidney disease 08/25/2018   Anemia, unspecified 07/05/2018   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 states she has 2 aneurysms   Chronic low back pain 04/12/2017   Chronic pain syndrome 04/12/2017   Closed fracture of left distal radius    De Quervain's tenosynovitis 02/10/2018   Degeneration of lumbar intervertebral disc 03/01/2017   End stage renal failure on dialysis [facility])    M/W/F on [employer]   GERD (gastroesophageal reflux disease)    Goodpasture syndrome (HCC)    Headache 10/30/2014   Hyperlipidemia 04/17/2018   Hypertension    Hypothyroidism    Iron deficiency anemia, unspecified 07/12/2018   PONV (postoperative nausea and vomiting)    violent vomiting   Sacral back pain 05/25/2016   Scoliosis of lumbar spine 03/01/2017   Secondary hyperparathyroidism of renal origin (HCC)  08/22/2018   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   Thyroid  disease    Trigger finger of left thumb 02/10/2018   Past Surgical History:  Procedure Laterality Date   ARTERY BIOPSY Right 10/31/2014   Procedure: BIOPSY TEMPORAL ARTERY-RIGHT;  Surgeon: Palma Bob, MD;  Location: [facility] OR;  Service: Vascular;  Laterality: Right;   AV FISTULA PLACEMENT Left 10/11/2018   Procedure: left arm ARTERIOVENOUS (AV) FISTULA  creation;  Surgeon: Adine Hoof, MD;  Location: [facility] OR;  Service: Vascular;  Laterality: Left;   BILATERAL CARPAL TUNNEL RELEASE     BIOPSY  04/29/2019   Procedure: BIOPSY;  Surgeon: Felecia Hopper, MD;  Location: [facility];  Service: Gastroenterology;;   BIOPSY  11/05/2019   Procedure: BIOPSY;  Surgeon: Felecia Hopper, MD;  Location: [facility];  Service: Gastroenterology;;   BREAST SURGERY Left    lumpectomy   BUNIONECTOMY Right    CHOLECYSTECTOMY N/A 10/13/2016   Procedure: LAPAROSCOPIC CHOLECYSTECTOMY WITH INTRAOPERATIVE CHOLANGIOGRAM;  Surgeon: Dareen Ebbing, MD;  Location: [facility] OR;  Service: General;  Laterality: N/A;   COLONOSCOPY WITH PROPOFOL  N/A 11/05/2019   Procedure: COLONOSCOPY WITH PROPOFOL ;  Surgeon: Felecia Hopper, MD;  Location: [facility];  Service: Gastroenterology;  Laterality: N/A;   ESOPHAGOGASTRODUODENOSCOPY (EGD) WITH PROPOFOL  N/A 04/29/2019   Procedure: ESOPHAGOGASTRODUODENOSCOPY (EGD) WITH PROPOFOL ;  Surgeon: Felecia Hopper, MD;  Location: [facility];  Service: Gastroenterology;  Laterality: N/A;   EYE SURGERY     surgery for cross eye   FOOT FRACTURE SURGERY     OPEN REDUCTION INTERNAL FIXATION (ORIF) DISTAL RADIAL FRACTURE Left 03/29/2017   Procedure: OPEN REDUCTION INTERNAL FIXATION (ORIF)LEFT  DISTAL RADIAL FRACTURE;  Surgeon: Brunilda Capra, MD;  Location: [facility];  Service: Orthopedics;  Laterality: Left;   POLYPECTOMY  11/05/2019   Procedure: POLYPECTOMY;  Surgeon: Felecia Hopper, MD;  Location:  [facility];  Service: Gastroenterology;;   THYROIDECTOMY     TONSILLECTOMY     WISDOM TOOTH EXTRACTION     Patient Active Problem List   Diagnosis Date Noted   Hypertensive crisis 10/23/2019   History of cerebral aneurysm 10/23/2019   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   Hypoxemia 02/21/2019   Pulmonary edema 02/20/2019   Encounter for immunization 09/26/2018   Anemia in chronic kidney disease 08/25/2018   End stage renal disease (HCC) 08/25/2018   Secondary hyperparathyroidism of renal origin (HCC) 08/22/2018   Referred otalgia of left ear 08/16/2018   Iron deficiency anemia, unspecified 07/12/2018   Shortness of breath 07/06/2018   Anemia, unspecified 07/05/2018   Anxiety 07/05/2018   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   Acute renal failure (ARF) (HCC) 06/22/2018   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   Abdominal pain 06/06/2018   Hydronephrosis due to obstruction of ureteral orifice 06/06/2018   Disease of thyroid  gland 04/17/2018   Hyperlipidemia 04/17/2018   Hypertension 04/17/2018   De Quervain's tenosynovitis 02/10/2018   Trigger finger of left thumb 02/10/2018   Chronic low back pain 04/12/2017   Chronic pain syndrome 04/12/2017   Degeneration of lumbar intervertebral disc 03/01/2017   Scoliosis of lumbar spine 03/01/2017   Sacral back pain 05/25/2016   Eye pain 10/30/2014    ONSET DATE: 04/11/2023 (referral)  REFERRING DIAG: R29.6 (ICD-10-CM) - Repeated falls  THERAPY DIAG:  Muscle weakness (generalized)  Unsteadiness on feet  Other abnormalities of gait and mobility  Rationale for Evaluation and Treatment: Rehabilitation  SUBJECTIVE:                                                                                                                                                                                             SUBJECTIVE STATEMENT: Pt received outside. Denies acute changes or falls.   Pt  accompanied by: self  PERTINENT HISTORY: hypothyroidism; HTN; HLD; Goodpasture syndrome; cerebral aneurysm; ESRD on TTS HD; chronic pain; ACDF on 03/08/23  PAIN:  Are you having pain? Yes: NPRS scale: 6-7/10 Pain location: RLE, neck   Pain description: Achy/throbbing   Aggravating factors:  Sitting for too long  Relieving factors: Tramadol   PRECAUTIONS: Cervical, Fall, and Other: 2 broken ribs on R side  (follows up w Dr. Ellery Guthrie on 5/2)   RED FLAGS: Cervical red flags: Dysphagia No, Dysmetria No, Diplopia No, Nystagmus No, and Nausea No   WEIGHT BEARING RESTRICTIONS: No  FALLS: Has patient fallen in last 6 months? Yes. Number of falls 3  LIVING ENVIRONMENT: Lives with: lives alone Lives in: House/apartment Stairs: Yes: External: 2 in back, 4-5 in front steps; No rails in back, banister in front Has following equipment at home: Single point cane and Walker - 4 wheeled  PLOF: Independent  PATIENT GOALS: "I don't wanna walk like I feel like I am gonna fall and wobble wobble"   OBJECTIVE:  Note: Objective measures were completed at Evaluation unless otherwise noted.  DIAGNOSTIC FINDINGS: CT of chest from 04/02/23  IMPRESSION: Healing posterior right ninth and tenth rib fractures which are nondisplaced. No hemothorax or pneumothorax.  COGNITION: Overall cognitive status: Within functional limits for tasks assessed   SENSATION: Impaired sensation in RUE and sciatica in RLE   POSTURE: rounded shoulders, forward head, and increased thoracic kyphosis  LOWER EXTREMITY ROM:     Active  Right Eval Left Eval  Hip flexion    Hip extension    Hip abduction    Hip adduction    Hip internal rotation    Hip external rotation    Knee flexion    Knee extension    Ankle dorsiflexion    Ankle plantarflexion    Ankle inversion    Ankle eversion     (Blank rows = not tested)  LOWER EXTREMITY MMT:  Tested in seated position   MMT Right Eval Left Eval  Hip flexion 4- 4  Hip  extension    Hip abduction 4+ 4+  Hip adduction 4+ 4+  Hip internal rotation    Hip external rotation    Knee flexion 4+ 4+  Knee extension 4+ 4+  Ankle dorsiflexion 4 4  Ankle plantarflexion    Ankle inversion    Ankle eversion    (Blank rows = not tested)  BED MOBILITY:  Not tested Pt reports pain w/this due to rib pain   TRANSFERS: Sit to stand: SBA  Assistive device utilized: None     Stand to sit: SBA  Assistive device utilized: None       GAIT Gait pattern: step through pattern, decreased hip/knee flexion- Right, decreased ankle dorsiflexion- Right, lateral hip instability, wide BOS, and poor foot clearance- Right Distance walked: Various clinic distances + >500' outside  Assistive device utilized: Foot-up brace Level of assistance: Modified independence and SBA Comments: Pt received from front parking lot and ambulated around building to back door to enter gym. Pt w/a few stumbles on incline outside (catching R foot), requiring CGA.    PATIENT SURVEYS:  ABC scale 47.5%   VITALS  Vitals:   05/23/23 1027 05/23/23 1028  BP: 137/78 129/68  Pulse: (!) 54 (!) 53  TREATMENT:    Self-care Assessed vitals (see above) and HR on lower end but within limits for session. Continued to monitor throughout session. Pt denied lightheadedness/dizziness.    Ther Act  SciFit multi-peaks level 7.5 for 8 minutes using BLEs only for dynamic cardiovascular warmup and increased amplitude of stepping. RPE of 7-8/10 and HR 58 bpm following activity  Leg presses for improved functional BLE strength and eccentric control:  Double leg, x12 reps at 70# Single leg, x15 reps per side at 50#. Increased difficulty on RLE  Heel elevated squats (using 1.5" foam beam) w/10# KB, x8 reps, for improved quad strength, core stability and endurance. Pt required CGA on final 3  reps due to fatigue and inability to perform movement without excessive truncal flexion.   Pt's HR at 64 bpm at end of session    PATIENT EDUCATION: Education details: Continue HEP  Person educated: Patient Education method: Medical illustrator Education comprehension: verbalized understanding and needs further education  HOME EXERCISE PROGRAM: From previous POC:  Access Code: [email or web address]   GOALS: Goals reviewed with patient? Yes  SHORT TERM GOALS: Target date: 05/16/2023   Pt will improve 5 x STS to less than or equal to 16 seconds w/hip-width BOS to demonstrate improved functional strength and transfer efficiency.   Baseline: 19.15s w/wide BOS and no UE support; 9.9s no UE support  Goal status: MET  2.  FGA to be assessed and LTG updated  Baseline:  Goal status: MET  3.  Pt will improve gait velocity to at least 3.1 ft/s for improved gait efficiency and independence   Baseline: 3.24 ft/s (5/15)  Goal status: MET   LONG TERM GOALS: Target date: 05/30/2023   Pt will improve FGA to 25/30 for decreased fall risk   Baseline: 19/30 Goal status: REVISED  2.  Pt will score >/= 60% on ABC scale for improved self-confidence and independence  Baseline: 47.5%  Goal status: INITIAL  3.  Pt will improve 5 x STS to less than or equal to 12 seconds w/proper body mechanics to demonstrate improved functional strength and transfer efficiency.   Baseline: 19.15s w/wide BOS; 9.9s w/no UE support (5/13)  Goal status: MET  4.  Pt will improve gait velocity to at least 3.4 ft/s for improved gait efficiency and independence   Baseline: 3.24 ft/s (5/15) Goal status: REVISED   5.  Pt will perform floor transfer w/SBA for improved safety at home and fall recovery  Baseline:  Goal status: MET   ASSESSMENT:  CLINICAL IMPRESSION: Emphasis of skilled PT session on functional BLE strength and endurance. Pt's HR on lower end at beginning of session, but did improve by end w/no  report of lightheadedness/dizziness. Pt tolerated session well but continues to be limited by functional hip weakness of BLEs, R sided sciatica and R sided rib pain from recent fx. Pt overall is more stable w/gait, w/no falls in past 2 weeks and continues to work on her endurance. Pt is progressing well towards LTGs. Continue POC.    OBJECTIVE IMPAIRMENTS: Abnormal gait, decreased activity tolerance, decreased balance, decreased coordination, decreased endurance, difficulty walking, decreased strength, dizziness, impaired sensation, improper body mechanics, and pain   ACTIVITY LIMITATIONS: carrying, lifting, squatting, sleeping, stairs, transfers, bed mobility, reach over head, and locomotion level  PARTICIPATION LIMITATIONS: cleaning, laundry, driving, shopping, community activity, and yard work  PERSONAL FACTORS: Past/current experiences and 1-2 comorbidities: Recent ACDF and chronic R sided sciatica are also affecting patient's functional outcome.   REHAB POTENTIAL:  Good  CLINICAL DECISION MAKING: Evolving/moderate complexity  EVALUATION COMPLEXITY: Moderate  PLAN:  PT FREQUENCY: 2x/week  PT DURATION: 6 weeks  PLANNED INTERVENTIONS: 97164- PT Re-evaluation, 97750- Physical Performance Testing, 97110-Therapeutic exercises, 97530- Therapeutic activity, 97112- Neuromuscular re-education, 97535- Self Care, 25956- Manual therapy, [phone]- Gait training, [phone]- Electrical stimulation (manual), Patient/Family education, Balance training, Stair training, Dry Needling, Joint mobilization, Spinal mobilization, Scar mobilization, Vestibular training, and DME instructions  PLAN FOR NEXT SESSION: Review previous HEP and update prn. Monitor vitals. Trial Bioness on RLE. Ankle stability, dynadiscs, river rocks, treadmill    [employer], PT, DPT 05/23/2023, 11:03 AM

## 2023-05-25 ENCOUNTER — Ambulatory Visit: Admitting: Physical Therapy

## 2023-05-25 VITALS — BP 132/89 | HR 63

## 2023-05-25 DIAGNOSIS — M542 Cervicalgia: Secondary | ICD-10-CM | POA: Diagnosis not present

## 2023-05-25 DIAGNOSIS — R296 Repeated falls: Secondary | ICD-10-CM

## 2023-05-25 DIAGNOSIS — R2681 Unsteadiness on feet: Secondary | ICD-10-CM

## 2023-05-25 DIAGNOSIS — M6281 Muscle weakness (generalized): Secondary | ICD-10-CM | POA: Diagnosis not present

## 2023-05-25 DIAGNOSIS — M5431 Sciatica, right side: Secondary | ICD-10-CM

## 2023-05-25 DIAGNOSIS — R2689 Other abnormalities of gait and mobility: Secondary | ICD-10-CM | POA: Diagnosis not present

## 2023-05-25 NOTE — Therapy (Addendum)
 OUTPATIENT PHYSICAL THERAPY NEURO TREATMENT   Patient Name: Cristina Wilson MRN: [id number] DOB:11-05-1950, 73 y.o., female Today's Date: 05/25/2023   PCP: Alvie Ax, MD REFERRING PROVIDER: Ginnie Laine, MD    END OF SESSION:  PT End of Session - 05/25/23 1234     Visit Number 11    Number of Visits 13    Date for PT Re-Evaluation 06/06/23    Authorization Type [employer]    PT Start Time 1233    PT Stop Time 1313    PT Time Calculation (min) 40 min    Equipment Utilized During Treatment Gait belt   Foot-up brace   Activity Tolerance Patient tolerated treatment well    Behavior During Therapy Impulsive                  Past Medical History:  Diagnosis Date   Abdominal pain 06/06/2018   Acute renal failure (ARF) (HCC) 06/22/2018   Anemia in chronic kidney disease 08/25/2018   Anemia, unspecified 07/05/2018   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 states she has 2 aneurysms   Chronic low back pain 04/12/2017   Chronic pain syndrome 04/12/2017   Closed fracture of left distal radius    De Quervain's tenosynovitis 02/10/2018   Degeneration of lumbar intervertebral disc 03/01/2017   End stage renal failure on dialysis [facility])    M/W/F on [employer]   GERD (gastroesophageal reflux disease)    Goodpasture syndrome (HCC)    Headache 10/30/2014   Hyperlipidemia 04/17/2018   Hypertension    Hypothyroidism    Iron deficiency anemia, unspecified 07/12/2018   PONV (postoperative nausea and vomiting)    violent vomiting   Sacral back pain 05/25/2016   Scoliosis of lumbar spine 03/01/2017   Secondary hyperparathyroidism of renal origin (HCC) 08/22/2018   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 OR;  Service: Vascular;  Laterality: Left;   BILATERAL CARPAL TUNNEL RELEASE     BIOPSY  04/29/2019   Procedure: BIOPSY;  Surgeon: Felecia Hopper, MD;  Location: [facility];  Service: Gastroenterology;;   BIOPSY  11/05/2019   Procedure: BIOPSY;  Surgeon: Felecia Hopper, MD;  Location: [facility];  Service: Gastroenterology;;   BREAST SURGERY Left    lumpectomy   BUNIONECTOMY Right    CHOLECYSTECTOMY N/A 10/13/2016   Procedure: LAPAROSCOPIC CHOLECYSTECTOMY WITH INTRAOPERATIVE CHOLANGIOGRAM;  Surgeon: Dareen Ebbing, MD;  Location: [facility] OR;  Service: General;  Laterality: N/A;   COLONOSCOPY WITH PROPOFOL  N/A 11/05/2019   Procedure: COLONOSCOPY WITH PROPOFOL ;  Surgeon: Felecia Hopper, MD;  Location: [facility];  Service: Gastroenterology;  Laterality: N/A;   ESOPHAGOGASTRODUODENOSCOPY (EGD) WITH PROPOFOL  N/A 04/29/2019   Procedure: ESOPHAGOGASTRODUODENOSCOPY (EGD) WITH PROPOFOL ;  Surgeon: Felecia Hopper, MD;  Location: [facility];  Service: Gastroenterology;  Laterality: N/A;   EYE SURGERY     surgery for cross eye   FOOT FRACTURE SURGERY     OPEN REDUCTION INTERNAL  FIXATION (ORIF) DISTAL RADIAL FRACTURE Left 03/29/2017   Procedure: OPEN REDUCTION INTERNAL FIXATION (ORIF)LEFT  DISTAL RADIAL FRACTURE;  Surgeon: Brunilda Capra, MD;  Location: [facility];  Service: Orthopedics;  Laterality: Left;   POLYPECTOMY  11/05/2019   Procedure: POLYPECTOMY;  Surgeon: Felecia Hopper, MD;  Location: [facility];  Service: Gastroenterology;;   THYROIDECTOMY     TONSILLECTOMY     WISDOM TOOTH EXTRACTION     Patient Active Problem List   Diagnosis Date Noted   Hypertensive crisis  10/23/2019   History of cerebral aneurysm 10/23/2019   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   Hypoxemia 02/21/2019   Pulmonary edema 02/20/2019   Encounter for immunization 09/26/2018   Anemia in chronic kidney disease 08/25/2018   End stage renal disease (HCC) 08/25/2018   Secondary hyperparathyroidism of renal origin (HCC) 08/22/2018   Referred otalgia of left ear 08/16/2018   Iron deficiency anemia, unspecified 07/12/2018   Shortness of breath 07/06/2018   Anemia, unspecified 07/05/2018   Anxiety 07/05/2018   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   Acute renal failure (ARF) (HCC) 06/22/2018   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   Abdominal pain 06/06/2018   Hydronephrosis due to obstruction of ureteral orifice 06/06/2018   Disease of thyroid  gland 04/17/2018   Hyperlipidemia 04/17/2018   Hypertension 04/17/2018   De Quervain's tenosynovitis 02/10/2018   Trigger finger of left thumb 02/10/2018   Chronic low back pain 04/12/2017   Chronic pain syndrome 04/12/2017   Degeneration of lumbar intervertebral disc 03/01/2017   Scoliosis of lumbar spine 03/01/2017   Sacral back pain 05/25/2016   Eye pain 10/30/2014    ONSET DATE: 04/11/2023 (referral)  REFERRING DIAG: R29.6 (ICD-10-CM) - Repeated falls  THERAPY DIAG:  Unsteadiness on feet  Muscle weakness (generalized)  Repeated falls  Sciatica, right side  Rationale for Evaluation and Treatment: Rehabilitation  SUBJECTIVE:                                                                                                                                                                                             SUBJECTIVE STATEMENT: Pt reports doing well. No falls.   Pt accompanied by: self  PERTINENT HISTORY: hypothyroidism; HTN; HLD; Goodpasture syndrome; cerebral aneurysm; ESRD on TTS HD; chronic pain; ACDF on 03/08/23  PAIN:  Are you having pain? Yes: NPRS scale: 9/10 Pain  location: RLE Pain description: Achy/throbbing   Aggravating factors: Sitting for too long  Relieving factors: Tramadol   PRECAUTIONS: Cervical, Fall, and Other: 2 broken ribs on R side  (follows up w Dr. Ellery Guthrie on 5/2)   RED FLAGS: Cervical red  flags: Dysphagia No, Dysmetria No, Diplopia No, Nystagmus No, and Nausea No   WEIGHT BEARING RESTRICTIONS: No  FALLS: Has patient fallen in last 6 months? Yes. Number of falls 3  LIVING ENVIRONMENT: Lives with: lives alone Lives in: House/apartment Stairs: Yes: External: 2 in back, 4-5 in front steps; No rails in back, banister in front Has following equipment at home: Single point cane and Walker - 4 wheeled  PLOF: Independent  PATIENT GOALS: "I don't wanna walk like I feel like I am gonna fall and wobble wobble"   OBJECTIVE:  Note: Objective measures were completed at Evaluation unless otherwise noted.  DIAGNOSTIC FINDINGS: CT of chest from 04/02/23  IMPRESSION: Healing posterior right ninth and tenth rib fractures which are nondisplaced. No hemothorax or pneumothorax.  COGNITION: Overall cognitive status: Within functional limits for tasks assessed   SENSATION: Impaired sensation in RUE and sciatica in RLE   POSTURE: rounded shoulders, forward head, and increased thoracic kyphosis  LOWER EXTREMITY ROM:     Active  Right Eval Left Eval  Hip flexion    Hip extension    Hip abduction    Hip adduction    Hip internal rotation    Hip external rotation    Knee flexion    Knee extension    Ankle dorsiflexion    Ankle plantarflexion    Ankle inversion    Ankle eversion     (Blank rows = not tested)  LOWER EXTREMITY MMT:  Tested in seated position   MMT Right Eval Left Eval  Hip flexion 4- 4  Hip extension    Hip abduction 4+ 4+  Hip adduction 4+ 4+  Hip internal rotation    Hip external rotation    Knee flexion 4+ 4+  Knee extension 4+ 4+  Ankle dorsiflexion 4 4  Ankle plantarflexion    Ankle inversion     Ankle eversion    (Blank rows = not tested)  BED MOBILITY:  Not tested Pt reports pain w/this due to rib pain   TRANSFERS: Sit to stand: SBA  Assistive device utilized: None     Stand to sit: SBA  Assistive device utilized: None       GAIT Gait pattern: step through pattern, decreased hip/knee flexion- Right, decreased ankle dorsiflexion- Right, lateral hip instability, wide BOS, and poor foot clearance- Right Distance walked: Various clinic distances + >500' outside  Assistive device utilized: Foot-up brace Level of assistance: Modified independence and SBA Comments: Pt received from front parking lot and ambulated around building to back door to enter gym. Pt w/a few stumbles on incline outside (catching R foot), requiring CGA.    PATIENT SURVEYS:  ABC scale 47.5%   VITALS  Vitals:   05/25/23 1236  BP: 132/89  Pulse: 63  TREATMENT:    Self-care Assessed vitals (see above) and WNL.  Ther Act  The following treadmill training was completed for aerobic/neural priming, endurance, and gait speed.  - Warmup: 3:00 up to 2.0 mph at 4% incline w/no UE support  - HIIT: 4:00 30sec ON/OFF alternating 2.0 / 2.5 mph w/BUE support. Improved step clearance and cadence noted this date  - Cool down: 1:00 at 2.0 mph   -Provided short standing rest break and progressed to retro gait on TM for improved posterior chain strength and LE coordination:  - 4:00 up to 0.7 mph w/BUE support. Noted very short step length and clearance bilaterally, pt reports she was "trying to find my rhythm". CGA due to instability. Pt requesting to increase speed of TM, but PT declined as pt unstable at current speed. Impulsively, pt reached for speed control and increased speed to 0.9 mph and lost balance posteriorly, requiring mod A and emergency stop button to prevent fall. Lengthy  education regarding importance of safety on equipment, listening to therapist and how pt's impulsivity has led to several falls at home. Pt verbalized understanding. Informed pt we will not be using treadmill in future due to safety concerns.     In // bars, alt reverse lunge off airex w/BUE support, x12 reps per side w/5s isometric hold for improved single leg stability and functional hip strength. Min cues for upright posture throughout.   On mat table, half kneel w/UE support on bench performing 2# dowel chest presses, x15 reps per side, for improved functional hip strength and proximal stability. Pt more challenged on LLE > RLE  Standing goodmornings w/PVC, x20 reps, for posterior chain strength and mobility. Noted trembling of LLE throughout and weight shift to R  Pt rated pain as 6/10 following session    PATIENT EDUCATION: Education details: Continue HEP, safety concerns and how impulsive behavior affects her fall risk  Person educated: Patient Education method: Medical illustrator Education comprehension: verbalized understanding and needs further education  HOME EXERCISE PROGRAM: From previous POC:  Access Code: [email or web address]   GOALS: Goals reviewed with patient? Yes  SHORT TERM GOALS: Target date: 05/16/2023   Pt will improve 5 x STS to less than or equal to 16 seconds w/hip-width BOS to demonstrate improved functional strength and transfer efficiency.   Baseline: 19.15s w/wide BOS and no UE support; 9.9s no UE support  Goal status: MET  2.  FGA to be assessed and LTG updated  Baseline:  Goal status: MET  3.  Pt will improve gait velocity to at least 3.1 ft/s for improved gait efficiency and independence   Baseline: 3.24 ft/s (5/15)  Goal status: MET   LONG TERM GOALS: Target date: 05/30/2023   Pt will improve FGA to 25/30 for decreased fall risk   Baseline: 19/30 Goal status: REVISED  2.  Pt will score >/= 60% on ABC scale for improved self-confidence and  independence  Baseline: 47.5%  Goal status: INITIAL  3.  Pt will improve 5 x STS to less than or equal to 12 seconds w/proper body mechanics to demonstrate improved functional strength and transfer efficiency.   Baseline: 19.15s w/wide BOS; 9.9s w/no UE support (5/13)  Goal status: MET  4.  Pt will improve gait velocity to at least 3.4 ft/s for improved gait efficiency and independence   Baseline: 3.24 ft/s (5/15) Goal status: REVISED   5.  Pt will perform floor transfer w/SBA for improved safety at home and fall recovery  Baseline:  Goal status: MET   ASSESSMENT:  CLINICAL IMPRESSION: Emphasis of skilled PT session on increased gait speed and step clearance on TM, posterior chain strength and proximal stability. Pt had a near miss on treadmill today due to impulsively increasing speed of treadmill despite therapist telling her no, requiring mod A and emergency stop button to prevent fall. Pt informed of safety concerns getting back onto treadmill as pt not compliant w/therapist's instructions and will not be using TM moving forward. Pt has a history of impulsive behavior that has contributed to her falls at home, which pt acknowledges. Pt continues to be limited by functional hip weakness and severe sciatica pain in RLE but overall has made improvements since starting PT. Continue POC.    OBJECTIVE IMPAIRMENTS: Abnormal gait, decreased activity tolerance, decreased balance, decreased coordination, decreased endurance, difficulty walking, decreased strength, dizziness, impaired sensation, improper body mechanics, and pain   ACTIVITY LIMITATIONS: carrying, lifting, squatting, sleeping, stairs, transfers, bed mobility, reach over head, and locomotion level  PARTICIPATION LIMITATIONS: cleaning, laundry, driving, shopping, community activity, and yard work  PERSONAL FACTORS: Past/current experiences and 1-2 comorbidities: Recent ACDF and chronic R sided sciatica are also affecting  patient's functional outcome.   REHAB POTENTIAL: Good  CLINICAL DECISION MAKING: Evolving/moderate complexity  EVALUATION COMPLEXITY: Moderate  PLAN:  PT FREQUENCY: 2x/week  PT DURATION: 6 weeks  PLANNED INTERVENTIONS: 97164- PT Re-evaluation, 97750- Physical Performance Testing, 97110-Therapeutic exercises, 97530- Therapeutic activity, 97112- Neuromuscular re-education, 97535- Self Care, [id number]- Manual therapy, [phone]- Gait training, [phone]- Electrical stimulation (manual), Patient/Family education, Balance training, Stair training, Dry Needling, Joint mobilization, Spinal mobilization, Scar mobilization, Vestibular training, and DME instructions  PLAN FOR NEXT SESSION: Review previous HEP and update prn. Monitor vitals. Trial Bioness on RLE. Ankle stability, dynadiscs, river rocks, cone drill    Doyce Saling E Hadeel Hillebrand, PT, DPT 05/25/2023, 1:41 PM

## 2023-05-26 DIAGNOSIS — I10 Essential (primary) hypertension: Secondary | ICD-10-CM | POA: Diagnosis not present

## 2023-05-26 DIAGNOSIS — Z940 Kidney transplant status: Secondary | ICD-10-CM | POA: Diagnosis not present

## 2023-05-26 DIAGNOSIS — D849 Immunodeficiency, unspecified: Secondary | ICD-10-CM | POA: Diagnosis not present

## 2023-05-26 DIAGNOSIS — B259 Cytomegaloviral disease, unspecified: Secondary | ICD-10-CM | POA: Diagnosis not present

## 2023-05-31 ENCOUNTER — Ambulatory Visit: Admitting: Physical Therapy

## 2023-05-31 VITALS — BP 128/89 | HR 64

## 2023-05-31 DIAGNOSIS — R2689 Other abnormalities of gait and mobility: Secondary | ICD-10-CM | POA: Diagnosis not present

## 2023-05-31 DIAGNOSIS — M5431 Sciatica, right side: Secondary | ICD-10-CM

## 2023-05-31 DIAGNOSIS — M6281 Muscle weakness (generalized): Secondary | ICD-10-CM | POA: Diagnosis not present

## 2023-05-31 DIAGNOSIS — M542 Cervicalgia: Secondary | ICD-10-CM | POA: Diagnosis not present

## 2023-05-31 DIAGNOSIS — R2681 Unsteadiness on feet: Secondary | ICD-10-CM

## 2023-05-31 DIAGNOSIS — R296 Repeated falls: Secondary | ICD-10-CM | POA: Diagnosis not present

## 2023-05-31 NOTE — Therapy (Signed)
 OUTPATIENT PHYSICAL THERAPY NEURO TREATMENT   Patient Name: Cristina Wilson MRN: [id number] DOB:[birth year], 73 y.o., female Today's Date: 05/31/2023   PCP: Alvie Ax, MD REFERRING PROVIDER: Ginnie Laine, MD    END OF SESSION:  PT End of Session - 05/31/23 1023     Visit Number 12    Number of Visits 13    Date for PT Re-Evaluation 06/06/23    Authorization Type [employer]    PT Start Time 1021   Pt arrived late   PT Stop Time 1101    PT Time Calculation (min) 40 min    Equipment Utilized During Treatment Gait belt   Foot-up brace   Activity Tolerance Patient tolerated treatment well    Behavior During Therapy Impulsive                   Past Medical History:  Diagnosis Date   Abdominal pain 06/06/2018   Acute renal failure (ARF) (HCC) 06/22/2018   Anemia in chronic kidney disease 08/25/2018   Anemia, unspecified 07/05/2018   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 states she has 2 aneurysms   Chronic low back pain 04/12/2017   Chronic pain syndrome 04/12/2017   Closed fracture of left distal radius    De Quervain's tenosynovitis 02/10/2018   Degeneration of lumbar intervertebral disc 03/01/2017   End stage renal failure on dialysis [facility])    M/W/F on [employer]   GERD (gastroesophageal reflux disease)    Goodpasture syndrome (HCC)    Headache 10/30/2014   Hyperlipidemia 04/17/2018   Hypertension    Hypothyroidism    Iron deficiency anemia, unspecified 07/12/2018   PONV (postoperative nausea and vomiting)    violent vomiting   Sacral back pain 05/25/2016   Scoliosis of lumbar spine 03/01/2017   Secondary hyperparathyroidism of renal origin (HCC) 08/22/2018   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   Thyroid  disease    Trigger finger of left thumb 02/10/2018   Past Surgical History:  Procedure Laterality Date   ARTERY  BIOPSY Right 10/31/2014   Procedure: BIOPSY TEMPORAL ARTERY-RIGHT;  Surgeon: Palma Bob, MD;  Location: [facility] OR;  Service: Vascular;  Laterality: Right;   AV FISTULA PLACEMENT Left 10/11/2018   Procedure: left arm ARTERIOVENOUS (AV) FISTULA  creation;  Surgeon: Adine Hoof, MD;  Location: [facility] OR;  Service: Vascular;  Laterality: Left;   BILATERAL CARPAL TUNNEL RELEASE     BIOPSY  04/29/2019   Procedure: BIOPSY;  Surgeon: Felecia Hopper, MD;  Location: [facility] ENDOSCOPY;  Service: Gastroenterology;;   BIOPSY  11/05/2019   Procedure: BIOPSY;  Surgeon: Felecia Hopper, MD;  Location: [facility] ENDOSCOPY;  Service: Gastroenterology;;   BREAST SURGERY Left    lumpectomy   BUNIONECTOMY Right    CHOLECYSTECTOMY N/A 10/13/2016   Procedure: LAPAROSCOPIC CHOLECYSTECTOMY WITH INTRAOPERATIVE CHOLANGIOGRAM;  Surgeon: Dareen Ebbing, MD;  Location: [facility] OR;  Service: General;  Laterality: N/A;   COLONOSCOPY WITH PROPOFOL  N/A 11/05/2019   Procedure: COLONOSCOPY WITH PROPOFOL ;  Surgeon: Felecia Hopper, MD;  Location: [facility];  Service: Gastroenterology;  Laterality: N/A;   ESOPHAGOGASTRODUODENOSCOPY (EGD) WITH PROPOFOL  N/A 04/29/2019   Procedure: ESOPHAGOGASTRODUODENOSCOPY (EGD) WITH PROPOFOL ;  Surgeon: Felecia Hopper, MD;  Location: [facility] ENDOSCOPY;  Service: Gastroenterology;  Laterality: N/A;   EYE SURGERY     surgery for cross eye   FOOT FRACTURE SURGERY  OPEN REDUCTION INTERNAL FIXATION (ORIF) DISTAL RADIAL FRACTURE Left 03/29/2017   Procedure: OPEN REDUCTION INTERNAL FIXATION (ORIF)LEFT  DISTAL RADIAL FRACTURE;  Surgeon: Brunilda Capra, MD;  Location:  SURGERY CENTER;  Service: Orthopedics;  Laterality: Left;   POLYPECTOMY  11/05/2019   Procedure: POLYPECTOMY;  Surgeon: Felecia Hopper, MD;  Location: [facility]OSCOPY;  Service: Gastroenterology;;   THYROIDECTOMY     TONSILLECTOMY     WISDOM TOOTH EXTRACTION     Patient Active Problem List   Diagnosis Date Noted   Hypertensive  crisis 10/23/2019   History of cerebral aneurysm 10/23/2019   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   Hypoxemia 02/21/2019   Pulmonary edema 02/20/2019   Encounter for immunization 09/26/2018   Anemia in chronic kidney disease 08/25/2018   End stage renal disease (HCC) 08/25/2018   Secondary hyperparathyroidism of renal origin (HCC) 08/22/2018   Referred otalgia of left ear 08/16/2018   Iron deficiency anemia, unspecified 07/12/2018   Shortness of breath 07/06/2018   Anemia, unspecified 07/05/2018   Anxiety 07/05/2018   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   Acute renal failure (ARF) (HCC) 06/22/2018   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   Abdominal pain 06/06/2018   Hydronephrosis due to obstruction of ureteral orifice 06/06/2018   Disease of thyroid  gland 04/17/2018   Hyperlipidemia 04/17/2018   Hypertension 04/17/2018   De Quervain's tenosynovitis 02/10/2018   Trigger finger of left thumb 02/10/2018   Chronic low back pain 04/12/2017   Chronic pain syndrome 04/12/2017   Degeneration of lumbar intervertebral disc 03/01/2017   Scoliosis of lumbar spine 03/01/2017   Sacral back pain 05/25/2016   Eye pain 10/30/2014    ONSET DATE: 04/11/2023 (referral)  REFERRING DIAG: R29.6 (ICD-10-CM) - Repeated falls  THERAPY DIAG:  Unsteadiness on feet  Muscle weakness (generalized)  Repeated falls  Sciatica, right side  Rationale for Evaluation and Treatment: Rehabilitation  SUBJECTIVE:                                                                                                                                                                                             SUBJECTIVE STATEMENT: Pt presents without AD. States she has had one fall since last session, [place] and tripped over a curb. Bruised up her L knee but otherwise no injuries. Does not feel stable today   Pt accompanied by:  self  PERTINENT HISTORY: hypothyroidism; HTN; HLD; Goodpasture syndrome; cerebral aneurysm; ESRD on TTS HD; chronic pain; ACDF on 03/08/23  PAIN:  Are you having pain? Yes: NPRS scale: 4-5/10 Pain location: RLE Pain description: Achy/throbbing  Aggravating factors: Sitting for too long  Relieving factors: Tramadol   PRECAUTIONS: Cervical, Fall, and Other: 2 broken ribs on R side  (follows up w Dr. Ellery Guthrie on 5/2)   RED FLAGS: Cervical red flags: Dysphagia No, Dysmetria No, Diplopia No, Nystagmus No, and Nausea No   WEIGHT BEARING RESTRICTIONS: No  FALLS: Has patient fallen in last 6 months? Yes. Number of falls 3  LIVING ENVIRONMENT: Lives with: lives alone Lives in: House/apartment Stairs: Yes: External: 2 in back, 4-5 in front steps; No rails in back, banister in front Has following equipment at home: Single point cane and Walker - 4 wheeled  PLOF: Independent  PATIENT GOALS: "I don't wanna walk like I feel like I am gonna fall and wobble wobble"   OBJECTIVE:  Note: Objective measures were completed at Evaluation unless otherwise noted.  DIAGNOSTIC FINDINGS: CT of chest from 04/02/23  IMPRESSION: Healing posterior right ninth and tenth rib fractures which are nondisplaced. No hemothorax or pneumothorax.  COGNITION: Overall cognitive status: Within functional limits for tasks assessed   SENSATION: Impaired sensation in RUE and sciatica in RLE   POSTURE: rounded shoulders, forward head, and increased thoracic kyphosis  LOWER EXTREMITY ROM:     Active  Right Eval Left Eval  Hip flexion    Hip extension    Hip abduction    Hip adduction    Hip internal rotation    Hip external rotation    Knee flexion    Knee extension    Ankle dorsiflexion    Ankle plantarflexion    Ankle inversion    Ankle eversion     (Blank rows = not tested)  LOWER EXTREMITY MMT:  Tested in seated position   MMT Right Eval Left Eval  Hip flexion 4- 4  Hip extension    Hip  abduction 4+ 4+  Hip adduction 4+ 4+  Hip internal rotation    Hip external rotation    Knee flexion 4+ 4+  Knee extension 4+ 4+  Ankle dorsiflexion 4 4  Ankle plantarflexion    Ankle inversion    Ankle eversion    (Blank rows = not tested)  BED MOBILITY:  Not tested Pt reports pain w/this due to rib pain   TRANSFERS: Sit to stand: SBA  Assistive device utilized: None     Stand to sit: SBA  Assistive device utilized: None       GAIT Gait pattern: step through pattern, decreased hip/knee flexion- Right, decreased ankle dorsiflexion- Right, lateral hip instability, wide BOS, and poor foot clearance- Right Distance walked: Various clinic distances + >500' outside  Assistive device utilized: Foot-up brace Level of assistance: Modified independence and SBA Comments: Pt received from front parking lot and ambulated around building to back door to enter gym. Pt w/a few stumbles on incline outside (catching R foot), requiring CGA.    PATIENT SURVEYS:  ABC scale 47.5%   VITALS  Vitals:   05/31/23 1026  BP: 128/89  Pulse: 64  TREATMENT:    Self-care Assessed vitals (see above) and WNL.  Physical Performance   [facility] PT Assessment - 05/31/23 1028       Functional Gait  Assessment   Gait assessed  Yes    Gait Level Surface Walks 20 ft in less than 7 sec but greater than 5.5 sec, uses assistive device, slower speed, mild gait deviations, or deviates 6-10 in outside of the 12 in walkway width.   6.85s   Change in Gait Speed Able to change speed, demonstrates mild gait deviations, deviates 6-10 in outside of the 12 in walkway width, or no gait deviations, unable to achieve a major change in velocity, or uses a change in velocity, or uses an assistive device.    Gait with Horizontal Head Turns Performs head turns with moderate changes in gait velocity, slows down, deviates 10-15  in outside 12 in walkway width but recovers, can continue to walk.   instability w/turn to L   Gait with Vertical Head Turns Performs task with slight change in gait velocity (eg, minor disruption to smooth gait path), deviates 6 - 10 in outside 12 in walkway width or uses assistive device   Instability w/cervical flexion and extension   Gait and Pivot Turn Pivot turns safely within 3 sec and stops quickly with no loss of balance.    Step Over Obstacle Is able to step over one shoe box (4.5 in total height) without changing gait speed. No evidence of imbalance.    Gait with Narrow Base of Support Ambulates 4-7 steps.    Gait with Eyes Closed Cannot walk 20 ft without assistance, severe gait deviations or imbalance, deviates greater than 15 in outside 12 in walkway width or will not attempt task.   16.53s, grabbed onto therapsit   Ambulating Backwards Walks 20 ft, slow speed, abnormal gait pattern, evidence for imbalance, deviates 10-15 in outside 12 in walkway width.   16.25   Steps Two feet to a stair, must use rail.   Step-through to ascend, step-to to descend   Total Score 15    FGA comment: High fall risk            Self-care (cont.) Lengthy discussion regarding results of FGA, recent abnormal behavior and need to use an AD at all times. Pt reports she has always struggled w/ADD and her attention seems to be getting worse. Majority of pt's falls, if not all of them, have been due to pt being distracted and moving impulsively. Pt requesting therapist message PCP on her behalf and discuss concerns that therapist has. Pt reports she will start using rollator at all times, especially out in community    Gait pattern: step through pattern, decreased hip/knee flexion- Right, decreased hip/knee flexion- Left, decreased ankle dorsiflexion- Right, decreased ankle dorsiflexion- Left, lateral hip instability, and wide BOS Distance walked: 115'  Assistive device utilized: Single point cane Level of  assistance: SBA Comments: Assessed pt's use of SPC, as she tends to carry this around with her. Noted pt flinging cane way too far ahead of her and not using properly as well as increased lateral instability. Continue to recommend pt use rollator at all times, pt verbalized understanding,    PATIENT EDUCATION: Education details: See self-care section above  Person educated: Patient Education method: Explanation and Demonstration Education comprehension: verbalized understanding and needs further education  HOME EXERCISE PROGRAM: From previous POC:  Access Code: [email or web address]   GOALS: Goals reviewed with patient? Yes  SHORT TERM GOALS:  Target date: 05/16/2023   Pt will improve 5 x STS to less than or equal to 16 seconds w/hip-width BOS to demonstrate improved functional strength and transfer efficiency.   Baseline: 19.15s w/wide BOS and no UE support; 9.9s no UE support  Goal status: MET  2.  FGA to be assessed and LTG updated  Baseline:  Goal status: MET  3.  Pt will improve gait velocity to at least 3.1 ft/s for improved gait efficiency and independence   Baseline: 3.24 ft/s (5/15)  Goal status: MET   LONG TERM GOALS: Target date: 05/30/2023   Pt will improve FGA to 25/30 for decreased fall risk   Baseline: 19/30; 15/30 (5/27) Goal status: NOT MET   2.  Pt will score >/= 60% on ABC scale for improved self-confidence and independence  Baseline: 47.5%  Goal status: INITIAL  3.  Pt will improve 5 x STS to less than or equal to 12 seconds w/proper body mechanics to demonstrate improved functional strength and transfer efficiency.   Baseline: 19.15s w/wide BOS; 9.9s w/no UE support (5/13)  Goal status: MET  4.  Pt will improve gait velocity to at least 3.4 ft/s for improved gait efficiency and independence   Baseline: 3.24 ft/s (5/15) Goal status: REVISED   5.  Pt will perform floor transfer w/SBA for improved safety at home and fall recovery  Baseline:  Goal status:  MET   ASSESSMENT:  CLINICAL IMPRESSION: Emphasis of skilled PT session on LTG assessment and pt education regarding her safety. Pt scored a 15/30 on FGA, lower than her baseline score of 19/30. Pt has demonstrated some concerning behavior recently that is not her baseline, with increased impulsiveness and inattention, both of which have caused her to fall. Recommend pt use rollator at all times, which pt has previously refused but is now agreeable to due to recent fall at [employer]. Will plan to message PCP about cognitive change as pt's imbalance no longer seems to stem from a physical impairment. Continue POC.    OBJECTIVE IMPAIRMENTS: Abnormal gait, decreased activity tolerance, decreased balance, decreased coordination, decreased endurance, difficulty walking, decreased strength, dizziness, impaired sensation, improper body mechanics, and pain   ACTIVITY LIMITATIONS: carrying, lifting, squatting, sleeping, stairs, transfers, bed mobility, reach over head, and locomotion level  PARTICIPATION LIMITATIONS: cleaning, laundry, driving, shopping, community activity, and yard work  PERSONAL FACTORS: Past/current experiences and 1-2 comorbidities: Recent ACDF and chronic R sided sciatica are also affecting patient's functional outcome.   REHAB POTENTIAL: Good  CLINICAL DECISION MAKING: Evolving/moderate complexity  EVALUATION COMPLEXITY: Moderate  PLAN:  PT FREQUENCY: 2x/week  PT DURATION: 6 weeks  PLANNED INTERVENTIONS: 97164- PT Re-evaluation, 97750- Physical Performance Testing, 97110-Therapeutic exercises, 97530- Therapeutic activity, 97112- Neuromuscular re-education, 97535- Self Care, 16109- Manual therapy, [phone]- Gait training, [phone]- Electrical stimulation (manual), Patient/Family education, Balance training, Stair training, Dry Needling, Joint mobilization, Spinal mobilization, Scar mobilization, Vestibular training, and DME instructions  PLAN FOR NEXT SESSION: Review  previous HEP and update prn. Monitor vitals. Trial Bioness on RLE. Ankle stability, dynadiscs, river rocks, cone drill. Goals and recert?    Shaeley Segall E Anguel Delapena, PT, DPT 05/31/2023, 12:00 PM

## 2023-06-02 ENCOUNTER — Ambulatory Visit: Admitting: Physical Therapy

## 2023-06-02 VITALS — BP 151/85 | HR 59

## 2023-06-02 DIAGNOSIS — R2689 Other abnormalities of gait and mobility: Secondary | ICD-10-CM | POA: Diagnosis not present

## 2023-06-02 DIAGNOSIS — M542 Cervicalgia: Secondary | ICD-10-CM | POA: Diagnosis not present

## 2023-06-02 DIAGNOSIS — M6281 Muscle weakness (generalized): Secondary | ICD-10-CM

## 2023-06-02 DIAGNOSIS — R296 Repeated falls: Secondary | ICD-10-CM | POA: Diagnosis not present

## 2023-06-02 DIAGNOSIS — M5431 Sciatica, right side: Secondary | ICD-10-CM

## 2023-06-02 DIAGNOSIS — R2681 Unsteadiness on feet: Secondary | ICD-10-CM | POA: Diagnosis not present

## 2023-06-02 NOTE — Therapy (Signed)
 OUTPATIENT PHYSICAL THERAPY NEURO TREATMENT- RECERTIFICATION    Patient Name: Marquetta Weiskopf MRN: [id number] DOB:09/26/1950, 73 y.o., female Today's Date: 06/02/2023   PCP: Alvie Ax, MD REFERRING PROVIDER: Ginnie Laine, MD    END OF SESSION:  PT End of Session - 06/02/23 1107     Visit Number 13    Number of Visits 25   Recert   Date for PT Re-Evaluation 07/14/23   Recert   Authorization Type [employer]    PT Start Time 1104    PT Stop Time 1142    PT Time Calculation (min) 38 min    Equipment Utilized During Treatment Gait belt   Foot-up brace   Activity Tolerance Patient tolerated treatment well    Behavior During Therapy Impulsive;WFL for tasks assessed/performed                   Past Medical History:  Diagnosis Date   Abdominal pain 06/06/2018   Acute renal failure (ARF) (HCC) 06/22/2018   Anemia in chronic kidney disease 08/25/2018   Anemia, unspecified 07/05/2018   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 states she has 2 aneurysms   Chronic low back pain 04/12/2017   Chronic pain syndrome 04/12/2017   Closed fracture of left distal radius    De Quervain's tenosynovitis 02/10/2018   Degeneration of lumbar intervertebral disc 03/01/2017   End stage renal failure on dialysis [facility])    M/W/F on [employer]   GERD (gastroesophageal reflux disease)    Goodpasture syndrome (HCC)    Headache 10/30/2014   Hyperlipidemia 04/17/2018   Hypertension    Hypothyroidism    Iron deficiency anemia, unspecified 07/12/2018   PONV (postoperative nausea and vomiting)    violent vomiting   Sacral back pain 05/25/2016   Scoliosis of lumbar spine 03/01/2017   Secondary hyperparathyroidism of renal origin (HCC) 08/22/2018   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   Thyroid  disease    Trigger finger of left thumb 02/10/2018   Past  Surgical History:  Procedure Laterality Date   ARTERY BIOPSY Right 10/31/2014   Procedure: BIOPSY TEMPORAL ARTERY-RIGHT;  Surgeon: Palma Bob, MD;  Location: [facility] OR;  Service: Vascular;  Laterality: Right;   AV FISTULA PLACEMENT Left 10/11/2018   Procedure: left arm ARTERIOVENOUS (AV) FISTULA  creation;  Surgeon: Adine Hoof, MD;  Location: [facility] OR;  Service: Vascular;  Laterality: Left;   BILATERAL CARPAL TUNNEL RELEASE     BIOPSY  04/29/2019   Procedure: BIOPSY;  Surgeon: Felecia Hopper, MD;  Location: [facility];  Service: Gastroenterology;;   BIOPSY  11/05/2019   Procedure: BIOPSY;  Surgeon: Felecia Hopper, MD;  Location: [facility];  Service: Gastroenterology;;   BREAST SURGERY Left    lumpectomy   BUNIONECTOMY Right    CHOLECYSTECTOMY N/A 10/13/2016   Procedure: LAPAROSCOPIC CHOLECYSTECTOMY WITH INTRAOPERATIVE CHOLANGIOGRAM;  Surgeon: Dareen Ebbing, MD;  Location: [facility] OR;  Service: General;  Laterality: N/A;   COLONOSCOPY WITH PROPOFOL  N/A 11/05/2019   Procedure: COLONOSCOPY WITH PROPOFOL ;  Surgeon: Felecia Hopper, MD;  Location: [facility];  Service: Gastroenterology;  Laterality: N/A;   ESOPHAGOGASTRODUODENOSCOPY (EGD) WITH PROPOFOL  N/A 04/29/2019   Procedure: ESOPHAGOGASTRODUODENOSCOPY (EGD) WITH PROPOFOL ;  Surgeon: Felecia Hopper, MD;  Location: [facility];  Service: Gastroenterology;  Laterality: N/A;   EYE SURGERY     surgery for cross eye  FOOT FRACTURE SURGERY     OPEN REDUCTION INTERNAL FIXATION (ORIF) DISTAL RADIAL FRACTURE Left 03/29/2017   Procedure: OPEN REDUCTION INTERNAL FIXATION (ORIF)LEFT  DISTAL RADIAL FRACTURE;  Surgeon: Brunilda Capra, MD;  Location: [facility];  Service: Orthopedics;  Laterality: Left;   POLYPECTOMY  11/05/2019   Procedure: POLYPECTOMY;  Surgeon: Felecia Hopper, MD;  Location: [facility];  Service: Gastroenterology;;   THYROIDECTOMY     TONSILLECTOMY     WISDOM TOOTH EXTRACTION     Patient Active  Problem List   Diagnosis Date Noted   Hypertensive crisis 10/23/2019   History of cerebral aneurysm 10/23/2019   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   Hypoxemia 02/21/2019   Pulmonary edema 02/20/2019   Encounter for immunization 09/26/2018   Anemia in chronic kidney disease 08/25/2018   End stage renal disease (HCC) 08/25/2018   Secondary hyperparathyroidism of renal origin (HCC) 08/22/2018   Referred otalgia of left ear 08/16/2018   Iron deficiency anemia, unspecified 07/12/2018   Shortness of breath 07/06/2018   Anemia, unspecified 07/05/2018   Anxiety 07/05/2018   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   Acute renal failure (ARF) (HCC) 06/22/2018   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   Abdominal pain 06/06/2018   Hydronephrosis due to obstruction of ureteral orifice 06/06/2018   Disease of thyroid  gland 04/17/2018   Hyperlipidemia 04/17/2018   Hypertension 04/17/2018   De Quervain's tenosynovitis 02/10/2018   Trigger finger of left thumb 02/10/2018   Chronic low back pain 04/12/2017   Chronic pain syndrome 04/12/2017   Degeneration of lumbar intervertebral disc 03/01/2017   Scoliosis of lumbar spine 03/01/2017   Sacral back pain 05/25/2016   Eye pain 10/30/2014    ONSET DATE: 04/11/2023 (referral)  REFERRING DIAG: R29.6 (ICD-10-CM) - Repeated falls  THERAPY DIAG:  Unsteadiness on feet  Muscle weakness (generalized)  Repeated falls  Sciatica, right side  Rationale for Evaluation and Treatment: Rehabilitation  SUBJECTIVE:                                                                                                                                                                                             SUBJECTIVE STATEMENT: Pt presents without AD. Denies falls. States she walked w/a friend yesterday, did not use the rollator.   Pt accompanied by: self  PERTINENT HISTORY: hypothyroidism; HTN; HLD; Goodpasture  syndrome; cerebral aneurysm; ESRD on TTS HD; chronic pain; ACDF on 03/08/23  PAIN:  Are you having pain? Yes: NPRS scale: 4-5/10 Pain location: RLE Pain description: Achy/throbbing   Aggravating factors: Sitting for too long  Relieving factors: Tramadol   PRECAUTIONS: Cervical, Fall, and  Other: 2 broken ribs on R side  (follows up w Dr. Ellery Guthrie on 5/2)   RED FLAGS: Cervical red flags: Dysphagia No, Dysmetria No, Diplopia No, Nystagmus No, and Nausea No   WEIGHT BEARING RESTRICTIONS: No  FALLS: Has patient fallen in last 6 months? Yes. Number of falls 3  LIVING ENVIRONMENT: Lives with: lives alone Lives in: House/apartment Stairs: Yes: External: 2 in back, 4-5 in front steps; No rails in back, banister in front Has following equipment at home: Single point cane and Walker - 4 wheeled  PLOF: Independent  PATIENT GOALS: "I don't wanna walk like I feel like I am gonna fall and wobble wobble"   OBJECTIVE:  Note: Objective measures were completed at Evaluation unless otherwise noted.  DIAGNOSTIC FINDINGS: CT of chest from 04/02/23  IMPRESSION: Healing posterior right ninth and tenth rib fractures which are nondisplaced. No hemothorax or pneumothorax.  COGNITION: Overall cognitive status: Within functional limits for tasks assessed   SENSATION: Impaired sensation in RUE and sciatica in RLE   POSTURE: rounded shoulders, forward head, and increased thoracic kyphosis  LOWER EXTREMITY ROM:     Active  Right Eval Left Eval  Hip flexion    Hip extension    Hip abduction    Hip adduction    Hip internal rotation    Hip external rotation    Knee flexion    Knee extension    Ankle dorsiflexion    Ankle plantarflexion    Ankle inversion    Ankle eversion     (Blank rows = not tested)  LOWER EXTREMITY MMT:  Tested in seated position   MMT Right Eval Left Eval  Hip flexion 4- 4  Hip extension    Hip abduction 4+ 4+  Hip adduction 4+ 4+  Hip internal rotation    Hip  external rotation    Knee flexion 4+ 4+  Knee extension 4+ 4+  Ankle dorsiflexion 4 4  Ankle plantarflexion    Ankle inversion    Ankle eversion    (Blank rows = not tested)  BED MOBILITY:  Not tested Pt reports pain w/this due to rib pain   TRANSFERS: Sit to stand: SBA  Assistive device utilized: None     Stand to sit: SBA  Assistive device utilized: None       GAIT Gait pattern: step through pattern, decreased hip/knee flexion- Right, decreased ankle dorsiflexion- Right, lateral hip instability, wide BOS, and poor foot clearance- Right Distance walked: Various clinic distances + >500' outside  Assistive device utilized: Foot-up brace Level of assistance: Modified independence and SBA Comments: Pt received from front parking lot and ambulated around building to back door to enter gym. Pt w/a few stumbles on incline outside (catching R foot), requiring CGA.    PATIENT SURVEYS:  ABC scale 47.5%   VITALS  Vitals:   06/02/23 1110  BP: (!) 151/85  Pulse: (!) 59  TREATMENT:    Self-care Assessed vitals (see above) and WNL. Continue to encourage pt to use rollator at all times due to safety concerns and high fall risk.   Ther Act - LTG Assessment   [facility] PT Assessment - 06/02/23 1119       Observation/Other Assessments   Other Surveys  Activities of Balance and Confidence Scale    Activities of Balance Confidence Scale (ABC Scale)  85%   High level of physical functioning     Ambulation/Gait   Gait velocity 32.8' over 7.88s = 4.16s no AD            5 Blaze pods on random reach setting for improved single leg stability, LE coordination and ankle strategy. Performed on 2 minute intervals with 1-2 minute rest periods.  Pt requires SBA guarding and UE support on rails. Round 1:  Standing on airex in front of steps w/3 pods placed on second step and 2 pods on first  step.  83 hits w/BUE support. Round 2:  same setup.  66 hits w/single UE support. Notable errors/deficits: Due to pt moving too quickly, pt had difficulty maintaining proper feet position on foam, resulting in anterior instability. Pt aware that she was not fully placing feet on foam. RPE of 8-9/10 w/activity.  Discussed POC moving forward and pt in agreement to reduce frequency to 1x/week and recert for 4 more weeks rather than the planned 8. Pt to work on returning to gym and using rollator at all times to prepare for DC from PT.    PATIENT EDUCATION: Education details: Goal results, PT POC, using rollator at all times  Person educated: Patient Education method: Medical illustrator Education comprehension: verbalized understanding and needs further education  HOME EXERCISE PROGRAM: From previous POC:  Access Code: [email or web address]   GOALS: Goals reviewed with patient? Yes  SHORT TERM GOALS: Target date: 05/16/2023   Pt will improve 5 x STS to less than or equal to 16 seconds w/hip-width BOS to demonstrate improved functional strength and transfer efficiency.   Baseline: 19.15s w/wide BOS and no UE support; 9.9s no UE support  Goal status: MET  2.  FGA to be assessed and LTG updated  Baseline:  Goal status: MET  3.  Pt will improve gait velocity to at least 3.1 ft/s for improved gait efficiency and independence   Baseline: 3.24 ft/s (5/15)  Goal status: MET   LONG TERM GOALS: Target date: 05/30/2023   Pt will improve FGA to 25/30 for decreased fall risk   Baseline: 19/30; 15/30 (5/27) Goal status: NOT MET   2.  Pt will score >/= 60% on ABC scale for improved self-confidence and independence  Baseline: 47.5%; 85% (5/29) Goal status: MET  3.  Pt will improve 5 x STS to less than or equal to 12 seconds w/proper body mechanics to demonstrate improved functional strength and transfer efficiency.   Baseline: 19.15s w/wide BOS; 9.9s w/no UE support (5/13)  Goal status:  MET  4.  Pt will improve gait velocity to at least 3.4 ft/s for improved gait efficiency and independence   Baseline: 3.24 ft/s (5/15); 4.16 ft/s (5/29) Goal status: MET   5.  Pt will perform floor transfer w/SBA for improved safety at home and fall recovery  Baseline: not assessed on 5/29 due to recent knee injury  Goal status: IN PROGRESS    NEW LONG TERM GOALS:  Target date: 06/30/2023  Pt will perform floor transfer w/SBA for improved safety at home  and fall recovery  Baseline:  Goal status: IN PROGRESS  2.  Pt will improve FGA to 20/30 for decreased fall risk   Baseline: 15/30 (5/27) Goal status: INITIAL    ASSESSMENT:  CLINICAL IMPRESSION: Emphasis of skilled PT session on completing LTG assessment, pt education and anticipatory balance strategies. Pt has met 3 of 5 LTGs, improving her gait speed without AD, her time and body mechanics on 5x STS and her ABC score to 85%. Did not assess floor transfer as pt had fall last week and has residual knee injury. Pt did not improve her score on FGA, but rather scored lower than on evaluation (see previous treatment note). Despite improvements in gait speed and functional strength, pt continues to have frequent falls. This is largely due to pt's poor selective attention and impulsiveness. Strongly recommend pt use rollator at all times in community due to this, but pt noncompliant as of today. Pt in agreement to recert at a frequency of 1x/week for 4 more weeks to work on returning to the gym and compliance w/rollator for reduced fall risk and maintained strength. Continue POC.    OBJECTIVE IMPAIRMENTS: Abnormal gait, decreased activity tolerance, decreased balance, decreased coordination, decreased endurance, difficulty walking, decreased strength, dizziness, impaired sensation, improper body mechanics, and pain   ACTIVITY LIMITATIONS: carrying, lifting, squatting, sleeping, stairs, transfers, bed mobility, reach over head, and  locomotion level  PARTICIPATION LIMITATIONS: cleaning, laundry, driving, shopping, community activity, and yard work  PERSONAL FACTORS: Past/current experiences and 1-2 comorbidities: Recent ACDF and chronic R sided sciatica are also affecting patient's functional outcome.   REHAB POTENTIAL: Good  CLINICAL DECISION MAKING: Evolving/moderate complexity  EVALUATION COMPLEXITY: Moderate  PLAN:  PT FREQUENCY: 1-2x/week (recert)  PT DURATION: 6 weeks + 4 weeks (recert)   PLANNED INTERVENTIONS: 16109- PT Re-evaluation, 97750- Physical Performance Testing, 97110-Therapeutic exercises, 97530- Therapeutic activity, [id number]- Neuromuscular re-education, 97535- Self Care, [id number]- Manual therapy, [phone]- Gait training, [phone]- Electrical stimulation (manual), Patient/Family education, Balance training, Stair training, Dry Needling, Joint mobilization, Spinal mobilization, Scar mobilization, Vestibular training, and DME instructions  PLAN FOR NEXT SESSION: Review previous HEP and update prn. Monitor vitals. Trial Bioness on RLE. Ankle stability, dynadiscs, river rocks, cone drill.    Leeland Lovelady E Miabella Shannahan, PT, DPT 06/02/2023, 11:52 AM

## 2023-06-03 ENCOUNTER — Other Ambulatory Visit: Payer: Self-pay | Admitting: Physical Medicine and Rehabilitation

## 2023-06-03 DIAGNOSIS — M5416 Radiculopathy, lumbar region: Secondary | ICD-10-CM

## 2023-06-07 ENCOUNTER — Ambulatory Visit: Admitting: Physical Therapy

## 2023-06-08 DIAGNOSIS — F411 Generalized anxiety disorder: Secondary | ICD-10-CM | POA: Diagnosis not present

## 2023-06-09 ENCOUNTER — Ambulatory Visit: Attending: Vascular Surgery | Admitting: Physical Therapy

## 2023-06-09 VITALS — BP 124/72 | HR 61

## 2023-06-09 DIAGNOSIS — M5431 Sciatica, right side: Secondary | ICD-10-CM | POA: Insufficient documentation

## 2023-06-09 DIAGNOSIS — M6281 Muscle weakness (generalized): Secondary | ICD-10-CM | POA: Diagnosis not present

## 2023-06-09 DIAGNOSIS — R296 Repeated falls: Secondary | ICD-10-CM | POA: Insufficient documentation

## 2023-06-09 DIAGNOSIS — R2681 Unsteadiness on feet: Secondary | ICD-10-CM | POA: Diagnosis not present

## 2023-06-09 NOTE — Therapy (Signed)
 OUTPATIENT PHYSICAL THERAPY NEURO TREATMENT   Patient Name: Cristina Wilson MRN: [id number] DOB:January 16, 1950, 73 y.o., female Today's Date: 06/09/2023   PCP: Alvie Ax, MD REFERRING PROVIDER: Ginnie Laine, MD    END OF SESSION:  PT End of Session - 06/09/23 1234     Visit Number 14    Number of Visits 25   Recert   Date for PT Re-Evaluation 07/14/23   Recert   Authorization Type [employer]    PT Start Time 1233    PT Stop Time 1314    PT Time Calculation (min) 41 min    Equipment Utilized During Treatment Gait belt   Foot-up brace   Activity Tolerance Patient tolerated treatment well    Behavior During Therapy Impulsive;WFL for tasks assessed/performed                    Past Medical History:  Diagnosis Date   Abdominal pain 06/06/2018   Acute renal failure (ARF) (HCC) 06/22/2018   Anemia in chronic kidney disease 08/25/2018   Anemia, unspecified 07/05/2018   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 states she has 2 aneurysms   Chronic low back pain 04/12/2017   Chronic pain syndrome 04/12/2017   Closed fracture of left distal radius    De Quervain's tenosynovitis 02/10/2018   Degeneration of lumbar intervertebral disc 03/01/2017   End stage renal failure on dialysis [facility])    M/W/F on [employer]   GERD (gastroesophageal reflux disease)    Goodpasture syndrome (HCC)    Headache 10/30/2014   Hyperlipidemia 04/17/2018   Hypertension    Hypothyroidism    Iron deficiency anemia, unspecified 07/12/2018   PONV (postoperative nausea and vomiting)    violent vomiting   Sacral back pain 05/25/2016   Scoliosis of lumbar spine 03/01/2017   Secondary hyperparathyroidism of renal origin (HCC) 08/22/2018   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   Thyroid  disease    Trigger finger of left thumb 02/10/2018   Past Surgical History:   Procedure Laterality Date   ARTERY BIOPSY Right 10/31/2014   Procedure: BIOPSY TEMPORAL ARTERY-RIGHT;  Surgeon: Palma Bob, MD;  Location: [facility] OR;  Service: Vascular;  Laterality: Right;   AV FISTULA PLACEMENT Left 10/11/2018   Procedure: left arm ARTERIOVENOUS (AV) FISTULA  creation;  Surgeon: Adine Hoof, MD;  Location: [facility] OR;  Service: Vascular;  Laterality: Left;   BILATERAL CARPAL TUNNEL RELEASE     BIOPSY  04/29/2019   Procedure: BIOPSY;  Surgeon: Felecia Hopper, MD;  Location: [facility];  Service: Gastroenterology;;   BIOPSY  11/05/2019   Procedure: BIOPSY;  Surgeon: Felecia Hopper, MD;  Location: [facility];  Service: Gastroenterology;;   BREAST SURGERY Left    lumpectomy   BUNIONECTOMY Right    CHOLECYSTECTOMY N/A 10/13/2016   Procedure: LAPAROSCOPIC CHOLECYSTECTOMY WITH INTRAOPERATIVE CHOLANGIOGRAM;  Surgeon: Dareen Ebbing, MD;  Location: [facility] OR;  Service: General;  Laterality: N/A;   COLONOSCOPY WITH PROPOFOL  N/A 11/05/2019   Procedure: COLONOSCOPY WITH PROPOFOL ;  Surgeon: Felecia Hopper, MD;  Location: [facility];  Service: Gastroenterology;  Laterality: N/A;   ESOPHAGOGASTRODUODENOSCOPY (EGD) WITH PROPOFOL  N/A 04/29/2019   Procedure: ESOPHAGOGASTRODUODENOSCOPY (EGD) WITH PROPOFOL ;  Surgeon: Felecia Hopper, MD;  Location: [facility];  Service: Gastroenterology;  Laterality: N/A;   EYE SURGERY     surgery for cross eye   FOOT  FRACTURE SURGERY     OPEN REDUCTION INTERNAL FIXATION (ORIF) DISTAL RADIAL FRACTURE Left 03/29/2017   Procedure: OPEN REDUCTION INTERNAL FIXATION (ORIF)LEFT  DISTAL RADIAL FRACTURE;  Surgeon: Brunilda Capra, MD;  Location: [facility];  Service: Orthopedics;  Laterality: Left;   POLYPECTOMY  11/05/2019   Procedure: POLYPECTOMY;  Surgeon: Felecia Hopper, MD;  Location: [facility];  Service: Gastroenterology;;   THYROIDECTOMY     TONSILLECTOMY     WISDOM TOOTH EXTRACTION     Patient Active Problem List    Diagnosis Date Noted   Hypertensive crisis 10/23/2019   History of cerebral aneurysm 10/23/2019   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   Hypoxemia 02/21/2019   Pulmonary edema 02/20/2019   Encounter for immunization 09/26/2018   Anemia in chronic kidney disease 08/25/2018   End stage renal disease (HCC) 08/25/2018   Secondary hyperparathyroidism of renal origin (HCC) 08/22/2018   Referred otalgia of left ear 08/16/2018   Iron deficiency anemia, unspecified 07/12/2018   Shortness of breath 07/06/2018   Anemia, unspecified 07/05/2018   Anxiety 07/05/2018   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   Acute renal failure (ARF) (HCC) 06/22/2018   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   Abdominal pain 06/06/2018   Hydronephrosis due to obstruction of ureteral orifice 06/06/2018   Disease of thyroid  gland 04/17/2018   Hyperlipidemia 04/17/2018   Hypertension 04/17/2018   De Quervain's tenosynovitis 02/10/2018   Trigger finger of left thumb 02/10/2018   Chronic low back pain 04/12/2017   Chronic pain syndrome 04/12/2017   Degeneration of lumbar intervertebral disc 03/01/2017   Scoliosis of lumbar spine 03/01/2017   Sacral back pain 05/25/2016   Eye pain 10/30/2014    ONSET DATE: 04/11/2023 (referral)  REFERRING DIAG: R29.6 (ICD-10-CM) - Repeated falls  THERAPY DIAG:  Unsteadiness on feet  Muscle weakness (generalized)  Repeated falls  Rationale for Evaluation and Treatment: Rehabilitation  SUBJECTIVE:                                                                                                                                                                                             SUBJECTIVE STATEMENT: Pt presents without AD. Denies falls. States she is still not using the rollator. Has a MRI of low back on the 18th due to ongoing pain.   Pt accompanied by: self  PERTINENT HISTORY: hypothyroidism; HTN; HLD; Goodpasture syndrome;  cerebral aneurysm; ESRD on TTS HD; chronic pain; ACDF on 03/08/23  PAIN:  Are you having pain? Yes: NPRS scale: 6-7/10 Pain location: RLE and low back Pain description: Achy/throbbing   Aggravating factors: Sitting for too long  Relieving factors: Tramadol   PRECAUTIONS: Cervical, Fall, and Other: 2 broken ribs on R side    RED FLAGS: Cervical red flags: Dysphagia No, Dysmetria No, Diplopia No, Nystagmus No, and Nausea No   WEIGHT BEARING RESTRICTIONS: No  FALLS: Has patient fallen in last 6 months? Yes. Number of falls 3  LIVING ENVIRONMENT: Lives with: lives alone Lives in: House/apartment Stairs: Yes: External: 2 in back, 4-5 in front steps; No rails in back, banister in front Has following equipment at home: Single point cane and Walker - 4 wheeled  PLOF: Independent  PATIENT GOALS: "I don't wanna walk like I feel like I am gonna fall and wobble wobble"   OBJECTIVE:  Note: Objective measures were completed at Evaluation unless otherwise noted.  DIAGNOSTIC FINDINGS: CT of chest from 04/02/23  IMPRESSION: Healing posterior right ninth and tenth rib fractures which are nondisplaced. No hemothorax or pneumothorax.  COGNITION: Overall cognitive status: Within functional limits for tasks assessed   SENSATION: Impaired sensation in RUE and sciatica in RLE   POSTURE: rounded shoulders, forward head, and increased thoracic kyphosis  LOWER EXTREMITY ROM:     Active  Right Eval Left Eval  Hip flexion    Hip extension    Hip abduction    Hip adduction    Hip internal rotation    Hip external rotation    Knee flexion    Knee extension    Ankle dorsiflexion    Ankle plantarflexion    Ankle inversion    Ankle eversion     (Blank rows = not tested)  LOWER EXTREMITY MMT:  Tested in seated position   MMT Right Eval Left Eval  Hip flexion 4- 4  Hip extension    Hip abduction 4+ 4+  Hip adduction 4+ 4+  Hip internal rotation    Hip external rotation    Knee  flexion 4+ 4+  Knee extension 4+ 4+  Ankle dorsiflexion 4 4  Ankle plantarflexion    Ankle inversion    Ankle eversion    (Blank rows = not tested)  BED MOBILITY:  Not tested Pt reports pain w/this due to rib pain   TRANSFERS: Sit to stand: SBA  Assistive device utilized: None     Stand to sit: SBA  Assistive device utilized: None       GAIT Gait pattern: step through pattern, decreased hip/knee flexion- Right, decreased ankle dorsiflexion- Right, lateral hip instability, wide BOS, and poor foot clearance- Right Distance walked: Various clinic distances + >500' outside  Assistive device utilized: Foot-up brace Level of assistance: Modified independence and SBA Comments: Pt received from front parking lot and ambulated around building to back door to enter gym. Pt w/a few stumbles on incline outside (catching R foot), requiring CGA.    PATIENT SURVEYS:  ABC scale 47.5%   VITALS  Vitals:   06/09/23 1240  BP: 124/72  Pulse: 61  TREATMENT:    Self-care Assessed vitals (see above) and WNL. Continue to encourage pt to use rollator at all times due to safety concerns and high fall risk.   Ther Act  Pt performed floor transfer x2 mod I to descend and min A to push up to stand due to RLE weakness On red floor mat for improved proximal stability, imitation of gardening and functional BLE strength:   With Knees on bosu (blue side) in tall kneel. CGA-min A for stability:  Reaching for Squigz on red floor mat  Cone stacks w/mini cones  Sit to stands from 14" box w/12# KB in goblet position, 2x5 reps w/CGA for improved eccentric control and BLE strength. Pt only able to control decent if holding weight away from body and using as counterbalance.  On large blue theraball for improved proximal stability:  Alt marches w/5s hold, x14 reps per side w/CGA-min A to stabilize ball.  Increased difficulty lifting RLE > LLE   PATIENT EDUCATION: Education details: importance of using rollator at all times, continue HEP  Person educated: Patient Education method: Medical illustrator Education comprehension: verbalized understanding and needs further education  HOME EXERCISE PROGRAM: From previous POC:  Access Code: [email or web address]   GOALS: Goals reviewed with patient? Yes  SHORT TERM GOALS: Target date: 05/16/2023   Pt will improve 5 x STS to less than or equal to 16 seconds w/hip-width BOS to demonstrate improved functional strength and transfer efficiency.   Baseline: 19.15s w/wide BOS and no UE support; 9.9s no UE support  Goal status: MET  2.  FGA to be assessed and LTG updated  Baseline:  Goal status: MET  3.  Pt will improve gait velocity to at least 3.1 ft/s for improved gait efficiency and independence   Baseline: 3.24 ft/s (5/15)  Goal status: MET   LONG TERM GOALS: Target date: 05/30/2023   Pt will improve FGA to 25/30 for decreased fall risk   Baseline: 19/30; 15/30 (5/27) Goal status: NOT MET   2.  Pt will score >/= 60% on ABC scale for improved self-confidence and independence  Baseline: 47.5%; 85% (5/29) Goal status: MET  3.  Pt will improve 5 x STS to less than or equal to 12 seconds w/proper body mechanics to demonstrate improved functional strength and transfer efficiency.   Baseline: 19.15s w/wide BOS; 9.9s w/no UE support (5/13)  Goal status: MET  4.  Pt will improve gait velocity to at least 3.4 ft/s for improved gait efficiency and independence   Baseline: 3.24 ft/s (5/15); 4.16 ft/s (5/29) Goal status: MET   5.  Pt will perform floor transfer w/SBA for improved safety at home and fall recovery  Baseline: not assessed on 5/29 due to recent knee injury  Goal status: IN PROGRESS    NEW LONG TERM GOALS:  Target date: 06/30/2023  Pt will perform floor transfer w/SBA for improved safety at home and fall recovery   Baseline:  Goal status: IN PROGRESS  2.  Pt will improve FGA to 20/30 for decreased fall risk   Baseline: 15/30 (5/27) Goal status: INITIAL    ASSESSMENT:  CLINICAL IMPRESSION: Emphasis of skilled PT session on proximal stability, floor transfers and pt education. Pt continues to report noncompliance w/use of rollator despite stating she "knows" she is to use it. Continue to strongly encourage pt to use it due to her high fall risk and reduced endurance. Pt challenged by proximal stability tasks and required min A to perform half kneel >  stand from floor due to fatigue. Continue POC.    OBJECTIVE IMPAIRMENTS: Abnormal gait, decreased activity tolerance, decreased balance, decreased coordination, decreased endurance, difficulty walking, decreased strength, dizziness, impaired sensation, improper body mechanics, and pain   ACTIVITY LIMITATIONS: carrying, lifting, squatting, sleeping, stairs, transfers, bed mobility, reach over head, and locomotion level  PARTICIPATION LIMITATIONS: cleaning, laundry, driving, shopping, community activity, and yard work  PERSONAL FACTORS: Past/current experiences and 1-2 comorbidities: Recent ACDF and chronic R sided sciatica are also affecting patient's functional outcome.   REHAB POTENTIAL: Good  CLINICAL DECISION MAKING: Evolving/moderate complexity  EVALUATION COMPLEXITY: Moderate  PLAN:  PT FREQUENCY: 1-2x/week (recert)  PT DURATION: 6 weeks + 4 weeks (recert)   PLANNED INTERVENTIONS: 16109- PT Re-evaluation, 97750- Physical Performance Testing, 97110-Therapeutic exercises, 97530- Therapeutic activity, [id number]- Neuromuscular re-education, 97535- Self Care, [id number]- Manual therapy, [phone]- Gait training, [phone]- Electrical stimulation (manual), Patient/Family education, Balance training, Stair training, Dry Needling, Joint mobilization, Spinal mobilization, Scar mobilization, Vestibular training, and DME instructions  PLAN FOR NEXT SESSION: Review  previous HEP and update prn. Monitor vitals. Trial Bioness on RLE. Ankle stability, dynadiscs, river rocks, cone drill.    Erum Cercone E Dorthie Santini, PT, DPT 06/09/2023, 1:14 PM

## 2023-06-14 ENCOUNTER — Ambulatory Visit: Admitting: Physical Therapy

## 2023-06-14 VITALS — BP 134/59 | HR 57

## 2023-06-14 DIAGNOSIS — M6281 Muscle weakness (generalized): Secondary | ICD-10-CM

## 2023-06-14 DIAGNOSIS — R296 Repeated falls: Secondary | ICD-10-CM | POA: Diagnosis not present

## 2023-06-14 DIAGNOSIS — R2681 Unsteadiness on feet: Secondary | ICD-10-CM | POA: Diagnosis not present

## 2023-06-14 DIAGNOSIS — M5431 Sciatica, right side: Secondary | ICD-10-CM

## 2023-06-14 NOTE — Therapy (Signed)
 OUTPATIENT PHYSICAL THERAPY NEURO TREATMENT   Patient Name: Cristina Wilson MRN: [id number] DOB:1950/08/21, 72 y.o., female Today's Date: 06/14/2023   PCP: Alvie Ax, MD REFERRING PROVIDER: Ginnie Laine, MD    END OF SESSION:  PT End of Session - 06/14/23 1238     Visit Number 15    Number of Visits 25   Recert   Date for PT Re-Evaluation 07/14/23   Recert   Authorization Type [employer]    PT Start Time 1237   pt arrived late   PT Stop Time 1249   Pt requested to leave   PT Time Calculation (min) 12 min    Equipment Utilized During Treatment --   Foot-up brace   Activity Tolerance Patient limited by pain    Behavior During Therapy [facility] for tasks assessed/performed                    Past Medical History:  Diagnosis Date   Abdominal pain 06/06/2018   Acute renal failure (ARF) (HCC) 06/22/2018   Anemia in chronic kidney disease 08/25/2018   Anemia, unspecified 07/05/2018   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 states she has 2 aneurysms   Chronic low back pain 04/12/2017   Chronic pain syndrome 04/12/2017   Closed fracture of left distal radius    De Quervain's tenosynovitis 02/10/2018   Degeneration of lumbar intervertebral disc 03/01/2017   End stage renal failure on dialysis [facility])    M/W/F on [employer]   GERD (gastroesophageal reflux disease)    Goodpasture syndrome (HCC)    Headache 10/30/2014   Hyperlipidemia 04/17/2018   Hypertension    Hypothyroidism    Iron deficiency anemia, unspecified 07/12/2018   PONV (postoperative nausea and vomiting)    violent vomiting   Sacral back pain 05/25/2016   Scoliosis of lumbar spine 03/01/2017   Secondary hyperparathyroidism of renal origin (HCC) 08/22/2018   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   Thyroid  disease    Trigger finger of left thumb 02/10/2018   Past  Surgical History:  Procedure Laterality Date   ARTERY BIOPSY Right 10/31/2014   Procedure: BIOPSY TEMPORAL ARTERY-RIGHT;  Surgeon: Palma Bob, MD;  Location: [facility] OR;  Service: Vascular;  Laterality: Right;   AV FISTULA PLACEMENT Left 10/11/2018   Procedure: left arm ARTERIOVENOUS (AV) FISTULA  creation;  Surgeon: Adine Hoof, MD;  Location: [facility] OR;  Service: Vascular;  Laterality: Left;   BILATERAL CARPAL TUNNEL RELEASE     BIOPSY  04/29/2019   Procedure: BIOPSY;  Surgeon: Felecia Hopper, MD;  Location: [facility] END[facility]COPY;  Service: Gastroenterology;;   BIOPSY  11/05/2019   Procedure: BIOPSY;  Surgeon: Felecia Hopper, MD;  Location: [facility];  Service: Gastroenterology;;   BREAST SURGERY Left    lumpectomy   BUNIONECTOMY Right    CHOLECYSTECTOMY N/A 10/13/2016   Procedure: LAPAROSCOPIC CHOLECYSTECTOMY WITH INTRAOPERATIVE CHOLANGIOGRAM;  Surgeon: Dareen Ebbing, MD;  Location: [facility] OR;  Service: General;  Laterality: N/A;   COLONOSCOPY WITH PROPOFOL  N/A 11/05/2019   Procedure: COLONOSCOPY WITH PROPOFOL ;  Surgeon: Felecia Hopper, MD;  Location: [facility];  Service: Gastroenterology;  Laterality: N/A;   ESOPHAGOGASTRODUODENOSCOPY (EGD) WITH PROPOFOL  N/A 04/29/2019   Procedure: ESOPHAGOGASTRODUODENOSCOPY (EGD) WITH PROPOFOL ;  Surgeon: Felecia Hopper, MD;  Location: [facility];  Service: Gastroenterology;  Laterality: N/A;   EYE SURGERY  surgery for cross eye   FOOT FRACTURE SURGERY     OPEN REDUCTION INTERNAL FIXATION (ORIF) DISTAL RADIAL FRACTURE Left 03/29/2017   Procedure: OPEN REDUCTION INTERNAL FIXATION (ORIF)LEFT  DISTAL RADIAL FRACTURE;  Surgeon: Brunilda Capra, MD;  Location: [facility];  Service: Orthopedics;  Laterality: Left;   POLYPECTOMY  11/05/2019   Procedure: POLYPECTOMY;  Surgeon: Felecia Hopper, MD;  Location: [facility];  Service: Gastroenterology;;   THYROIDECTOMY     TONSILLECTOMY     WISDOM TOOTH EXTRACTION     Patient Active  Problem List   Diagnosis Date Noted   Hypertensive crisis 10/23/2019   History of cerebral aneurysm 10/23/2019   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   Hypoxemia 02/21/2019   Pulmonary edema 02/20/2019   Encounter for immunization 09/26/2018   Anemia in chronic kidney disease 08/25/2018   End stage renal disease (HCC) 08/25/2018   Secondary hyperparathyroidism of renal origin (HCC) 08/22/2018   Referred otalgia of left ear 08/16/2018   Iron deficiency anemia, unspecified 07/12/2018   Shortness of breath 07/06/2018   Anemia, unspecified 07/05/2018   Anxiety 07/05/2018   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   Acute renal failure (ARF) (HCC) 06/22/2018   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   Abdominal pain 06/06/2018   Hydronephrosis due to obstruction of ureteral orifice 06/06/2018   Disease of thyroid  gland 04/17/2018   Hyperlipidemia 04/17/2018   Hypertension 04/17/2018   De Quervain's tenosynovitis 02/10/2018   Trigger finger of left thumb 02/10/2018   Chronic low back pain 04/12/2017   Chronic pain syndrome 04/12/2017   Degeneration of lumbar intervertebral disc 03/01/2017   Scoliosis of lumbar spine 03/01/2017   Sacral back pain 05/25/2016   Eye pain 10/30/2014    ONSET DATE: 04/11/2023 (referral)  REFERRING DIAG: R29.6 (ICD-10-CM) - Repeated falls  THERAPY DIAG:  Unsteadiness on feet  Muscle weakness (generalized)  Repeated falls  Sciatica, right side  Rationale for Evaluation and Treatment: Rehabilitation  SUBJECTIVE:                                                                                                                                                                                             SUBJECTIVE STATEMENT: Pt presents without AD. Denies falls. States her sciatica pain is "horrible" and she would like to take a break from PT until she receives results of lumbar MRI.   Pt accompanied by:  self  PERTINENT HISTORY: hypothyroidism; HTN; HLD; Goodpasture syndrome; cerebral aneurysm; ESRD on TTS HD; chronic pain; ACDF on 03/08/23  PAIN:  Are you having pain? Yes: NPRS scale: 7-8/10 Pain location: RLE and low  back Pain description: Achy/throbbing   Aggravating factors: Sitting for too long  Relieving factors: Tramadol   PRECAUTIONS: Cervical, Fall, and Other: 2 broken ribs on R side    RED FLAGS: Cervical red flags: Dysphagia No, Dysmetria No, Diplopia No, Nystagmus No, and Nausea No   WEIGHT BEARING RESTRICTIONS: No  FALLS: Has patient fallen in last 6 months? Yes. Number of falls 3  LIVING ENVIRONMENT: Lives with: lives alone Lives in: House/apartment Stairs: Yes: External: 2 in back, 4-5 in front steps; No rails in back, banister in front Has following equipment at home: Single point cane and Walker - 4 wheeled  PLOF: Independent  PATIENT GOALS: "I don't wanna walk like I feel like I am gonna fall and wobble wobble"   OBJECTIVE:  Note: Objective measures were completed at Evaluation unless otherwise noted.  DIAGNOSTIC FINDINGS: CT of chest from 04/02/23  IMPRESSION: Healing posterior right ninth and tenth rib fractures which are nondisplaced. No hemothorax or pneumothorax.  COGNITION: Overall cognitive status: Within functional limits for tasks assessed   SENSATION: Impaired sensation in RUE and sciatica in RLE   POSTURE: rounded shoulders, forward head, and increased thoracic kyphosis  LOWER EXTREMITY ROM:     Active  Right Eval Left Eval  Hip flexion    Hip extension    Hip abduction    Hip adduction    Hip internal rotation    Hip external rotation    Knee flexion    Knee extension    Ankle dorsiflexion    Ankle plantarflexion    Ankle inversion    Ankle eversion     (Blank rows = not tested)  LOWER EXTREMITY MMT:  Tested in seated position   MMT Right Eval Left Eval  Hip flexion 4- 4  Hip extension    Hip abduction 4+ 4+  Hip  adduction 4+ 4+  Hip internal rotation    Hip external rotation    Knee flexion 4+ 4+  Knee extension 4+ 4+  Ankle dorsiflexion 4 4  Ankle plantarflexion    Ankle inversion    Ankle eversion    (Blank rows = not tested)  BED MOBILITY:  Not tested Pt reports pain w/this due to rib pain   TRANSFERS: Sit to stand: SBA  Assistive device utilized: None     Stand to sit: SBA  Assistive device utilized: None       GAIT Gait pattern: step through pattern, decreased hip/knee flexion- Right, decreased ankle dorsiflexion- Right, lateral hip instability, wide BOS, and poor foot clearance- Right Distance walked: Various clinic distances + >500' outside  Assistive device utilized: Foot-up brace Level of assistance: Modified independence and SBA Comments: Pt received from front parking lot and ambulated around building to back door to enter gym. Pt w/a few stumbles on incline outside (catching R foot), requiring CGA.    PATIENT SURVEYS:  ABC scale 47.5%   VITALS  Vitals:   06/14/23 1240  BP: (!) 134/59  Pulse: (!) 57  TREATMENT:    Self-care Assessed vitals (see above) and WNL. Discussed plan to pause PT until she receives results of lumbar MRI as she has hit a plateau with therapy and needs a break. Pt reports she does not feel well today and declined doing PT today. Pt's wishes respected.    PATIENT EDUCATION: Education details: importance of using rollator at all times, continue HEP, PT POC Person educated: Patient Education method: Medical illustrator Education comprehension: verbalized understanding and needs further education  HOME EXERCISE PROGRAM: From previous POC:  Access Code: [email or web address]   GOALS: Goals reviewed with patient? Yes  SHORT TERM GOALS: Target date: 05/16/2023   Pt will improve 5 x STS to less than or equal to 16 seconds w/hip-width BOS  to demonstrate improved functional strength and transfer efficiency.   Baseline: 19.15s w/wide BOS and no UE support; 9.9s no UE support  Goal status: MET  2.  FGA to be assessed and LTG updated  Baseline:  Goal status: MET  3.  Pt will improve gait velocity to at least 3.1 ft/s for improved gait efficiency and independence   Baseline: 3.24 ft/s (5/15)  Goal status: MET   LONG TERM GOALS: Target date: 05/30/2023   Pt will improve FGA to 25/30 for decreased fall risk   Baseline: 19/30; 15/30 (5/27) Goal status: NOT MET   2.  Pt will score >/= 60% on ABC scale for improved self-confidence and independence  Baseline: 47.5%; 85% (5/29) Goal status: MET  3.  Pt will improve 5 x STS to less than or equal to 12 seconds w/proper body mechanics to demonstrate improved functional strength and transfer efficiency.   Baseline: 19.15s w/wide BOS; 9.9s w/no UE support (5/13)  Goal status: MET  4.  Pt will improve gait velocity to at least 3.4 ft/s for improved gait efficiency and independence   Baseline: 3.24 ft/s (5/15); 4.16 ft/s (5/29) Goal status: MET   5.  Pt will perform floor transfer w/SBA for improved safety at home and fall recovery  Baseline: not assessed on 5/29 due to recent knee injury  Goal status: IN PROGRESS    NEW LONG TERM GOALS:  Target date: 06/30/2023  Pt will perform floor transfer w/SBA for improved safety at home and fall recovery  Baseline:  Goal status: IN PROGRESS  2.  Pt will improve FGA to 20/30 for decreased fall risk   Baseline: 15/30 (5/27) Goal status: INITIAL    ASSESSMENT:  CLINICAL IMPRESSION: Session limited due to pt's pain levels and request to pause PT until she receives results of lumbar MRI. Pt discouraged w/lack of improvement in PT w/sciatica pain and would benefit from a short break. Pt not feeling well today and declined PT. Continue POC.    OBJECTIVE IMPAIRMENTS: Abnormal gait, decreased activity tolerance, decreased  balance, decreased coordination, decreased endurance, difficulty walking, decreased strength, dizziness, impaired sensation, improper body mechanics, and pain   ACTIVITY LIMITATIONS: carrying, lifting, squatting, sleeping, stairs, transfers, bed mobility, reach over head, and locomotion level  PARTICIPATION LIMITATIONS: cleaning, laundry, driving, shopping, community activity, and yard work  PERSONAL FACTORS: Past/current experiences and 1-2 comorbidities: Recent ACDF and chronic R sided sciatica are also affecting patient's functional outcome.   REHAB POTENTIAL: Good  CLINICAL DECISION MAKING: Evolving/moderate complexity  EVALUATION COMPLEXITY: Moderate  PLAN:  PT FREQUENCY: 1-2x/week (recert)  PT DURATION: 6 weeks + 4 weeks (recert)   PLANNED INTERVENTIONS: 54098- PT Re-evaluation, 97750- Physical Performance Testing, 97110-Therapeutic exercises, 97530- Therapeutic activity, [id number]- Neuromuscular re-education,  60454- Self Care, 09811- Manual therapy, [phone]- Gait training, [phone]- Electrical stimulation (manual), Patient/Family education, Balance training, Stair training, Dry Needling, Joint mobilization, Spinal mobilization, Scar mobilization, Vestibular training, and DME instructions  PLAN FOR NEXT SESSION: Review previous HEP and update prn. Monitor vitals. Trial Bioness on RLE. Ankle stability, dynadiscs, river rocks, cone drill.    Dorell Gatlin E Avantae Bither, PT, DPT 06/14/2023, 12:51 PM

## 2023-06-16 ENCOUNTER — Ambulatory Visit: Admitting: Physical Therapy

## 2023-06-21 ENCOUNTER — Ambulatory Visit: Admitting: Physical Therapy

## 2023-06-22 ENCOUNTER — Ambulatory Visit
Admission: RE | Admit: 2023-06-22 | Discharge: 2023-06-22 | Disposition: A | Discharge status: Home / Self Care | Source: Ambulatory Visit | Attending: Physical Medicine and Rehabilitation | Admitting: Physical Medicine and Rehabilitation

## 2023-06-22 DIAGNOSIS — M4186 Other forms of scoliosis, lumbar region: Secondary | ICD-10-CM | POA: Diagnosis not present

## 2023-06-22 DIAGNOSIS — M5416 Radiculopathy, lumbar region: Secondary | ICD-10-CM

## 2023-06-22 DIAGNOSIS — M5126 Other intervertebral disc displacement, lumbar region: Secondary | ICD-10-CM | POA: Diagnosis not present

## 2023-06-23 ENCOUNTER — Ambulatory Visit: Admitting: Physical Therapy

## 2023-06-28 ENCOUNTER — Ambulatory Visit: Admitting: Physical Therapy

## 2023-06-30 ENCOUNTER — Ambulatory Visit: Admitting: Physical Therapy

## 2023-07-01 DIAGNOSIS — M5416 Radiculopathy, lumbar region: Secondary | ICD-10-CM | POA: Diagnosis not present

## 2023-07-04 DIAGNOSIS — F411 Generalized anxiety disorder: Secondary | ICD-10-CM | POA: Diagnosis not present

## 2023-07-04 DIAGNOSIS — M810 Age-related osteoporosis without current pathological fracture: Secondary | ICD-10-CM | POA: Diagnosis not present

## 2023-07-04 DIAGNOSIS — E7800 Pure hypercholesterolemia, unspecified: Secondary | ICD-10-CM | POA: Diagnosis not present

## 2023-07-04 DIAGNOSIS — E039 Hypothyroidism, unspecified: Secondary | ICD-10-CM | POA: Diagnosis not present

## 2023-07-05 ENCOUNTER — Ambulatory Visit: Admitting: Physical Therapy

## 2023-07-05 ENCOUNTER — Encounter: Payer: Self-pay | Admitting: Physical Therapy

## 2023-07-05 ENCOUNTER — Ambulatory Visit: Payer: Self-pay | Attending: Vascular Surgery | Admitting: Physical Therapy

## 2023-07-05 NOTE — Therapy (Signed)
 [facility] [address] [place], [place], [address] Phone: [phone]   Fax:  [phone]  Patient Details  Name: Cristina Wilson MRN: [id number] Date of Birth: Apr 17, 1950 Referring Provider:  No ref. provider found  Encounter Date: 07/05/2023  PHYSICAL THERAPY DISCHARGE SUMMARY  Visits from Start of Care: 15  Current functional level related to goals / functional outcomes: Pt is mod I w/all ADLs w/strong encouragement to use rollator due to high fall risk    Remaining deficits: High fall risk, RLE sciatica, global weakness    Education / Equipment: HEP   Patient agrees to discharge. Patient goals were not met. Patient is being discharged due to not returning since the last visit.  Called and spoke to pt regarding no-show to today's scheduled PT visit. Pt reports she thought she cancelled all her appointments but obtained a lumbar MRI and is getting an injection to help her sciatica. Pt reports she would like to DC from PT at this time as she does not think it will help her pain any more.    Ermel Verne E Cameren Odwyer, PT, DPT 07/05/2023, 1:35 PM  [facility] [address] [place], [place], [address] Phone: [phone]   Fax:  [phone]

## 2023-07-07 ENCOUNTER — Ambulatory Visit: Admitting: Physical Therapy

## 2023-07-12 ENCOUNTER — Ambulatory Visit: Admitting: Physical Therapy

## 2023-07-12 DIAGNOSIS — M48062 Spinal stenosis, lumbar region with neurogenic claudication: Secondary | ICD-10-CM | POA: Diagnosis not present

## 2023-07-14 ENCOUNTER — Ambulatory Visit: Admitting: Physical Therapy

## 2023-07-29 DIAGNOSIS — H401112 Primary open-angle glaucoma, right eye, moderate stage: Secondary | ICD-10-CM | POA: Diagnosis not present

## 2023-07-29 DIAGNOSIS — H43813 Vitreous degeneration, bilateral: Secondary | ICD-10-CM | POA: Diagnosis not present

## 2023-07-29 DIAGNOSIS — Z961 Presence of intraocular lens: Secondary | ICD-10-CM | POA: Diagnosis not present

## 2023-07-29 DIAGNOSIS — D3132 Benign neoplasm of left choroid: Secondary | ICD-10-CM | POA: Diagnosis not present

## 2023-07-29 DIAGNOSIS — H401121 Primary open-angle glaucoma, left eye, mild stage: Secondary | ICD-10-CM | POA: Diagnosis not present

## 2023-07-29 DIAGNOSIS — G510 Bell's palsy: Secondary | ICD-10-CM | POA: Diagnosis not present

## 2023-07-29 DIAGNOSIS — H35371 Puckering of macula, right eye: Secondary | ICD-10-CM | POA: Diagnosis not present

## 2023-08-04 DIAGNOSIS — E039 Hypothyroidism, unspecified: Secondary | ICD-10-CM | POA: Diagnosis not present

## 2023-08-04 DIAGNOSIS — E7800 Pure hypercholesterolemia, unspecified: Secondary | ICD-10-CM | POA: Diagnosis not present

## 2023-08-04 DIAGNOSIS — M810 Age-related osteoporosis without current pathological fracture: Secondary | ICD-10-CM | POA: Diagnosis not present

## 2023-08-04 DIAGNOSIS — F411 Generalized anxiety disorder: Secondary | ICD-10-CM | POA: Diagnosis not present

## 2023-08-08 DIAGNOSIS — M5416 Radiculopathy, lumbar region: Secondary | ICD-10-CM | POA: Diagnosis not present

## 2023-08-12 DIAGNOSIS — Z23 Encounter for immunization: Secondary | ICD-10-CM | POA: Diagnosis not present

## 2023-08-12 DIAGNOSIS — Z0000 Encounter for general adult medical examination without abnormal findings: Secondary | ICD-10-CM | POA: Diagnosis not present

## 2023-08-12 DIAGNOSIS — Z1331 Encounter for screening for depression: Secondary | ICD-10-CM | POA: Diagnosis not present

## 2023-08-12 DIAGNOSIS — E039 Hypothyroidism, unspecified: Secondary | ICD-10-CM | POA: Diagnosis not present

## 2023-08-15 DIAGNOSIS — N390 Urinary tract infection, site not specified: Secondary | ICD-10-CM | POA: Diagnosis not present

## 2023-09-01 DIAGNOSIS — F325 Major depressive disorder, single episode, in full remission: Secondary | ICD-10-CM | POA: Diagnosis not present

## 2023-09-01 DIAGNOSIS — K219 Gastro-esophageal reflux disease without esophagitis: Secondary | ICD-10-CM | POA: Diagnosis not present

## 2023-09-01 DIAGNOSIS — F5101 Primary insomnia: Secondary | ICD-10-CM | POA: Diagnosis not present

## 2023-09-04 DIAGNOSIS — M810 Age-related osteoporosis without current pathological fracture: Secondary | ICD-10-CM | POA: Diagnosis not present

## 2023-09-04 DIAGNOSIS — E039 Hypothyroidism, unspecified: Secondary | ICD-10-CM | POA: Diagnosis not present

## 2023-09-04 DIAGNOSIS — E7800 Pure hypercholesterolemia, unspecified: Secondary | ICD-10-CM | POA: Diagnosis not present

## 2023-09-04 DIAGNOSIS — F411 Generalized anxiety disorder: Secondary | ICD-10-CM | POA: Diagnosis not present

## 2023-09-07 DIAGNOSIS — L820 Inflamed seborrheic keratosis: Secondary | ICD-10-CM | POA: Diagnosis not present

## 2023-09-14 DIAGNOSIS — Z6829 Body mass index (BMI) 29.0-29.9, adult: Secondary | ICD-10-CM | POA: Diagnosis not present

## 2023-09-14 DIAGNOSIS — M4722 Other spondylosis with radiculopathy, cervical region: Secondary | ICD-10-CM | POA: Diagnosis not present

## 2023-10-04 DIAGNOSIS — E7800 Pure hypercholesterolemia, unspecified: Secondary | ICD-10-CM | POA: Diagnosis not present

## 2023-10-04 DIAGNOSIS — M47816 Spondylosis without myelopathy or radiculopathy, lumbar region: Secondary | ICD-10-CM | POA: Diagnosis not present

## 2023-10-04 DIAGNOSIS — M5416 Radiculopathy, lumbar region: Secondary | ICD-10-CM | POA: Diagnosis not present

## 2023-10-04 DIAGNOSIS — M545 Low back pain, unspecified: Secondary | ICD-10-CM | POA: Diagnosis not present

## 2023-10-04 DIAGNOSIS — F411 Generalized anxiety disorder: Secondary | ICD-10-CM | POA: Diagnosis not present

## 2023-10-04 DIAGNOSIS — M810 Age-related osteoporosis without current pathological fracture: Secondary | ICD-10-CM | POA: Diagnosis not present

## 2023-10-04 DIAGNOSIS — E039 Hypothyroidism, unspecified: Secondary | ICD-10-CM | POA: Diagnosis not present

## 2023-11-03 DIAGNOSIS — K5900 Constipation, unspecified: Secondary | ICD-10-CM | POA: Diagnosis not present

## 2023-11-03 DIAGNOSIS — E663 Overweight: Secondary | ICD-10-CM | POA: Diagnosis not present

## 2023-11-03 DIAGNOSIS — Z23 Encounter for immunization: Secondary | ICD-10-CM | POA: Diagnosis not present

## 2023-11-03 DIAGNOSIS — K219 Gastro-esophageal reflux disease without esophagitis: Secondary | ICD-10-CM | POA: Diagnosis not present

## 2023-11-09 DIAGNOSIS — M47816 Spondylosis without myelopathy or radiculopathy, lumbar region: Secondary | ICD-10-CM | POA: Diagnosis not present

## 2023-11-10 DIAGNOSIS — K5900 Constipation, unspecified: Secondary | ICD-10-CM | POA: Diagnosis not present

## 2023-11-10 DIAGNOSIS — R1031 Right lower quadrant pain: Secondary | ICD-10-CM | POA: Diagnosis not present

## 2023-11-18 DIAGNOSIS — E7800 Pure hypercholesterolemia, unspecified: Secondary | ICD-10-CM | POA: Diagnosis not present

## 2023-11-18 DIAGNOSIS — Z940 Kidney transplant status: Secondary | ICD-10-CM | POA: Diagnosis not present

## 2023-11-18 DIAGNOSIS — K219 Gastro-esophageal reflux disease without esophagitis: Secondary | ICD-10-CM | POA: Diagnosis not present

## 2023-11-18 DIAGNOSIS — Z713 Dietary counseling and surveillance: Secondary | ICD-10-CM | POA: Diagnosis not present

## 2023-11-18 DIAGNOSIS — E039 Hypothyroidism, unspecified: Secondary | ICD-10-CM | POA: Diagnosis not present

## 2023-11-26 DIAGNOSIS — M47816 Spondylosis without myelopathy or radiculopathy, lumbar region: Secondary | ICD-10-CM | POA: Diagnosis not present

## 2023-11-28 DIAGNOSIS — Z940 Kidney transplant status: Secondary | ICD-10-CM | POA: Diagnosis not present

## 2023-11-28 DIAGNOSIS — Z131 Encounter for screening for diabetes mellitus: Secondary | ICD-10-CM | POA: Diagnosis not present

## 2023-11-28 DIAGNOSIS — I10 Essential (primary) hypertension: Secondary | ICD-10-CM | POA: Diagnosis not present

## 2023-11-28 DIAGNOSIS — D849 Immunodeficiency, unspecified: Secondary | ICD-10-CM | POA: Diagnosis not present

## 2023-11-28 DIAGNOSIS — Z713 Dietary counseling and surveillance: Secondary | ICD-10-CM | POA: Diagnosis not present

## 2023-11-28 DIAGNOSIS — R635 Abnormal weight gain: Secondary | ICD-10-CM | POA: Diagnosis not present

## 2023-11-28 DIAGNOSIS — B259 Cytomegaloviral disease, unspecified: Secondary | ICD-10-CM | POA: Diagnosis not present

## 2023-11-28 DIAGNOSIS — Z5181 Encounter for therapeutic drug level monitoring: Secondary | ICD-10-CM | POA: Diagnosis not present

## 2023-11-28 DIAGNOSIS — E7849 Other hyperlipidemia: Secondary | ICD-10-CM | POA: Diagnosis not present

## 2023-11-28 DIAGNOSIS — E039 Hypothyroidism, unspecified: Secondary | ICD-10-CM | POA: Diagnosis not present

## 2023-11-30 ENCOUNTER — Other Ambulatory Visit (HOSPITAL_COMMUNITY): Payer: Self-pay | Admitting: Internal Medicine

## 2023-11-30 DIAGNOSIS — K5909 Other constipation: Secondary | ICD-10-CM

## 2023-11-30 DIAGNOSIS — R1084 Generalized abdominal pain: Secondary | ICD-10-CM

## 2023-12-07 ENCOUNTER — Ambulatory Visit (HOSPITAL_COMMUNITY)
Admission: RE | Admit: 2023-12-07 | Discharge: 2023-12-07 | Disposition: A | Source: Ambulatory Visit | Attending: Internal Medicine | Admitting: Internal Medicine

## 2023-12-07 DIAGNOSIS — K5909 Other constipation: Secondary | ICD-10-CM

## 2023-12-07 DIAGNOSIS — R1084 Generalized abdominal pain: Secondary | ICD-10-CM

## 2023-12-08 ENCOUNTER — Ambulatory Visit (HOSPITAL_COMMUNITY)
Admission: RE | Admit: 2023-12-08 | Discharge: 2023-12-08 | Disposition: A | Discharge status: Home / Self Care | Source: Ambulatory Visit | Attending: Internal Medicine | Admitting: Internal Medicine

## 2023-12-08 DIAGNOSIS — K7689 Other specified diseases of liver: Secondary | ICD-10-CM | POA: Diagnosis not present

## 2023-12-08 DIAGNOSIS — K5730 Diverticulosis of large intestine without perforation or abscess without bleeding: Secondary | ICD-10-CM | POA: Diagnosis not present

## 2023-12-08 DIAGNOSIS — K5909 Other constipation: Secondary | ICD-10-CM | POA: Insufficient documentation

## 2023-12-08 DIAGNOSIS — R1084 Generalized abdominal pain: Secondary | ICD-10-CM | POA: Diagnosis not present

## 2023-12-08 DIAGNOSIS — Z9049 Acquired absence of other specified parts of digestive tract: Secondary | ICD-10-CM | POA: Diagnosis not present
